# Patient Record
Sex: Female | Born: 1935
Health system: Southern US, Community
[De-identification: ages and names within clinical notes are randomized; demographics above are authoritative.]

## PROBLEM LIST (undated history)

## (undated) DIAGNOSIS — I350 Nonrheumatic aortic (valve) stenosis: Secondary | ICD-10-CM

## (undated) DIAGNOSIS — I471 Supraventricular tachycardia, unspecified: Secondary | ICD-10-CM

## (undated) DIAGNOSIS — I38 Endocarditis, valve unspecified: Secondary | ICD-10-CM

## (undated) DIAGNOSIS — C801 Malignant (primary) neoplasm, unspecified: Secondary | ICD-10-CM

## (undated) DIAGNOSIS — R928 Other abnormal and inconclusive findings on diagnostic imaging of breast: Secondary | ICD-10-CM

## (undated) DIAGNOSIS — R0600 Dyspnea, unspecified: Secondary | ICD-10-CM

## (undated) DIAGNOSIS — F329 Major depressive disorder, single episode, unspecified: Secondary | ICD-10-CM

## (undated) DIAGNOSIS — R053 Chronic cough: Secondary | ICD-10-CM

## (undated) DIAGNOSIS — F419 Anxiety disorder, unspecified: Secondary | ICD-10-CM

## (undated) DIAGNOSIS — K219 Gastro-esophageal reflux disease without esophagitis: Secondary | ICD-10-CM

## (undated) DIAGNOSIS — E785 Hyperlipidemia, unspecified: Secondary | ICD-10-CM

## (undated) DIAGNOSIS — Z860100 Personal history of colon polyps, unspecified: Secondary | ICD-10-CM

## (undated) DIAGNOSIS — R05 Cough: Secondary | ICD-10-CM

## (undated) DIAGNOSIS — R55 Syncope and collapse: Secondary | ICD-10-CM

## (undated) DIAGNOSIS — I5030 Unspecified diastolic (congestive) heart failure: Secondary | ICD-10-CM

## (undated) DIAGNOSIS — I10 Essential (primary) hypertension: Secondary | ICD-10-CM

## (undated) DIAGNOSIS — K259 Gastric ulcer, unspecified as acute or chronic, without hemorrhage or perforation: Secondary | ICD-10-CM

## (undated) DIAGNOSIS — I2510 Atherosclerotic heart disease of native coronary artery without angina pectoris: Secondary | ICD-10-CM

## (undated) DIAGNOSIS — I779 Disorder of arteries and arterioles, unspecified: Secondary | ICD-10-CM

## (undated) DIAGNOSIS — E559 Vitamin D deficiency, unspecified: Secondary | ICD-10-CM

## (undated) DIAGNOSIS — M1990 Unspecified osteoarthritis, unspecified site: Secondary | ICD-10-CM

## (undated) DIAGNOSIS — R062 Wheezing: Secondary | ICD-10-CM

## (undated) DIAGNOSIS — J45909 Unspecified asthma, uncomplicated: Secondary | ICD-10-CM

## (undated) DIAGNOSIS — K5790 Diverticulosis of intestine, part unspecified, without perforation or abscess without bleeding: Secondary | ICD-10-CM

## (undated) DIAGNOSIS — H9190 Unspecified hearing loss, unspecified ear: Secondary | ICD-10-CM

## (undated) DIAGNOSIS — D649 Anemia, unspecified: Secondary | ICD-10-CM

## (undated) DIAGNOSIS — F32A Depression, unspecified: Secondary | ICD-10-CM

## (undated) DIAGNOSIS — C786 Secondary malignant neoplasm of retroperitoneum and peritoneum: Secondary | ICD-10-CM

## (undated) DIAGNOSIS — R945 Abnormal results of liver function studies: Secondary | ICD-10-CM

## (undated) DIAGNOSIS — I351 Nonrheumatic aortic (valve) insufficiency: Secondary | ICD-10-CM

## (undated) DIAGNOSIS — R011 Cardiac murmur, unspecified: Secondary | ICD-10-CM

## (undated) DIAGNOSIS — B029 Zoster without complications: Secondary | ICD-10-CM

## (undated) HISTORY — DX: Disorder of arteries and arterioles, unspecified: I77.9

## (undated) HISTORY — DX: Abnormal results of liver function studies: R94.5

## (undated) HISTORY — PX: APPENDECTOMY: SHX54

## (undated) HISTORY — DX: Cardiac murmur, unspecified: R01.1

## (undated) HISTORY — DX: Unspecified diastolic (congestive) heart failure: I50.30

## (undated) HISTORY — DX: Syncope and collapse: R55

## (undated) HISTORY — DX: Depression, unspecified: F32.A

## (undated) HISTORY — DX: Nonrheumatic aortic (valve) stenosis: I35.0

## (undated) HISTORY — DX: Nonrheumatic aortic (valve) insufficiency: I35.1

## (undated) HISTORY — DX: Essential (primary) hypertension: I10

## (undated) HISTORY — DX: Unspecified osteoarthritis, unspecified site: M19.90

## (undated) HISTORY — DX: Supraventricular tachycardia: I47.1

## (undated) HISTORY — DX: Anxiety disorder, unspecified: F41.9

## (undated) HISTORY — DX: Malignant (primary) neoplasm, unspecified: C80.1

## (undated) HISTORY — DX: Anemia, unspecified: D64.9

## (undated) HISTORY — DX: Gastric ulcer, unspecified as acute or chronic, without hemorrhage or perforation: K25.9

## (undated) HISTORY — PX: CORONARY ANGIOPLASTY: SHX604

## (undated) HISTORY — DX: Personal history of colonic polyps: Z86.010

## (undated) HISTORY — DX: Vitamin D deficiency, unspecified: E55.9

## (undated) HISTORY — DX: Zoster without complications: B02.9

## (undated) HISTORY — DX: Secondary malignant neoplasm of retroperitoneum and peritoneum: C78.6

## (undated) HISTORY — DX: Hyperlipidemia, unspecified: E78.5

## (undated) HISTORY — DX: Major depressive disorder, single episode, unspecified: F32.9

## (undated) HISTORY — DX: Personal history of colon polyps, unspecified: Z86.0100

## (undated) HISTORY — DX: Supraventricular tachycardia, unspecified: I47.10

## (undated) HISTORY — DX: Atherosclerotic heart disease of native coronary artery without angina pectoris: I25.10

## (undated) HISTORY — DX: Gastro-esophageal reflux disease without esophagitis: K21.9

## (undated) HISTORY — DX: Endocarditis, valve unspecified: I38

## (undated) HISTORY — DX: Unspecified asthma, uncomplicated: J45.909

## (undated) HISTORY — DX: Other abnormal and inconclusive findings on diagnostic imaging of breast: R92.8

---
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
## 1985-07-28 HISTORY — PX: CHOLECYSTECTOMY: SHX55

## 1986-07-28 HISTORY — PX: LAPAROSCOPIC BILATERAL SALPINGO OOPHERECTOMY: SHX5890

## 1994-07-28 HISTORY — PX: CORONARY ANGIOPLASTY: SHX604

## 1999-07-29 DIAGNOSIS — C786 Secondary malignant neoplasm of retroperitoneum and peritoneum: Secondary | ICD-10-CM

## 1999-07-29 HISTORY — PX: COLECTOMY: SHX59

## 1999-07-29 HISTORY — DX: Secondary malignant neoplasm of retroperitoneum and peritoneum: C78.6

## 1999-07-29 HISTORY — PX: ABDOMINAL HYSTERECTOMY: SHX81

## 2004-04-27 ENCOUNTER — Ambulatory Visit: Payer: Self-pay | Admitting: Oncology

## 2004-05-28 ENCOUNTER — Ambulatory Visit: Payer: Self-pay | Admitting: Oncology

## 2004-07-01 ENCOUNTER — Emergency Department: Payer: Self-pay | Admitting: Emergency Medicine

## 2004-07-01 ENCOUNTER — Other Ambulatory Visit: Payer: Self-pay

## 2004-08-15 ENCOUNTER — Ambulatory Visit: Payer: Self-pay | Admitting: Oncology

## 2004-08-28 ENCOUNTER — Ambulatory Visit: Payer: Self-pay | Admitting: Oncology

## 2004-10-03 ENCOUNTER — Ambulatory Visit: Payer: Self-pay | Admitting: Oncology

## 2004-10-11 ENCOUNTER — Ambulatory Visit: Payer: Self-pay | Admitting: Oncology

## 2004-10-26 ENCOUNTER — Ambulatory Visit: Payer: Self-pay | Admitting: Oncology

## 2005-02-10 ENCOUNTER — Ambulatory Visit: Payer: Self-pay | Admitting: Oncology

## 2005-02-25 ENCOUNTER — Ambulatory Visit: Payer: Self-pay | Admitting: Oncology

## 2005-05-29 ENCOUNTER — Ambulatory Visit: Payer: Self-pay | Admitting: Oncology

## 2005-06-27 ENCOUNTER — Ambulatory Visit: Payer: Self-pay | Admitting: Oncology

## 2005-07-28 DIAGNOSIS — R928 Other abnormal and inconclusive findings on diagnostic imaging of breast: Secondary | ICD-10-CM

## 2005-07-28 HISTORY — DX: Other abnormal and inconclusive findings on diagnostic imaging of breast: R92.8

## 2005-07-28 HISTORY — PX: CARDIAC CATHETERIZATION: SHX172

## 2005-10-02 ENCOUNTER — Ambulatory Visit: Payer: Self-pay | Admitting: Oncology

## 2005-10-26 ENCOUNTER — Ambulatory Visit: Payer: Self-pay | Admitting: Oncology

## 2005-11-25 ENCOUNTER — Ambulatory Visit: Payer: Self-pay | Admitting: Oncology

## 2005-11-25 ENCOUNTER — Ambulatory Visit: Payer: Self-pay | Admitting: Cardiology

## 2006-02-05 ENCOUNTER — Ambulatory Visit: Payer: Self-pay | Admitting: Oncology

## 2006-02-25 ENCOUNTER — Ambulatory Visit: Payer: Self-pay | Admitting: Oncology

## 2006-05-04 ENCOUNTER — Ambulatory Visit: Payer: Self-pay | Admitting: Surgery

## 2006-05-07 ENCOUNTER — Ambulatory Visit: Payer: Self-pay | Admitting: Oncology

## 2006-05-08 ENCOUNTER — Ambulatory Visit: Payer: Self-pay | Admitting: Surgery

## 2006-05-28 ENCOUNTER — Ambulatory Visit: Payer: Self-pay | Admitting: Oncology

## 2006-07-28 DIAGNOSIS — B029 Zoster without complications: Secondary | ICD-10-CM

## 2006-07-28 HISTORY — DX: Zoster without complications: B02.9

## 2006-09-03 ENCOUNTER — Ambulatory Visit: Payer: Self-pay | Admitting: Internal Medicine

## 2006-09-26 ENCOUNTER — Ambulatory Visit: Payer: Self-pay | Admitting: Internal Medicine

## 2006-10-27 ENCOUNTER — Ambulatory Visit: Payer: Self-pay | Admitting: Internal Medicine

## 2006-11-26 ENCOUNTER — Ambulatory Visit: Payer: Self-pay | Admitting: Internal Medicine

## 2006-12-01 ENCOUNTER — Ambulatory Visit: Payer: Self-pay | Admitting: Oncology

## 2006-12-27 ENCOUNTER — Ambulatory Visit: Payer: Self-pay | Admitting: Oncology

## 2006-12-27 ENCOUNTER — Ambulatory Visit: Payer: Self-pay | Admitting: Internal Medicine

## 2007-03-29 ENCOUNTER — Ambulatory Visit: Payer: Self-pay | Admitting: Internal Medicine

## 2007-03-31 ENCOUNTER — Ambulatory Visit: Payer: Self-pay | Admitting: Internal Medicine

## 2007-04-28 ENCOUNTER — Ambulatory Visit: Payer: Self-pay | Admitting: Internal Medicine

## 2007-05-29 ENCOUNTER — Ambulatory Visit: Payer: Self-pay | Admitting: Internal Medicine

## 2007-06-28 ENCOUNTER — Ambulatory Visit: Payer: Self-pay | Admitting: Internal Medicine

## 2007-06-29 ENCOUNTER — Ambulatory Visit: Payer: Self-pay | Admitting: Internal Medicine

## 2007-07-29 ENCOUNTER — Ambulatory Visit: Payer: Self-pay | Admitting: Internal Medicine

## 2007-09-26 ENCOUNTER — Ambulatory Visit: Payer: Self-pay | Admitting: Internal Medicine

## 2007-09-29 ENCOUNTER — Ambulatory Visit: Payer: Self-pay | Admitting: Internal Medicine

## 2007-10-27 ENCOUNTER — Ambulatory Visit: Payer: Self-pay | Admitting: Internal Medicine

## 2007-11-26 ENCOUNTER — Ambulatory Visit: Payer: Self-pay | Admitting: Internal Medicine

## 2007-12-27 ENCOUNTER — Ambulatory Visit: Payer: Self-pay | Admitting: Internal Medicine

## 2007-12-28 ENCOUNTER — Ambulatory Visit: Payer: Self-pay | Admitting: Internal Medicine

## 2008-01-26 ENCOUNTER — Ambulatory Visit: Payer: Self-pay | Admitting: Internal Medicine

## 2008-05-01 ENCOUNTER — Ambulatory Visit: Payer: Self-pay | Admitting: Internal Medicine

## 2008-05-28 ENCOUNTER — Ambulatory Visit: Payer: Self-pay | Admitting: Internal Medicine

## 2008-06-27 ENCOUNTER — Ambulatory Visit: Payer: Self-pay | Admitting: Internal Medicine

## 2008-07-24 ENCOUNTER — Ambulatory Visit: Payer: Self-pay | Admitting: Internal Medicine

## 2008-07-28 ENCOUNTER — Ambulatory Visit: Payer: Self-pay | Admitting: Internal Medicine

## 2008-07-28 DIAGNOSIS — R7989 Other specified abnormal findings of blood chemistry: Secondary | ICD-10-CM

## 2008-07-28 HISTORY — DX: Other specified abnormal findings of blood chemistry: R79.89

## 2008-12-26 ENCOUNTER — Ambulatory Visit: Payer: Self-pay | Admitting: Internal Medicine

## 2008-12-29 ENCOUNTER — Ambulatory Visit: Payer: Self-pay | Admitting: Internal Medicine

## 2009-01-25 ENCOUNTER — Ambulatory Visit: Payer: Self-pay | Admitting: Internal Medicine

## 2009-06-13 ENCOUNTER — Ambulatory Visit: Payer: Self-pay | Admitting: Internal Medicine

## 2009-07-13 ENCOUNTER — Ambulatory Visit: Payer: Self-pay | Admitting: Unknown Physician Specialty

## 2009-09-26 ENCOUNTER — Ambulatory Visit: Payer: Self-pay | Admitting: Unknown Physician Specialty

## 2009-12-26 ENCOUNTER — Ambulatory Visit: Payer: Self-pay | Admitting: Internal Medicine

## 2009-12-28 ENCOUNTER — Ambulatory Visit: Payer: Self-pay | Admitting: Internal Medicine

## 2010-01-25 ENCOUNTER — Ambulatory Visit: Payer: Self-pay | Admitting: Internal Medicine

## 2010-03-28 ENCOUNTER — Ambulatory Visit: Payer: Self-pay | Admitting: Internal Medicine

## 2010-04-04 ENCOUNTER — Ambulatory Visit: Payer: Self-pay | Admitting: Internal Medicine

## 2010-04-08 LAB — CA 125: CA 125: 37.8 U/mL — ABNORMAL HIGH (ref 0.0–34.0)

## 2010-04-27 ENCOUNTER — Ambulatory Visit: Payer: Self-pay | Admitting: Internal Medicine

## 2010-10-03 ENCOUNTER — Ambulatory Visit: Payer: Self-pay | Admitting: Internal Medicine

## 2010-10-04 LAB — CA 125: CA 125: 35.2 U/mL — ABNORMAL HIGH (ref 0.0–34.0)

## 2010-10-27 ENCOUNTER — Ambulatory Visit: Payer: Self-pay | Admitting: Internal Medicine

## 2011-01-20 ENCOUNTER — Ambulatory Visit: Payer: Self-pay | Admitting: Internal Medicine

## 2011-07-15 ENCOUNTER — Ambulatory Visit: Payer: Self-pay | Admitting: Cardiology

## 2011-08-08 DIAGNOSIS — I2510 Atherosclerotic heart disease of native coronary artery without angina pectoris: Secondary | ICD-10-CM | POA: Diagnosis not present

## 2011-08-08 DIAGNOSIS — E559 Vitamin D deficiency, unspecified: Secondary | ICD-10-CM | POA: Diagnosis not present

## 2011-08-08 DIAGNOSIS — I10 Essential (primary) hypertension: Secondary | ICD-10-CM | POA: Diagnosis not present

## 2011-08-08 DIAGNOSIS — E785 Hyperlipidemia, unspecified: Secondary | ICD-10-CM | POA: Diagnosis not present

## 2011-08-12 ENCOUNTER — Ambulatory Visit: Payer: Self-pay | Admitting: Unknown Physician Specialty

## 2011-08-12 DIAGNOSIS — K5730 Diverticulosis of large intestine without perforation or abscess without bleeding: Secondary | ICD-10-CM | POA: Diagnosis not present

## 2011-08-12 DIAGNOSIS — Z7982 Long term (current) use of aspirin: Secondary | ICD-10-CM | POA: Diagnosis not present

## 2011-08-12 DIAGNOSIS — D649 Anemia, unspecified: Secondary | ICD-10-CM | POA: Diagnosis not present

## 2011-08-12 DIAGNOSIS — Z8711 Personal history of peptic ulcer disease: Secondary | ICD-10-CM | POA: Diagnosis not present

## 2011-08-12 DIAGNOSIS — E559 Vitamin D deficiency, unspecified: Secondary | ICD-10-CM | POA: Diagnosis not present

## 2011-08-12 DIAGNOSIS — Z980 Intestinal bypass and anastomosis status: Secondary | ICD-10-CM | POA: Diagnosis not present

## 2011-08-12 DIAGNOSIS — Z808 Family history of malignant neoplasm of other organs or systems: Secondary | ICD-10-CM | POA: Diagnosis not present

## 2011-08-12 DIAGNOSIS — C786 Secondary malignant neoplasm of retroperitoneum and peritoneum: Secondary | ICD-10-CM | POA: Diagnosis not present

## 2011-08-12 DIAGNOSIS — K633 Ulcer of intestine: Secondary | ICD-10-CM | POA: Diagnosis not present

## 2011-08-12 DIAGNOSIS — Z86010 Personal history of colonic polyps: Secondary | ICD-10-CM | POA: Diagnosis not present

## 2011-08-12 DIAGNOSIS — J45909 Unspecified asthma, uncomplicated: Secondary | ICD-10-CM | POA: Diagnosis not present

## 2011-08-12 DIAGNOSIS — C569 Malignant neoplasm of unspecified ovary: Secondary | ICD-10-CM | POA: Diagnosis not present

## 2011-08-12 DIAGNOSIS — D509 Iron deficiency anemia, unspecified: Secondary | ICD-10-CM | POA: Diagnosis not present

## 2011-08-12 DIAGNOSIS — Z8041 Family history of malignant neoplasm of ovary: Secondary | ICD-10-CM | POA: Diagnosis not present

## 2011-08-12 DIAGNOSIS — I2510 Atherosclerotic heart disease of native coronary artery without angina pectoris: Secondary | ICD-10-CM | POA: Diagnosis not present

## 2011-08-12 DIAGNOSIS — Z79899 Other long term (current) drug therapy: Secondary | ICD-10-CM | POA: Diagnosis not present

## 2011-08-12 DIAGNOSIS — K2980 Duodenitis without bleeding: Secondary | ICD-10-CM | POA: Diagnosis not present

## 2011-08-12 DIAGNOSIS — K648 Other hemorrhoids: Secondary | ICD-10-CM | POA: Diagnosis not present

## 2011-08-12 DIAGNOSIS — I10 Essential (primary) hypertension: Secondary | ICD-10-CM | POA: Diagnosis not present

## 2011-08-12 DIAGNOSIS — K649 Unspecified hemorrhoids: Secondary | ICD-10-CM | POA: Diagnosis not present

## 2011-08-12 DIAGNOSIS — K644 Residual hemorrhoidal skin tags: Secondary | ICD-10-CM | POA: Diagnosis not present

## 2011-08-12 DIAGNOSIS — E785 Hyperlipidemia, unspecified: Secondary | ICD-10-CM | POA: Diagnosis not present

## 2011-08-12 DIAGNOSIS — K449 Diaphragmatic hernia without obstruction or gangrene: Secondary | ICD-10-CM | POA: Diagnosis not present

## 2011-08-12 LAB — HM COLONOSCOPY: HM Colonoscopy: NORMAL

## 2011-08-18 LAB — PATHOLOGY REPORT

## 2011-09-01 DIAGNOSIS — H2510 Age-related nuclear cataract, unspecified eye: Secondary | ICD-10-CM | POA: Diagnosis not present

## 2011-10-02 ENCOUNTER — Ambulatory Visit: Payer: Self-pay | Admitting: Oncology

## 2011-10-02 DIAGNOSIS — I10 Essential (primary) hypertension: Secondary | ICD-10-CM | POA: Diagnosis not present

## 2011-10-02 DIAGNOSIS — Z9221 Personal history of antineoplastic chemotherapy: Secondary | ICD-10-CM | POA: Diagnosis not present

## 2011-10-02 DIAGNOSIS — Z79899 Other long term (current) drug therapy: Secondary | ICD-10-CM | POA: Diagnosis not present

## 2011-10-02 DIAGNOSIS — Z8509 Personal history of malignant neoplasm of other digestive organs: Secondary | ICD-10-CM | POA: Diagnosis not present

## 2011-10-02 DIAGNOSIS — R971 Elevated cancer antigen 125 [CA 125]: Secondary | ICD-10-CM | POA: Diagnosis not present

## 2011-10-02 LAB — CBC CANCER CENTER
Basophil #: 0 x10 3/mm (ref 0.0–0.1)
Basophil %: 0.7 %
Eosinophil #: 0.3 x10 3/mm (ref 0.0–0.7)
Eosinophil %: 6.8 %
HCT: 34.9 % — ABNORMAL LOW (ref 35.0–47.0)
HGB: 11.9 g/dL — ABNORMAL LOW (ref 12.0–16.0)
Lymphocyte #: 1.7 x10 3/mm (ref 1.0–3.6)
Lymphocyte %: 44.1 %
MCH: 31.7 pg (ref 26.0–34.0)
MCHC: 34 g/dL (ref 32.0–36.0)
MCV: 93 fL (ref 80–100)
Monocyte #: 0.2 x10 3/mm (ref 0.0–0.7)
Monocyte %: 6.3 %
Neutrophil #: 1.7 x10 3/mm (ref 1.4–6.5)
Neutrophil %: 42.1 %
Platelet: 176 x10 3/mm (ref 150–440)
RBC: 3.75 10*6/uL — ABNORMAL LOW (ref 3.80–5.20)
RDW: 13.2 % (ref 11.5–14.5)
WBC: 4 x10 3/mm (ref 3.6–11.0)

## 2011-10-02 LAB — COMPREHENSIVE METABOLIC PANEL
Albumin: 4 g/dL (ref 3.4–5.0)
Alkaline Phosphatase: 43 U/L — ABNORMAL LOW (ref 50–136)
Anion Gap: 9 (ref 7–16)
BUN: 14 mg/dL (ref 7–18)
Bilirubin,Total: 0.3 mg/dL (ref 0.2–1.0)
Calcium, Total: 9.5 mg/dL (ref 8.5–10.1)
Chloride: 102 mmol/L (ref 98–107)
Co2: 29 mmol/L (ref 21–32)
Creatinine: 1.01 mg/dL (ref 0.60–1.30)
EGFR (African American): >60
EGFR (Non-African Amer.): 57 — ABNORMAL LOW
Glucose: 94 mg/dL (ref 65–99)
Osmolality: 280 (ref 275–301)
Potassium: 4.4 mmol/L (ref 3.5–5.1)
SGOT(AST): 36 U/L (ref 15–37)
SGPT (ALT): 33 U/L
Sodium: 140 mmol/L (ref 136–145)
Total Protein: 8 g/dL (ref 6.4–8.2)

## 2011-10-03 LAB — CA 125: CA 125: 27.2 U/mL (ref 0.0–34.0)

## 2011-10-20 DIAGNOSIS — Z85828 Personal history of other malignant neoplasm of skin: Secondary | ICD-10-CM | POA: Diagnosis not present

## 2011-10-20 DIAGNOSIS — L821 Other seborrheic keratosis: Secondary | ICD-10-CM | POA: Diagnosis not present

## 2011-10-20 DIAGNOSIS — L820 Inflamed seborrheic keratosis: Secondary | ICD-10-CM | POA: Diagnosis not present

## 2011-10-20 DIAGNOSIS — L570 Actinic keratosis: Secondary | ICD-10-CM | POA: Diagnosis not present

## 2011-10-27 ENCOUNTER — Ambulatory Visit: Payer: Self-pay | Admitting: Oncology

## 2011-10-30 DIAGNOSIS — E782 Mixed hyperlipidemia: Secondary | ICD-10-CM | POA: Diagnosis not present

## 2011-10-30 DIAGNOSIS — I10 Essential (primary) hypertension: Secondary | ICD-10-CM | POA: Diagnosis not present

## 2011-10-30 DIAGNOSIS — I2510 Atherosclerotic heart disease of native coronary artery without angina pectoris: Secondary | ICD-10-CM | POA: Diagnosis not present

## 2011-12-02 DIAGNOSIS — D649 Anemia, unspecified: Secondary | ICD-10-CM | POA: Diagnosis not present

## 2011-12-02 DIAGNOSIS — E559 Vitamin D deficiency, unspecified: Secondary | ICD-10-CM | POA: Diagnosis not present

## 2011-12-02 DIAGNOSIS — I2510 Atherosclerotic heart disease of native coronary artery without angina pectoris: Secondary | ICD-10-CM | POA: Diagnosis not present

## 2011-12-02 DIAGNOSIS — I10 Essential (primary) hypertension: Secondary | ICD-10-CM | POA: Diagnosis not present

## 2011-12-09 DIAGNOSIS — R1013 Epigastric pain: Secondary | ICD-10-CM | POA: Diagnosis not present

## 2011-12-09 DIAGNOSIS — K3189 Other diseases of stomach and duodenum: Secondary | ICD-10-CM | POA: Diagnosis not present

## 2011-12-09 DIAGNOSIS — I2510 Atherosclerotic heart disease of native coronary artery without angina pectoris: Secondary | ICD-10-CM | POA: Diagnosis not present

## 2011-12-09 DIAGNOSIS — I10 Essential (primary) hypertension: Secondary | ICD-10-CM | POA: Diagnosis not present

## 2011-12-09 DIAGNOSIS — E785 Hyperlipidemia, unspecified: Secondary | ICD-10-CM | POA: Diagnosis not present

## 2012-01-21 ENCOUNTER — Ambulatory Visit: Payer: Self-pay | Admitting: Internal Medicine

## 2012-01-21 DIAGNOSIS — Z1231 Encounter for screening mammogram for malignant neoplasm of breast: Secondary | ICD-10-CM | POA: Diagnosis not present

## 2012-02-12 DIAGNOSIS — K3189 Other diseases of stomach and duodenum: Secondary | ICD-10-CM | POA: Diagnosis not present

## 2012-02-12 DIAGNOSIS — I2510 Atherosclerotic heart disease of native coronary artery without angina pectoris: Secondary | ICD-10-CM | POA: Diagnosis not present

## 2012-02-12 DIAGNOSIS — I10 Essential (primary) hypertension: Secondary | ICD-10-CM | POA: Diagnosis not present

## 2012-02-12 DIAGNOSIS — E559 Vitamin D deficiency, unspecified: Secondary | ICD-10-CM | POA: Diagnosis not present

## 2012-02-26 DIAGNOSIS — N390 Urinary tract infection, site not specified: Secondary | ICD-10-CM | POA: Diagnosis not present

## 2012-02-27 DIAGNOSIS — N390 Urinary tract infection, site not specified: Secondary | ICD-10-CM | POA: Diagnosis not present

## 2012-03-10 DIAGNOSIS — J310 Chronic rhinitis: Secondary | ICD-10-CM | POA: Diagnosis not present

## 2012-03-10 DIAGNOSIS — J45909 Unspecified asthma, uncomplicated: Secondary | ICD-10-CM | POA: Diagnosis not present

## 2012-04-13 DIAGNOSIS — Z23 Encounter for immunization: Secondary | ICD-10-CM | POA: Diagnosis not present

## 2012-05-10 DIAGNOSIS — L819 Disorder of pigmentation, unspecified: Secondary | ICD-10-CM | POA: Diagnosis not present

## 2012-05-10 DIAGNOSIS — Z85828 Personal history of other malignant neoplasm of skin: Secondary | ICD-10-CM | POA: Diagnosis not present

## 2012-05-10 DIAGNOSIS — L821 Other seborrheic keratosis: Secondary | ICD-10-CM | POA: Diagnosis not present

## 2012-05-10 DIAGNOSIS — L570 Actinic keratosis: Secondary | ICD-10-CM | POA: Diagnosis not present

## 2012-05-20 DIAGNOSIS — I10 Essential (primary) hypertension: Secondary | ICD-10-CM | POA: Diagnosis not present

## 2012-05-20 DIAGNOSIS — I2510 Atherosclerotic heart disease of native coronary artery without angina pectoris: Secondary | ICD-10-CM | POA: Diagnosis not present

## 2012-07-15 DIAGNOSIS — I10 Essential (primary) hypertension: Secondary | ICD-10-CM | POA: Diagnosis not present

## 2012-07-15 DIAGNOSIS — I2510 Atherosclerotic heart disease of native coronary artery without angina pectoris: Secondary | ICD-10-CM | POA: Diagnosis not present

## 2012-07-15 DIAGNOSIS — E559 Vitamin D deficiency, unspecified: Secondary | ICD-10-CM | POA: Diagnosis not present

## 2012-07-22 DIAGNOSIS — I2510 Atherosclerotic heart disease of native coronary artery without angina pectoris: Secondary | ICD-10-CM | POA: Diagnosis not present

## 2012-07-22 DIAGNOSIS — K3189 Other diseases of stomach and duodenum: Secondary | ICD-10-CM | POA: Diagnosis not present

## 2012-07-22 DIAGNOSIS — R1013 Epigastric pain: Secondary | ICD-10-CM | POA: Diagnosis not present

## 2012-07-22 DIAGNOSIS — I10 Essential (primary) hypertension: Secondary | ICD-10-CM | POA: Diagnosis not present

## 2012-07-22 DIAGNOSIS — E559 Vitamin D deficiency, unspecified: Secondary | ICD-10-CM | POA: Diagnosis not present

## 2012-08-05 DIAGNOSIS — I25810 Atherosclerosis of coronary artery bypass graft(s) without angina pectoris: Secondary | ICD-10-CM | POA: Diagnosis not present

## 2012-09-22 DIAGNOSIS — I2510 Atherosclerotic heart disease of native coronary artery without angina pectoris: Secondary | ICD-10-CM | POA: Diagnosis not present

## 2012-10-07 DIAGNOSIS — H16229 Keratoconjunctivitis sicca, not specified as Sjogren's, unspecified eye: Secondary | ICD-10-CM | POA: Diagnosis not present

## 2012-10-08 DIAGNOSIS — J45909 Unspecified asthma, uncomplicated: Secondary | ICD-10-CM | POA: Diagnosis not present

## 2012-10-08 DIAGNOSIS — J309 Allergic rhinitis, unspecified: Secondary | ICD-10-CM | POA: Diagnosis not present

## 2012-10-28 DIAGNOSIS — I359 Nonrheumatic aortic valve disorder, unspecified: Secondary | ICD-10-CM | POA: Diagnosis not present

## 2012-10-28 DIAGNOSIS — R42 Dizziness and giddiness: Secondary | ICD-10-CM | POA: Diagnosis not present

## 2012-10-28 DIAGNOSIS — I2510 Atherosclerotic heart disease of native coronary artery without angina pectoris: Secondary | ICD-10-CM | POA: Diagnosis not present

## 2013-01-19 DIAGNOSIS — C569 Malignant neoplasm of unspecified ovary: Secondary | ICD-10-CM | POA: Diagnosis not present

## 2013-01-19 DIAGNOSIS — I2510 Atherosclerotic heart disease of native coronary artery without angina pectoris: Secondary | ICD-10-CM | POA: Diagnosis not present

## 2013-01-19 DIAGNOSIS — I10 Essential (primary) hypertension: Secondary | ICD-10-CM | POA: Diagnosis not present

## 2013-01-19 DIAGNOSIS — D649 Anemia, unspecified: Secondary | ICD-10-CM | POA: Diagnosis not present

## 2013-01-26 DIAGNOSIS — K3189 Other diseases of stomach and duodenum: Secondary | ICD-10-CM | POA: Diagnosis not present

## 2013-01-26 DIAGNOSIS — I2510 Atherosclerotic heart disease of native coronary artery without angina pectoris: Secondary | ICD-10-CM | POA: Diagnosis not present

## 2013-01-26 DIAGNOSIS — R1013 Epigastric pain: Secondary | ICD-10-CM | POA: Diagnosis not present

## 2013-01-26 DIAGNOSIS — E559 Vitamin D deficiency, unspecified: Secondary | ICD-10-CM | POA: Diagnosis not present

## 2013-01-26 DIAGNOSIS — I10 Essential (primary) hypertension: Secondary | ICD-10-CM | POA: Diagnosis not present

## 2013-01-31 ENCOUNTER — Encounter: Payer: Self-pay | Admitting: Internal Medicine

## 2013-01-31 DIAGNOSIS — R262 Difficulty in walking, not elsewhere classified: Secondary | ICD-10-CM | POA: Diagnosis not present

## 2013-01-31 DIAGNOSIS — IMO0001 Reserved for inherently not codable concepts without codable children: Secondary | ICD-10-CM | POA: Diagnosis not present

## 2013-01-31 DIAGNOSIS — R279 Unspecified lack of coordination: Secondary | ICD-10-CM | POA: Diagnosis not present

## 2013-02-22 DIAGNOSIS — Z85828 Personal history of other malignant neoplasm of skin: Secondary | ICD-10-CM | POA: Diagnosis not present

## 2013-02-22 DIAGNOSIS — L821 Other seborrheic keratosis: Secondary | ICD-10-CM | POA: Diagnosis not present

## 2013-02-22 DIAGNOSIS — L570 Actinic keratosis: Secondary | ICD-10-CM | POA: Diagnosis not present

## 2013-02-22 DIAGNOSIS — L578 Other skin changes due to chronic exposure to nonionizing radiation: Secondary | ICD-10-CM | POA: Diagnosis not present

## 2013-02-22 DIAGNOSIS — L820 Inflamed seborrheic keratosis: Secondary | ICD-10-CM | POA: Diagnosis not present

## 2013-02-22 DIAGNOSIS — D1800 Hemangioma unspecified site: Secondary | ICD-10-CM | POA: Diagnosis not present

## 2013-02-22 DIAGNOSIS — L819 Disorder of pigmentation, unspecified: Secondary | ICD-10-CM | POA: Diagnosis not present

## 2013-03-10 DIAGNOSIS — J310 Chronic rhinitis: Secondary | ICD-10-CM | POA: Diagnosis not present

## 2013-03-10 DIAGNOSIS — J45909 Unspecified asthma, uncomplicated: Secondary | ICD-10-CM | POA: Diagnosis not present

## 2013-04-08 DIAGNOSIS — Z23 Encounter for immunization: Secondary | ICD-10-CM | POA: Diagnosis not present

## 2013-05-16 DIAGNOSIS — E782 Mixed hyperlipidemia: Secondary | ICD-10-CM | POA: Diagnosis not present

## 2013-05-16 DIAGNOSIS — I10 Essential (primary) hypertension: Secondary | ICD-10-CM | POA: Diagnosis not present

## 2013-05-16 DIAGNOSIS — I2510 Atherosclerotic heart disease of native coronary artery without angina pectoris: Secondary | ICD-10-CM | POA: Diagnosis not present

## 2013-07-29 DIAGNOSIS — E559 Vitamin D deficiency, unspecified: Secondary | ICD-10-CM | POA: Diagnosis not present

## 2013-07-29 DIAGNOSIS — I2510 Atherosclerotic heart disease of native coronary artery without angina pectoris: Secondary | ICD-10-CM | POA: Diagnosis not present

## 2013-07-29 DIAGNOSIS — I10 Essential (primary) hypertension: Secondary | ICD-10-CM | POA: Diagnosis not present

## 2013-08-05 DIAGNOSIS — I2510 Atherosclerotic heart disease of native coronary artery without angina pectoris: Secondary | ICD-10-CM | POA: Diagnosis not present

## 2013-08-05 DIAGNOSIS — E782 Mixed hyperlipidemia: Secondary | ICD-10-CM | POA: Diagnosis not present

## 2013-08-05 DIAGNOSIS — Z0000 Encounter for general adult medical examination without abnormal findings: Secondary | ICD-10-CM | POA: Diagnosis not present

## 2013-08-05 DIAGNOSIS — E559 Vitamin D deficiency, unspecified: Secondary | ICD-10-CM | POA: Diagnosis not present

## 2013-08-05 DIAGNOSIS — Z124 Encounter for screening for malignant neoplasm of cervix: Secondary | ICD-10-CM | POA: Diagnosis not present

## 2013-08-05 DIAGNOSIS — I10 Essential (primary) hypertension: Secondary | ICD-10-CM | POA: Diagnosis not present

## 2013-08-10 ENCOUNTER — Ambulatory Visit: Payer: Self-pay | Admitting: Internal Medicine

## 2013-08-10 DIAGNOSIS — Z1231 Encounter for screening mammogram for malignant neoplasm of breast: Secondary | ICD-10-CM | POA: Diagnosis not present

## 2013-08-10 LAB — HM MAMMOGRAPHY: HM Mammogram: NEGATIVE

## 2013-08-19 DIAGNOSIS — M949 Disorder of cartilage, unspecified: Secondary | ICD-10-CM | POA: Diagnosis not present

## 2013-08-19 DIAGNOSIS — M899 Disorder of bone, unspecified: Secondary | ICD-10-CM | POA: Diagnosis not present

## 2013-08-30 DIAGNOSIS — D1800 Hemangioma unspecified site: Secondary | ICD-10-CM | POA: Diagnosis not present

## 2013-08-30 DIAGNOSIS — Z85828 Personal history of other malignant neoplasm of skin: Secondary | ICD-10-CM | POA: Diagnosis not present

## 2013-08-30 DIAGNOSIS — L819 Disorder of pigmentation, unspecified: Secondary | ICD-10-CM | POA: Diagnosis not present

## 2013-08-30 DIAGNOSIS — L821 Other seborrheic keratosis: Secondary | ICD-10-CM | POA: Diagnosis not present

## 2013-08-30 DIAGNOSIS — L820 Inflamed seborrheic keratosis: Secondary | ICD-10-CM | POA: Diagnosis not present

## 2013-09-05 DIAGNOSIS — I10 Essential (primary) hypertension: Secondary | ICD-10-CM | POA: Diagnosis not present

## 2013-09-05 DIAGNOSIS — I2510 Atherosclerotic heart disease of native coronary artery without angina pectoris: Secondary | ICD-10-CM | POA: Diagnosis not present

## 2013-09-05 DIAGNOSIS — R002 Palpitations: Secondary | ICD-10-CM | POA: Diagnosis not present

## 2013-10-06 DIAGNOSIS — J449 Chronic obstructive pulmonary disease, unspecified: Secondary | ICD-10-CM | POA: Diagnosis not present

## 2013-10-06 DIAGNOSIS — R05 Cough: Secondary | ICD-10-CM | POA: Diagnosis not present

## 2013-10-06 DIAGNOSIS — R059 Cough, unspecified: Secondary | ICD-10-CM | POA: Diagnosis not present

## 2013-10-07 DIAGNOSIS — I2510 Atherosclerotic heart disease of native coronary artery without angina pectoris: Secondary | ICD-10-CM | POA: Diagnosis not present

## 2013-10-07 DIAGNOSIS — I10 Essential (primary) hypertension: Secondary | ICD-10-CM | POA: Diagnosis not present

## 2013-10-07 DIAGNOSIS — R5381 Other malaise: Secondary | ICD-10-CM | POA: Diagnosis not present

## 2013-10-07 DIAGNOSIS — D649 Anemia, unspecified: Secondary | ICD-10-CM | POA: Diagnosis not present

## 2013-10-08 ENCOUNTER — Emergency Department: Payer: Self-pay | Admitting: Emergency Medicine

## 2013-10-08 DIAGNOSIS — F411 Generalized anxiety disorder: Secondary | ICD-10-CM | POA: Diagnosis not present

## 2013-10-08 DIAGNOSIS — E785 Hyperlipidemia, unspecified: Secondary | ICD-10-CM | POA: Diagnosis not present

## 2013-10-08 DIAGNOSIS — Z9089 Acquired absence of other organs: Secondary | ICD-10-CM | POA: Diagnosis not present

## 2013-10-08 DIAGNOSIS — R52 Pain, unspecified: Secondary | ICD-10-CM | POA: Diagnosis not present

## 2013-10-08 DIAGNOSIS — I10 Essential (primary) hypertension: Secondary | ICD-10-CM | POA: Diagnosis not present

## 2013-10-08 DIAGNOSIS — Z90710 Acquired absence of both cervix and uterus: Secondary | ICD-10-CM | POA: Diagnosis not present

## 2013-10-08 DIAGNOSIS — Z79899 Other long term (current) drug therapy: Secondary | ICD-10-CM | POA: Diagnosis not present

## 2013-10-08 DIAGNOSIS — R079 Chest pain, unspecified: Secondary | ICD-10-CM | POA: Diagnosis not present

## 2013-10-08 LAB — BASIC METABOLIC PANEL
Anion Gap: 8 (ref 7–16)
BUN: 15 mg/dL (ref 7–18)
Calcium, Total: 9.2 mg/dL (ref 8.5–10.1)
Chloride: 102 mmol/L (ref 98–107)
Co2: 26 mmol/L (ref 21–32)
Creatinine: 0.72 mg/dL (ref 0.60–1.30)
EGFR (African American): >60
EGFR (Non-African Amer.): >60
Glucose: 101 mg/dL — ABNORMAL HIGH (ref 65–99)
Osmolality: 273 (ref 275–301)
Potassium: 3.9 mmol/L (ref 3.5–5.1)
Sodium: 136 mmol/L (ref 136–145)

## 2013-10-08 LAB — CBC
HCT: 36.5 % (ref 35.0–47.0)
HGB: 12.4 g/dL (ref 12.0–16.0)
MCH: 31.3 pg (ref 26.0–34.0)
MCHC: 33.8 g/dL (ref 32.0–36.0)
MCV: 93 fL (ref 80–100)
Platelet: 154 10*3/uL (ref 150–440)
RBC: 3.95 10*6/uL (ref 3.80–5.20)
RDW: 13.3 % (ref 11.5–14.5)
WBC: 6.2 10*3/uL (ref 3.6–11.0)

## 2013-10-08 LAB — TROPONIN I: Troponin-I: <0.02 ng/mL

## 2013-10-09 LAB — TROPONIN I: Troponin-I: <0.02 ng/mL

## 2013-10-10 DIAGNOSIS — I2510 Atherosclerotic heart disease of native coronary artery without angina pectoris: Secondary | ICD-10-CM | POA: Diagnosis not present

## 2013-10-10 DIAGNOSIS — R0789 Other chest pain: Secondary | ICD-10-CM | POA: Diagnosis not present

## 2013-10-10 DIAGNOSIS — I10 Essential (primary) hypertension: Secondary | ICD-10-CM | POA: Diagnosis not present

## 2013-10-10 DIAGNOSIS — I359 Nonrheumatic aortic valve disorder, unspecified: Secondary | ICD-10-CM | POA: Diagnosis not present

## 2013-10-19 DIAGNOSIS — I359 Nonrheumatic aortic valve disorder, unspecified: Secondary | ICD-10-CM | POA: Diagnosis not present

## 2013-11-03 DIAGNOSIS — R5383 Other fatigue: Secondary | ICD-10-CM | POA: Diagnosis not present

## 2013-11-03 DIAGNOSIS — I10 Essential (primary) hypertension: Secondary | ICD-10-CM | POA: Diagnosis not present

## 2013-11-03 DIAGNOSIS — R5381 Other malaise: Secondary | ICD-10-CM | POA: Diagnosis not present

## 2013-11-03 DIAGNOSIS — I2510 Atherosclerotic heart disease of native coronary artery without angina pectoris: Secondary | ICD-10-CM | POA: Diagnosis not present

## 2013-11-10 DIAGNOSIS — I2510 Atherosclerotic heart disease of native coronary artery without angina pectoris: Secondary | ICD-10-CM | POA: Diagnosis not present

## 2013-11-10 DIAGNOSIS — E559 Vitamin D deficiency, unspecified: Secondary | ICD-10-CM | POA: Diagnosis not present

## 2013-11-10 DIAGNOSIS — I10 Essential (primary) hypertension: Secondary | ICD-10-CM | POA: Diagnosis not present

## 2013-11-10 DIAGNOSIS — D649 Anemia, unspecified: Secondary | ICD-10-CM | POA: Diagnosis not present

## 2013-11-11 ENCOUNTER — Encounter (INDEPENDENT_AMBULATORY_CARE_PROVIDER_SITE_OTHER): Payer: Self-pay

## 2013-11-11 ENCOUNTER — Ambulatory Visit (INDEPENDENT_AMBULATORY_CARE_PROVIDER_SITE_OTHER): Payer: Medicare Other | Admitting: Adult Health

## 2013-11-11 ENCOUNTER — Encounter: Payer: Self-pay | Admitting: Adult Health

## 2013-11-11 VITALS — BP 122/64 | HR 60 | Temp 98.2°F | Resp 14 | Ht 59.5 in | Wt 125.0 lb

## 2013-11-11 DIAGNOSIS — F411 Generalized anxiety disorder: Secondary | ICD-10-CM

## 2013-11-11 DIAGNOSIS — E559 Vitamin D deficiency, unspecified: Secondary | ICD-10-CM | POA: Diagnosis not present

## 2013-11-11 DIAGNOSIS — F419 Anxiety disorder, unspecified: Secondary | ICD-10-CM

## 2013-11-11 MED ORDER — BUSPIRONE HCL 5 MG PO TABS: 5.0000 mg | ORAL_TABLET | Freq: Two times a day (BID) | ORAL | Status: DC

## 2013-11-11 NOTE — Patient Instructions (Signed)
   Thank you for choosing [facility] for your health care needs.  I will request your records from Dr. Caryl Comes and Dr. Josefa Half  Start Buspar 5 mg twice a day. This is for anxiety.   We can increase your Celexa to 40 mg daily to help with depression.

## 2013-11-11 NOTE — Progress Notes (Signed)
Patient ID: Amy Morton, female   DOB: April 09, 1935, 79 y.o.   MRN: [id number]    Subjective:    Patient ID: Amy Morton, female    DOB: 1935-04-07, 79 y.o.   MRN: [id number]  HPI  Pt is a pleasant 79 y/o female who presents to establish care. Previously follow by Dr. Caryl Comes and Dr. Saralyn Pilar. Will request records. She is feeling well overall except for ongoing anxiety. She is on celexa and reports her symptoms have improved although the anxiety persists. She has been prescribed abilify in the past but was not able to tolerate secondary to insomnia. Report having her Medicare wellness exam in January including labs. She reports having a hx of vitamin d deficiency. She has been taking OTC supplements.  [name] reports going to the ED in March for chest pain and elevated blood pressure. She believes it was directly related to taking the abilify. I will request those records.    Past Medical History  Diagnosis Date  . Anxiety   . Arthritis   . Cancer 2001    Peritoneal - Dr. Oliva Bustard  . Depression   . GERD (gastroesophageal reflux disease)   . Hyperlipidemia   . Hypertension   . Ulcer      Past Surgical History  Procedure Laterality Date  . Tonsil removed Bilateral 1950  . Abdominal hysterectomy  1966  . Cholecystectomy  1987  . Angioplasty  1996  . Colon surgery  feb 2001    2nd march 2001  . Laparoscopic bilateral salpingo oopherectomy  1988    Dr. Laverta Baltimore     Family History  Problem Relation Age of Onset  . Hypertension Mother   . Stroke Mother 27    Cerebral Hemorrhage  . Cancer Father     bone cancer  . Diabetes Sister   . Diabetes Brother   . Heart disease Brother   . Cancer Daughter 52    Ovarian cancer     History   Social History  . Marital Status: Widowed    Spouse Name: N/A    Number of Children: 3  . Years of Education: N/A   Occupational History  . [employer]     Retired  . 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 Smoking status: Never Smoker   . Smokeless tobacco: Not on file  . Alcohol Use: No  . Drug Use: No  . Sexual Activity: Not on file   Other Topics Concern  . Not on file   Social History Narrative   Pt lives in [place] by herself. She is widowed and has a daughter [name]), son [name]). She also had a son that was killed in a work related accident [name] - died age).       Caffeine - none   Exercise - walking     Physical exam 08/05/13 Bone density - osteopenia Chest xray 10/06/13 - Echo 10/19/13 ED visit chest pain, elevated b/p 10/08/13  Review of Systems  Constitutional: Negative.   HENT: Negative.   Eyes: Negative.   Respiratory: Negative.   Cardiovascular: Negative.   Gastrointestinal: Negative.   Endocrine: Negative.   Genitourinary: Negative.   Musculoskeletal: Negative.   Skin: Negative.   Allergic/Immunologic: Negative.   Neurological: Negative.   Hematological: Negative.   Psychiatric/Behavioral: Negative for behavioral problems, confusion, sleep disturbance, dysphoric mood, decreased concentration and agitation. The patient is nervous/anxious.  Depression controlled with celexa       Objective:  BP 122/64  Pulse 60  Temp(Src) 98.2 F (36.8 C) (Oral)  Resp 14  Ht 4' 11.5" (1.511 m)  Wt 125 lb (56.7 kg)  BMI 24.83 kg/m2  SpO2 97%   Physical Exam  Constitutional: She is oriented to person, place, and time. No distress.  HENT:  Head: Normocephalic and atraumatic.  Eyes: Conjunctivae and EOM are normal.  Neck: Normal range of motion. Neck supple.  Cardiovascular: Normal rate, regular rhythm, normal heart sounds and intact distal pulses.  Exam reveals no gallop and no friction rub.   No murmur heard. Pulmonary/Chest: Effort normal and breath sounds normal. No respiratory distress. She has no wheezes. She has no rales.  Musculoskeletal: Normal range of motion.  Neurological: She is alert and oriented to person, place, and  time. She has normal reflexes. Coordination normal.  Skin: Skin is warm and dry.  Psychiatric: She has a normal mood and affect. Her behavior is normal. Judgment and thought content normal.       Assessment & Plan:   1. Anxiety Start buspar 5 mg bid. She will return to clinic in 1 month to follow up  2. Vitamin D deficiency Check levels. Continue to follow. - Vit D  25 hydroxy (rtn osteoporosis monitoring)

## 2013-11-11 NOTE — Progress Notes (Signed)
Pre visit review using our clinic review tool, if applicable. No additional management support is needed unless otherwise documented below in the visit note. 

## 2013-11-12 LAB — VITAMIN D 25 HYDROXY (VIT D DEFICIENCY, FRACTURES): Vit D, 25-Hydroxy: 57 ng/mL (ref 30–89)

## 2013-11-13 DIAGNOSIS — F419 Anxiety disorder, unspecified: Secondary | ICD-10-CM | POA: Insufficient documentation

## 2013-11-13 DIAGNOSIS — F32A Depression, unspecified: Secondary | ICD-10-CM | POA: Insufficient documentation

## 2013-11-13 DIAGNOSIS — E559 Vitamin D deficiency, unspecified: Secondary | ICD-10-CM | POA: Insufficient documentation

## 2013-11-14 ENCOUNTER — Encounter: Payer: Self-pay | Admitting: *Deleted

## 2013-11-16 LAB — CBC AND DIFFERENTIAL
Hemoglobin: 12.2 g/dL (ref 12.0–16.0)
Platelets: 175 10*3/uL (ref 150–399)
WBC: 4.5 10^3/mL

## 2013-11-16 LAB — BASIC METABOLIC PANEL
BUN: 13 mg/dL (ref 4–21)
Creatinine: 0.7 mg/dL (ref 0.5–1.1)
Glucose: 93 mg/dL
Potassium: 4.5 mmol/L (ref 3.4–5.3)

## 2013-11-16 LAB — LIPID PANEL
Cholesterol: 158 mg/dL (ref 0–200)
HDL: 50 mg/dL (ref 35–70)
LDL Cholesterol: 57 mg/dL
Triglycerides: 257 mg/dL — AB (ref 40–160)

## 2013-11-16 LAB — TSH: TSH: 1.9 u[IU]/mL (ref 0.41–5.90)

## 2013-11-20 ENCOUNTER — Encounter: Payer: Self-pay | Admitting: Adult Health

## 2013-12-13 ENCOUNTER — Encounter: Payer: Self-pay | Admitting: Adult Health

## 2013-12-13 ENCOUNTER — Ambulatory Visit (INDEPENDENT_AMBULATORY_CARE_PROVIDER_SITE_OTHER): Payer: Medicare Other | Admitting: Adult Health

## 2013-12-13 VITALS — BP 116/64 | HR 59 | Temp 98.1°F | Resp 14 | Wt 124.2 lb

## 2013-12-13 DIAGNOSIS — F411 Generalized anxiety disorder: Secondary | ICD-10-CM

## 2013-12-13 DIAGNOSIS — G479 Sleep disorder, unspecified: Secondary | ICD-10-CM | POA: Diagnosis not present

## 2013-12-13 DIAGNOSIS — F419 Anxiety disorder, unspecified: Secondary | ICD-10-CM

## 2013-12-13 MED ORDER — MIRTAZAPINE 7.5 MG PO TABS: 7.5000 mg | ORAL_TABLET | Freq: Every day | ORAL | Status: DC

## 2013-12-13 NOTE — Patient Instructions (Signed)
  Continue celexa 40 mg daily.  Continue the buspar twice a day.  Start mirtazapine 7.5 mg at bedtime to help you sleep.  Please let me know when you need refills on any of your medications.  You are doing well. Stay busy.

## 2013-12-13 NOTE — Progress Notes (Signed)
Patient ID: Amy Morton, female   DOB: 06-29-1935, 79 y.o.   MRN: [id number]    Subjective:    Patient ID: Amy Morton, female    DOB: [birth year], 79 y.o.   MRN: [id number]  HPI  Pt is a pleasant 79 y/o female who presents for follow up of anxiety. She is on celexa. I had instructed her to increase her celexa to 40 mg daily and she reports her symptoms have improved. She is doing well with the buspar 5 mg bid. Reports that she doesn't sit there and worry all the time. Pt has been working on her garden. Staying active. Having some problems with sleeping. Some nights she falls right to sleep. Other times it takes her a while. Would like something to help with sleep.   Past Medical History  Diagnosis Date  . Anxiety   . Arthritis   . Peritoneal carcinomatosis 2001    Dr. Oliva Bustard & Dr. Claiborne Rigg s/p chemo  . Depression   . GERD (gastroesophageal reflux disease)   . Hyperlipidemia   . Hypertension   . Gastric ulcer   . CAD (coronary artery disease)     s/p angioplasty 1996  . History of colonic polyps     Dr. Vira Agar  . Vitamin D deficiency   . Abnormal mammogram 2007    Recommend close follow up  . Shingles 2008    T-9 distribution  . Elevated LFTs 2010    s/p ultrasound w/ possible fatty liver; GI consult. Resolution 2011  . Aortic stenosis     mild to moderate  . Anemia     Iron, b12, SPEP, UPEP normal, 12/2012  . Valvular heart disease     Mild to Moderate MR, AS    Current Outpatient Prescriptions on File Prior to Visit  Medication Sig Dispense Refill  . acetaminophen (TYLENOL) 650 MG CR tablet Take 650 mg by mouth every 8 (eight) hours as needed for pain.      Marland Kitchen aspirin 81 MG tablet Take 81 mg by mouth daily.      . busPIRone (BUSPAR) 5 MG tablet Take 1 tablet (5 mg total) by mouth 2 (two) times daily.  60 tablet  3  . calcium gluconate 650 MG tablet Take 650 mg by mouth 2 (two) times daily.      . citalopram (CELEXA) 20 MG tablet Take 20 mg by mouth  daily.      . isosorbide dinitrate (ISORDIL) 30 MG tablet Take 30 mg by mouth 2 (two) times daily.      Marland Kitchen lovastatin (MEVACOR) 20 MG tablet Take 20 mg by mouth at bedtime.      . metoprolol (LOPRESSOR) 50 MG tablet Take 50 mg by mouth 2 (two) times daily.      . Multiple Vitamin (MULTIVITAMIN) capsule Take 1 capsule by mouth daily.      Marland Kitchen omega-3 acid ethyl esters (LOVAZA) 1 G capsule Take 2 g by mouth 2 (two) times daily.      . pantoprazole (PROTONIX) 40 MG tablet Take 40 mg by mouth daily.      . potassium chloride (KLOR-CON) 20 MEQ packet Take 20 mEq by mouth once.      Marland Kitchen VITAMIN D, ERGOCALCIFEROL, PO Take 5,000 Units by mouth daily.       No current facility-administered medications on file prior to visit.     Review of Systems  Constitutional: Negative.        Staying active. Working in  her garden  HENT: Negative.   Eyes: Negative.   Respiratory: Negative.   Cardiovascular: Negative.   Gastrointestinal: Negative.   Endocrine: Negative.   Genitourinary: Negative.   Musculoskeletal: Negative.   Skin: Negative.   Allergic/Immunologic: Negative.   Neurological: Negative.   Hematological: Negative.   Psychiatric/Behavioral: Positive for sleep disturbance. Negative for behavioral problems, confusion and agitation. Nervous/anxious: improved with buspar and increasing celexa.   All other systems reviewed and are negative.      Objective:  There were no vitals taken for this visit.   Physical Exam  Constitutional: She is oriented to person, place, and time. No distress.  HENT:  Head: Normocephalic and atraumatic.  Eyes: Conjunctivae and EOM are normal.  Neck: Normal range of motion. Neck supple.  Cardiovascular: Normal rate and regular rhythm.   Pulmonary/Chest: Effort normal. No respiratory distress.  Musculoskeletal: Normal range of motion. She exhibits no edema and no tenderness.  Neurological: She is alert and oriented to person, place, and time. She has normal  reflexes. No cranial nerve deficit. Coordination normal.  Skin: Skin is warm and dry.  Psychiatric: She has a normal mood and affect. Her behavior is normal. Judgment and thought content normal.  Calm demeanor      Assessment & Plan:   1. Anxiety Significantly improved with increasing celexa to 40 mg daily and adding buspar 5 mg bid. Encouraged to stay active. Continue to follow.  2. Sleep disturbance Start mirtazapine 7.5 mg at bedtime. Prescription sent to [facility]. Call with any questions or concerns.

## 2013-12-13 NOTE — Progress Notes (Signed)
Pre visit review using our clinic review tool, if applicable. No additional management support is needed unless otherwise documented below in the visit note. 

## 2014-03-07 ENCOUNTER — Other Ambulatory Visit: Payer: Self-pay | Admitting: Adult Health

## 2014-04-01 ENCOUNTER — Other Ambulatory Visit: Payer: Self-pay | Admitting: Adult Health

## 2014-04-12 ENCOUNTER — Ambulatory Visit (INDEPENDENT_AMBULATORY_CARE_PROVIDER_SITE_OTHER): Payer: Medicare Other | Admitting: Adult Health

## 2014-04-12 ENCOUNTER — Encounter: Payer: Self-pay | Admitting: Adult Health

## 2014-04-12 VITALS — BP 117/71 | HR 64 | Temp 98.4°F | Resp 14 | Wt 131.8 lb

## 2014-04-12 DIAGNOSIS — J069 Acute upper respiratory infection, unspecified: Secondary | ICD-10-CM

## 2014-04-12 DIAGNOSIS — G479 Sleep disorder, unspecified: Secondary | ICD-10-CM

## 2014-04-12 DIAGNOSIS — F411 Generalized anxiety disorder: Secondary | ICD-10-CM

## 2014-04-12 DIAGNOSIS — F419 Anxiety disorder, unspecified: Secondary | ICD-10-CM

## 2014-04-12 MED ORDER — METOPROLOL TARTRATE 50 MG PO TABS: 50.0000 mg | ORAL_TABLET | Freq: Two times a day (BID) | ORAL | Status: DC

## 2014-04-12 MED ORDER — PREDNISONE 10 MG PO TABS: ORAL_TABLET | ORAL | Status: DC

## 2014-04-12 MED ORDER — OMEPRAZOLE 20 MG PO CPDR: 20.0000 mg | DELAYED_RELEASE_CAPSULE | Freq: Two times a day (BID) | ORAL | Status: DC

## 2014-04-12 MED ORDER — MIRTAZAPINE 7.5 MG PO TABS: 7.5000 mg | ORAL_TABLET | Freq: Every day | ORAL | Status: DC

## 2014-04-12 MED ORDER — CITALOPRAM HYDROBROMIDE 20 MG PO TABS: ORAL_TABLET | ORAL | Status: DC

## 2014-04-12 MED ORDER — BUSPIRONE HCL 5 MG PO TABS: 5.0000 mg | ORAL_TABLET | Freq: Two times a day (BID) | ORAL | Status: DC

## 2014-04-12 MED ORDER — AMOXICILLIN-POT CLAVULANATE 875-125 MG PO TABS: 1.0000 | ORAL_TABLET | Freq: Two times a day (BID) | ORAL | Status: DC

## 2014-04-12 MED ORDER — ISOSORBIDE DINITRATE 30 MG PO TABS: 30.0000 mg | ORAL_TABLET | Freq: Two times a day (BID) | ORAL | Status: DC

## 2014-04-12 MED ORDER — LOVASTATIN 20 MG PO TABS: 20.0000 mg | ORAL_TABLET | Freq: Every day | ORAL | Status: DC

## 2014-04-12 MED ORDER — POTASSIUM CHLORIDE 20 MEQ PO PACK: 20.0000 meq | PACK | Freq: Every day | ORAL | Status: DC

## 2014-04-12 NOTE — Progress Notes (Signed)
Pre visit review using our clinic review tool, if applicable. No additional management support is needed unless otherwise documented below in the visit note. 

## 2014-04-12 NOTE — Progress Notes (Signed)
Patient ID: Amy Morton, female   DOB: July 07, 1935, 79 y.o.   MRN: [id number]   Subjective:    Patient ID: Amy Morton, female    DOB: December 27, 1934, 79 y.o.   MRN: [id number]  HPI  Pt is a very pleasant 79 y/o female who presents to clinic for follow up:   1. Anxiety She is doing well with the buspar and celexa. Her dose of celexa was increased temporarily to 40 mg to see if she felt greater improvement. She does not notice much difference so we will go back to 20 mg daily. No side effects reported.  2. Sleep disturbance Overall this is improved. She does have a couple of nights a week when she wakes up and cannot fall back asleep. She reports this happens no more than 3 nights a week. She usually lies in bed a watches TV until she falls back to sleep.  3. She reports having productive cough and this is waking her during the night. Started with sinus drainage and post nasal drip. Does not smoke. No shortness of breath but does feel tightness in her chest with coughing.  Past Medical History  Diagnosis Date  . Anxiety   . Arthritis   . Peritoneal carcinomatosis 2001    Dr. Oliva Bustard & Dr. Claiborne Rigg s/p chemo  . Depression   . GERD (gastroesophageal reflux disease)   . Hyperlipidemia   . Hypertension   . Gastric ulcer   . CAD (coronary artery disease)     s/p angioplasty 1996  . History of colonic polyps     Dr. Vira Agar  . Vitamin D deficiency   . Abnormal mammogram 2007    Recommend close follow up  . Shingles 2008    T-9 distribution  . Elevated LFTs 2010    s/p ultrasound w/ possible fatty liver; GI consult. Resolution 2011  . Aortic stenosis     mild to moderate  . Anemia     Iron, b12, SPEP, UPEP normal, 12/2012  . Valvular heart disease     Mild to Moderate MR, AS    Current Outpatient Prescriptions on File Prior to Visit  Medication Sig Dispense Refill  . acetaminophen (TYLENOL) 650 MG CR tablet Take 650 mg by mouth as needed for pain.       Marland Kitchen aspirin  81 MG tablet Take 81 mg by mouth daily.      . calcium gluconate 650 MG tablet Take 650 mg by mouth 2 (two) times daily.      . Multiple Vitamin (MULTIVITAMIN) capsule Take 1 capsule by mouth daily.      Marland Kitchen omega-3 acid ethyl esters (LOVAZA) 1 G capsule Take 2 g by mouth 2 (two) times daily.      Marland Kitchen VITAMIN D, ERGOCALCIFEROL, PO Take 5,000 Units by mouth daily.       No current facility-administered medications on file prior to visit.     Review of Systems  Constitutional: Negative.   HENT: Positive for congestion and postnasal drip. Negative for rhinorrhea, sinus pressure and sore throat.   Eyes: Negative.   Respiratory: Positive for cough and chest tightness. Negative for shortness of breath and wheezing.   Cardiovascular: Negative.   Gastrointestinal: Negative.   Endocrine: Negative.   Genitourinary: Negative.   Musculoskeletal: Negative.   Skin: Negative.   Allergic/Immunologic: Negative.   Neurological: Negative.   Hematological: Negative.   Psychiatric/Behavioral: Positive for sleep disturbance (stable with medication). Negative for hallucinations, behavioral problems, confusion and  agitation. The patient is nervous/anxious (doing well with medication). The patient is not hyperactive.        Objective:  BP 117/71  Pulse 64  Temp(Src) 98.4 F (36.9 C) (Oral)  Resp 14  Wt 131 lb 12 oz (59.761 kg)  SpO2 95%   Physical Exam  Constitutional: She is oriented to person, place, and time. She appears well-developed and well-nourished. No distress.  HENT:  Head: Normocephalic and atraumatic.  Right Ear: External ear normal.  Left Ear: External ear normal.  Mouth/Throat: No oropharyngeal exudate.  Cardiovascular: Normal rate, regular rhythm and normal heart sounds.  Exam reveals no gallop.   No murmur heard. Pulmonary/Chest: Effort normal. No respiratory distress. She has wheezes. She has no rales.  Rhonchi - improved with coughing. Observed pt with congested cough    Lymphadenopathy:    She has cervical adenopathy.  Neurological: She is alert and oriented to person, place, and time.  Psychiatric: She has a normal mood and affect. Her behavior is normal. Judgment and thought content normal.       Assessment & Plan:   1. Acute upper respiratory infections of unspecified site Start prednisone taper and augmentin bid x 10 days. No distress. She will follow up on Monday with Dr. Derrel Nip to evaluate improvement of lungs  2. Anxiety Doing well with medication - Celexa and Buspar. Refills sent to [facility]. Continue to Follow  3. Sleep disturbance Doing well with medication. Even on the days when she wakes up and cannot go back to sleep, she does not feel stressed or distress about this. Recommend trying warm milk and just relax until she falls back to sleep. Refills on meds sent [facility].

## 2014-04-12 NOTE — Patient Instructions (Addendum)
  Start prednisone taper as follows:  Day #1 - take 6 tablets Day #2 - take 5 tablets Day #3 - take 4 tablets Day #4 - take 3 tablets Day #5 - take 2 tablets Day #6 - take 1 tablet  Start Augmentin 1 tablet twice a day for 10 days.  Prednisone and Augmentin were sent to [facility].  Drink plenty of fluids to maintain hydration. Tylenol for general discomfort or fever.  Return on Monday to see Dr. Derrel Nip for a follow up - upper respiratory infection  Your medications were sent to [facility] for 3 months.

## 2014-04-17 ENCOUNTER — Encounter: Payer: Self-pay | Admitting: Internal Medicine

## 2014-04-17 ENCOUNTER — Ambulatory Visit (INDEPENDENT_AMBULATORY_CARE_PROVIDER_SITE_OTHER): Payer: Medicare Other | Admitting: Internal Medicine

## 2014-04-17 ENCOUNTER — Ambulatory Visit: Payer: Medicare Other | Admitting: Adult Health

## 2014-04-17 VITALS — BP 128/74 | HR 66 | Temp 98.3°F | Resp 16 | Ht 59.5 in | Wt 131.5 lb

## 2014-04-17 DIAGNOSIS — C482 Malignant neoplasm of peritoneum, unspecified: Secondary | ICD-10-CM

## 2014-04-17 DIAGNOSIS — Z859 Personal history of malignant neoplasm, unspecified: Secondary | ICD-10-CM | POA: Insufficient documentation

## 2014-04-17 DIAGNOSIS — I2510 Atherosclerotic heart disease of native coronary artery without angina pectoris: Secondary | ICD-10-CM

## 2014-04-17 DIAGNOSIS — Z23 Encounter for immunization: Secondary | ICD-10-CM

## 2014-04-17 DIAGNOSIS — E785 Hyperlipidemia, unspecified: Secondary | ICD-10-CM | POA: Insufficient documentation

## 2014-04-17 DIAGNOSIS — I350 Nonrheumatic aortic (valve) stenosis: Secondary | ICD-10-CM

## 2014-04-17 DIAGNOSIS — R197 Diarrhea, unspecified: Secondary | ICD-10-CM | POA: Diagnosis not present

## 2014-04-17 DIAGNOSIS — K529 Noninfective gastroenteritis and colitis, unspecified: Secondary | ICD-10-CM | POA: Insufficient documentation

## 2014-04-17 DIAGNOSIS — I359 Nonrheumatic aortic valve disorder, unspecified: Secondary | ICD-10-CM

## 2014-04-17 DIAGNOSIS — J069 Acute upper respiratory infection, unspecified: Secondary | ICD-10-CM

## 2014-04-17 DIAGNOSIS — R5383 Other fatigue: Secondary | ICD-10-CM

## 2014-04-17 DIAGNOSIS — Z9861 Coronary angioplasty status: Secondary | ICD-10-CM

## 2014-04-17 DIAGNOSIS — R5381 Other malaise: Secondary | ICD-10-CM

## 2014-04-17 DIAGNOSIS — Z79899 Other long term (current) drug therapy: Secondary | ICD-10-CM

## 2014-04-17 DIAGNOSIS — E559 Vitamin D deficiency, unspecified: Secondary | ICD-10-CM

## 2014-04-17 MED ORDER — FLORAJEN BIFIDOBLEND PO CAPS: 1.0000 | ORAL_CAPSULE | Freq: Every day | ORAL | Status: DC

## 2014-04-17 MED ORDER — POTASSIUM CHLORIDE CRYS ER 20 MEQ PO TBCR: 20.0000 meq | EXTENDED_RELEASE_TABLET | Freq: Every day | ORAL | Status: DC

## 2014-04-17 NOTE — Progress Notes (Signed)
Patient ID: Amy Morton, female   DOB: Nov 30, 1934, 79 y.o.   MRN: [id number]   Patient Active Problem List   Diagnosis Date Noted  . Acute upper respiratory infections of unspecified site 04/18/2014  . Aortic valve stenosis, acquired 04/18/2014  . CAD S/P percutaneous coronary angioplasty 04/18/2014  . Peritoneal carcinoma 04/17/2014  . Chronic diarrhea 04/17/2014  . Other and unspecified hyperlipidemia 04/17/2014  . CAD in native artery 04/17/2014  . Sleep disturbance 12/13/2013  . Anxiety 11/13/2013  . Vitamin D deficiency 11/13/2013    Subjective:  CC:   Chief Complaint  Patient presents with  . Follow-up    ON URI    HPI:   Amy Morton is a 79 y.o. female who presents for follow up on recent URI treated with augmentin and prednisone on sept 16 by RR.  Patient feels much better,  Her cough has resolved and she is no longer wheezing or having sinus drainage.  She has chronic diarrhea but has not seen any change in stools since antibiotic use.  Follow up on other issues:  Patient has additional history not documented in prior visits, including CAD with prior angioplasty, and remote peritoneal carcinomatosis treated with bowel resection and managed by Dr. Oliva Bustard until of late, as she has been released. She has had chronic diarrhea since her bowel resection.   She has a history of adverse reaction to Abilify which prompted her to change physicians from [facility] Lab and Miquel Dunn to [place] when her reaction went unheeded and unaddressed in follow up (per patient ).  Her symptoms of depression and anxiety are currently well controlled on current regimen.    She has had no recent episodes of chest pain and has not been reassigned to a [facility] health cardiologist   Past Medical History  Diagnosis Date  . Anxiety   . Arthritis   . Peritoneal carcinomatosis 2001    Dr. Oliva Bustard & Dr. Claiborne Rigg s/p chemo  . Depression   . GERD (gastroesophageal reflux disease)    . Hyperlipidemia   . Hypertension   . Gastric ulcer   . CAD (coronary artery disease)     s/p angioplasty 1996  . History of colonic polyps     Dr. Vira Agar  . Vitamin D deficiency   . Abnormal mammogram 2007    Recommend close follow up  . Shingles 2008    T-9 distribution  . Elevated LFTs 2010    s/p ultrasound w/ possible fatty liver; GI consult. Resolution 2011  . Aortic stenosis     mild to moderate  . Anemia     Iron, b12, SPEP, UPEP normal, 12/2012  . Valvular heart disease     Mild to Moderate MR, AS  . Asthma   . aortic stenosis     Past Surgical History  Procedure Laterality Date  . Tonsillectomy  1950  . Cholecystectomy  1987  . Coronary angioplasty  1996  . Colectomy  2001  . Laparoscopic bilateral salpingo oopherectomy  1988    Dr. Laverta Baltimore  . Appendectomy    . Cardiac catheterization  2007    50% mid LAD stenosis, 70% proximal RCA with 100% distal RCA stenosis with collaterals, EF 65%  . Abdominal hysterectomy  2001       The following portions of the patient's history were reviewed and updated as appropriate: Allergies, current medications, and problem list.    Review of Systems:   Patient denies headache, fevers, malaise, unintentional weight  loss, skin rash, eye pain, sinus congestion and sinus pain, sore throat, dysphagia,  hemoptysis , cough, dyspnea, wheezing, chest pain, palpitations, orthopnea, edema, abdominal pain, nausea, melena, diarrhea, constipation, flank pain, dysuria, hematuria, urinary  Frequency, nocturia, numbness, tingling, seizures,  Focal weakness, Loss of consciousness,  Tremor, insomnia, depression, anxiety, and suicidal ideation.     History   Social History  . Marital Status: Widowed    Spouse Name: N/A    Number of Children: 3  . Years of Education: N/A   Occupational History  . [employer]     Retired  . 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 Smoking status: Never Smoker   .  Smokeless tobacco: Not on file  . Alcohol Use: No  . Drug Use: No  . Sexual Activity: Not on file   Other Topics Concern  . Not on file   Social History Narrative   Pt lives in [place] by herself. She is widowed and has a daughter [name]), son [name]). She also had a son that was killed in a work related accident [name] - died age).       Caffeine - none   Exercise - walking    Objective:  Filed Vitals:   04/17/14 1632  BP: 128/74  Pulse: 66  Temp: 98.3 F (36.8 C)  Resp: 16     General appearance: alert, cooperative and appears stated age Ears: normal TM's and external ear canals both ears Throat: lips, mucosa, and tongue normal; teeth and gums normal Neck: no adenopathy, no carotid bruit, supple, symmetrical, trachea midline and thyroid not enlarged, symmetric, no tenderness/mass/nodules Back: symmetric, no curvature. ROM normal. No CVA tenderness. Lungs: clear to auscultation bilaterally Heart: regular rate and rhythm, S1, S2 normal, no murmur, click, rub or gallop Abdomen: soft, non-tender; bowel sounds normal; no masses,  no organomegaly Pulses: 2+ and symmetric Skin: Skin color, texture, turgor normal. No rashes or lesions Lymph nodes: Cervical, supraclavicular, and axillary nodes normal.  Assessment and Plan:  Acute upper respiratory infections of unspecified site Resolving without hypoxia or asthma exacerbation   Chronic diarrhea Iateogeic secondary to colectomy, Checking electrolytes including magnesium and vitamin d. Discussing adding a probiotic  CAD S/P percutaneous coronary angioplasty Patient has known CAD with abnormal myoview resutls in 2012 and no recent interventions.,  She is asymptomatic.  She should be referred to [facility] cardiology for follow up on CAD and aortic stenosis.  Referral made. Labs from [facility] April 2015 entered manually,  She will return for repeat fasting lipids  Other and unspecified hyperlipidemia Goal < 70  Given CAD  history . Hight trigs noted on [facility] labs April ,  Return for repeat  Labs soon.  Currently tolerating statin, fish oil.   Lab Results  Component Value Date   CHOL 158 11/16/2013   HDL 50 11/16/2013   LDLCALC 57 11/16/2013   TRIG 257* 11/16/2013    A total of 40 minutes was spent with patient more than half of which was spent in counseling patient on the above mentioned issues , reviewing and prior records from [place] with patient , and coordination of care.   Updated Medication List Outpatient Encounter Prescriptions as of 04/17/2014  Medication Sig  . acetaminophen (TYLENOL) 650 MG CR tablet Take 650 mg by mouth as needed for pain.   Marland Kitchen amoxicillin-clavulanate (AUGMENTIN) 875-125 MG per tablet Take 1 tablet by mouth 2 (two) times daily.  Marland Kitchen  aspirin 81 MG tablet Take 81 mg by mouth daily.  . busPIRone (BUSPAR) 5 MG tablet Take 1 tablet (5 mg total) by mouth 2 (two) times daily.  . calcium gluconate 650 MG tablet Take 650 mg by mouth 2 (two) times daily.  . citalopram (CELEXA) 20 MG tablet Take 1 tablet daily  . isosorbide dinitrate (ISORDIL) 30 MG tablet Take 1 tablet (30 mg total) by mouth 2 (two) times daily.  Marland Kitchen lovastatin (MEVACOR) 20 MG tablet Take 1 tablet (20 mg total) by mouth at bedtime.  . metoprolol (LOPRESSOR) 50 MG tablet Take 1 tablet (50 mg total) by mouth 2 (two) times daily.  . mirtazapine (REMERON) 7.5 MG tablet Take 1 tablet (7.5 mg total) by mouth at bedtime.  . Multiple Vitamin (MULTIVITAMIN) capsule Take 1 capsule by mouth daily.  Marland Kitchen omega-3 acid ethyl esters (LOVAZA) 1 G capsule Take 2 g by mouth 2 (two) times daily.  Marland Kitchen omeprazole (PRILOSEC) 20 MG capsule Take 1 capsule (20 mg total) by mouth 2 (two) times daily before a meal.  . predniSONE (DELTASONE) 10 MG tablet Take 60 mg (6 tablets) on the first day then decrease by 10 mg (1 tablet) daily until done.  Marland Kitchen VITAMIN D, ERGOCALCIFEROL, PO Take 5,000 Units by mouth daily.  . [DISCONTINUED] potassium chloride  (KLOR-CON) 20 MEQ packet Take 20 mEq by mouth daily.  . potassium chloride SA (K-DUR,KLOR-CON) 20 MEQ tablet Take 1 tablet (20 mEq total) by mouth daily.  . Probiotic Product [facility] BIFIDOBLEND) CAPS Take 1 capsule by mouth daily.     Orders Placed This Encounter  Procedures  . CBC with Differential  . Comprehensive metabolic panel  . Magnesium  . Lipid panel  . TSH  . Vit D  25 hydroxy (rtn osteoporosis monitoring)  . CBC and differential  . Basic metabolic panel  . Lipid panel  . TSH    Return in about 6 months (around 10/16/2014).

## 2014-04-17 NOTE — Patient Instructions (Signed)
I recommend that you take a probiotic ( Align, Floraque or Culturelle) daily while you are on the antibiotic to prevent a serious antibiotic associated diarrhea  Called clostirudium dificile colitis   If it help your chronic diarrhea,  You can stay on it indefinitely  Please return next month for fasting labs  We will set you up with our new nurse practictioner in 6 months

## 2014-04-17 NOTE — Progress Notes (Signed)
Pre-visit discussion using our clinic review tool. No additional management support is needed unless otherwise documented below in the visit note.  

## 2014-04-18 ENCOUNTER — Encounter: Payer: Self-pay | Admitting: Internal Medicine

## 2014-04-18 DIAGNOSIS — D239 Other benign neoplasm of skin, unspecified: Secondary | ICD-10-CM | POA: Diagnosis not present

## 2014-04-18 DIAGNOSIS — Z9861 Coronary angioplasty status: Secondary | ICD-10-CM

## 2014-04-18 DIAGNOSIS — L820 Inflamed seborrheic keratosis: Secondary | ICD-10-CM | POA: Diagnosis not present

## 2014-04-18 DIAGNOSIS — L578 Other skin changes due to chronic exposure to nonionizing radiation: Secondary | ICD-10-CM | POA: Diagnosis not present

## 2014-04-18 DIAGNOSIS — D1800 Hemangioma unspecified site: Secondary | ICD-10-CM | POA: Diagnosis not present

## 2014-04-18 DIAGNOSIS — I350 Nonrheumatic aortic (valve) stenosis: Secondary | ICD-10-CM | POA: Insufficient documentation

## 2014-04-18 DIAGNOSIS — Z85828 Personal history of other malignant neoplasm of skin: Secondary | ICD-10-CM | POA: Diagnosis not present

## 2014-04-18 DIAGNOSIS — L821 Other seborrheic keratosis: Secondary | ICD-10-CM | POA: Diagnosis not present

## 2014-04-18 DIAGNOSIS — I2510 Atherosclerotic heart disease of native coronary artery without angina pectoris: Secondary | ICD-10-CM | POA: Insufficient documentation

## 2014-04-18 DIAGNOSIS — J069 Acute upper respiratory infection, unspecified: Secondary | ICD-10-CM | POA: Insufficient documentation

## 2014-04-18 NOTE — Assessment & Plan Note (Addendum)
Patient has known CAD with abnormal myoview resutls in 2012 and no recent interventions.,  She is asymptomatic.  She should be referred to [facility] cardiology for follow up on CAD and aortic stenosis.  Referral made. Labs from [facility] April 2015 entered manually,  She will return for repeat fasting lipids

## 2014-04-18 NOTE — Assessment & Plan Note (Signed)
Goal < 70  Given CAD history . Hight trigs noted on [place] labs April ,  Return for repeat  Labs soon.  Currently tolerating statin, fish oil.   Lab Results  Component Value Date   CHOL 158 11/16/2013   HDL 50 11/16/2013   LDLCALC 57 11/16/2013   TRIG 257* 11/16/2013

## 2014-04-18 NOTE — Assessment & Plan Note (Addendum)
Checking electrolytes including magnesium and vitamin d. Discussing adding a probiotic

## 2014-04-18 NOTE — Assessment & Plan Note (Signed)
Resolving without hypoxia or asthma exacerbation

## 2014-04-28 ENCOUNTER — Ambulatory Visit (INDEPENDENT_AMBULATORY_CARE_PROVIDER_SITE_OTHER): Payer: Medicare Other | Admitting: Cardiovascular Disease

## 2014-04-28 ENCOUNTER — Encounter: Payer: Self-pay | Admitting: Cardiovascular Disease

## 2014-04-28 VITALS — BP 112/68 | HR 67 | Ht 59.5 in | Wt 132.0 lb

## 2014-04-28 DIAGNOSIS — I2510 Atherosclerotic heart disease of native coronary artery without angina pectoris: Secondary | ICD-10-CM | POA: Diagnosis not present

## 2014-04-28 DIAGNOSIS — I350 Nonrheumatic aortic (valve) stenosis: Secondary | ICD-10-CM

## 2014-04-28 DIAGNOSIS — E785 Hyperlipidemia, unspecified: Secondary | ICD-10-CM | POA: Diagnosis not present

## 2014-04-28 DIAGNOSIS — Z9861 Coronary angioplasty status: Secondary | ICD-10-CM | POA: Diagnosis not present

## 2014-04-28 MED ORDER — CHOLECALCIFEROL 125 MCG (5000 UT) PO CAPS: ORAL_CAPSULE | ORAL | Status: DC

## 2014-04-28 NOTE — Assessment & Plan Note (Signed)
Amy Morton has been stable.  Her last cath from our records was in 2007.  She has moderate CAD in her LAD and an occluded distal RCA. She is active and has not had any episodes of CP .  Continue with her current meds.   I will see her in 6 months with fasting lipids.

## 2014-04-28 NOTE — Progress Notes (Signed)
[name] Date of Birth  03/14/1935       [facility]    [employer] [address], [address], [place] [address], [place]  [address]   [place], [place]  [address] [place]   Fax  [phone]     Fax [phone]  Problem List: 1. CAD - s/p PTCA - 1996  2. Hyperlipidemia 3. Anxiety/depression 4.  History of Present Illness:  Davis is a 79 yo with hx of CAD - s/p PCI in 1996 [name])  She has been seeing Dr. Rosalita Levan.  Wanted to changed cardiologist.  She was recently prescribed Abilify and had a bad reaction. She stated that it made her feel quite bad ( chest burning)  She has stopped the medication and now feels quite a bit better.  She was seen in the ER and had marked HTN.    BP is now better.   She walks daily.   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     Current Outpatient Prescriptions on File Prior to Visit  Medication Sig Dispense Refill  . acetaminophen (TYLENOL) 650 MG CR tablet Take 650 mg by mouth as needed for pain.       Marland Kitchen aspirin 81 MG tablet Take 81 mg by mouth daily.      . busPIRone (BUSPAR) 5 MG tablet Take 1 tablet (5 mg total) by mouth 2 (two) times daily.  180 tablet  3  . calcium gluconate 650 MG tablet Take 650 mg by mouth 2 (two) times daily.      . citalopram (CELEXA) 20 MG tablet Take 1 tablet daily  90 tablet  3  . isosorbide dinitrate (ISORDIL) 30 MG tablet Take 1 tablet (30 mg total) by mouth 2 (two) times daily.  180 tablet  3  . lovastatin (MEVACOR) 20 MG tablet Take 1 tablet (20 mg total) by mouth at bedtime.  90 tablet  3  . metoprolol (LOPRESSOR) 50 MG tablet Take 1 tablet (50 mg total) by mouth 2 (two) times daily.  180 tablet  3  . mirtazapine (REMERON) 7.5 MG tablet Take 1 tablet (7.5 mg total) by mouth at bedtime.  90 tablet  3  . Multiple Vitamin (MULTIVITAMIN) capsule Take 1 capsule by mouth daily.      Marland Kitchen omega-3 acid ethyl esters (LOVAZA) 1 G  capsule Take 2 g by mouth 2 (two) times daily.      Marland Kitchen omeprazole (PRILOSEC) 20 MG capsule Take 1 capsule (20 mg total) by mouth 2 (two) times daily before a meal.  180 capsule  3  . potassium chloride SA (K-DUR,KLOR-CON) 20 MEQ tablet Take 1 tablet (20 mEq total) by mouth daily.  90 tablet  1  . Probiotic Product (FLORAJEN BIFIDOBLEND) CAPS Take 1 capsule by mouth daily.  30 capsule  2  . VITAMIN D, ERGOCALCIFEROL, PO Take 5,000 Units by mouth daily.       No current facility-administered medications on file prior to visit.    Allergies  Allergen Reactions  . Abilify [Aripiprazole] Other (See Comments)    Burning in Chest Elevated b/p Insomnia    Past Medical History  Diagnosis Date  . Anxiety   . Arthritis   . Depression   . GERD (gastroesophageal reflux disease)   . Hyperlipidemia   . Hypertension   . Gastric ulcer   . CAD (coronary artery disease)  s/p angioplasty 1996  . History of colonic polyps     Dr. Vira Agar  . Vitamin D deficiency   . Abnormal mammogram 2007    Recommend close follow up  . Shingles 2008    T-9 distribution  . Elevated LFTs 2010    s/p ultrasound w/ possible fatty liver; GI consult. Resolution 2011  . Aortic stenosis     mild to moderate  . Anemia     Iron, b12, SPEP, UPEP normal, 12/2012  . Valvular heart disease     Mild to Moderate MR, AS  . Asthma   . aortic stenosis   . Peritoneal carcinomatosis 2001    Dr. Oliva Bustard & Dr. Claiborne Rigg s/p chemo    Past Surgical History  Procedure Laterality Date  . Tonsillectomy  1950  . Cholecystectomy  1987  . Colectomy  2001  . Laparoscopic bilateral salpingo oopherectomy  1988    Dr. Laverta Baltimore  . Appendectomy    . Abdominal hysterectomy  2001  . Coronary angioplasty  1996  . Cardiac catheterization  2007    50% mid LAD stenosis, 70% proximal RCA with 100% distal RCA stenosis with collaterals, EF 65%    History  Smoking status  . Never Smoker   Smokeless tobacco  . Not on file     History  Alcohol Use No    Family History  Problem Relation Age of Onset  . Hypertension Mother   . Stroke Mother 2    Cerebral Hemorrhage  . Cancer Father     bone cancer  . Diabetes Sister   . Diabetes Brother   . Heart disease Brother   . Cancer Daughter 43    Ovarian cancer    Reviw of Systems:  Reviewed in the HPI.  All other systems are negative.  Physical Exam: Blood pressure 112/68, pulse 67, height 4' 11.5" (1.511 m), weight 132 lb (59.875 kg). Wt Readings from Last 3 Encounters:  04/28/14 132 lb (59.875 kg)  04/17/14 131 lb 8 oz (59.648 kg)  04/12/14 131 lb 12 oz (59.761 kg)     General: Well developed, well nourished, in no acute distress.  Head: Normocephalic, atraumatic, sclera non-icteric, mucus membranes are moist,   Neck: Supple. Carotids are 2 + without bruits. No JVD .  There is radiation of her systolic murmur into her carotids.  Lungs: Clear   Heart: RR, normal S1S2. Soft systolic murmur  Abdomen: Soft, non-tender, non-distended with normal bowel sounds.  Msk:  Strength and tone are normal   Extremities: No clubbing or cyanosis. No edema.  Distal pedal pulses are 2+ and equal    Neuro: CN II - XII intact.  Alert and oriented X 3.   Psych:  Normal   ECG: 04/28/2049: Normal sinus rhythm at 67 beats a minute. She has no ST or T wave changes.  Assessment / Plan:

## 2014-04-28 NOTE — Patient Instructions (Addendum)
Your physician has recommended you make the following change in your medication:  Change Vitamin D to every other day   Your physician wants you to follow-up in: 6 months with Dr. Acie Fredrickson. You will receive a reminder letter in the mail two months in advance. If you don't receive a letter, please call our office to schedule the follow-up appointment.  Your physician recommends that you return for lab work in:  Lipid, Liver and BMP at your 6 month visit

## 2014-05-10 ENCOUNTER — Other Ambulatory Visit (INDEPENDENT_AMBULATORY_CARE_PROVIDER_SITE_OTHER): Payer: Medicare Other

## 2014-05-10 DIAGNOSIS — Z79899 Other long term (current) drug therapy: Secondary | ICD-10-CM | POA: Diagnosis not present

## 2014-05-10 DIAGNOSIS — K529 Noninfective gastroenteritis and colitis, unspecified: Secondary | ICD-10-CM

## 2014-05-10 DIAGNOSIS — R5381 Other malaise: Secondary | ICD-10-CM

## 2014-05-10 DIAGNOSIS — R5383 Other fatigue: Secondary | ICD-10-CM

## 2014-05-10 DIAGNOSIS — E559 Vitamin D deficiency, unspecified: Secondary | ICD-10-CM

## 2014-05-10 DIAGNOSIS — R7301 Impaired fasting glucose: Secondary | ICD-10-CM

## 2014-05-10 DIAGNOSIS — E785 Hyperlipidemia, unspecified: Secondary | ICD-10-CM | POA: Diagnosis not present

## 2014-05-10 DIAGNOSIS — D649 Anemia, unspecified: Secondary | ICD-10-CM

## 2014-05-10 LAB — COMPREHENSIVE METABOLIC PANEL
ALT: 25 U/L (ref 0–35)
AST: 41 U/L — ABNORMAL HIGH (ref 0–37)
Albumin: 3.3 g/dL — ABNORMAL LOW (ref 3.5–5.2)
Alkaline Phosphatase: 38 U/L — ABNORMAL LOW (ref 39–117)
BUN: 13 mg/dL (ref 6–23)
CO2: 23 mEq/L (ref 19–32)
Calcium: 9.1 mg/dL (ref 8.4–10.5)
Chloride: 105 mEq/L (ref 96–112)
Creatinine, Ser: 0.8 mg/dL (ref 0.4–1.2)
GFR: 70.84 mL/min (ref >60.00)
Glucose, Bld: 110 mg/dL — ABNORMAL HIGH (ref 70–99)
Potassium: 4.2 mEq/L (ref 3.5–5.1)
Sodium: 139 mEq/L (ref 135–145)
Total Bilirubin: 0.7 mg/dL (ref 0.2–1.2)
Total Protein: 7.3 g/dL (ref 6.0–8.3)

## 2014-05-10 LAB — CBC WITH DIFFERENTIAL/PLATELET
Basophils Absolute: 0 10*3/uL (ref 0.0–0.1)
Basophils Relative: 0.8 % (ref 0.0–3.0)
Eosinophils Absolute: 0.4 10*3/uL (ref 0.0–0.7)
Eosinophils Relative: 10 % — ABNORMAL HIGH (ref 0.0–5.0)
HCT: 36.1 % (ref 36.0–46.0)
Hemoglobin: 11.7 g/dL — ABNORMAL LOW (ref 12.0–15.0)
Lymphocytes Relative: 43 % (ref 12.0–46.0)
Lymphs Abs: 1.9 10*3/uL (ref 0.7–4.0)
MCHC: 32.4 g/dL (ref 30.0–36.0)
MCV: 93.4 fl (ref 78.0–100.0)
Monocytes Absolute: 0.4 10*3/uL (ref 0.1–1.0)
Monocytes Relative: 9.3 % (ref 3.0–12.0)
Neutro Abs: 1.6 10*3/uL (ref 1.4–7.7)
Neutrophils Relative %: 36.9 % — ABNORMAL LOW (ref 43.0–77.0)
Platelets: 150 10*3/uL (ref 150.0–400.0)
RBC: 3.86 Mil/uL — ABNORMAL LOW (ref 3.87–5.11)
RDW: 14.4 % (ref 11.5–15.5)
WBC: 4.4 10*3/uL (ref 4.0–10.5)

## 2014-05-10 LAB — VITAMIN D 25 HYDROXY (VIT D DEFICIENCY, FRACTURES): VITD: 62.3 ng/mL (ref 30.00–100.00)

## 2014-05-10 LAB — LIPID PANEL
Cholesterol: 144 mg/dL (ref 0–200)
HDL: 40.2 mg/dL (ref >39.00)
LDL Cholesterol: 88 mg/dL (ref 0–99)
NonHDL: 103.8
Total CHOL/HDL Ratio: 4
Triglycerides: 79 mg/dL (ref 0.0–149.0)
VLDL: 15.8 mg/dL (ref 0.0–40.0)

## 2014-05-10 LAB — TSH: TSH: 1.71 u[IU]/mL (ref 0.35–4.50)

## 2014-05-10 LAB — MAGNESIUM: Magnesium: 1.5 mg/dL (ref 1.5–2.5)

## 2014-05-12 DIAGNOSIS — Z79899 Other long term (current) drug therapy: Secondary | ICD-10-CM | POA: Insufficient documentation

## 2014-05-12 DIAGNOSIS — D649 Anemia, unspecified: Secondary | ICD-10-CM | POA: Insufficient documentation

## 2014-05-12 NOTE — Addendum Note (Signed)
Addended by: Crecencio Mc on: 05/12/2014 08:06 AM   Modules accepted: Orders

## 2014-05-15 ENCOUNTER — Other Ambulatory Visit (INDEPENDENT_AMBULATORY_CARE_PROVIDER_SITE_OTHER): Payer: Medicare Other

## 2014-05-15 DIAGNOSIS — Z79899 Other long term (current) drug therapy: Secondary | ICD-10-CM | POA: Diagnosis not present

## 2014-05-15 DIAGNOSIS — R7301 Impaired fasting glucose: Secondary | ICD-10-CM | POA: Diagnosis not present

## 2014-05-15 DIAGNOSIS — D649 Anemia, unspecified: Secondary | ICD-10-CM

## 2014-05-15 LAB — HEMOGLOBIN A1C: Hgb A1c MFr Bld: 6.2 % (ref 4.6–6.5)

## 2014-05-15 LAB — IBC PANEL
Iron: 83 ug/dL (ref 42–145)
Saturation Ratios: 17.6 % — ABNORMAL LOW (ref 20.0–50.0)
Transferrin: 337.7 mg/dL (ref 212.0–360.0)

## 2014-05-15 LAB — HEPATIC FUNCTION PANEL
ALT: 27 U/L (ref 0–35)
AST: 40 U/L — ABNORMAL HIGH (ref 0–37)
Albumin: 3.3 g/dL — ABNORMAL LOW (ref 3.5–5.2)
Alkaline Phosphatase: 45 U/L (ref 39–117)
Bilirubin, Direct: 0.1 mg/dL (ref 0.0–0.3)
Total Bilirubin: 0.5 mg/dL (ref 0.2–1.2)
Total Protein: 7.3 g/dL (ref 6.0–8.3)

## 2014-05-15 LAB — VITAMIN B12: Vitamin B-12: 288 pg/mL (ref 211–911)

## 2014-05-16 LAB — FOLATE RBC: RBC Folate: 1148 ng/mL (ref >280)

## 2014-05-18 ENCOUNTER — Encounter: Payer: Self-pay | Admitting: *Deleted

## 2014-05-18 MED ORDER — CYANOCOBALAMIN 1000 MCG SL SUBL: 1.0000 | SUBLINGUAL_TABLET | Freq: Every day | SUBLINGUAL | Status: DC

## 2014-05-18 NOTE — Addendum Note (Signed)
Addended by: Crecencio Mc on: 05/18/2014 07:03 AM   Modules accepted: Orders

## 2014-05-24 ENCOUNTER — Other Ambulatory Visit: Payer: Medicare Other

## 2014-05-25 ENCOUNTER — Other Ambulatory Visit (INDEPENDENT_AMBULATORY_CARE_PROVIDER_SITE_OTHER): Payer: Medicare Other

## 2014-05-25 ENCOUNTER — Other Ambulatory Visit: Payer: Self-pay | Admitting: *Deleted

## 2014-05-25 DIAGNOSIS — Z1211 Encounter for screening for malignant neoplasm of colon: Secondary | ICD-10-CM | POA: Diagnosis not present

## 2014-05-25 DIAGNOSIS — R195 Other fecal abnormalities: Secondary | ICD-10-CM

## 2014-05-25 LAB — FECAL OCCULT BLOOD, IMMUNOCHEMICAL: Fecal Occult Bld: POSITIVE — AB

## 2014-07-07 ENCOUNTER — Ambulatory Visit: Payer: Self-pay

## 2014-07-07 ENCOUNTER — Ambulatory Visit (INDEPENDENT_AMBULATORY_CARE_PROVIDER_SITE_OTHER): Payer: Medicare Other | Admitting: Nurse Practitioner

## 2014-07-07 ENCOUNTER — Encounter: Payer: Self-pay | Admitting: Nurse Practitioner

## 2014-07-07 VITALS — BP 142/79 | HR 66 | Temp 98.0°F | Resp 12 | Ht 59.5 in | Wt 134.8 lb

## 2014-07-07 DIAGNOSIS — I2510 Atherosclerotic heart disease of native coronary artery without angina pectoris: Secondary | ICD-10-CM

## 2014-07-07 DIAGNOSIS — M25461 Effusion, right knee: Secondary | ICD-10-CM | POA: Diagnosis not present

## 2014-07-07 DIAGNOSIS — M1711 Unilateral primary osteoarthritis, right knee: Secondary | ICD-10-CM | POA: Diagnosis not present

## 2014-07-07 DIAGNOSIS — M25561 Pain in right knee: Secondary | ICD-10-CM

## 2014-07-07 MED ORDER — FLORAJEN BIFIDOBLEND PO CAPS: 1.0000 | ORAL_CAPSULE | Freq: Every day | ORAL | Status: DC

## 2014-07-07 NOTE — Progress Notes (Signed)
Subjective:    Patient ID: Amy Morton, female    DOB: 09/10/1934, 79 y.o.   MRN: [id number]  HPI  [name] is a 79 yo female with CC of right knee pain denies trauma  1) Right knee pain 3-4 weeks. Denies trauma, but did have effusion of right knee several years past.  Worse getting up and down out of seated position. Careful when lying down  Throbbing sitting , sharp when standing  Best 7/10 Worst 9/10.  Better when gets up in morning.  Tylenol Arthritis- helps  Icy/Hot- not helpful    Review of Systems  Constitutional: Negative for fever, chills, diaphoresis and fatigue.  Musculoskeletal: Positive for arthralgias and gait problem. Negative for myalgias and joint swelling.       Right knee  Skin: Negative for color change, rash and wound.  Neurological: Negative for dizziness, tremors, weakness, numbness and headaches.   Past Medical History  Diagnosis Date  . Anxiety   . Arthritis   . Depression   . GERD (gastroesophageal reflux disease)   . Hyperlipidemia   . Hypertension   . Gastric ulcer   . CAD (coronary artery disease)     s/p angioplasty 1996  . History of colonic polyps     Dr. Vira Agar  . Vitamin D deficiency   . Abnormal mammogram 2007    Recommend close follow up  . Shingles 2008    T-9 distribution  . Elevated LFTs 2010    s/p ultrasound w/ possible fatty liver; GI consult. Resolution 2011  . Aortic stenosis     mild to moderate  . Anemia     Iron, b12, SPEP, UPEP normal, 12/2012  . Valvular heart disease     Mild to Moderate MR, AS  . Asthma   . aortic stenosis   . Peritoneal carcinomatosis 2001    Dr. Oliva Bustard & Dr. Claiborne Rigg s/p chemo    History   Social History  . Marital Status: Widowed    Spouse Name: N/A    Number of Children: 3  . Years of Education: N/A   Occupational History  . [employer]     Retired  . 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 Smoking status: Never Smoker   .  Smokeless tobacco: Not on file  . Alcohol Use: No  . Drug Use: No  . Sexual Activity: Not on file   Other Topics Concern  . Not on file   Social History Narrative   Pt lives in [place] by herself. She is widowed and has a daughter Amy Morton), son Amy Morton). She also had a son that was killed in a work related accident Amy Morton - died age).       Caffeine - none   Exercise - walking    Past Surgical History  Procedure Laterality Date  . Tonsillectomy  1950  . Cholecystectomy  1987  . Colectomy  2001  . Laparoscopic bilateral salpingo oopherectomy  1988    Dr. Laverta Baltimore  . Appendectomy    . Abdominal hysterectomy  2001  . Coronary angioplasty  1996  . Cardiac catheterization  2007    50% mid LAD stenosis, 70% proximal RCA with 100% distal RCA stenosis with collaterals, EF 65%    Family History  Problem Relation Age of Onset  . Hypertension Mother   . Stroke Mother 59    Cerebral Hemorrhage  . Cancer Father  bone cancer  . Diabetes Sister   . Diabetes Brother   . Heart disease Brother   . Cancer Daughter 75    Ovarian cancer    Allergies  Allergen Reactions  . Abilify [Aripiprazole] Other (See Comments)    Burning in Chest Elevated b/p Insomnia    Current Outpatient Prescriptions on File Prior to Visit  Medication Sig Dispense Refill  . acetaminophen (TYLENOL) 650 MG CR tablet Take 650 mg by mouth as needed for pain.     Marland Kitchen aspirin 81 MG tablet Take 81 mg by mouth daily.    . busPIRone (BUSPAR) 5 MG tablet Take 1 tablet (5 mg total) by mouth 2 (two) times daily. 180 tablet 3  . calcium gluconate 650 MG tablet Take 650 mg by mouth 2 (two) times daily.    . Cholecalciferol ([employer] VIT D 5000 HIGH-POTENCY) 5000 UNITS capsule One capsule every other day (Patient taking differently: Take 5,000 Units by mouth every other day. One capsule every other day)    . citalopram (CELEXA) 20 MG tablet Take 1 tablet daily 90 tablet 3  . isosorbide dinitrate (ISORDIL) 30 MG tablet Take 1  tablet (30 mg total) by mouth 2 (two) times daily. 180 tablet 3  . lovastatin (MEVACOR) 20 MG tablet Take 1 tablet (20 mg total) by mouth at bedtime. 90 tablet 3  . metoprolol (LOPRESSOR) 50 MG tablet Take 1 tablet (50 mg total) by mouth 2 (two) times daily. 180 tablet 3  . mirtazapine (REMERON) 7.5 MG tablet Take 1 tablet (7.5 mg total) by mouth at bedtime. 90 tablet 3  . Multiple Vitamin (MULTIVITAMIN) capsule Take 1 capsule by mouth daily.    Marland Kitchen omega-3 acid ethyl esters (LOVAZA) 1 G capsule Take 2 g by mouth 2 (two) times daily.    Marland Kitchen omeprazole (PRILOSEC) 20 MG capsule Take 1 capsule (20 mg total) by mouth 2 (two) times daily before a meal. 180 capsule 3  . potassium chloride SA (K-DUR,KLOR-CON) 20 MEQ tablet Take 1 tablet (20 mEq total) by mouth daily. 90 tablet 1   No current facility-administered medications on file prior to visit.       Objective:   Physical Exam  Constitutional: She is oriented to person, place, and time. She appears well-developed and well-nourished. No distress.  Cardiovascular: Normal rate and regular rhythm.   Pulmonary/Chest: Effort normal and breath sounds normal.  Musculoskeletal: She exhibits tenderness. She exhibits no edema.  Tenderness to palpation of medial knee, right patella looks to be tracking off to lateral side, negative for bruising, effusion, redness, warmth, nor ballottement of knee cap on right.   Neurological: She is alert and oriented to person, place, and time. She displays normal reflexes. No cranial nerve deficit. She exhibits normal muscle tone. Coordination abnormal.  Gait is slow and limping because of right knee pain  Skin: Skin is warm and dry. No rash noted.  Psychiatric: She has a normal mood and affect. Her behavior is normal. Judgment and thought content normal.    BP 142/79 mmHg  Pulse 66  Temp(Src) 98 F (36.7 C)  Resp 12  Ht 4' 11.5" (1.511 m)  Wt 134 lb 12.8 oz (61.145 kg)  BMI 26.78 kg/m2  SpO2 96%         Assessment & Plan:

## 2014-07-07 NOTE — Progress Notes (Signed)
Pre visit review using our clinic review tool, if applicable. No additional management support is needed unless otherwise documented below in the visit note. 

## 2014-07-07 NOTE — Patient Instructions (Signed)

## 2014-07-09 DIAGNOSIS — M25561 Pain in right knee: Secondary | ICD-10-CM | POA: Insufficient documentation

## 2014-07-09 NOTE — Assessment & Plan Note (Signed)
New onset. X-ray order for [facility] 4 view Right knee. Instructed to continue with icing knee, use of Tylenol Arthritis, and FU after x-ray. Will decide plan of action from there.   Gave handout of OA more for warning signs, when to seek care, and home care instructions.

## 2014-07-12 ENCOUNTER — Telehealth: Payer: Self-pay | Admitting: Nurse Practitioner

## 2014-07-12 NOTE — Telephone Encounter (Signed)
Please call Amy Morton with Results of Right Knee X-ray. "No fracture or acute finding, Minor Osteoarthritis, small joint effusion". We can refer to ortho if she would like. Thanks.

## 2014-07-13 NOTE — Telephone Encounter (Signed)
Called and advised patient of results. Will call back if wants ortho referral, will continue home treatment now.

## 2014-07-14 ENCOUNTER — Telehealth: Payer: Self-pay | Admitting: Nurse Practitioner

## 2014-07-14 NOTE — Telephone Encounter (Signed)
The patient's insurance will only cover 1 capsule by mouth 1 time per day. The patient is only taking 1 tablet a day. She is asking for a new prescription to be called into the pharmacy.  omeprazole (PRILOSEC) 20 MG capsule Take 1 capsule (20 mg total) by mouth 2 (two) times daily before a meal

## 2014-07-14 NOTE — Telephone Encounter (Signed)
Please review previous message and advise. Thanks.

## 2014-07-17 ENCOUNTER — Other Ambulatory Visit: Payer: Self-pay | Admitting: Nurse Practitioner

## 2014-07-17 DIAGNOSIS — M25561 Pain in right knee: Secondary | ICD-10-CM

## 2014-07-17 MED ORDER — OMEPRAZOLE 20 MG PO CPDR: 20.0000 mg | DELAYED_RELEASE_CAPSULE | Freq: Every day | ORAL | Status: DC

## 2014-07-17 NOTE — Telephone Encounter (Signed)
Will do a ortho referral. Thanks!

## 2014-07-17 NOTE — Telephone Encounter (Signed)
Spoke to patient to notify her of new Rx. Patient stated that [facility] Hummingbird told her to call if she is still having knee pain. Patient said the pain is still there and it is unbearable. She waned to know if [facility]y Hummingbird did Cortisone shots here or she would like a referral to see ortho. Please advise.

## 2014-07-17 NOTE — Telephone Encounter (Signed)
Patient notified

## 2014-07-17 NOTE — Telephone Encounter (Signed)
I sent in the revised prescription. Thanks!

## 2014-07-26 DIAGNOSIS — M1711 Unilateral primary osteoarthritis, right knee: Secondary | ICD-10-CM | POA: Diagnosis not present

## 2014-07-26 DIAGNOSIS — S83231A Complex tear of medial meniscus, current injury, right knee, initial encounter: Secondary | ICD-10-CM | POA: Insufficient documentation

## 2014-07-26 DIAGNOSIS — M23303 Other meniscus derangements, unspecified medial meniscus, right knee: Secondary | ICD-10-CM | POA: Diagnosis not present

## 2014-09-25 DIAGNOSIS — S83231D Complex tear of medial meniscus, current injury, right knee, subsequent encounter: Secondary | ICD-10-CM | POA: Diagnosis not present

## 2014-09-25 DIAGNOSIS — M1711 Unilateral primary osteoarthritis, right knee: Secondary | ICD-10-CM | POA: Diagnosis not present

## 2014-10-11 ENCOUNTER — Ambulatory Visit (INDEPENDENT_AMBULATORY_CARE_PROVIDER_SITE_OTHER): Payer: Medicare Other | Admitting: Nurse Practitioner

## 2014-10-11 ENCOUNTER — Encounter: Payer: Self-pay | Admitting: Nurse Practitioner

## 2014-10-11 VITALS — BP 138/80 | HR 68 | Temp 98.1°F | Ht 59.5 in | Wt 138.2 lb

## 2014-10-11 DIAGNOSIS — R945 Abnormal results of liver function studies: Secondary | ICD-10-CM

## 2014-10-11 DIAGNOSIS — D649 Anemia, unspecified: Secondary | ICD-10-CM | POA: Diagnosis not present

## 2014-10-11 DIAGNOSIS — M25561 Pain in right knee: Secondary | ICD-10-CM

## 2014-10-11 DIAGNOSIS — R7989 Other specified abnormal findings of blood chemistry: Secondary | ICD-10-CM | POA: Diagnosis not present

## 2014-10-11 DIAGNOSIS — Z130 Encounter for screening for diseases of the blood and blood-forming organs and certain disorders involving the immune mechanism: Secondary | ICD-10-CM | POA: Diagnosis not present

## 2014-10-11 LAB — COMPREHENSIVE METABOLIC PANEL
ALT: 25 U/L (ref 0–35)
AST: 40 U/L — ABNORMAL HIGH (ref 0–37)
Albumin: 4.2 g/dL (ref 3.5–5.2)
Alkaline Phosphatase: 57 U/L (ref 39–117)
BUN: 14 mg/dL (ref 6–23)
CO2: 28 mEq/L (ref 19–32)
Calcium: 9.7 mg/dL (ref 8.4–10.5)
Chloride: 101 mEq/L (ref 96–112)
Creatinine, Ser: 0.71 mg/dL (ref 0.40–1.20)
GFR: 84.73 mL/min (ref >60.00)
Glucose, Bld: 106 mg/dL — ABNORMAL HIGH (ref 70–99)
Potassium: 4.9 mEq/L (ref 3.5–5.1)
Sodium: 136 mEq/L (ref 135–145)
Total Bilirubin: 0.4 mg/dL (ref 0.2–1.2)
Total Protein: 7 g/dL (ref 6.0–8.3)

## 2014-10-11 LAB — CBC WITH DIFFERENTIAL/PLATELET
Basophils Absolute: 0 10*3/uL (ref 0.0–0.1)
Basophils Relative: 0.4 % (ref 0.0–3.0)
Eosinophils Absolute: 0.3 10*3/uL (ref 0.0–0.7)
Eosinophils Relative: 4.4 % (ref 0.0–5.0)
HCT: 34.8 % — ABNORMAL LOW (ref 36.0–46.0)
Hemoglobin: 11.6 g/dL — ABNORMAL LOW (ref 12.0–15.0)
Lymphocytes Relative: 30.3 % (ref 12.0–46.0)
Lymphs Abs: 1.8 10*3/uL (ref 0.7–4.0)
MCHC: 33.3 g/dL (ref 30.0–36.0)
MCV: 88.2 fl (ref 78.0–100.0)
Monocytes Absolute: 0.6 10*3/uL (ref 0.1–1.0)
Monocytes Relative: 9.8 % (ref 3.0–12.0)
Neutro Abs: 3.3 10*3/uL (ref 1.4–7.7)
Neutrophils Relative %: 55.1 % (ref 43.0–77.0)
Platelets: 237 10*3/uL (ref 150.0–400.0)
RBC: 3.94 Mil/uL (ref 3.87–5.11)
RDW: 14.8 % (ref 11.5–15.5)
WBC: 6.1 10*3/uL (ref 4.0–10.5)

## 2014-10-11 LAB — IBC PANEL
Iron: 59 ug/dL (ref 42–145)
Saturation Ratios: 12.3 % — ABNORMAL LOW (ref 20.0–50.0)
Transferrin: 342 mg/dL (ref 212.0–360.0)

## 2014-10-11 MED ORDER — POTASSIUM CHLORIDE CRYS ER 20 MEQ PO TBCR: 20.0000 meq | EXTENDED_RELEASE_TABLET | Freq: Every day | ORAL | Status: DC

## 2014-10-11 MED ORDER — OMEGA-3-ACID ETHYL ESTERS 1 G PO CAPS: 2.0000 g | ORAL_CAPSULE | Freq: Two times a day (BID) | ORAL | Status: DC

## 2014-10-11 MED ORDER — ISOSORBIDE DINITRATE 30 MG PO TABS: 30.0000 mg | ORAL_TABLET | Freq: Every day | ORAL | Status: DC

## 2014-10-11 NOTE — Progress Notes (Signed)
Pre visit review using our clinic review tool, if applicable. No additional management support is needed unless otherwise documented below in the visit note. 

## 2014-10-11 NOTE — Progress Notes (Signed)
   Subjective:    Patient ID: Amy Morton, female    DOB: [birth year], 79 y.o.   MRN: [id number]  HPI  Amy Morton is a 80 yo female here for a 6 month follow up.   1) She was referred to Ortho due to her knee  Dr. Roland Rack- Injections x 1. Helped by time she got home and no complaints today.   2) Did not go to Dr. Durwin Reges- has hemorrhoids and feels the positive fecal occult test was due to this. Will repeat labs (HFP will get done by Dr. Julious Payer office next week).   Review of Systems  Constitutional: Negative for fever, chills, diaphoresis and fatigue.  Respiratory: Negative for chest tightness, shortness of breath and wheezing.   Cardiovascular: Negative for chest pain, palpitations and leg swelling.  Gastrointestinal: Positive for anal bleeding. Negative for nausea, vomiting and diarrhea.       Reports this is from hemorrhoids  Musculoskeletal: Negative for arthralgias.  Skin: Negative for rash.  Neurological: Negative for dizziness, weakness, numbness and headaches.  Psychiatric/Behavioral: The patient is not nervous/anxious.       Objective:   Physical Exam  Constitutional: She is oriented to person, place, and time. She appears well-developed and well-nourished. No distress.  BP 138/80 mmHg  Pulse 68  Temp(Src) 98.1 F (36.7 C) (Oral)  Ht 4' 11.5" (1.511 m)  Wt 138 lb 4 oz (62.71 kg)  BMI 27.47 kg/m2  SpO2 97%   HENT:  Head: Normocephalic and atraumatic.  Right Ear: External ear normal.  Left Ear: External ear normal.  Cardiovascular: Normal rate, regular rhythm, normal heart sounds and intact distal pulses.  Exam reveals no gallop and no friction rub.   No murmur heard. Pulmonary/Chest: Effort normal and breath sounds normal. No respiratory distress. She has no wheezes. She has no rales. She exhibits no tenderness.  Neurological: She is alert and oriented to person, place, and time. No cranial nerve deficit. She exhibits normal muscle tone. Coordination normal.    Skin: Skin is warm and dry. No rash noted. She is not diaphoretic.  Psychiatric: She has a normal mood and affect. Her behavior is normal. Judgment and thought content normal.        Assessment & Plan:

## 2014-10-11 NOTE — Patient Instructions (Signed)
Visit the lab before leaving today.   Follow up in 3 months.

## 2014-10-12 ENCOUNTER — Other Ambulatory Visit: Payer: Self-pay | Admitting: Nurse Practitioner

## 2014-10-12 DIAGNOSIS — R195 Other fecal abnormalities: Secondary | ICD-10-CM

## 2014-10-12 DIAGNOSIS — D649 Anemia, unspecified: Secondary | ICD-10-CM

## 2014-10-15 NOTE — Assessment & Plan Note (Signed)
Improved with injections from Dr. Roland Rack.

## 2014-10-15 NOTE — Assessment & Plan Note (Signed)
Repeat IBC panel, CMET, and CBC w/ diff from 6 months ago seeing Dr. Derrel Nip.

## 2014-10-19 ENCOUNTER — Ambulatory Visit (INDEPENDENT_AMBULATORY_CARE_PROVIDER_SITE_OTHER): Payer: Medicare Other | Admitting: Cardiovascular Disease

## 2014-10-19 ENCOUNTER — Encounter: Payer: Self-pay | Admitting: Cardiovascular Disease

## 2014-10-19 VITALS — BP 100/62 | HR 64 | Ht 59.0 in | Wt 138.0 lb

## 2014-10-19 DIAGNOSIS — Z9861 Coronary angioplasty status: Secondary | ICD-10-CM

## 2014-10-19 DIAGNOSIS — E785 Hyperlipidemia, unspecified: Secondary | ICD-10-CM | POA: Diagnosis not present

## 2014-10-19 DIAGNOSIS — I350 Nonrheumatic aortic (valve) stenosis: Secondary | ICD-10-CM

## 2014-10-19 DIAGNOSIS — I2510 Atherosclerotic heart disease of native coronary artery without angina pectoris: Secondary | ICD-10-CM

## 2014-10-19 NOTE — Progress Notes (Signed)
Cardiology Office Note   Date:  10/19/2014   ID:  Amy Morton, DOB 06/18/1935, MRN [id number]  PCP:  Rubbie Battiest, NP  Cardiologist:   Thayer Headings, MD   Chief Complaint  Patient presents with  . Coronary Artery Disease    6 month f/u c/o edema feet/hands. Meds reveiwed verbally with pt.   1. CAD - s/p PTCA - 1996 2. Hyperlipidemia 3. Anxiety/depression 4.  History of Present Illness:  Amy Morton is a 80 yo with hx of CAD - s/p PCI in 1996 [name])  She has been seeing Dr. Rosalita Levan. Wanted to changed cardiologist.  She was recently prescribed Abilify and had a bad reaction. She stated that it made her feel quite bad ( chest burning) She has stopped the medication and now feels quite a bit better. She was seen in the ER and had marked HTN. BP is now better.   She walks daily.  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 female who presents for CAD and hyperlipidemia   Past Medical History  Diagnosis Date  . Anxiety   . Arthritis   . Depression   . GERD (gastroesophageal reflux disease)   . Hyperlipidemia   . Hypertension   . Gastric ulcer   . CAD (coronary artery disease)     s/p angioplasty 1996  . History of colonic polyps     Dr. Vira Agar  . Vitamin D deficiency   . Abnormal mammogram 2007    Recommend close follow up  . Shingles 2008    T-9 distribution  . Elevated LFTs 2010    s/p ultrasound w/ possible fatty liver; GI consult. Resolution 2011  . Aortic stenosis     mild to moderate  . Anemia     Iron, b12, SPEP, UPEP normal, 12/2012  . Valvular heart disease     Mild to Moderate MR, AS  . Asthma   . aortic stenosis   . Peritoneal carcinomatosis 2001    Dr. Oliva Bustard & Dr. Claiborne Rigg s/p chemo    Past Surgical History  Procedure Laterality Date  . Tonsillectomy  1950  . Cholecystectomy  1987  . Colectomy  2001  . Laparoscopic bilateral salpingo  oopherectomy  1988    Dr. Laverta Baltimore  . Appendectomy    . Abdominal hysterectomy  2001  . Coronary angioplasty  1996  . Cardiac catheterization  2007    50% mid LAD stenosis, 70% proximal RCA with 100% distal RCA stenosis with collaterals, EF 65%     Current Outpatient Prescriptions  Medication Sig Dispense Refill  . acetaminophen (TYLENOL) 650 MG CR tablet Take 650 mg by mouth as needed for pain.     Marland Kitchen albuterol (PROVENTIL HFA;VENTOLIN HFA) 108 (90 BASE) MCG/ACT inhaler Inhale into the lungs as needed for wheezing or shortness of breath.    Marland Kitchen aspirin 81 MG tablet Take 81 mg by mouth daily.    . busPIRone (BUSPAR) 5 MG tablet Take 1 tablet (5 mg total) by mouth 2 (two) times daily. 180 tablet 3  . calcium gluconate 650 MG tablet Take 650 mg by mouth 2 (two) times daily.    . Cholecalciferol ([employer] VIT D 5000 HIGH-POTENCY) 5000 UNITS capsule One capsule every other day (Patient taking differently: Take 5,000 Units by mouth every other day. One capsule every other day)    . citalopram (CELEXA)  20 MG tablet Take 1 tablet daily 90 tablet 3  . Fluticasone-Salmeterol (ADVAIR) 250-50 MCG/DOSE AEPB Inhale 1 puff into the lungs as needed.    . isosorbide dinitrate (ISORDIL) 30 MG tablet Take 1 tablet (30 mg total) by mouth daily. (Patient taking differently: Take 30 mg by mouth 2 (two) times daily. ) 90 tablet 3  . lovastatin (MEVACOR) 20 MG tablet Take 1 tablet (20 mg total) by mouth at bedtime. 90 tablet 3  . metoprolol (LOPRESSOR) 50 MG tablet Take 1 tablet (50 mg total) by mouth 2 (two) times daily. 180 tablet 3  . mirtazapine (REMERON) 7.5 MG tablet Take 1 tablet (7.5 mg total) by mouth at bedtime. 90 tablet 3  . Multiple Vitamin (MULTIVITAMIN) capsule Take 1 capsule by mouth daily.    Marland Kitchen omega-3 acid ethyl esters (LOVAZA) 1 G capsule Take 2 capsules (2 g total) by mouth 2 (two) times daily. 120 capsule 3  . omeprazole (PRILOSEC) 20 MG capsule Take 1 capsule (20 mg total) by mouth daily. 30 capsule 11    . potassium chloride SA (K-DUR,KLOR-CON) 20 MEQ tablet Take 1 tablet (20 mEq total) by mouth daily. 90 tablet 1  . Probiotic Product (FLORAJEN BIFIDOBLEND) CAPS Take 1 capsule by mouth daily. 90 capsule 3  . vitamin B-12 (CYANOCOBALAMIN) 250 MCG tablet Take 250 mcg by mouth daily.     No current facility-administered medications for this visit.    Allergies:   Abilify    Social History:  The patient  reports that she has never smoked. She does not have any smokeless tobacco history on file. She reports that she does not drink alcohol or use illicit drugs.   Family History:  The patient's family history includes Cancer in her father; Cancer (age of onset: 31) in her daughter; Diabetes in her brother and sister; Heart disease in her brother; Hypertension in her mother; Stroke (age of onset: 81) in her mother.    ROS:  Please see the history of present illness.    Review of Systems: Constitutional:  denies fever, chills, diaphoresis, appetite change and fatigue.  HEENT: denies photophobia, eye pain, redness, hearing loss, ear pain, congestion, sore throat, rhinorrhea, sneezing, neck pain, neck stiffness and tinnitus.  Respiratory: denies SOB, DOE, cough, chest tightness, and wheezing.  Cardiovascular: denies chest pain, palpitations and leg swelling.  Gastrointestinal: denies nausea, vomiting, abdominal pain, diarrhea, constipation, blood in stool.  Genitourinary: denies dysuria, urgency, frequency, hematuria, flank pain and difficulty urinating.  Musculoskeletal: denies  myalgias, back pain, joint swelling, arthralgias and gait problem.   Skin: denies pallor, rash and wound.  Neurological: denies dizziness, seizures, syncope, weakness, light-headedness, numbness and headaches.   Hematological: denies adenopathy, easy bruising, personal or family bleeding history.  Psychiatric/ Behavioral: denies suicidal ideation, mood changes, confusion, nervousness, sleep disturbance and agitation.        All other systems are reviewed and negative.    PHYSICAL EXAM: VS:  BP 100/62 mmHg  Pulse 64  Ht 4\' 11"  (1.499 m)  Wt 138 lb (62.596 kg)  BMI 27.86 kg/m2 , BMI Body mass index is 27.86 kg/(m^2). GEN: Well nourished, well developed, in no acute distress HEENT: normal Neck: no JVD, carotid bruits, or masses Cardiac: RRR; no murmurs, rubs, or gallops,no edema  Respiratory:  clear to auscultation bilaterally, normal work of breathing GI: soft, nontender, nondistended, + BS MS: no deformity or atrophy Skin: warm and dry, no rash Neuro:  Strength and sensation are intact Psych: normal  EKG:  EKG is ordered today. The ekg ordered today demonstrates normal sinus rhythm at 65. Poor R wave progression which is likely due to lead placement versus pulmonary disease.   Recent Labs: 05/10/2014: Magnesium 1.5; TSH 1.71 10/11/2014: ALT 25; BUN 14; Creatinine 0.71; Hemoglobin 11.6*; Platelets 237.0; Potassium 4.9; Sodium 136    Lipid Panel    Component Value Date/Time   CHOL 144 05/10/2014 0851   TRIG 79.0 05/10/2014 0851   HDL 40.20 05/10/2014 0851   CHOLHDL 4 05/10/2014 0851   VLDL 15.8 05/10/2014 0851   LDLCALC 88 05/10/2014 0851      Wt Readings from Last 3 Encounters:  10/19/14 138 lb (62.596 kg)  10/11/14 138 lb 4 oz (62.71 kg)  07/07/14 134 lb 12.8 oz (61.145 kg)      Other studies Reviewed: Additional studies/ records that were reviewed today include: . Review of the above records demonstrates:    ASSESSMENT AND PLAN:  1. CAD - s/p PTCA - 1996 -  she's doing very well pitches not having any episodes of chest pain or shortness of breath. Continue same medications.  2. Hyperlipidemia - will check lipids today.  Her other labs were drawn by her medical doctor last week.   3. Anxiety/depression followed by her general medical doctor.   Current medicines are reviewed at length with the patient today.  The patient does not have concerns regarding  medicines.  The following changes have been made:  no change  Labs/ tests ordered today include:  Orders Placed This Encounter  Procedures  . EKG 12-Lead     Disposition:   FU with me in 6 months .    Signed, Nahser, Wonda Cheng, MD  10/19/2014 12:02 PM    [address], [place], [place]  [address] Phone: [phone]; Fax: [phone]

## 2014-10-19 NOTE — Patient Instructions (Signed)
Your physician recommends that you have labs today:  Lipid panel   Your physician wants you to follow-up in: 6 months. You will receive a reminder letter in the mail two months in advance. If you don't receive a letter, please call our office to schedule the follow-up appointment.

## 2014-10-20 LAB — LIPID PANEL
Chol/HDL Ratio: 3.1 ratio units (ref 0.0–4.4)
Cholesterol, Total: 148 mg/dL (ref 100–199)
HDL: 48 mg/dL (ref >39)
LDL Calculated: 61 mg/dL (ref 0–99)
Triglycerides: 193 mg/dL — ABNORMAL HIGH (ref 0–149)
VLDL Cholesterol Cal: 39 mg/dL (ref 5–40)

## 2014-10-24 ENCOUNTER — Ambulatory Visit: Payer: Self-pay | Admitting: Nurse Practitioner

## 2014-10-24 DIAGNOSIS — Z1231 Encounter for screening mammogram for malignant neoplasm of breast: Secondary | ICD-10-CM | POA: Diagnosis not present

## 2014-10-30 ENCOUNTER — Telehealth: Payer: Self-pay

## 2014-10-30 NOTE — Telephone Encounter (Signed)
Pt called and questioned recent Rx ISOSORBIDE Dinitrate 30mg , take 1 daily PO.  Pt said she was accustomed to taking ISOSORBIDE [place] [facility] 30mg  BID PO and was worried this new medication would hurt her.  After speaking with Dr. Derrel Nip, I told the patient it was okay to take and to take as prescribed.  Patient also inquired about referral with Dr. Roselyn Reef and I confirmed with Bonnita Nasuti in was in the process and she would be contacted by their office.  Patient verbalized understanding on all and was encouraged to call back with further questions or concerns.

## 2014-10-30 NOTE — Telephone Encounter (Signed)
Noted  

## 2014-11-17 ENCOUNTER — Emergency Department
Admit: 2014-11-17 | Disposition: A | Discharge status: Home / Self Care | Payer: Self-pay | Admitting: Emergency Medicine

## 2014-11-17 ENCOUNTER — Encounter: Payer: Self-pay | Admitting: Internal Medicine

## 2014-11-17 ENCOUNTER — Ambulatory Visit (INDEPENDENT_AMBULATORY_CARE_PROVIDER_SITE_OTHER): Payer: Medicare Other | Admitting: Internal Medicine

## 2014-11-17 VITALS — BP 126/80 | HR 84 | Temp 97.9°F | Wt 138.0 lb

## 2014-11-17 DIAGNOSIS — I2510 Atherosclerotic heart disease of native coronary artery without angina pectoris: Secondary | ICD-10-CM | POA: Diagnosis not present

## 2014-11-17 DIAGNOSIS — Z9861 Coronary angioplasty status: Secondary | ICD-10-CM | POA: Diagnosis not present

## 2014-11-17 DIAGNOSIS — R1013 Epigastric pain: Secondary | ICD-10-CM

## 2014-11-17 DIAGNOSIS — I10 Essential (primary) hypertension: Secondary | ICD-10-CM | POA: Diagnosis not present

## 2014-11-17 DIAGNOSIS — Z8719 Personal history of other diseases of the digestive system: Secondary | ICD-10-CM

## 2014-11-17 DIAGNOSIS — N390 Urinary tract infection, site not specified: Secondary | ICD-10-CM | POA: Diagnosis not present

## 2014-11-17 DIAGNOSIS — R195 Other fecal abnormalities: Secondary | ICD-10-CM | POA: Diagnosis not present

## 2014-11-17 DIAGNOSIS — Z90710 Acquired absence of both cervix and uterus: Secondary | ICD-10-CM | POA: Diagnosis not present

## 2014-11-17 DIAGNOSIS — R112 Nausea with vomiting, unspecified: Secondary | ICD-10-CM | POA: Diagnosis not present

## 2014-11-17 DIAGNOSIS — K5730 Diverticulosis of large intestine without perforation or abscess without bleeding: Secondary | ICD-10-CM | POA: Diagnosis not present

## 2014-11-17 DIAGNOSIS — Z7982 Long term (current) use of aspirin: Secondary | ICD-10-CM | POA: Diagnosis not present

## 2014-11-17 DIAGNOSIS — K2980 Duodenitis without bleeding: Secondary | ICD-10-CM | POA: Diagnosis not present

## 2014-11-17 DIAGNOSIS — R011 Cardiac murmur, unspecified: Secondary | ICD-10-CM | POA: Diagnosis not present

## 2014-11-17 DIAGNOSIS — Z8711 Personal history of peptic ulcer disease: Secondary | ICD-10-CM | POA: Diagnosis not present

## 2014-11-17 DIAGNOSIS — R109 Unspecified abdominal pain: Secondary | ICD-10-CM | POA: Diagnosis not present

## 2014-11-17 DIAGNOSIS — Z9049 Acquired absence of other specified parts of digestive tract: Secondary | ICD-10-CM | POA: Diagnosis not present

## 2014-11-17 LAB — COMPREHENSIVE METABOLIC PANEL
ALT: 25 U/L (ref 0–35)
AST: 36 U/L (ref 0–37)
Albumin: 4.3 g/dL (ref 3.5–5.2)
Albumin: 3.9 g/dL
Alkaline Phosphatase: 44 U/L (ref 39–117)
Alkaline Phosphatase: 44 U/L
Anion Gap: 11 (ref 7–16)
BUN: 17 mg/dL (ref 6–23)
BUN: 15 mg/dL
Bilirubin,Total: 0.3 mg/dL
CO2: 26 mEq/L (ref 19–32)
Calcium, Total: 9.2 mg/dL
Calcium: 10 mg/dL (ref 8.4–10.5)
Chloride: 101 mEq/L (ref 96–112)
Chloride: 101 mmol/L
Co2: 23 mmol/L
Creatinine, Ser: 0.77 mg/dL (ref 0.40–1.20)
Creatinine: 0.71 mg/dL
EGFR (African American): >60
EGFR (Non-African Amer.): >60
GFR: 77.14 mL/min (ref >60.00)
Glucose, Bld: 111 mg/dL — ABNORMAL HIGH (ref 70–99)
Glucose: 119 mg/dL — ABNORMAL HIGH
Potassium: 4.1 mEq/L (ref 3.5–5.1)
Potassium: 3.5 mmol/L
SGOT(AST): 40 U/L
SGPT (ALT): 27 U/L
Sodium: 135 mEq/L (ref 135–145)
Sodium: 135 mmol/L
Total Bilirubin: 0.4 mg/dL (ref 0.2–1.2)
Total Protein: 7.9 g/dL (ref 6.0–8.3)
Total Protein: 7.7 g/dL

## 2014-11-17 LAB — URINALYSIS, COMPLETE
Bilirubin,UR: NEGATIVE
Glucose,UR: NEGATIVE mg/dL (ref 0–75)
Ketone: NEGATIVE
Nitrite: NEGATIVE
Ph: 5 (ref 4.5–8.0)
Protein: 30
Specific Gravity: 1.014 (ref 1.003–1.030)

## 2014-11-17 LAB — CBC
HCT: 35.4 % — ABNORMAL LOW (ref 36.0–46.0)
Hemoglobin: 11.6 g/dL — ABNORMAL LOW (ref 12.0–15.0)
MCHC: 32.9 g/dL (ref 30.0–36.0)
MCV: 89.3 fl (ref 78.0–100.0)
Platelets: 201 10*3/uL (ref 150.0–400.0)
RBC: 3.96 Mil/uL (ref 3.87–5.11)
RDW: 15.4 % (ref 11.5–15.5)
WBC: 8.1 10*3/uL (ref 4.0–10.5)

## 2014-11-17 LAB — CBC WITH DIFFERENTIAL/PLATELET
Basophil #: 0 10*3/uL (ref 0.0–0.1)
Basophil %: 0.4 %
Eosinophil #: 0.1 10*3/uL (ref 0.0–0.7)
Eosinophil %: 1.7 %
HCT: 35.9 % (ref 35.0–47.0)
HGB: 11.9 g/dL — ABNORMAL LOW (ref 12.0–16.0)
Lymphocyte #: 1.5 10*3/uL (ref 1.0–3.6)
Lymphocyte %: 18.2 %
MCH: 30.1 pg (ref 26.0–34.0)
MCHC: 33.2 g/dL (ref 32.0–36.0)
MCV: 91 fL (ref 80–100)
Monocyte #: 0.5 x10 3/mm (ref 0.2–0.9)
Monocyte %: 5.5 %
Neutrophil #: 6.3 10*3/uL (ref 1.4–6.5)
Neutrophil %: 74.2 %
Platelet: 180 10*3/uL (ref 150–440)
RBC: 3.96 10*6/uL (ref 3.80–5.20)
RDW: 15.4 % — ABNORMAL HIGH (ref 11.5–14.5)
WBC: 8.4 10*3/uL (ref 3.6–11.0)

## 2014-11-17 LAB — LIPASE, BLOOD: Lipase: 25 U/L

## 2014-11-17 LAB — TROPONIN I: Troponin-I: <0.03 ng/mL

## 2014-11-17 LAB — H. PYLORI ANTIBODY, IGG: H Pylori IgG: NEGATIVE

## 2014-11-17 MED ORDER — SUCRALFATE 1 GM/10ML PO SUSP: 1.0000 g | Freq: Three times a day (TID) | ORAL | Status: DC

## 2014-11-17 MED ORDER — OMEPRAZOLE 40 MG PO CPDR: 40.0000 mg | DELAYED_RELEASE_CAPSULE | Freq: Every day | ORAL | Status: DC

## 2014-11-17 NOTE — Patient Instructions (Signed)

## 2014-11-17 NOTE — Progress Notes (Signed)
Pre visit review using our clinic review tool, if applicable. No additional management support is needed unless otherwise documented below in the visit note. 

## 2014-11-17 NOTE — Progress Notes (Signed)
Subjective:    Patient ID: Amy Morton, female    DOB: [birth year], 80 y.o.   MRN: [id number]  HPI  Pt presents to the clinic today with c/o upper abdominal pain, diarrhea, nausea and vomiting. She reports this started yesterday. She has vomited only 2 times. She reports she vomited green liquid, no blood. She has been nauseated all day, unable to eat but has been keeping water down. She reports her belly hurts and she has been having really dark stools but denies seeing bright red blood. She has not tried anything OTC. She takes Prilosec daily. She has been avoiding NSAIDS due to history of stomach ulcers. She only takes a baby ASA daily. She has a remote history of peritoneal cancer, s/p chemo. She has been discharged from her oncologist care. She reports she was referred to GI 09/2014 for blood in her stool at that time but reports an appt has not been made for her yet.  Review of Systems      Past Medical History  Diagnosis Date  . Anxiety   . Arthritis   . Depression   . GERD (gastroesophageal reflux disease)   . Hyperlipidemia   . Hypertension   . Gastric ulcer   . CAD (coronary artery disease)     s/p angioplasty 1996  . History of colonic polyps     Dr. Vira Agar  . Vitamin D deficiency   . Abnormal mammogram 2007    Recommend close follow up  . Shingles 2008    T-9 distribution  . Elevated LFTs 2010    s/p ultrasound w/ possible fatty liver; GI consult. Resolution 2011  . Aortic stenosis     mild to moderate  . Anemia     Iron, b12, SPEP, UPEP normal, 12/2012  . Valvular heart disease     Mild to Moderate MR, AS  . Asthma   . aortic stenosis   . Peritoneal carcinomatosis 2001    Dr. Oliva Bustard & Dr. Claiborne Rigg s/p chemo    Current Outpatient Prescriptions  Medication Sig Dispense Refill  . acetaminophen (TYLENOL) 650 MG CR tablet Take 650 mg by mouth as needed for pain.     Marland Kitchen albuterol (PROVENTIL HFA;VENTOLIN HFA) 108 (90 BASE) MCG/ACT inhaler Inhale into  the lungs as needed for wheezing or shortness of breath.    Marland Kitchen aspirin 81 MG tablet Take 81 mg by mouth daily.    . busPIRone (BUSPAR) 5 MG tablet Take 1 tablet (5 mg total) by mouth 2 (two) times daily. 180 tablet 3  . calcium gluconate 650 MG tablet Take 650 mg by mouth 2 (two) times daily.    . Cholecalciferol ([employer]S VIT D 5000 HIGH-POTENCY) 5000 UNITS capsule One capsule every other day (Patient taking differently: Take 5,000 Units by mouth every other day. One capsule every other day)    . citalopram (CELEXA) 20 MG tablet Take 1 tablet daily 90 tablet 3  . Fluticasone-Salmeterol (ADVAIR) 250-50 MCG/DOSE AEPB Inhale 1 puff into the lungs as needed.    . isosorbide dinitrate (ISORDIL) 30 MG tablet Take 1 tablet (30 mg total) by mouth daily. (Patient taking differently: Take 30 mg by mouth 2 (two) times daily. ) 90 tablet 3  . lovastatin (MEVACOR) 20 MG tablet Take 1 tablet (20 mg total) by mouth at bedtime. 90 tablet 3  . metoprolol (LOPRESSOR) 50 MG tablet Take 1 tablet (50 mg total) by mouth 2 (two) times daily. 180 tablet 3  . mirtazapine (  REMERON) 7.5 MG tablet Take 1 tablet (7.5 mg total) by mouth at bedtime. 90 tablet 3  . Multiple Vitamin (MULTIVITAMIN) capsule Take 1 capsule by mouth daily.    Marland Kitchen omega-3 acid ethyl esters (LOVAZA) 1 G capsule Take 2 capsules (2 g total) by mouth 2 (two) times daily. 120 capsule 3  . omeprazole (PRILOSEC) 20 MG capsule Take 1 capsule (20 mg total) by mouth daily. 30 capsule 11  . potassium chloride SA (K-DUR,KLOR-CON) 20 MEQ tablet Take 1 tablet (20 mEq total) by mouth daily. 90 tablet 1  . Probiotic Product (FLORAJEN BIFIDOBLEND) CAPS Take 1 capsule by mouth daily. 90 capsule 3  . vitamin B-12 (CYANOCOBALAMIN) 250 MCG tablet Take 250 mcg by mouth daily.     No current facility-administered medications for this visit.    Allergies  Allergen Reactions  . Abilify [Aripiprazole] Other (See Comments)    Burning in Chest Elevated b/p Insomnia    Family  History  Problem Relation Age of Onset  . Hypertension Mother   . Stroke Mother 31    Cerebral Hemorrhage  . Cancer Father     bone cancer  . Diabetes Sister   . Diabetes Brother   . Heart disease Brother   . Cancer Daughter 15    Ovarian cancer    History   Social History  . Marital Status: Widowed    Spouse Name: N/A  . Number of Children: 3  . Years of Education: N/A   Occupational History  . [employer]     Retired  . 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 Smoking status: Never Smoker   . Smokeless tobacco: Not on file  . Alcohol Use: No  . Drug Use: No  . Sexual Activity: Not on file   Other Topics Concern  . Not on file   Social History Narrative   Pt lives in [place] by herself. She is widowed and has a daughter [name]), son [name]). She also had a son that was killed in a work related accident [name] - died age).       Caffeine - none   Exercise - walking     Constitutional: Denies fever, malaise, fatigue, headache or abrupt weight changes.  Respiratory: Denies difficulty breathing, shortness of breath, cough or sputum production.   Cardiovascular: Denies chest pain, chest tightness, palpitations or swelling in the hands or feet.  Gastrointestinal: Pt reports abdominal pain, nausea, vomiting and diarrhea. Denies bloating, constipation.     No other specific complaints in a complete review of systems (except as listed in HPI above).  Objective:   Physical Exam   BP 126/80 mmHg  Pulse 84  Temp(Src) 97.9 F (36.6 C) (Oral)  Wt 138 lb (62.596 kg)  SpO2 98% Wt Readings from Last 3 Encounters:  11/17/14 138 lb (62.596 kg)  10/19/14 138 lb (62.596 kg)  10/11/14 138 lb 4 oz (62.71 kg)    General: Appears her stated age, well developed, well nourished in NAD. Skin: Warm, dry and intact. No rashes, lesions or ulcerations noted. Cardiovascular: Normal rate and rhythm. S1,S2 noted.  No murmur, rubs or gallops  noted.  Pulmonary/Chest: Normal effort and positive vesicular breath sounds. No respiratory distress. No wheezes, rales or ronchi noted.  Abdomen: Soft and generally tender. Normal bowel sounds, no bruits noted. No distention or masses noted. Liver, spleen and kidneys non palpable. DRE: External hemorrhoids noted. Stool is heme positive.  BMET    Component Value Date/Time   NA 136 10/11/2014 1324   K 4.9 10/11/2014 1324   CL 101 10/11/2014 1324   CO2 28 10/11/2014 1324   GLUCOSE 106* 10/11/2014 1324   BUN 14 10/11/2014 1324   BUN 13 11/16/2013   CREATININE 0.71 10/11/2014 1324   CREATININE 0.7 11/16/2013   CALCIUM 9.7 10/11/2014 1324    Lipid Panel     Component Value Date/Time   CHOL 148 10/19/2014 1212   CHOL 144 05/10/2014 0851   TRIG 193* 10/19/2014 1212   HDL 48 10/19/2014 1212   HDL 40.20 05/10/2014 0851   CHOLHDL 3.1 10/19/2014 1212   CHOLHDL 4 05/10/2014 0851   VLDL 15.8 05/10/2014 0851   LDLCALC 61 10/19/2014 1212   LDLCALC 88 05/10/2014 0851    CBC    Component Value Date/Time   WBC 6.1 10/11/2014 1324   WBC 4.5 11/16/2013   RBC 3.94 10/11/2014 1324   HGB 11.6* 10/11/2014 1324   HCT 34.8* 10/11/2014 1324   PLT 237.0 10/11/2014 1324   MCV 88.2 10/11/2014 1324   MCHC 33.3 10/11/2014 1324   RDW 14.8 10/11/2014 1324   LYMPHSABS 1.8 10/11/2014 1324   MONOABS 0.6 10/11/2014 1324   EOSABS 0.3 10/11/2014 1324   BASOSABS 0.0 10/11/2014 1324    Hgb A1C Lab Results  Component Value Date   HGBA1C 6.2 05/15/2014        Assessment & Plan:   Abdominal pian, nausea, vomiting, diarrhea, blood in stool:  Will increase her Protonix to 40 mg daily Carafate 1 gm QID Will check CBC, CMET and H Pylori today Referral placed urgently to GI, we scheduled her an appt for Monday  She will go to ER over the weekend if worse

## 2014-11-18 DIAGNOSIS — K5730 Diverticulosis of large intestine without perforation or abscess without bleeding: Secondary | ICD-10-CM | POA: Diagnosis not present

## 2014-11-20 ENCOUNTER — Other Ambulatory Visit (INDEPENDENT_AMBULATORY_CARE_PROVIDER_SITE_OTHER): Payer: Medicare Other

## 2014-11-20 ENCOUNTER — Ambulatory Visit (INDEPENDENT_AMBULATORY_CARE_PROVIDER_SITE_OTHER): Payer: Medicare Other | Admitting: Nurse Practitioner

## 2014-11-20 ENCOUNTER — Encounter: Payer: Self-pay | Admitting: Nurse Practitioner

## 2014-11-20 VITALS — BP 116/60 | HR 68 | Ht 59.5 in | Wt 138.0 lb

## 2014-11-20 DIAGNOSIS — R1013 Epigastric pain: Secondary | ICD-10-CM | POA: Diagnosis not present

## 2014-11-20 DIAGNOSIS — R933 Abnormal findings on diagnostic imaging of other parts of digestive tract: Secondary | ICD-10-CM | POA: Diagnosis not present

## 2014-11-20 DIAGNOSIS — R112 Nausea with vomiting, unspecified: Secondary | ICD-10-CM

## 2014-11-20 LAB — CBC
HCT: 32 % — ABNORMAL LOW (ref 36.0–46.0)
Hemoglobin: 10.8 g/dL — ABNORMAL LOW (ref 12.0–15.0)
MCHC: 33.7 g/dL (ref 30.0–36.0)
MCV: 88.2 fl (ref 78.0–100.0)
Platelets: 213 10*3/uL (ref 150.0–400.0)
RBC: 3.63 Mil/uL — ABNORMAL LOW (ref 3.87–5.11)
RDW: 15.6 % — ABNORMAL HIGH (ref 11.5–15.5)
WBC: 6.3 10*3/uL (ref 4.0–10.5)

## 2014-11-20 NOTE — Progress Notes (Signed)
HPI :   Patient is a 80 year old female referred by PCP. She has  remote history of peritoneal cancer and apparently had 1/3 of her colon removed in 2001. She had a colonoscopy for evaluation of iron deficiency anemia in 2013 by Dr. Vira Agar in [place]. Findings included left sided diverticulosis,  hemorrhoids and a small solitary ulcer at the anastomosis. Upper endoscopy done at the same time. I do not have the actual procedure report but duodenal biopsy showed mild, chronic duodenitis.  Patient had a positive IFOBT late October , a GI consult was recommended but for unclear reasons didn't happen. Patient recently developed acute abdominal pain, nausea, vomiting, and black diarrhea.  She was seen by PCP 11/17/14. Stool heme positive. Carafate was prescribed, her Protonix was doubled. CBC unremarkable as was CMET. Patient referred to [place] but ended up in the emergency department at [facility] 3 days later on 11/20/14.  White count was still normal. Hemoglobin stable at 11.6 . CT scan with contrast revealed edema of the duodenal bulb and second and third portions of the duodenum with slight adjacent soft-tissue stranding. Pancreas appeared normal. Patient prescribed Cipro for presumed infectious process of the small bowel. Her symptoms have significantly improved on antibiotics.   Past Medical History  Diagnosis Date  . Anxiety   . Arthritis   . Depression   . GERD (gastroesophageal reflux disease)   . Hyperlipidemia   . Hypertension   . Gastric ulcer   . CAD (coronary artery disease)     s/p angioplasty 1996  . History of colonic polyps     Dr. Vira Agar  . Vitamin D deficiency   . Abnormal mammogram 2007    Recommend close follow up  . Shingles 2008    T-9 distribution  . Elevated LFTs 2010    s/p ultrasound w/ possible fatty liver; GI consult. Resolution 2011  . Aortic stenosis     mild to moderate  . Anemia     Iron, b12, SPEP, UPEP normal, 12/2012  . Valvular heart disease     Mild to  Moderate MR, AS  . Asthma   . aortic stenosis   . Peritoneal carcinomatosis 2001    Dr. Oliva Bustard & Dr. Claiborne Rigg s/p chemo    Family History  Problem Relation Age of Onset  . Hypertension Mother   . Stroke Mother 40    Cerebral Hemorrhage  . Cancer Father     bone cancer  . Diabetes Sister   . Diabetes Brother   . Heart disease Brother   . Cancer Daughter 56    Ovarian cancer  . Colon cancer Neg Hx   . Colon polyps Neg Hx    History  Substance Use Topics  . Smoking status: Never Smoker   . Smokeless tobacco: Never Used  . Alcohol Use: No   Current Outpatient Prescriptions  Medication Sig Dispense Refill  . acetaminophen (TYLENOL) 650 MG CR tablet Take 650 mg by mouth as needed for pain.     Marland Kitchen albuterol (PROVENTIL HFA;VENTOLIN HFA) 108 (90 BASE) MCG/ACT inhaler Inhale into the lungs as needed for wheezing or shortness of breath.    Marland Kitchen aspirin 81 MG tablet Take 81 mg by mouth daily.    . busPIRone (BUSPAR) 5 MG tablet Take 1 tablet (5 mg total) by mouth 2 (two) times daily. 180 tablet 3  . calcium gluconate 650 MG tablet Take 650 mg by mouth 2 (two) times daily.    . Cholecalciferol ([employer]  VIT D 5000 HIGH-POTENCY) 5000 UNITS capsule One capsule every other day (Patient taking differently: Take 5,000 Units by mouth every other day. One capsule every other day)    . ciprofloxacin (CIPRO) 500 MG tablet Take 500 mg by mouth 2 (two) times daily.   0  . citalopram (CELEXA) 20 MG tablet Take 1 tablet daily 90 tablet 3  . dicyclomine (BENTYL) 20 MG tablet Take 20 mg by mouth 4 (four) times daily.   0  . Fluticasone-Salmeterol (ADVAIR) 250-50 MCG/DOSE AEPB Inhale 1 puff into the lungs as needed.    . isosorbide dinitrate (ISORDIL) 30 MG tablet Take 1 tablet (30 mg total) by mouth daily. (Patient taking differently: Take 30 mg by mouth 2 (two) times daily. ) 90 tablet 3  . lovastatin (MEVACOR) 20 MG tablet Take 1 tablet (20 mg total) by mouth at bedtime. 90 tablet 3  . metoCLOPramide  (REGLAN) 10 MG tablet Take 10 mg by mouth every 6 (six) hours as needed. Pt takes 1/2 tablet  0  . metoprolol (LOPRESSOR) 50 MG tablet Take 1 tablet (50 mg total) by mouth 2 (two) times daily. 180 tablet 3  . mirtazapine (REMERON) 7.5 MG tablet Take 1 tablet (7.5 mg total) by mouth at bedtime. 90 tablet 3  . Multiple Vitamin (MULTIVITAMIN) capsule Take 1 capsule by mouth daily.    Marland Kitchen omega-3 acid ethyl esters (LOVAZA) 1 G capsule Take 2 capsules (2 g total) by mouth 2 (two) times daily. 120 capsule 3  . omeprazole (PRILOSEC) 40 MG capsule Take 1 capsule (40 mg total) by mouth daily. 30 capsule 3  . potassium chloride SA (K-DUR,KLOR-CON) 20 MEQ tablet Take 1 tablet (20 mEq total) by mouth daily. 90 tablet 1  . Probiotic Product (FLORAJEN BIFIDOBLEND) CAPS Take 1 capsule by mouth daily. 90 capsule 3  . sucralfate (CARAFATE) 1 GM/10ML suspension Take 10 mLs (1 g total) by mouth 4 (four) times daily -  with meals and at bedtime. 420 mL 0  . vitamin B-12 (CYANOCOBALAMIN) 250 MCG tablet Take 250 mcg by mouth daily.     No current facility-administered medications for this visit.   Allergies  Allergen Reactions  . Abilify [Aripiprazole] Other (See Comments)    Burning in Chest Elevated b/p Insomnia     Review of Systems: All systems reviewed and negative except where noted in HPI.    Ct Abdomen Pelvis W Contrast  11/18/2014   CLINICAL DATA:  Nausea, vomiting, and diarrhea.  EXAM: CT ABDOMEN AND PELVIS WITH CONTRAST  TECHNIQUE: Multidetector CT imaging of the abdomen and pelvis was performed using the standard protocol following bolus administration of intravenous contrast.  CONTRAST:  100 cc Omnipaque 300  COMPARISON:  CT scan dated 04/12/2010  FINDINGS: There is edema of the duodenal bulb and second and third portions of the duodenum with slight adjacent soft tissue stranding. The adjacent pancreas appears normal. Gallbladder has been removed. Liver is normal. Minimal prominence of the biliary  tree, not felt to be significant in this post cholecystectomy patient.  The spleen and adrenal glands and kidneys are normal. There has been resection of a portion of the ascending colon and of the small bowel. There are numerous diverticula in the sigmoid portion of the colon without diverticulitis.  Uterus and ovaries have been removed. There is a tiny bubble of air in the bladder. Has the patient been catheterized recently? If not, the possibility of cystitis should be considered. No acute osseous abnormality.  No free  air or free fluid.  Rectal prolapse.  IMPRESSION: Findings of duodenitis. Sigmoid diverticulosis. Single bubble of air in the bladder which could to indicate cystitis if the patient has not been recently catheterized. Rectal prolapse.   Electronically Signed   By: Lorriane Shire M.D.   On: 11/18/2014 02:10    Physical Exam: BP 116/60 mmHg  Pulse 68  Ht 4' 11.5" (1.511 m)  Wt 138 lb (62.596 kg)  BMI 27.42 kg/m2 Constitutional: Pleasant,well-developed, white female in no acute distress. HEENT: Normocephalic and atraumatic. Conjunctivae are normal. No scleral icterus. Neck supple.  Cardiovascular: Normal rate, regular rhythm.  Pulmonary/chest: Effort normal and breath sounds normal. No wheezing, rales or rhonchi. Abdominal: Soft, nondistended, nontender. Bowel sounds active throughout. There are no masses palpable. No hepatomegaly. Rectal: light brown, heme negative stool Extremities: no edema Lymphadenopathy: No cervical adenopathy noted. Neurological: Alert and oriented to person place and time. Skin: Skin is warm and dry. No rashes noted. Psychiatric: Normal mood and affect. Behavior is normal.   ASSESSMENT AND PLAN:   52. 80 year old female with ACUTE nausea, vomiting, upper abdominal pain, dark stools and positive hemoccult at PCPs office. CT scan suggests duodenitis with edema of bulb as well as the second and third portions of the duodenum.  Rule out infectious  etiology,? Ischemia.   Patient feeling much better on Cipro.. On my exam stools are light brown, heme-negative.  At this point will   recheck CBC.   Continue PPI. She already avoids NSAIDS with the exception of baby aspirin.   Will call her in a couple of days with CBC results and to get a condition update.   Since nausea has resolved will decrease Reglan to 5 mg twice daily as needed.   Need for upper endoscopy will depend on clinical course.   2. Positive IFOB in October. Patient is s/p remote colon resection for peritoneal cancer.. She had a normal colonoscopy (to anastomosis) in January 2013 in [place]. Will scan report into EPIC.  Cc: Lorane Gell, NP

## 2014-11-20 NOTE — Patient Instructions (Addendum)
Please take Reglan only as needed   Your physician has requested that you go to the basement for the following lab work before leaving today: CBC-We will contact you regarding lab results

## 2014-11-22 DIAGNOSIS — R933 Abnormal findings on diagnostic imaging of other parts of digestive tract: Secondary | ICD-10-CM | POA: Insufficient documentation

## 2014-11-22 DIAGNOSIS — R112 Nausea with vomiting, unspecified: Secondary | ICD-10-CM | POA: Insufficient documentation

## 2014-11-22 DIAGNOSIS — R1013 Epigastric pain: Secondary | ICD-10-CM | POA: Insufficient documentation

## 2014-11-24 NOTE — Progress Notes (Signed)
Agree with initial assessment and plans 

## 2014-12-01 ENCOUNTER — Telehealth: Payer: Self-pay | Admitting: Nurse Practitioner

## 2014-12-01 NOTE — Telephone Encounter (Signed)
Nevin Bloodgood please review labs from 4/25 what am I telling her?

## 2014-12-03 NOTE — Telephone Encounter (Signed)
Her hgb was bounced around a little but not overly concerning. Duodenal process seen on CTscan presumed infectious. If no recurrent pain, black stool or nausea then she doesn't need a follow up appointment. A repeat CBC in a couple of weeks is reasonable to make sure hgb stabilizes. Thanks

## 2014-12-04 NOTE — Telephone Encounter (Signed)
Patient notified She will have CBC with her primary care No further problems and states she is doing well She will call back for any additional questions or concerns.

## 2014-12-05 LAB — POCT URINALYSIS DIPSTICK
Bilirubin, UA: NEGATIVE
Blood, UA: NEGATIVE
Glucose, UA: NEGATIVE
Ketones, UA: NEGATIVE
Leukocytes, UA: NEGATIVE
Nitrite, UA: NEGATIVE
Spec Grav, UA: >=1.03
Urobilinogen, UA: NEGATIVE
pH, UA: 5.5

## 2014-12-05 NOTE — Addendum Note (Signed)
Addended by: Lurlean Nanny on: 12/05/2014 12:07 PM   Modules accepted: Orders

## 2015-01-01 DIAGNOSIS — M1711 Unilateral primary osteoarthritis, right knee: Secondary | ICD-10-CM | POA: Diagnosis not present

## 2015-01-01 DIAGNOSIS — S83231D Complex tear of medial meniscus, current injury, right knee, subsequent encounter: Secondary | ICD-10-CM | POA: Diagnosis not present

## 2015-01-08 DIAGNOSIS — H52223 Regular astigmatism, bilateral: Secondary | ICD-10-CM | POA: Diagnosis not present

## 2015-01-08 DIAGNOSIS — H524 Presbyopia: Secondary | ICD-10-CM | POA: Diagnosis not present

## 2015-01-08 DIAGNOSIS — H35443 Age-related reticular degeneration of retina, bilateral: Secondary | ICD-10-CM | POA: Diagnosis not present

## 2015-01-08 DIAGNOSIS — H2513 Age-related nuclear cataract, bilateral: Secondary | ICD-10-CM | POA: Diagnosis not present

## 2015-01-08 DIAGNOSIS — H5201 Hypermetropia, right eye: Secondary | ICD-10-CM | POA: Diagnosis not present

## 2015-01-10 ENCOUNTER — Ambulatory Visit (INDEPENDENT_AMBULATORY_CARE_PROVIDER_SITE_OTHER): Payer: Medicare Other | Admitting: Nurse Practitioner

## 2015-01-10 VITALS — BP 120/66 | HR 73 | Temp 98.4°F | Resp 16 | Ht 60.0 in | Wt 134.8 lb

## 2015-01-10 DIAGNOSIS — I2510 Atherosclerotic heart disease of native coronary artery without angina pectoris: Secondary | ICD-10-CM | POA: Diagnosis not present

## 2015-01-10 DIAGNOSIS — F419 Anxiety disorder, unspecified: Secondary | ICD-10-CM | POA: Diagnosis not present

## 2015-01-10 DIAGNOSIS — D500 Iron deficiency anemia secondary to blood loss (chronic): Secondary | ICD-10-CM | POA: Diagnosis not present

## 2015-01-10 LAB — CBC WITH DIFFERENTIAL/PLATELET
Basophils Absolute: 0 10*3/uL (ref 0.0–0.1)
Basophils Relative: 0.3 % (ref 0.0–3.0)
Eosinophils Absolute: 0.1 10*3/uL (ref 0.0–0.7)
Eosinophils Relative: 1.2 % (ref 0.0–5.0)
HCT: 37.9 % (ref 36.0–46.0)
Hemoglobin: 12.5 g/dL (ref 12.0–15.0)
Lymphocytes Relative: 30.9 % (ref 12.0–46.0)
Lymphs Abs: 2.8 10*3/uL (ref 0.7–4.0)
MCHC: 32.8 g/dL (ref 30.0–36.0)
MCV: 89.7 fl (ref 78.0–100.0)
Monocytes Absolute: 0.6 10*3/uL (ref 0.1–1.0)
Monocytes Relative: 7.1 % (ref 3.0–12.0)
Neutro Abs: 5.5 10*3/uL (ref 1.4–7.7)
Neutrophils Relative %: 60.5 % (ref 43.0–77.0)
Platelets: 200 10*3/uL (ref 150.0–400.0)
RBC: 4.23 Mil/uL (ref 3.87–5.11)
RDW: 14.4 % (ref 11.5–15.5)
WBC: 9.1 10*3/uL (ref 4.0–10.5)

## 2015-01-10 MED ORDER — MIRTAZAPINE 7.5 MG PO TABS: 7.5000 mg | ORAL_TABLET | Freq: Every day | ORAL | Status: DC

## 2015-01-10 MED ORDER — CITALOPRAM HYDROBROMIDE 20 MG PO TABS: ORAL_TABLET | ORAL | Status: DC

## 2015-01-10 MED ORDER — LOVASTATIN 20 MG PO TABS: 20.0000 mg | ORAL_TABLET | Freq: Every day | ORAL | Status: DC

## 2015-01-10 MED ORDER — ISOSORBIDE DINITRATE 30 MG PO TABS: 30.0000 mg | ORAL_TABLET | Freq: Every day | ORAL | Status: DC

## 2015-01-10 MED ORDER — OMEGA-3-ACID ETHYL ESTERS 1 G PO CAPS: 2.0000 g | ORAL_CAPSULE | Freq: Two times a day (BID) | ORAL | Status: DC

## 2015-01-10 MED ORDER — BUSPIRONE HCL 5 MG PO TABS: 5.0000 mg | ORAL_TABLET | Freq: Two times a day (BID) | ORAL | Status: DC

## 2015-01-10 MED ORDER — METOPROLOL TARTRATE 50 MG PO TABS: 50.0000 mg | ORAL_TABLET | Freq: Two times a day (BID) | ORAL | Status: DC

## 2015-01-10 NOTE — Progress Notes (Signed)
Pre visit review using our clinic review tool, if applicable. No additional management support is needed unless otherwise documented below in the visit note. 

## 2015-01-10 NOTE — Progress Notes (Signed)
   Subjective:    Patient ID: Amy Morton, female    DOB: 1935/07/19, 80 y.o.   MRN: [id number]  HPI  Amy Morton is a 80 yo female with a CC of refills and follow up from GI.   1) Wants 90 day supply on Mirtazipine, Buspirone, Metoprolol, Lovastatin, Citalopram   2) Doing okay after her visit to GI. Pt had abdominal pain, N/V, and blood in stool. Saw Aline Brochure, NP. She is continuing her PPI, no NSAIDs, okay to continue ASA 81 mg, Reglan 5 mg twice daily as needed for nausea, no endoscopy scheduled yet. They will follow up   Needs certain lab work (cbc w. Diff today)  Review of Systems  Constitutional: Negative for fever, chills, diaphoresis and fatigue.  Respiratory: Negative for chest tightness, shortness of breath and wheezing.   Cardiovascular: Negative for chest pain, palpitations and leg swelling.  Gastrointestinal: Negative for nausea, vomiting, diarrhea and blood in stool.  Skin: Negative for rash.  Neurological: Negative for dizziness, weakness, numbness and headaches.  Psychiatric/Behavioral: The patient is not nervous/anxious.       Objective:   Physical Exam  Constitutional: She is oriented to person, place, and time. She appears well-developed and well-nourished. No distress.  BP 120/66 mmHg  Pulse 73  Temp(Src) 98.4 F (36.9 C)  Resp 16  Ht 5' (1.524 m)  Wt 134 lb 12.8 oz (61.145 kg)  BMI 26.33 kg/m2  SpO2 95%   HENT:  Head: Normocephalic and atraumatic.  Right Ear: External ear normal.  Left Ear: External ear normal.  Cardiovascular: Normal rate, regular rhythm and normal heart sounds.   Pulmonary/Chest: Effort normal and breath sounds normal. No respiratory distress. She has no wheezes. She has no rales. She exhibits no tenderness.  Neurological: She is alert and oriented to person, place, and time. No cranial nerve deficit. She exhibits normal muscle tone. Coordination normal.  Skin: Skin is warm and dry. No rash noted. She is not diaphoretic.    Psychiatric: She has a normal mood and affect. Her behavior is normal. Judgment and thought content normal.      Assessment & Plan:

## 2015-01-10 NOTE — Patient Instructions (Addendum)
Please visit the lab before leaving today.   Let us know if your pharmacy has other concerns about your prescriptions.   Follow up in 6 months.

## 2015-01-12 ENCOUNTER — Ambulatory Visit: Payer: Medicare Other | Admitting: Nurse Practitioner

## 2015-01-13 ENCOUNTER — Encounter: Payer: Self-pay | Admitting: Nurse Practitioner

## 2015-01-13 DIAGNOSIS — D500 Iron deficiency anemia secondary to blood loss (chronic): Secondary | ICD-10-CM | POA: Insufficient documentation

## 2015-01-13 NOTE — Assessment & Plan Note (Signed)
Refills of Busprione and citalopram. Pt stable. No further complaints

## 2015-01-13 NOTE — Assessment & Plan Note (Signed)
Will obtain CBC w/ diff today. No complaints of further gastrointestinal issues. She was pleased with her care.

## 2015-01-13 NOTE — Assessment & Plan Note (Signed)
Metoprolol refilled and Lovastatin.  Lab Results  Component Value Date   CHOL 148 10/19/2014   HDL 48 10/19/2014   LDLCALC 61 10/19/2014   TRIG 193* 10/19/2014   CHOLHDL 3.1 10/19/2014   BP Readings from Last 3 Encounters:  01/10/15 120/66  11/20/14 116/60  11/17/14 126/80

## 2015-03-12 ENCOUNTER — Other Ambulatory Visit: Payer: Self-pay | Admitting: Internal Medicine

## 2015-04-24 DIAGNOSIS — L570 Actinic keratosis: Secondary | ICD-10-CM | POA: Diagnosis not present

## 2015-04-24 DIAGNOSIS — Z85828 Personal history of other malignant neoplasm of skin: Secondary | ICD-10-CM | POA: Diagnosis not present

## 2015-04-24 DIAGNOSIS — L820 Inflamed seborrheic keratosis: Secondary | ICD-10-CM | POA: Diagnosis not present

## 2015-04-24 DIAGNOSIS — L814 Other melanin hyperpigmentation: Secondary | ICD-10-CM | POA: Diagnosis not present

## 2015-04-24 DIAGNOSIS — D1800 Hemangioma unspecified site: Secondary | ICD-10-CM | POA: Diagnosis not present

## 2015-04-24 DIAGNOSIS — L821 Other seborrheic keratosis: Secondary | ICD-10-CM | POA: Diagnosis not present

## 2015-04-24 DIAGNOSIS — L578 Other skin changes due to chronic exposure to nonionizing radiation: Secondary | ICD-10-CM | POA: Diagnosis not present

## 2015-04-24 DIAGNOSIS — L300 Nummular dermatitis: Secondary | ICD-10-CM | POA: Diagnosis not present

## 2015-04-24 DIAGNOSIS — Z1283 Encounter for screening for malignant neoplasm of skin: Secondary | ICD-10-CM | POA: Diagnosis not present

## 2015-04-24 DIAGNOSIS — D229 Melanocytic nevi, unspecified: Secondary | ICD-10-CM | POA: Diagnosis not present

## 2015-04-25 ENCOUNTER — Other Ambulatory Visit: Payer: Self-pay

## 2015-04-25 ENCOUNTER — Ambulatory Visit (INDEPENDENT_AMBULATORY_CARE_PROVIDER_SITE_OTHER): Payer: Medicare Other | Admitting: Cardiovascular Disease

## 2015-04-25 ENCOUNTER — Encounter: Payer: Self-pay | Admitting: Cardiovascular Disease

## 2015-04-25 VITALS — BP 108/60 | HR 71 | Ht 59.0 in | Wt 142.5 lb

## 2015-04-25 DIAGNOSIS — Z01812 Encounter for preprocedural laboratory examination: Secondary | ICD-10-CM

## 2015-04-25 DIAGNOSIS — I200 Unstable angina: Secondary | ICD-10-CM | POA: Diagnosis not present

## 2015-04-25 DIAGNOSIS — R0602 Shortness of breath: Secondary | ICD-10-CM

## 2015-04-25 DIAGNOSIS — I2510 Atherosclerotic heart disease of native coronary artery without angina pectoris: Secondary | ICD-10-CM | POA: Diagnosis not present

## 2015-04-25 MED ORDER — NITROGLYCERIN 0.4 MG SL SUBL: 0.4000 mg | SUBLINGUAL_TABLET | SUBLINGUAL | Status: DC | PRN

## 2015-04-25 NOTE — Patient Instructions (Signed)
Medication Instructions:  Your physician recommends that you continue on your current medications as directed. Please refer to the Current Medication list given to you today.   Labwork: Your physician recommends that you have labs today: BMET, CBC, PT/INR   Testing/Procedures: Your physician has requested that you have a cardiac catheterization. Cardiac catheterization is used to diagnose and/or treat various heart conditions. Doctors may recommend this procedure for a number of different reasons. The most common reason is to evaluate chest pain. Chest pain can be a symptom of coronary artery disease (CAD), and cardiac catheterization can show whether plaque is narrowing or blocking your heart's arteries. This procedure is also used to evaluate the valves, as well as measure the blood flow and oxygen levels in different parts of your heart. For further information please visit [email or web address]. Please follow instruction sheet, as given.  [facility] Cardiac Cath Instructions   You are scheduled for a Cardiac Cath on: Friday, September 30, 10:30am  Please arrive at  8:30am on the day of your procedure  Do not eat/drink anything after midnight  Someone will need to drive you home  It is recommended someone be with you for the first 24 hours after your procedure  Wear clothes that are easy to get on/off and wear slip on shoes if possible   Medications bring a current list of all medications with you  __xx_ You may take all of your medications the morning of your procedure with enough water to swallow safely    Day of your procedure: [facility]  Short Stay [place] [phone]  The usual length of stay after your procedure is about 2 to 3 hours.  This can vary.  If you have any questions, please call our office at [phone],.    Follow-Up: Your physician recommends that you schedule a follow-up appointment with Dr. Acie Fredrickson on October 10th  in [place].    Any Other Special Instructions Will Be Listed Below (If Applicable).  Angiogram An angiogram, also called angiography, is a procedure used to look at the blood vessels that carry blood to different parts of your body (arteries). In this procedure, dye is injected through a long, thin tube (catheter) into an artery. X-rays are then taken. The X-rays will show if there is a blockage or problem in a blood vessel.  LET [facility] CARE PROVIDER KNOW ABOUT: 7. Any allergies you have, including allergies to shellfish or contrast dye.  8. All medicines you are taking, including vitamins, herbs, eye drops, creams, and over-the-counter medicines.  9. Previous problems you or members of your family have had with the use of anesthetics.  10. Any blood disorders you have.  11. Previous surgeries you have had. 12. Any previous kidney problems or failure you have had. 13. Medical conditions you have.  14. Possibility of pregnancy, if this applies. RISKS AND COMPLICATIONS Generally, an angiogram is a safe procedure. However, as with any procedure, problems can occur. Possible problems include:  Injury to the blood vessels, including rupture or bleeding.  Infection or bruising at the catheter site.  Allergic reaction to the dye or contrast used.  Kidney damage from the dye or contrast used.  Blood clots that can lead to a stroke or heart attack. BEFORE THE PROCEDURE  Do not eat or drink after midnight on the night before the procedure, or as directed by your health care provider.   Ask your health care provider if you may drink enough water  to take any needed medicines the morning of the procedure.  PROCEDURE  You may be given a medicine to help you relax (sedative) before and during the procedure. This medicine is given through an IV access tube that is inserted into one of your veins.   The area where the catheter will be inserted will be washed and shaved. This is  usually done in the groin but may be done in the fold of your arm (near your elbow) or in the wrist.  A medicine will be given to numb the area where the catheter will be inserted (local anesthetic).  The catheter will be inserted with a guide wire into an artery. The catheter is guided by using a type of X-ray (fluoroscopy) to the blood vessel being examined.   Dye is then injected into the catheter, and X-rays are taken. The dye helps to show where any narrowing or blockages are located.  AFTER THE PROCEDURE   If the procedure is done through the leg, you will be kept in bed lying flat for several hours. You will be instructed to not bend or cross your legs.  The insertion site will be checked frequently.  The pulse in your feet or wrist will be checked frequently.  Additional blood tests, X-rays, and electrocardiography may be done.   You may need to stay in the hospital overnight for observation.  Document Released: 04/23/2005 Document Revised: 07/19/2013 Document Reviewed: 12/15/2012 [facility] Patient Information 2015 [place], [place]. This information is not intended to replace advice given to you by your health care provider. Make sure you discuss any questions you have with your health care provider. Angiogram, Care After Refer to this sheet in the next few weeks. These instructions provide you with information on caring for yourself after your procedure. Your health care provider may also give you more specific instructions. Your treatment has been planned according to current medical practices, but problems sometimes occur. Call your health care provider if you have any problems or questions after your procedure.  WHAT TO EXPECT AFTER THE PROCEDURE After your procedure, it is typical to have the following sensations: 15. Minor discomfort or tenderness and a small bump at the catheter insertion site. The bump should usually decrease in size and tenderness within 1 to 2  weeks. 16. Any bruising will usually fade within 2 to 4 weeks. HOME CARE INSTRUCTIONS   You may need to keep taking blood thinners if they were prescribed for you. Take medicines only as directed by your health care provider.  Do not apply powder or lotion to the site.  Do not take baths, swim, or use a hot tub until your health care provider approves.  You may shower 24 hours after the procedure. Remove the bandage (dressing) and gently wash the site with plain soap and water. Gently pat the site dry.  Inspect the site at least twice daily.  Limit your activity for the first 48 hours. Do not bend, squat, or lift anything over 20 lb (9 kg) or as directed by your health care provider.  Plan to have someone take you home after the procedure. Follow instructions about when you can drive or return to work. SEEK MEDICAL CARE IF:  You get light-headed when standing up.  You have drainage (other than a small amount of blood on the dressing).  You have chills.  You have a fever.  You have redness, warmth, swelling, or pain at the insertion site. SEEK IMMEDIATE MEDICAL CARE IF:  You develop chest pain or shortness of breath, feel faint, or pass out.  You have bleeding, swelling larger than a walnut, or drainage from the catheter insertion site.  You develop pain, discoloration, coldness, or severe bruising in the leg or arm that held the catheter.  You develop bleeding from any other place, such as the bowels. You may see bright red blood in your urine or stools, or your stools may appear black and tarry.  You have heavy bleeding from the site. If this happens, hold pressure on the site. MAKE SURE YOU:  Understand these instructions.  Will watch your condition.  Will get help right away if you are not doing well or get worse. Document Released: 01/30/2005 Document Revised: 11/28/2013 Document Reviewed: 12/06/2012 [facility] Patient Information 2015 [place], [place]. This  information is not intended to replace advice given to you by your health care provider. Make sure you discuss any questions you have with your health care provider.

## 2015-04-25 NOTE — Progress Notes (Signed)
Cardiology Office Note   Date:  04/25/2015   ID:  KELLI ROBECK, DOB Apr 12, 1935, MRN [id number]  PCP:  Rubbie Battiest, NP  Cardiologist:   Thayer Headings, MD   Chief Complaint  Patient presents with  . Follow-up    6 month f/u c/o sob. Meds reviewed verbally with pt.   1. CAD - s/p PTCA - 1996 2. Hyperlipidemia 3. Anxiety/depression 4.  History of Present Illness:  Amy Morton is a 80 yo with hx of CAD - s/p PCI in 1996 [name])  She has been seeing Dr. Rosalita Levan. Wanted to changed cardiologist.  She was recently prescribed Abilify and had a bad reaction. She stated that it made her feel quite bad ( chest burning) She has stopped the medication and now feels quite a bit better. She was seen in the ER and had marked HTN. BP is now better.   She walks daily.  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 28, 2016 Amy Morton Favorite is a 80 y.o. female who presents for CAD and hyperlipidemia. She has been having exertional CP for 3-4 weeks. Tightness, pressure, mid sternal , radiates out across her chest.  No radiation down arm . Associated with increased dyspnea. Does not have any NTG Has been limiting her exertional activites.  Has trouble getting her mail . Feels very similar to her previous episodes of angina prior to PCI.    Past Medical History  Diagnosis Date  . Anxiety   . Arthritis   . Depression   . GERD (gastroesophageal reflux disease)   . Hyperlipidemia   . Hypertension   . Gastric ulcer   . CAD (coronary artery disease)     s/p angioplasty 1996  . History of colonic polyps     Dr. Vira Agar  . Vitamin D deficiency   . Abnormal mammogram 2007    Recommend close follow up  . Shingles 2008    T-9 distribution  . Elevated LFTs 2010    s/p ultrasound w/ possible fatty liver; GI consult. Resolution 2011  . Aortic stenosis     mild to moderate  . Anemia     Iron, b12, SPEP, UPEP normal, 12/2012  . Valvular heart  disease     Mild to Moderate MR, AS  . Asthma   . aortic stenosis   . Peritoneal carcinomatosis 2001    Dr. Oliva Bustard & Dr. Claiborne Rigg s/p chemo    Past Surgical History  Procedure Laterality Date  . Tonsillectomy  1950  . Cholecystectomy  1987  . Colectomy  2001  . Laparoscopic bilateral salpingo oopherectomy  1988    Dr. Laverta Baltimore  . Appendectomy    . Abdominal hysterectomy  2001  . Coronary angioplasty  1996  . Cardiac catheterization  2007    50% mid LAD stenosis, 70% proximal RCA with 100% distal RCA stenosis with collaterals, EF 65%     Current Outpatient Prescriptions  Medication Sig Dispense Refill  . acetaminophen (TYLENOL) 650 MG CR tablet Take 650 mg by mouth as needed for pain.     Marland Kitchen albuterol (PROVENTIL HFA;VENTOLIN HFA) 108 (90 BASE) MCG/ACT inhaler Inhale into the lungs as needed for wheezing or shortness of breath.    Marland Kitchen aspirin 81 MG tablet Take 81 mg by mouth daily.    . busPIRone (BUSPAR) 5 MG tablet Take 1 tablet (5 mg total) by mouth 2 (two) times daily. 180 tablet 3  .  calcium gluconate 650 MG tablet Take 650 mg by mouth 2 (two) times daily.    . Cholecalciferol ([employer]S VIT D 5000 HIGH-POTENCY) 5000 UNITS capsule One capsule every other day (Patient taking differently: Take 5,000 Units by mouth every other day. One capsule every other day)    . citalopram (CELEXA) 20 MG tablet Take 1 tablet daily 90 tablet 3  . Fluticasone-Salmeterol (ADVAIR) 250-50 MCG/DOSE AEPB Inhale 1 puff into the lungs as needed.    . isosorbide dinitrate (ISORDIL) 30 MG tablet Take 1 tablet (30 mg total) by mouth daily. 90 tablet 3  . lovastatin (MEVACOR) 20 MG tablet Take 1 tablet (20 mg total) by mouth at bedtime. 90 tablet 3  . metoCLOPramide (REGLAN) 10 MG tablet Take 10 mg by mouth every 6 (six) hours as needed. Pt takes 1/2 tablet  0  . metoprolol (LOPRESSOR) 50 MG tablet Take 1 tablet (50 mg total) by mouth 2 (two) times daily. 180 tablet 3  . mirtazapine (REMERON) 7.5 MG tablet Take 1  tablet (7.5 mg total) by mouth at bedtime. 90 tablet 3  . Multiple Vitamin (MULTIVITAMIN) capsule Take 1 capsule by mouth daily.    Marland Kitchen omega-3 acid ethyl esters (LOVAZA) 1 G capsule Take 2 capsules (2 g total) by mouth 2 (two) times daily. 360 capsule 3  . omeprazole (PRILOSEC) 40 MG capsule Take 1 capsule (40 mg total) by mouth daily. 30 capsule 3  . potassium chloride SA (K-DUR,KLOR-CON) 20 MEQ tablet Take 1 tablet (20 mEq total) by mouth daily. 90 tablet 1  . Probiotic Product (FLORAJEN BIFIDOBLEND) CAPS Take 1 capsule by mouth daily. 90 capsule 3  . sucralfate (CARAFATE) 1 GM/10ML suspension Take 10 mLs (1 g total) by mouth 4 (four) times daily -  with meals and at bedtime. 420 mL 0  . vitamin B-12 (CYANOCOBALAMIN) 250 MCG tablet Take 250 mcg by mouth daily.     No current facility-administered medications for this visit.    Allergies:   Abilify    Social History:  The patient  reports that she has never smoked. She has never used smokeless tobacco. She reports that she does not drink alcohol or use illicit drugs.   Family History:  The patient's family history includes Cancer in her father; Cancer (age of onset: 57) in her daughter; Diabetes in her brother and sister; Heart disease in her brother; Hypertension in her mother; Stroke (age of onset: 77) in her mother. There is no history of Colon cancer or Colon polyps.    ROS:  Please see the history of present illness.    Review of Systems: Constitutional:  denies fever, chills, diaphoresis, appetite change and fatigue.  HEENT: denies photophobia, eye pain, redness, hearing loss, ear pain, congestion, sore throat, rhinorrhea, sneezing, neck pain, neck stiffness and tinnitus.  Respiratory: denies SOB, DOE, cough, chest tightness, and wheezing.  Cardiovascular: denies chest pain, palpitations and leg swelling.  Gastrointestinal: denies nausea, vomiting, abdominal pain, diarrhea, constipation, blood in stool.  Genitourinary: denies  dysuria, urgency, frequency, hematuria, flank pain and difficulty urinating.  Musculoskeletal: denies  myalgias, back pain, joint swelling, arthralgias and gait problem.   Skin: denies pallor, rash and wound.  Neurological: denies dizziness, seizures, syncope, weakness, light-headedness, numbness and headaches.   Hematological: denies adenopathy, easy bruising, personal or family bleeding history.  Psychiatric/ Behavioral: denies suicidal ideation, mood changes, confusion, nervousness, sleep disturbance and agitation.       All other systems are reviewed and negative.  PHYSICAL EXAM: VS:  BP 108/60 mmHg  Pulse 71  Ht 4\' 11"  (1.499 m)  Wt 64.638 kg (142 lb 8 oz)  BMI 28.77 kg/m2 , BMI Body mass index is 28.77 kg/(m^2). GEN: Well nourished, well developed, in no acute distress HEENT: normal Neck: no JVD, carotid bruits, or masses Cardiac: RRR; no murmurs, rubs, or gallops,no edema  Respiratory:  clear to auscultation bilaterally, normal work of breathing GI: soft, nontender, nondistended, + BS MS: no deformity or atrophy Skin: warm and dry, no rash Neuro:  Strength and sensation are intact Psych: normal   EKG:  EKG is ordered today. The ekg ordered today demonstrates normal sinus rhythm at 65. Poor R wave progression which is likely due to lead placement versus pulmonary disease.   Recent Labs: 05/10/2014: Magnesium 1.5; TSH 1.71 11/17/2014: BUN 15; Creatinine 0.71; Potassium 3.5; SGPT (ALT) 27; Sodium 135 01/10/2015: Hemoglobin 12.5; Platelets 200.0    Lipid Panel    Component Value Date/Time   CHOL 148 10/19/2014 1212   CHOL 144 05/10/2014 0851   TRIG 193* 10/19/2014 1212   HDL 48 10/19/2014 1212   HDL 40.20 05/10/2014 0851   CHOLHDL 3.1 10/19/2014 1212   CHOLHDL 4 05/10/2014 0851   VLDL 15.8 05/10/2014 0851   LDLCALC 61 10/19/2014 1212   LDLCALC 88 05/10/2014 0851      Wt Readings from Last 3 Encounters:  04/25/15 64.638 kg (142 lb 8 oz)  01/10/15 61.145 kg  (134 lb 12.8 oz)  11/20/14 62.596 kg (138 lb)      Other studies Reviewed: Additional studies/ records that were reviewed today include:  Review of the above records demonstrates:    ASSESSMENT AND PLAN:  1. Unstable angina  - s/p PTCA - 1996 -  [place] with symptoms that are consistent with her previous episodes of angina. She now has chest pressure while walking to the mailbox. She has to stop once or twice walking to her mailbox. She is on isosorbide and metoprolol. She does not have regular nitroglycerin.  She she has an occluded distal right coronary artery and also has moderate disease in her LAD and circumflex systems. I do not think that a Myoview study would be of much use. It certainly would be abnormal. Given her symptoms and the fact that her Myoview study will be abnormal, I think our best option is to proceed with cardiac catheterization. We have discussed the risks, benefits, and options concerning cardiac catheterization. She understands and agrees to proceed. We've scheduled at 10:30 on Friday morning with Dr. Irish Lack.  We will get pre-cath labs today . She will follow up with me in 1-2 weeks.   2. Hyperlipidemia - lipds have been well controlled.  Continue current meds.     3. Anxiety/depression followed by her general medical doctor.   Current medicines are reviewed at length with the patient today.  The patient does not have concerns regarding medicines.  The following changes have been made:  no change  Labs/ tests ordered today include:  Orders Placed This Encounter  Procedures  . EKG 12-Lead     Disposition:   FU with me in 1-2 weeks   Signed, Osmel Dykstra, Wonda Cheng, MD  04/25/2015 11:34 AM    [facility] [place], [place], [place]  [address] Phone: [phone]; Fax: [phone]

## 2015-04-26 ENCOUNTER — Telehealth: Payer: Self-pay

## 2015-04-26 NOTE — Telephone Encounter (Signed)
Reviewed cardiac cath instructions w/pt. Pt verbalized understanding and states she has no questions.

## 2015-04-27 ENCOUNTER — Encounter (HOSPITAL_COMMUNITY)
Admission: RE | Disposition: A | Discharge status: Home / Self Care | Payer: Self-pay | Source: Ambulatory Visit | Attending: Cardiovascular Disease

## 2015-04-27 ENCOUNTER — Encounter (HOSPITAL_COMMUNITY): Payer: Self-pay | Admitting: *Deleted

## 2015-04-27 ENCOUNTER — Inpatient Hospital Stay (HOSPITAL_COMMUNITY)
Admission: RE | Admit: 2015-04-27 | Discharge: 2015-04-29 | DRG: 247 | Disposition: A | Discharge status: Home / Self Care | Payer: Medicare Other | Source: Ambulatory Visit | Attending: Cardiovascular Disease | Admitting: Cardiovascular Disease

## 2015-04-27 DIAGNOSIS — Z955 Presence of coronary angioplasty implant and graft: Secondary | ICD-10-CM

## 2015-04-27 DIAGNOSIS — E785 Hyperlipidemia, unspecified: Secondary | ICD-10-CM | POA: Diagnosis present

## 2015-04-27 DIAGNOSIS — I25118 Atherosclerotic heart disease of native coronary artery with other forms of angina pectoris: Secondary | ICD-10-CM

## 2015-04-27 DIAGNOSIS — F329 Major depressive disorder, single episode, unspecified: Secondary | ICD-10-CM | POA: Diagnosis present

## 2015-04-27 DIAGNOSIS — I2510 Atherosclerotic heart disease of native coronary artery without angina pectoris: Secondary | ICD-10-CM

## 2015-04-27 DIAGNOSIS — Z9861 Coronary angioplasty status: Secondary | ICD-10-CM

## 2015-04-27 DIAGNOSIS — Z7982 Long term (current) use of aspirin: Secondary | ICD-10-CM | POA: Diagnosis not present

## 2015-04-27 DIAGNOSIS — Z79899 Other long term (current) drug therapy: Secondary | ICD-10-CM | POA: Diagnosis not present

## 2015-04-27 DIAGNOSIS — I350 Nonrheumatic aortic (valve) stenosis: Secondary | ICD-10-CM | POA: Diagnosis not present

## 2015-04-27 DIAGNOSIS — I25110 Atherosclerotic heart disease of native coronary artery with unstable angina pectoris: Secondary | ICD-10-CM | POA: Diagnosis not present

## 2015-04-27 DIAGNOSIS — K219 Gastro-esophageal reflux disease without esophagitis: Secondary | ICD-10-CM | POA: Diagnosis present

## 2015-04-27 DIAGNOSIS — F419 Anxiety disorder, unspecified: Secondary | ICD-10-CM | POA: Diagnosis present

## 2015-04-27 DIAGNOSIS — I10 Essential (primary) hypertension: Secondary | ICD-10-CM | POA: Diagnosis present

## 2015-04-27 HISTORY — PX: CARDIAC CATHETERIZATION: SHX172

## 2015-04-27 LAB — BASIC METABOLIC PANEL
Anion gap: 7 (ref 5–15)
BUN/Creatinine Ratio: 22 (ref 11–26)
BUN: 15 mg/dL (ref 8–27)
BUN: 12 mg/dL (ref 6–20)
CO2: 21 mmol/L (ref 18–29)
CO2: 25 mmol/L (ref 22–32)
Calcium: 9.5 mg/dL (ref 8.7–10.3)
Calcium: 9 mg/dL (ref 8.9–10.3)
Chloride: 100 mmol/L (ref 97–108)
Chloride: 105 mmol/L (ref 101–111)
Creatinine, Ser: 0.67 mg/dL (ref 0.57–1.00)
Creatinine, Ser: 0.85 mg/dL (ref 0.44–1.00)
GFR calc Af Amer: 97 mL/min/{1.73_m2} (ref >59)
GFR calc Af Amer: >60 mL/min (ref >60)
GFR calc non Af Amer: 84 mL/min/{1.73_m2} (ref >59)
GFR calc non Af Amer: >60 mL/min (ref >60)
Glucose, Bld: 106 mg/dL — ABNORMAL HIGH (ref 65–99)
Glucose: 97 mg/dL (ref 65–99)
Potassium: 4.9 mmol/L (ref 3.5–5.2)
Potassium: 6.9 mmol/L (ref 3.5–5.1)
Sodium: 137 mmol/L (ref 134–144)
Sodium: 137 mmol/L (ref 135–145)

## 2015-04-27 LAB — CBC
HCT: 32.3 % — ABNORMAL LOW (ref 36.0–46.0)
Hematocrit: 32.2 % — ABNORMAL LOW (ref 34.0–46.6)
Hemoglobin: 10.5 g/dL — ABNORMAL LOW (ref 11.1–15.9)
Hemoglobin: 10.3 g/dL — ABNORMAL LOW (ref 12.0–15.0)
MCH: 28.8 pg (ref 26.6–33.0)
MCH: 28.6 pg (ref 26.0–34.0)
MCHC: 32.6 g/dL (ref 31.5–35.7)
MCHC: 31.9 g/dL (ref 30.0–36.0)
MCV: 89 fL (ref 79–97)
MCV: 89.7 fL (ref 78.0–100.0)
Platelets: 178 10*3/uL (ref 150–379)
Platelets: 139 10*3/uL — ABNORMAL LOW (ref 150–400)
RBC: 3.64 x10E6/uL — ABNORMAL LOW (ref 3.77–5.28)
RBC: 3.6 MIL/uL — ABNORMAL LOW (ref 3.87–5.11)
RDW: 15.6 % — ABNORMAL HIGH (ref 12.3–15.4)
RDW: 14.8 % (ref 11.5–15.5)
WBC: 5.8 10*3/uL (ref 3.4–10.8)
WBC: 5.5 10*3/uL (ref 4.0–10.5)

## 2015-04-27 LAB — POCT ACTIVATED CLOTTING TIME
Activated Clotting Time: 239 seconds
Activated Clotting Time: 245 seconds

## 2015-04-27 LAB — GLUCOSE, CAPILLARY: Glucose-Capillary: 115 mg/dL — ABNORMAL HIGH (ref 65–99)

## 2015-04-27 LAB — PLATELET COUNT: Platelets: 157 10*3/uL (ref 150–400)

## 2015-04-27 LAB — MRSA PCR SCREENING: MRSA by PCR: NEGATIVE

## 2015-04-27 LAB — PROTIME-INR
INR: 1.1 (ref 0.8–1.2)
INR: 1.21 (ref 0.00–1.49)
Prothrombin Time: 11.2 s (ref 9.1–12.0)
Prothrombin Time: 15.5 seconds — ABNORMAL HIGH (ref 11.6–15.2)

## 2015-04-27 LAB — POTASSIUM: Potassium: 4.1 mmol/L (ref 3.5–5.1)

## 2015-04-27 SURGERY — LEFT HEART CATH AND CORONARY ANGIOGRAPHY

## 2015-04-27 MED ORDER — SODIUM CHLORIDE 0.9 % WEIGHT BASED INFUSION
3.0000 mL/kg/h | INTRAVENOUS | Status: DC
Start: 2015-04-28 — End: 2015-04-27
  Administered 2015-04-27: 3 mL/kg/h via INTRAVENOUS

## 2015-04-27 MED ORDER — ONDANSETRON HCL 4 MG/2ML IJ SOLN
4.0000 mg | Freq: Four times a day (QID) | INTRAMUSCULAR | Status: DC | PRN
  Administered 2015-04-27: 4 mg via INTRAVENOUS
  Filled 2015-04-27: qty 2

## 2015-04-27 MED ORDER — SODIUM CHLORIDE 0.9 % IJ SOLN
3.0000 mL | Freq: Two times a day (BID) | INTRAMUSCULAR | Status: DC
  Administered 2015-04-28 – 2015-04-29 (×3): 3 mL via INTRAVENOUS

## 2015-04-27 MED ORDER — MIRTAZAPINE 7.5 MG PO TABS
7.5000 mg | ORAL_TABLET | Freq: Every day | ORAL | Status: DC
  Administered 2015-04-27 – 2015-04-28 (×2): 7.5 mg via ORAL
  Filled 2015-04-27 (×3): qty 1

## 2015-04-27 MED ORDER — CLOPIDOGREL BISULFATE 300 MG PO TABS
ORAL_TABLET | ORAL | Status: AC
  Filled 2015-04-27: qty 1

## 2015-04-27 MED ORDER — ISOSORBIDE MONONITRATE ER 30 MG PO TB24
30.0000 mg | ORAL_TABLET | Freq: Every day | ORAL | Status: DC
  Administered 2015-04-28 – 2015-04-29 (×2): 30 mg via ORAL
  Filled 2015-04-27 (×2): qty 1

## 2015-04-27 MED ORDER — SODIUM CHLORIDE 0.9 % IJ SOLN: 3.0000 mL | Freq: Two times a day (BID) | INTRAMUSCULAR | Status: DC

## 2015-04-27 MED ORDER — TIROFIBAN (AGGRASTAT) BOLUS VIA INFUSION
INTRAVENOUS | Status: DC | PRN
  Administered 2015-04-27: 1587.5 ug via INTRAVENOUS

## 2015-04-27 MED ORDER — ADENOSINE 12 MG/4ML IV SOLN
12.0000 mL | Freq: Once | INTRAVENOUS | Status: DC
  Filled 2015-04-27: qty 12

## 2015-04-27 MED ORDER — VITAMIN B-12 250 MCG PO TABS
250.0000 ug | ORAL_TABLET | Freq: Every day | ORAL | Status: DC
  Administered 2015-04-28: 250 ug via ORAL
  Filled 2015-04-27 (×2): qty 1

## 2015-04-27 MED ORDER — HEPARIN (PORCINE) IN NACL 2-0.9 UNIT/ML-% IJ SOLN
INTRAMUSCULAR | Status: AC
  Filled 2015-04-27: qty 1000

## 2015-04-27 MED ORDER — LIDOCAINE HCL (PF) 1 % IJ SOLN
INTRAMUSCULAR | Status: DC | PRN
  Administered 2015-04-27: 2 mL via SUBCUTANEOUS

## 2015-04-27 MED ORDER — HEPARIN SODIUM (PORCINE) 1000 UNIT/ML IJ SOLN
INTRAMUSCULAR | Status: AC
  Filled 2015-04-27: qty 1

## 2015-04-27 MED ORDER — VERAPAMIL HCL 2.5 MG/ML IV SOLN
INTRAVENOUS | Status: AC
  Filled 2015-04-27: qty 2

## 2015-04-27 MED ORDER — IOHEXOL 350 MG/ML SOLN
INTRAVENOUS | Status: DC | PRN
  Administered 2015-04-27: 160 mL via INTRACARDIAC

## 2015-04-27 MED ORDER — SUCRALFATE 1 GM/10ML PO SUSP
1.0000 g | Freq: Three times a day (TID) | ORAL | Status: DC
  Administered 2015-04-28 – 2015-04-29 (×2): 1 g via ORAL
  Filled 2015-04-27 (×8): qty 10

## 2015-04-27 MED ORDER — CLOPIDOGREL BISULFATE 300 MG PO TABS
ORAL_TABLET | ORAL | Status: DC | PRN
  Administered 2015-04-27: 600 mg via ORAL

## 2015-04-27 MED ORDER — CLOPIDOGREL BISULFATE 75 MG PO TABS
75.0000 mg | ORAL_TABLET | Freq: Every day | ORAL | Status: DC
  Administered 2015-04-28 – 2015-04-29 (×2): 75 mg via ORAL
  Filled 2015-04-27 (×2): qty 1

## 2015-04-27 MED ORDER — MOMETASONE FURO-FORMOTEROL FUM 100-5 MCG/ACT IN AERO
2.0000 | INHALATION_SPRAY | Freq: Two times a day (BID) | RESPIRATORY_TRACT | Status: DC
  Administered 2015-04-28 – 2015-04-29 (×3): 2 via RESPIRATORY_TRACT
  Filled 2015-04-27: qty 8.8

## 2015-04-27 MED ORDER — PRAVASTATIN SODIUM 20 MG PO TABS
20.0000 mg | ORAL_TABLET | Freq: Every day | ORAL | Status: DC
  Administered 2015-04-27: 20 mg via ORAL
  Filled 2015-04-27 (×2): qty 1

## 2015-04-27 MED ORDER — SODIUM CHLORIDE 0.9 % IV SOLN: 250.0000 mL | INTRAVENOUS | Status: DC | PRN

## 2015-04-27 MED ORDER — RISAQUAD PO CAPS
1.0000 | ORAL_CAPSULE | Freq: Every day | ORAL | Status: DC
  Administered 2015-04-28 – 2015-04-29 (×2): 1 via ORAL
  Filled 2015-04-27 (×2): qty 1

## 2015-04-27 MED ORDER — ASPIRIN 81 MG PO CHEW
CHEWABLE_TABLET | ORAL | Status: AC
  Filled 2015-04-27: qty 1

## 2015-04-27 MED ORDER — HEPARIN SODIUM (PORCINE) 1000 UNIT/ML IJ SOLN
INTRAMUSCULAR | Status: DC | PRN
  Administered 2015-04-27: 2000 [IU] via INTRAVENOUS
  Administered 2015-04-27: 3500 [IU] via INTRAVENOUS
  Administered 2015-04-27: 5000 [IU] via INTRAVENOUS
  Administered 2015-04-27: 4000 [IU] via INTRAVENOUS

## 2015-04-27 MED ORDER — FLORAJEN BIFIDOBLEND PO CAPS: 1.0000 | ORAL_CAPSULE | Freq: Every day | ORAL | Status: DC

## 2015-04-27 MED ORDER — SODIUM CHLORIDE 0.9 % IJ SOLN: 3.0000 mL | INTRAMUSCULAR | Status: DC | PRN

## 2015-04-27 MED ORDER — ASPIRIN 81 MG PO CHEW
81.0000 mg | CHEWABLE_TABLET | ORAL | Status: AC
  Administered 2015-04-27: 81 mg via ORAL

## 2015-04-27 MED ORDER — FENTANYL CITRATE (PF) 100 MCG/2ML IJ SOLN
INTRAMUSCULAR | Status: DC | PRN
  Administered 2015-04-27 (×2): 25 ug via INTRAVENOUS

## 2015-04-27 MED ORDER — OMEGA-3-ACID ETHYL ESTERS 1 G PO CAPS
2.0000 g | ORAL_CAPSULE | Freq: Two times a day (BID) | ORAL | Status: DC
Start: 2015-04-27 — End: 2015-04-29
  Administered 2015-04-27 – 2015-04-29 (×4): 2 g via ORAL
  Filled 2015-04-27 (×4): qty 2

## 2015-04-27 MED ORDER — BOOST / RESOURCE BREEZE PO LIQD
1.0000 | Freq: Three times a day (TID) | ORAL | Status: DC
  Administered 2015-04-27 – 2015-04-29 (×3): 1 via ORAL

## 2015-04-27 MED ORDER — POTASSIUM CHLORIDE CRYS ER 20 MEQ PO TBCR
20.0000 meq | EXTENDED_RELEASE_TABLET | Freq: Every day | ORAL | Status: DC
  Administered 2015-04-28 – 2015-04-29 (×2): 20 meq via ORAL
  Filled 2015-04-27 (×2): qty 1

## 2015-04-27 MED ORDER — LIDOCAINE HCL (PF) 1 % IJ SOLN
INTRAMUSCULAR | Status: AC
  Filled 2015-04-27: qty 30

## 2015-04-27 MED ORDER — MIDAZOLAM HCL 2 MG/2ML IJ SOLN
INTRAMUSCULAR | Status: AC
  Filled 2015-04-27: qty 4

## 2015-04-27 MED ORDER — HYDRALAZINE HCL 20 MG/ML IJ SOLN
INTRAMUSCULAR | Status: AC
  Filled 2015-04-27: qty 1

## 2015-04-27 MED ORDER — ALPRAZOLAM 0.5 MG PO TABS
0.5000 mg | ORAL_TABLET | Freq: Two times a day (BID) | ORAL | Status: DC | PRN
  Administered 2015-04-28: 0.5 mg via ORAL
  Filled 2015-04-27 (×2): qty 1

## 2015-04-27 MED ORDER — SODIUM CHLORIDE 0.9 % IV SOLN
250.0000 mL | INTRAVENOUS | Status: DC | PRN
  Administered 2015-04-28: 250 mL via INTRAVENOUS

## 2015-04-27 MED ORDER — MORPHINE SULFATE (PF) 2 MG/ML IV SOLN
1.0000 mg | INTRAVENOUS | Status: DC | PRN
  Administered 2015-04-27: 1 mg via INTRAVENOUS
  Administered 2015-04-27: 2 mg via INTRAVENOUS
  Filled 2015-04-27 (×2): qty 1

## 2015-04-27 MED ORDER — BUSPIRONE HCL 5 MG PO TABS
5.0000 mg | ORAL_TABLET | Freq: Two times a day (BID) | ORAL | Status: DC
  Administered 2015-04-27 – 2015-04-29 (×4): 5 mg via ORAL
  Filled 2015-04-27 (×6): qty 1

## 2015-04-27 MED ORDER — SODIUM CHLORIDE 0.9 % WEIGHT BASED INFUSION: 1.0000 mL/kg/h | INTRAVENOUS | Status: AC

## 2015-04-27 MED ORDER — HYDRALAZINE HCL 20 MG/ML IJ SOLN
10.0000 mg | Freq: Four times a day (QID) | INTRAMUSCULAR | Status: DC | PRN
  Administered 2015-04-27: 10 mg via INTRAVENOUS

## 2015-04-27 MED ORDER — CITALOPRAM HYDROBROMIDE 20 MG PO TABS
20.0000 mg | ORAL_TABLET | Freq: Every day | ORAL | Status: DC
  Administered 2015-04-27 – 2015-04-29 (×3): 20 mg via ORAL
  Filled 2015-04-27 (×3): qty 1

## 2015-04-27 MED ORDER — TIROFIBAN HCL IV 12.5 MG/250 ML
INTRAVENOUS | Status: AC
  Filled 2015-04-27: qty 250

## 2015-04-27 MED ORDER — NITROGLYCERIN 0.4 MG SL SUBL: 0.4000 mg | SUBLINGUAL_TABLET | SUBLINGUAL | Status: DC | PRN

## 2015-04-27 MED ORDER — MIDAZOLAM HCL 2 MG/2ML IJ SOLN
INTRAMUSCULAR | Status: DC | PRN
  Administered 2015-04-27 (×2): 1 mg via INTRAVENOUS

## 2015-04-27 MED ORDER — ASPIRIN EC 81 MG PO TBEC
81.0000 mg | DELAYED_RELEASE_TABLET | Freq: Every day | ORAL | Status: DC
  Administered 2015-04-28 – 2015-04-29 (×2): 81 mg via ORAL
  Filled 2015-04-27 (×2): qty 1

## 2015-04-27 MED ORDER — ALBUTEROL SULFATE (2.5 MG/3ML) 0.083% IN NEBU: 2.5000 mg | INHALATION_SOLUTION | RESPIRATORY_TRACT | Status: DC | PRN

## 2015-04-27 MED ORDER — FENTANYL CITRATE (PF) 100 MCG/2ML IJ SOLN
INTRAMUSCULAR | Status: AC
  Filled 2015-04-27: qty 4

## 2015-04-27 MED ORDER — ADENOSINE (DIAGNOSTIC) 140MCG/KG/MIN
INTRAVENOUS | Status: DC | PRN
  Administered 2015-04-27: 140 ug/kg/min via INTRAVENOUS

## 2015-04-27 MED ORDER — SODIUM CHLORIDE 0.9 % WEIGHT BASED INFUSION: 1.0000 mL/kg/h | INTRAVENOUS | Status: DC

## 2015-04-27 MED ORDER — ACETAMINOPHEN 325 MG PO TABS
650.0000 mg | ORAL_TABLET | ORAL | Status: DC | PRN
  Administered 2015-04-27: 650 mg via ORAL

## 2015-04-27 MED ORDER — TIROFIBAN HCL IV 12.5 MG/250 ML
INTRAVENOUS | Status: DC | PRN
  Administered 2015-04-27: 0.075 ug/kg/min via INTRAVENOUS

## 2015-04-27 MED ORDER — METOPROLOL TARTRATE 50 MG PO TABS
50.0000 mg | ORAL_TABLET | Freq: Two times a day (BID) | ORAL | Status: DC
  Administered 2015-04-27 – 2015-04-29 (×4): 50 mg via ORAL
  Filled 2015-04-27 (×4): qty 1

## 2015-04-27 MED ORDER — ASPIRIN 81 MG PO CHEW: 81.0000 mg | CHEWABLE_TABLET | Freq: Every day | ORAL | Status: DC

## 2015-04-27 SURGICAL SUPPLY — 26 items
BALLN MINITREK RX 1.5X15 (BALLOONS) ×3
BALLN ~~LOC~~ EUPHORA RX 3.25X8 (BALLOONS) ×3
BALLOON MINITREK RX 1.5X15 (BALLOONS) ×1 IMPLANT
BALLOON ~~LOC~~ EUPHORA RX 3.25X8 (BALLOONS) ×1 IMPLANT
CATH INFINITI 5 FR JL3.5 (CATHETERS) ×3 IMPLANT
CATH INFINITI 5FR ANG PIGTAIL (CATHETERS) ×3 IMPLANT
CATH INFINITI JR4 5F (CATHETERS) ×3 IMPLANT
CATH LAUNCHER 5F EBU3.0 (CATHETERS) ×1 IMPLANT
CATHETER LAUNCHER 5F EBU3.0 (CATHETERS) ×3
DEVICE RAD COMP TR BAND LRG (VASCULAR PRODUCTS) ×3 IMPLANT
GLIDESHEATH SLEND SS 6F .021 (SHEATH) ×3 IMPLANT
GUIDEWIRE PRESSURE COMET II (WIRE) ×3 IMPLANT
KIT ENCORE 26 ADVANTAGE (KITS) ×3 IMPLANT
KIT ESSENTIALS PG (KITS) ×3 IMPLANT
KIT HEART LEFT (KITS) ×3 IMPLANT
PACK CARDIAC CATHETERIZATION (CUSTOM PROCEDURE TRAY) ×3 IMPLANT
STENT SYNERGY DES 2.50X8 (Permanent Stent) ×3 IMPLANT
STENT SYNERGY DES 3X12 (Permanent Stent) ×3 IMPLANT
SYR MEDRAD MARK V 150ML (SYRINGE) ×3 IMPLANT
TRANSDUCER W/STOPCOCK (MISCELLANEOUS) ×3 IMPLANT
TUBING CIL FLEX 10 FLL-RA (TUBING) ×3 IMPLANT
VALVE GUARDIAN II ~~LOC~~ HEMO (MISCELLANEOUS) ×3 IMPLANT
WIRE ASAHI PROWATER 180CM (WIRE) ×3 IMPLANT
WIRE HI TORQ BMW 190CM (WIRE) ×6 IMPLANT
WIRE HI TORQ VERSACORE-J 145CM (WIRE) ×3 IMPLANT
WIRE SAFE-T 1.5MM-J .035X260CM (WIRE) ×3 IMPLANT

## 2015-04-27 NOTE — Interval H&P Note (Signed)
Cath Lab Visit (complete for each Cath Lab visit)  Clinical Evaluation Leading to the Procedure:   ACS: No.  Non-ACS:    Anginal Classification: CCS III  Anti-ischemic medical therapy: Maximal Therapy (2 or more classes of medications)  Non-Invasive Test Results: No non-invasive testing performed  Prior CABG: No previous CABG      History and Physical Interval Note:  04/27/2015 10:30 AM  Amy Morton  has presented today for surgery, with the diagnosis of cad / unstable angina  The various methods of treatment have been discussed with the patient and family. After consideration of risks, benefits and other options for treatment, the patient has consented to  Procedure(s): Left Heart Cath and Coronary Angiography (N/A) as a surgical intervention .  The patient's history has been reviewed, patient examined, no change in status, stable for surgery.  I have reviewed the patient's chart and labs.  Questions were answered to the patient's satisfaction.     Jeremie Giangrande S.

## 2015-04-27 NOTE — Progress Notes (Signed)
Called to check right radial cath site , with TR band in place. Hematoma proximal to band aprox 1 inch by 4 inches. Ecchymosis present mid forearm. Manual blood pressure cuff placed proximal to tr band and inflated for 10 minutes, removing at 18:56, whle maintaining spo2 pleth waveform measured on right thumb. Ace wrap bandage applied proximal to tr band on right forearm.  Site rechecked at 19:30 , hematoma softer, ace bandage loosened.

## 2015-04-27 NOTE — Progress Notes (Signed)
At 1935 patient feeling anxious, light-headed, and SOB. BP 145/62, O2 97% (started 2L [place] for comfort), CBG 115, HR  84 NSR. Lungs clear. Patient says she feels similar to when she has anxiety attacks. Cardiology paged, Xanax ordered prn. At [place] patient vomited 527ml of bile. Zofran given. At 2030 patient feels better and denies anxiety, SOB, and light-headedness. Will continue to monitor. Xanax not given.

## 2015-04-27 NOTE — Progress Notes (Signed)
Patient complaining of 10/10 headache. Unchanged after acetaminophen and morphine PRN. BP also elevated. [place], PA made aware, orders for hydralazine PRN. Will continue to monitor.

## 2015-04-27 NOTE — Progress Notes (Signed)
After attempting to remove TR band per protocol, patient started to actively rebleed from cath site. Pressure applied to site and cath lab notified. Hematoma noted from earlier appearing more swollen, and pain at site per patient. Cath lab RN's at bedside. TR band reapplied with 4 ml's of air. Ace bandage also applied. Dr. Irish Lack aware. Will keep ace bandage and TR band on throughout the night and continue to monitor.  Dennison Mascot

## 2015-04-27 NOTE — H&P (View-Only) (Signed)
Cardiology Office Note   Date:  04/25/2015   ID:  Amy Morton, DOB 26-Aug-1934, MRN [id number]  PCP:  Rubbie Battiest, NP  Cardiologist:   Thayer Headings, MD   Chief Complaint  Patient presents with  . Follow-up    6 month f/u c/o sob. Meds reviewed verbally with pt.   1. CAD - s/p PTCA - 1996 2. Hyperlipidemia 3. Anxiety/depression 4.  History of Present Illness:  Amy Morton is a 80 yo with hx of CAD - s/p PCI in 1996 Amy Morton)  She has been seeing Dr. Rosalita Levan. Wanted to changed cardiologist.  She was recently prescribed Abilify and had a bad reaction. She stated that it made her feel quite bad ( chest burning) She has stopped the medication and now feels quite a bit better. She was seen in the ER and had marked HTN. BP is now better.   She walks daily.  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 28, 2016 Amy Morton is a 80 y.o. female who presents for CAD and hyperlipidemia. She has been having exertional CP for 3-4 weeks. Tightness, pressure, mid sternal , radiates out across her chest.  No radiation down arm . Associated with increased dyspnea. Does not have any NTG Has been limiting her exertional activites.  Has trouble getting her mail . Feels very similar to her previous episodes of angina prior to PCI.    Past Medical History  Diagnosis Date  . Anxiety   . Arthritis   . Depression   . GERD (gastroesophageal reflux disease)   . Hyperlipidemia   . Hypertension   . Gastric ulcer   . CAD (coronary artery disease)     s/p angioplasty 1996  . History of colonic polyps     Dr. Vira Agar  . Vitamin D deficiency   . Abnormal mammogram 2007    Recommend close follow up  . Shingles 2008    T-9 distribution  . Elevated LFTs 2010    s/p ultrasound w/ possible fatty liver; GI consult. Resolution 2011  . Aortic stenosis     mild to moderate  . Anemia     Iron, b12, SPEP, UPEP normal, 12/2012  . Valvular heart  disease     Mild to Moderate MR, AS  . Asthma   . aortic stenosis   . Peritoneal carcinomatosis 2001    Dr. Oliva Bustard & Dr. Claiborne Rigg s/p chemo    Past Surgical History  Procedure Laterality Date  . Tonsillectomy  1950  . Cholecystectomy  1987  . Colectomy  2001  . Laparoscopic bilateral salpingo oopherectomy  1988    Dr. Laverta Baltimore  . Appendectomy    . Abdominal hysterectomy  2001  . Coronary angioplasty  1996  . Cardiac catheterization  2007    50% mid LAD stenosis, 70% proximal RCA with 100% distal RCA stenosis with collaterals, EF 65%     Current Outpatient Prescriptions  Medication Sig Dispense Refill  . acetaminophen (TYLENOL) 650 MG CR tablet Take 650 mg by mouth as needed for pain.     Marland Kitchen albuterol (PROVENTIL HFA;VENTOLIN HFA) 108 (90 BASE) MCG/ACT inhaler Inhale into the lungs as needed for wheezing or shortness of breath.    Marland Kitchen aspirin 81 MG tablet Take 81 mg by mouth daily.    . busPIRone (BUSPAR) 5 MG tablet Take 1 tablet (5 mg total) by mouth 2 (two) times daily. 180 tablet 3  .  calcium gluconate 650 MG tablet Take 650 mg by mouth 2 (two) times daily.    . Cholecalciferol (CVS VIT D 5000 HIGH-POTENCY) 5000 UNITS capsule One capsule every other day (Patient taking differently: Take 5,000 Units by mouth every other day. One capsule every other day)    . citalopram (CELEXA) 20 MG tablet Take 1 tablet daily 90 tablet 3  . Fluticasone-Salmeterol (ADVAIR) 250-50 MCG/DOSE AEPB Inhale 1 puff into the lungs as needed.    . isosorbide dinitrate (ISORDIL) 30 MG tablet Take 1 tablet (30 mg total) by mouth daily. 90 tablet 3  . lovastatin (MEVACOR) 20 MG tablet Take 1 tablet (20 mg total) by mouth at bedtime. 90 tablet 3  . metoCLOPramide (REGLAN) 10 MG tablet Take 10 mg by mouth every 6 (six) hours as needed. Pt takes 1/2 tablet  0  . metoprolol (LOPRESSOR) 50 MG tablet Take 1 tablet (50 mg total) by mouth 2 (two) times daily. 180 tablet 3  . mirtazapine (REMERON) 7.5 MG tablet Take 1  tablet (7.5 mg total) by mouth at bedtime. 90 tablet 3  . Multiple Vitamin (MULTIVITAMIN) capsule Take 1 capsule by mouth daily.    Marland Kitchen omega-3 acid ethyl esters (LOVAZA) 1 G capsule Take 2 capsules (2 g total) by mouth 2 (two) times daily. 360 capsule 3  . omeprazole (PRILOSEC) 40 MG capsule Take 1 capsule (40 mg total) by mouth daily. 30 capsule 3  . potassium chloride SA (K-DUR,KLOR-CON) 20 MEQ tablet Take 1 tablet (20 mEq total) by mouth daily. 90 tablet 1  . Probiotic Product (FLORAJEN BIFIDOBLEND) CAPS Take 1 capsule by mouth daily. 90 capsule 3  . sucralfate (CARAFATE) 1 GM/10ML suspension Take 10 mLs (1 g total) by mouth 4 (four) times daily -  with meals and at bedtime. 420 mL 0  . vitamin B-12 (CYANOCOBALAMIN) 250 MCG tablet Take 250 mcg by mouth daily.     No current facility-administered medications for this visit.    Allergies:   Abilify    Social History:  The patient  reports that she has never smoked. She has never used smokeless tobacco. She reports that she does not drink alcohol or use illicit drugs.   Family History:  The patient's family history includes Cancer in her father; Cancer (age of onset: 63) in her daughter; Diabetes in her brother and sister; Heart disease in her brother; Hypertension in her mother; Stroke (age of onset: 59) in her mother. There is no history of Colon cancer or Colon polyps.    ROS:  Please see the history of present illness.    Review of Systems: Constitutional:  denies fever, chills, diaphoresis, appetite change and fatigue.  HEENT: denies photophobia, eye pain, redness, hearing loss, ear pain, congestion, sore throat, rhinorrhea, sneezing, neck pain, neck stiffness and tinnitus.  Respiratory: denies SOB, DOE, cough, chest tightness, and wheezing.  Cardiovascular: denies chest pain, palpitations and leg swelling.  Gastrointestinal: denies nausea, vomiting, abdominal pain, diarrhea, constipation, blood in stool.  Genitourinary: denies  dysuria, urgency, frequency, hematuria, flank pain and difficulty urinating.  Musculoskeletal: denies  myalgias, back pain, joint swelling, arthralgias and gait problem.   Skin: denies pallor, rash and wound.  Neurological: denies dizziness, seizures, syncope, weakness, light-headedness, numbness and headaches.   Hematological: denies adenopathy, easy bruising, personal or family bleeding history.  Psychiatric/ Behavioral: denies suicidal ideation, mood changes, confusion, nervousness, sleep disturbance and agitation.       All other systems are reviewed and negative.  PHYSICAL EXAM: VS:  BP 108/60 mmHg  Pulse 71  Ht 4\' 11"  (1.499 m)  Wt 64.638 kg (142 lb 8 oz)  BMI 28.77 kg/m2 , BMI Body mass index is 28.77 kg/(m^2). GEN: Well nourished, well developed, in no acute distress HEENT: normal Neck: no JVD, carotid bruits, or masses Cardiac: RRR; no murmurs, rubs, or gallops,no edema  Respiratory:  clear to auscultation bilaterally, normal work of breathing GI: soft, nontender, nondistended, + BS MS: no deformity or atrophy Skin: warm and dry, no rash Neuro:  Strength and sensation are intact Psych: normal   EKG:  EKG is ordered today. The ekg ordered today demonstrates normal sinus rhythm at 65. Poor R wave progression which is likely due to lead placement versus pulmonary disease.   Recent Labs: 05/10/2014: Magnesium 1.5; TSH 1.71 11/17/2014: BUN 15; Creatinine 0.71; Potassium 3.5; SGPT (ALT) 27; Sodium 135 01/10/2015: Hemoglobin 12.5; Platelets 200.0    Lipid Panel    Component Value Date/Time   CHOL 148 10/19/2014 1212   CHOL 144 05/10/2014 0851   TRIG 193* 10/19/2014 1212   HDL 48 10/19/2014 1212   HDL 40.20 05/10/2014 0851   CHOLHDL 3.1 10/19/2014 1212   CHOLHDL 4 05/10/2014 0851   VLDL 15.8 05/10/2014 0851   LDLCALC 61 10/19/2014 1212   LDLCALC 88 05/10/2014 0851      Wt Readings from Last 3 Encounters:  04/25/15 64.638 kg (142 lb 8 oz)  01/10/15 61.145 kg  (134 lb 12.8 oz)  11/20/14 62.596 kg (138 lb)      Other studies Reviewed: Additional studies/ records that were reviewed today include:  Review of the above records demonstrates:    ASSESSMENT AND PLAN:  1. Unstable angina  - s/p PTCA - 1996 -  [place] symptoms that are consistent with her previous episodes of angina. She now has chest pressure while walking to the mailbox. She has to stop once or twice walking to her mailbox. She is on isosorbide and metoprolol. She does not have regular nitroglycerin.  She she has an occluded distal right coronary artery and also has moderate disease in her LAD and circumflex systems. I do not think that a Myoview study would be of much use. It certainly would be abnormal. Given her symptoms and the fact that her Myoview study will be abnormal, I think our best option is to proceed with cardiac catheterization. We have discussed the risks, benefits, and options concerning cardiac catheterization. She understands and agrees to proceed. We've scheduled at 10:30 on Friday morning with Dr. Irish Lack.  We will get pre-cath labs today . She will follow up with me in 1-2 weeks.   2. Hyperlipidemia - lipds have been well controlled.  Continue current meds.     3. Anxiety/depression followed by her general medical doctor.   Current medicines are reviewed at length with the patient today.  The patient does not have concerns regarding medicines.  The following changes have been made:  no change  Labs/ tests ordered today include:  Orders Placed This Encounter  Procedures  . EKG 12-Lead     Disposition:   FU with me in 1-2 weeks   Signed, Shereese Bonnie, Wonda Cheng, MD  04/25/2015 11:34 AM    [facility] [place], [place], [place]  [address] Phone: [phone]; Fax: [phone]

## 2015-04-27 NOTE — Progress Notes (Signed)
Growth noted proximal to TR band. Pressure applied x 10 min. Resolved for now. Will observe.

## 2015-04-28 DIAGNOSIS — I350 Nonrheumatic aortic (valve) stenosis: Secondary | ICD-10-CM

## 2015-04-28 DIAGNOSIS — I2510 Atherosclerotic heart disease of native coronary artery without angina pectoris: Secondary | ICD-10-CM

## 2015-04-28 DIAGNOSIS — E785 Hyperlipidemia, unspecified: Secondary | ICD-10-CM

## 2015-04-28 LAB — CBC
HCT: 29.9 % — ABNORMAL LOW (ref 36.0–46.0)
Hemoglobin: 9.8 g/dL — ABNORMAL LOW (ref 12.0–15.0)
MCH: 29.2 pg (ref 26.0–34.0)
MCHC: 32.8 g/dL (ref 30.0–36.0)
MCV: 89 fL (ref 78.0–100.0)
Platelets: 142 10*3/uL — ABNORMAL LOW (ref 150–400)
RBC: 3.36 MIL/uL — ABNORMAL LOW (ref 3.87–5.11)
RDW: 15.1 % (ref 11.5–15.5)
WBC: 7.3 10*3/uL (ref 4.0–10.5)

## 2015-04-28 LAB — BASIC METABOLIC PANEL
Anion gap: 9 (ref 5–15)
BUN: 13 mg/dL (ref 6–20)
CO2: 25 mmol/L (ref 22–32)
Calcium: 8.5 mg/dL — ABNORMAL LOW (ref 8.9–10.3)
Chloride: 101 mmol/L (ref 101–111)
Creatinine, Ser: 0.82 mg/dL (ref 0.44–1.00)
GFR calc Af Amer: >60 mL/min (ref >60)
GFR calc non Af Amer: >60 mL/min (ref >60)
Glucose, Bld: 111 mg/dL — ABNORMAL HIGH (ref 65–99)
Potassium: 3.6 mmol/L (ref 3.5–5.1)
Sodium: 135 mmol/L (ref 135–145)

## 2015-04-28 MED ORDER — ATORVASTATIN CALCIUM 80 MG PO TABS
80.0000 mg | ORAL_TABLET | Freq: Every day | ORAL | Status: DC
  Administered 2015-04-28: 80 mg via ORAL
  Filled 2015-04-28: qty 1

## 2015-04-28 NOTE — Progress Notes (Signed)
SUBJECTIVE:  No complaints  OBJECTIVE:   Vitals:   Filed Vitals:   04/28/15 0900 04/28/15 1000 04/28/15 1100 04/28/15 1106  BP: 146/44 139/47 147/53 147/53  Pulse: 73 80 84 70  Temp:    97.6 F (36.4 C)  TempSrc:    Oral  Resp: 13 12 15 14   Height:      Weight:      SpO2: 96% 95% 95% 97%   I&O's:   Intake/Output Summary (Last 24 hours) at 04/28/15 1230 Last data filed at 04/28/15 1100  Gross per 24 hour  Intake 835.32 ml  Output   2000 ml  Net -1164.68 ml   TELEMETRY: Reviewed telemetry pt in NSR:     PHYSICAL EXAM General: Well developed, well nourished, in no acute distress Head: Eyes PERRLA, No xanthomas.   Normal cephalic and atramatic  Lungs:   Clear bilaterally to auscultation and percussion. Heart:   HRRR S1 S2 Pulses are 2+ & equal. 2/6 SM at RUSB to LLSB Abdomen: Bowel sounds are positive, abdomen soft and non-tender without masses  Extremities:   No clubbing, cyanosis or edema.  DP +1 Neuro: Alert and oriented X 3. Psych:  Good affect, responds appropriately   LABS: Basic Metabolic Panel:  Recent Labs  04/27/15 0845 04/27/15 0949 04/28/15 0308  NA 137  --  135  K 6.9* 4.1 3.6  CL 105  --  101  CO2 25  --  25  GLUCOSE 106*  --  111*  BUN 12  --  13  CREATININE 0.85  --  0.82  CALCIUM 9.0  --  8.5*   Liver Function Tests: No results for input(s): AST, ALT, ALKPHOS, BILITOT, PROT, ALBUMIN in the last 72 hours. No results for input(s): LIPASE, AMYLASE in the last 72 hours. CBC:  Recent Labs  04/27/15 0845 04/27/15 1822 04/28/15 0308  WBC 5.5  --  7.3  HGB 10.3*  --  9.8*  HCT 32.3*  --  29.9*  MCV 89.7  --  89.0  PLT 139* 157 142*   Cardiac Enzymes: No results for input(s): CKTOTAL, CKMB, CKMBINDEX, TROPONINI in the last 72 hours. BNP: Invalid input(s): POCBNP D-Dimer: No results for input(s): DDIMER in the last 72 hours. Hemoglobin A1C: No results for input(s): HGBA1C in the last 72 hours. Fasting Lipid Panel: No results for  input(s): CHOL, HDL, LDLCALC, TRIG, CHOLHDL, LDLDIRECT in the last 72 hours. Thyroid Function Tests: No results for input(s): TSH, T4TOTAL, T3FREE, THYROIDAB in the last 72 hours.  Invalid input(s): FREET3 Anemia Panel: No results for input(s): VITAMINB12, FOLATE, FERRITIN, TIBC, IRON, RETICCTPCT in the last 72 hours. Coag Panel:   Lab Results  Component Value Date   INR 1.21 04/27/2015   INR 1.1 04/25/2015    RADIOLOGY: No results found.    ASSESSMENT/PLAN: 1.  ASCAD - Dist RCA lesion, 100% stenosed. Known from 2012 catheterization. Short occlusion but heavily calcified. There is a stump if intervention was to be attempted. Focal Mid LAD-2 lesion, 70% stenosed just after a diagonal at a tortuos segment. The lesion was not previously treated. Mid Cx lesion, 70% stenosed with 0% residual stenosis post intervention with overlapping DES.Complex multivessel disease. Moderate to severe disease in the mid circumflex that was significant by FFR. This was successfully treated with overlapping drug-eluting stents. Continue ASA/Plavix/long acting nitrates/BB We'll see how she does with one-vessel treated. If she continues to have angina, could bring her back for LAD intervention. This would be complicated by  the tortuosity in that LAD segment. 2.  Dyslipidemia - Was on Mevacor as outpt - change to Lipitor 80mg  daily. Will need FLP and ALT in 6 weeks 3.  Anxiety/depression - per PCP 4.  Mild to moderate AS by echo 2014 - prominent heart monitor on exam.  Will repeat echo today.   Patient stable for transfer to tele bed.  Will ambulate in hall.  If no further angina then d/c home in am.   Sueanne Margarita, MD  04/28/2015  12:30 PM

## 2015-04-28 NOTE — Progress Notes (Signed)
CARDIAC REHAB PHASE I   Patient on bedrest related to TR band still on. Possible D/C tomorrow per MD notes. Discharge education complete and handouts given including importance of compliance with antiplatelet therapy. Teachback noted. Patient expresses interest in attending phase 2 cardiac rehab at [facility] Order placed. Referral letter will be sent. Daughter at bedside. Stent cards given.  Santina Evans, BSN 04/28/2015 3:33 PM

## 2015-04-29 DIAGNOSIS — Z9861 Coronary angioplasty status: Secondary | ICD-10-CM

## 2015-04-29 MED ORDER — ISOSORBIDE MONONITRATE ER 30 MG PO TB24: 30.0000 mg | ORAL_TABLET | Freq: Every day | ORAL | Status: DC

## 2015-04-29 MED ORDER — CLOPIDOGREL BISULFATE 75 MG PO TABS: 75.0000 mg | ORAL_TABLET | Freq: Every day | ORAL | Status: DC

## 2015-04-29 MED ORDER — ATORVASTATIN CALCIUM 80 MG PO TABS: 80.0000 mg | ORAL_TABLET | Freq: Every day | ORAL | Status: DC

## 2015-04-29 NOTE — Progress Notes (Signed)
Discharged to home with family office visits in place teaching done  

## 2015-04-29 NOTE — Progress Notes (Signed)
SUBJECTIVE:  No complaints today, feeling well.  OBJECTIVE:   Vitals:   Filed Vitals:   04/28/15 1700 04/28/15 1950 04/29/15 0526 04/29/15 0913  BP: 133/60 129/58 124/58   Pulse: 70 72 65   Temp: 98.4 F (36.9 C) 98 F (36.7 C) 98 F (36.7 C)   TempSrc: Oral Oral Oral   Resp: 16 18 18    Height:      Weight:      SpO2: 95% 95% 96% 97%   I&O's:    Intake/Output Summary (Last 24 hours) at 04/29/15 0920 Last data filed at 04/28/15 2101  Gross per 24 hour  Intake    183 ml  Output    500 ml  Net   -317 ml   TELEMETRY: Reviewed telemetry pt in NSR:     PHYSICAL EXAM General: Well developed, well nourished, in no acute distress Head: Eyes PERRLA, No xanthomas.   Normal cephalic and atramatic  Lungs:   Clear bilaterally to auscultation and percussion. Heart:   HRRR S1 S2 Pulses are 2+ & equal. 2/6 SM at RUSB to LLSB Abdomen: Bowel sounds are positive, abdomen soft and non-tender without masses  Extremities:   No clubbing, cyanosis or edema.  DP +1 Neuro: Alert and oriented X 3. Psych:  Good affect, responds appropriately   LABS: Basic Metabolic Panel:  Recent Labs  04/27/15 0845 04/27/15 0949 04/28/15 0308  NA 137  --  135  K 6.9* 4.1 3.6  CL 105  --  101  CO2 25  --  25  GLUCOSE 106*  --  111*  BUN 12  --  13  CREATININE 0.85  --  0.82  CALCIUM 9.0  --  8.5*   Liver Function Tests: No results for input(s): AST, ALT, ALKPHOS, BILITOT, PROT, ALBUMIN in the last 72 hours. No results for input(s): LIPASE, AMYLASE in the last 72 hours. CBC:  Recent Labs  04/27/15 0845 04/27/15 1822 04/28/15 0308  WBC 5.5  --  7.3  HGB 10.3*  --  9.8*  HCT 32.3*  --  29.9*  MCV 89.7  --  89.0  PLT 139* 157 142*   Cardiac Enzymes: No results for input(s): CKTOTAL, CKMB, CKMBINDEX, TROPONINI in the last 72 hours. BNP: Invalid input(s): POCBNP D-Dimer: No results for input(s): DDIMER in the last 72 hours. Hemoglobin A1C: No results for input(s): HGBA1C in the  last 72 hours. Fasting Lipid Panel: No results for input(s): CHOL, HDL, LDLCALC, TRIG, CHOLHDL, LDLDIRECT in the last 72 hours. Thyroid Function Tests: No results for input(s): TSH, T4TOTAL, T3FREE, THYROIDAB in the last 72 hours.  Invalid input(s): FREET3 Anemia Panel: No results for input(s): VITAMINB12, FOLATE, FERRITIN, TIBC, IRON, RETICCTPCT in the last 72 hours. Coag Panel:   Lab Results  Component Value Date   INR 1.21 04/27/2015   INR 1.1 04/25/2015    RADIOLOGY: No results found.    ASSESSMENT/PLAN: 1.  ASCAD - Dist RCA lesion, 100% stenosed. Known from 2012 catheterization. Short occlusion but heavily calcified. There is a stump if intervention was to be attempted. Focal Mid LAD-2 lesion, 70% stenosed just after a diagonal at a tortuos segment. The lesion was not previously treated. Mid Cx lesion, 70% stenosed with 0% residual stenosis post intervention with overlapping DES.Complex multivessel disease. Moderate to severe disease in the mid circumflex that was significant by FFR. This was successfully treated with overlapping drug-eluting stents. Continue ASA/Plavix/long acting nitrates/BB We'll see how she does with one-vessel treated. If she  continues to have angina, could bring her back for LAD intervention. This would be complicated by the tortuosity in that LAD segment. 2.  Dyslipidemia - Was on Mevacor as outpt - change to Lipitor 80mg  daily. Nasirah Sachs need FLP and ALT in 6 weeks 3.  Anxiety/depression - per PCP 4.  Mild to moderate AS by echo 2014 - prominent heart monitor on exam.    Patient feeling well and stable for discharge.  No further changes needed.  Discussed the importance of DAPT.  plan for discharge on plavix.    Tymier Lindholm Meredith Leeds, MD  04/29/2015  9:20 AM

## 2015-04-29 NOTE — Progress Notes (Signed)
Utilization Review Completed.Amy Morton T10/08/2014  

## 2015-04-29 NOTE — Discharge Summary (Signed)
Physician Discharge Summary   Cardiologist:  Nahser  Patient ID: KAO BERKHEIMER MRN: [id number] DOB/AGE: 78-Jan-1936 78 y.o.  Admit date: 04/27/2015 Discharge date: 04/29/2015  Admission Diagnoses: Chest pain  Discharge Diagnoses:  Active Problems:   Dyslipidemia, goal LDL below 70   Aortic valve stenosis, acquired   CAD S/P percutaneous coronary angioplasty   CAD (coronary artery disease)   chest pain  Discharged Condition: stable  Hospital Course:   Patient is a 80 year old female with a history of coronary artery disease, aortic stenosis, essential hypertension and hyperlipidemia.  She's been having exertional chest pain for 3-4 weeks. She described as tightness pressure midsternal and radiating across her chest. There was no radiation down her arm. It was associated with increased dyspnea. She did not have any nitroglycerin. She been limiting her exertional activities because of this and has had trouble getting her mail. She feels very similar to previous episodes of angina prior to PCI.  She was scheduled for left heart catheterization which revealed Dist RCA lesion, 100% stenosed. Known from 2012 catheterization. Short occlusion but heavily calcified. There is a stump if intervention was to be attempted. Focal Mid LAD-2 lesion, 70% stenosed just after a diagonal at a tortuos segment. The lesion was not previously treated. Mid Cx lesion, 70% stenosed with 0% residual stenosis post intervention with overlapping DES. Continue ASA/Plavix/long acting nitrates/BB.   We'll see how she does with one-vessel treated. If she continues to have angina, could bring her back for LAD intervention. This would be complicated by the tortuosity in that LAD segment.  Lovastatin was changed to Lipitor 80 mg.  The patient was seen by Dr. Curt Bears who felt she was stable for DC home.  Needs lipid panel and LFTs in 6 weeks   Consults: cardiac rehabilitation  Significant Diagnostic Studies:  Left  heart catheterization Conclusion     Dist RCA lesion, 100% stenosed. Known from 2012 catheterization. Short occlusion but heavily calcified. There is a stump if intervention was to be attempted.  Focal Mid LAD-2 lesion, 70% stenosed just after a diagonal at a tortuos segment. The lesion was not previously treated.  Mid Cx lesion, 70% stenosed. There is a 0% residual stenosis post intervention with overlapping DES , Synergy 3.0 x 12 and Synergy 2.5 x 8, all postdilated with a 3.25 balloon. The lesion was not previously treated.  Complex multivessel disease. Moderate to severe disease in the mid circumflex that was significant by FFR. This was successfully treated with overlapping drug-eluting stents. Due to its tortuosity, it was difficult to deliver a long stents.  We'll see how she does with one-vessel treated. If she continues to have angina, could bring her back for LAD intervention. This would be complicated by the tortuosity in that LAD segment.     Diagnostic Diagram           Post-Intervention Diagram          Treatments: see above  Discharge Exam: Blood pressure 124/58, pulse 65, temperature 98 F (36.7 C), temperature source Oral, resp. rate 18, height 4\' 11"  (1.499 m), weight 142 lb 13.7 oz (64.8 kg), SpO2 97 %.   Disposition: Final discharge disposition not confirmed      Discharge Instructions    Amb Referral to Cardiac Rehabilitation    Complete by:  As directed   Congestive Heart Failure: If diagnosis is Heart Failure, patient MUST meet each of the CMS criteria: 1. Left Ventricular Ejection Fraction </= 35% 2. NYHA class II-IV symptoms  despite being on optimal heart failure therapy for at least 6 weeks. 3. Stable = have not had a recent (<6 weeks) or planned (<6 months) major cardiovascular hospitalization or procedure  Program Details: - Physician supervised classes - 1-3 classes per week over a 12-18 week period, generally for a total of 36  sessions  Physician Certification: I certify that the above Cardiac Rehabilitation treatment is medically necessary and is medically approved by me for treatment of this patient. The patient is willing and cooperative, able to ambulate and medically stable to participate in exercise rehabilitation. The participant's progress and Individualized Treatment Plan Mavrik Bynum be reviewed by the Medical Director, Cardiac Rehab staff and as indicated by the Referring/Ordering Physician.  Diagnosis:  PCI     Diet - low sodium heart healthy    Complete by:  As directed      Discharge instructions    Complete by:  As directed   No lifting with right arm for 3 days     Increase activity slowly    Complete by:  As directed             Medication List    STOP taking these medications        lovastatin 20 MG tablet  Commonly known as:  MEVACOR      TAKE these medications        acetaminophen 650 MG CR tablet  Commonly known as:  TYLENOL  Take 650 mg by mouth as needed for pain.     albuterol 108 (90 BASE) MCG/ACT inhaler  Commonly known as:  PROVENTIL HFA;VENTOLIN HFA  Inhale 2 puffs into the lungs every 4 (four) hours as needed for wheezing or shortness of breath.     aspirin 81 MG tablet  Take 81 mg by mouth daily.     atorvastatin 80 MG tablet  Commonly known as:  LIPITOR  Take 1 tablet (80 mg total) by mouth daily at 6 PM.     busPIRone 5 MG tablet  Commonly known as:  BUSPAR  Take 1 tablet (5 mg total) by mouth 2 (two) times daily.     calcium gluconate 650 MG tablet  Take 650 mg by mouth 2 (two) times daily.     Cholecalciferol 5000 UNITS capsule  Commonly known as:  [employer] VIT D 5000 HIGH-POTENCY  One capsule every other day     citalopram 20 MG tablet  Commonly known as:  CELEXA  Take 1 tablet daily     clopidogrel 75 MG tablet  Commonly known as:  PLAVIX  Take 1 tablet (75 mg total) by mouth daily with breakfast.     FLORAJEN BIFIDOBLEND Caps  Take 1 capsule by mouth  daily.     Fluticasone-Salmeterol 250-50 MCG/DOSE Aepb  Commonly known as:  ADVAIR  Inhale 1 puff into the lungs as needed (for shortness of breath).     isosorbide dinitrate 30 MG tablet  Commonly known as:  ISORDIL  Take 1 tablet (30 mg total) by mouth daily.     metoprolol 50 MG tablet  Commonly known as:  LOPRESSOR  Take 1 tablet (50 mg total) by mouth 2 (two) times daily.     mirtazapine 7.5 MG tablet  Commonly known as:  REMERON  Take 1 tablet (7.5 mg total) by mouth at bedtime.     multivitamin capsule  Take 1 capsule by mouth daily.     nitroGLYCERIN 0.4 MG SL tablet  Commonly known as:  NITROSTAT  Place 1 tablet (0.4 mg total) under the tongue every 5 (five) minutes as needed for chest pain.     omega-3 acid ethyl esters 1 G capsule  Commonly known as:  LOVAZA  Take 2 capsules (2 g total) by mouth 2 (two) times daily.     omeprazole 40 MG capsule  Commonly known as:  PRILOSEC  Take 1 capsule (40 mg total) by mouth daily.     potassium chloride SA 20 MEQ tablet  Commonly known as:  K-DUR,KLOR-CON  Take 1 tablet (20 mEq total) by mouth daily.     sucralfate 1 GM/10ML suspension  Commonly known as:  CARAFATE  Take 10 mLs (1 g total) by mouth 4 (four) times daily -  with meals and at bedtime.     vitamin B-12 250 MCG tablet  Commonly known as:  CYANOCOBALAMIN  Take 250 mcg by mouth daily.       Follow-up Information    Follow up with Nahser, Wonda Cheng, MD.   Specialty:  Cardiology   Why:  The office Galile[facility] Piggee call you with your follow-up appointment date and time.   Contact information:   [address] [place] [address] [phone]      Greater than 30 minutes was spent completing the patient's discharge.    SignedMarcello Moores 04/29/2015, 10:14 AM  I have seen and examined this patient with Tarri Fuller.  Agree with above, note added to reflect my findings.  On exam, regular rhythm, no wheezing.  Had MI with stenting to Cx lesion  as above.  No further chest pain.  Discharge on Asprin and Plavix with follow up in cardiology clinic to be arranged.    Markie Frith M. Calisa Luckenbaugh MD 04/29/2015 11:18 AM

## 2015-04-30 MED FILL — Heparin Sodium (Porcine) 2 Unit/ML in Sodium Chloride 0.9%: INTRAMUSCULAR | Qty: 1000 | Status: AC

## 2015-05-07 ENCOUNTER — Ambulatory Visit (INDEPENDENT_AMBULATORY_CARE_PROVIDER_SITE_OTHER): Payer: Medicare Other | Admitting: Cardiovascular Disease

## 2015-05-07 ENCOUNTER — Encounter: Payer: Self-pay | Admitting: Cardiovascular Disease

## 2015-05-07 VITALS — BP 122/62 | HR 65 | Ht 59.0 in | Wt 137.5 lb

## 2015-05-07 DIAGNOSIS — I2510 Atherosclerotic heart disease of native coronary artery without angina pectoris: Secondary | ICD-10-CM

## 2015-05-07 DIAGNOSIS — I200 Unstable angina: Secondary | ICD-10-CM | POA: Diagnosis not present

## 2015-05-07 DIAGNOSIS — Z9861 Coronary angioplasty status: Secondary | ICD-10-CM

## 2015-05-07 DIAGNOSIS — Z23 Encounter for immunization: Secondary | ICD-10-CM | POA: Diagnosis not present

## 2015-05-07 NOTE — Patient Instructions (Addendum)
Medication Instructions:  Your physician recommends that you continue on your current medications as directed. Please refer to the Current Medication list given to you today.   Labwork: Lipid, liver, bmet in 3 months  Testing/Procedures: none  Follow-Up: Your physician wants you to follow-up in: six months with Dr. Vilinda Boehringer will receive a reminder letter in the mail two months in advance. If you don't receive a letter, please call our office to schedule the follow-up appointment.   Any Other Special Instructions Will Be Listed Below (If Applicable).

## 2015-05-07 NOTE — Progress Notes (Signed)
Cardiology Office Note   Date:  05/07/2015   ID:  Amy Morton, Amy Morton, Amy Morton, MRN [id number]  PCP:  Rubbie Battiest, NP  Cardiologist:   Thayer Headings, MD   Chief Complaint  Patient presents with  . other    3 month follow up. Meds reviewed by the patient verbally. "doing well." Pt. c/o bruising with soreness and pain on her right arm; s/p cardiac cath/ stent placement.     1. CAD - s/p PTCA - 1996. S/p PCI of her LCX Sept. 2016 2. Hyperlipidemia 3. Anxiety/depression 4.  History of Present Illness:  Amy Morton is a 80 yo with hx of CAD - s/p PCI in 1996 [name])  She has been seeing Dr. Rosalita Levan. Wanted to changed cardiologist.  She was recently prescribed Abilify and had a bad reaction. She stated that it made her feel quite bad ( chest burning) She has stopped the medication and now feels quite a bit better. She was seen in the ER and had marked HTN. BP is now better.   She walks daily.  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 28, 2016 Amy Morton is a 80 y.o. female who presents for CAD and hyperlipidemia. She has been having exertional CP for 3-4 weeks. Tightness, pressure, mid sternal , radiates out across her chest.  No radiation down arm . Associated with increased dyspnea. Does not have any NTG Has been limiting her exertional activites.  Has trouble getting her mail . Feels very similar to her previous episodes of angina prior to PCI.   Oct. 10, 2016:  Amy Morton is seen back today for her coronary artery disease. I saw her on September 28 with symptoms of unstable angina. She had a cardiac catheterization by Dr. Irish Lack and was found to have chronic total occlusion of her right coronary artery. The mid LAD had a 70% stenosis. The left circumflex artery had a tight stenosis and had stents placed. The mid LAD stenosis is difficult to approach because of a very sharp bend. She is basically pain free. Has had  some sharp pains that woke her from sleep , took a SL NTG and the pain resolved.  Getting regular exercise - walking regualry    Past Medical History  Diagnosis Date  . Anxiety   . Arthritis   . Depression   . GERD (gastroesophageal reflux disease)   . Hyperlipidemia   . Hypertension   . Gastric ulcer   . CAD (coronary artery disease)     s/p angioplasty 1996  . History of colonic polyps     Dr. Vira Agar  . Vitamin D deficiency   . Abnormal mammogram 2007    Recommend close follow up  . Shingles 2008    T-9 distribution  . Elevated LFTs 2010    s/p ultrasound w/ possible fatty liver; GI consult. Resolution 2011  . Aortic stenosis     mild to moderate  . Anemia     Iron, b12, SPEP, UPEP normal, 12/2012  . Valvular heart disease     Mild to Moderate MR, AS  . Asthma   . aortic stenosis   . Peritoneal carcinomatosis [facility]) 2001    Dr. Oliva Bustard & Dr. [facility] Rigg s/p chemo    Past Surgical History  Procedure Laterality Date  . Tonsillectomy  1950  . Cholecystectomy  1987  . Colectomy  2001  . Laparoscopic bilateral salpingo oopherectomy  1988  Dr. Laverta Baltimore  . Appendectomy    . Abdominal hysterectomy  2001  . Cardiac catheterization  2007    50% mid LAD stenosis, 70% proximal RCA with 100% distal RCA stenosis with collaterals, EF 65%  . Cardiac catheterization N/A 04/27/2015    Procedure: Left Heart Cath and Coronary Angiography;  Surgeon: Jettie Booze, MD;  Location: [place] [facility];  Service: Cardiovascular;  Laterality: N/A;  . Cardiac catheterization N/A 04/27/2015    Procedure: Coronary Stent Intervention;  Surgeon: Jettie Booze, MD;  Location: [place] [facility];  Service: Cardiovascular;  Laterality: N/A;  . Coronary angioplasty  1996     Current Outpatient Prescriptions  Medication Sig Dispense Refill  . acetaminophen (TYLENOL) 650 MG CR tablet Take 650 mg by mouth as needed for pain.     Marland Kitchen albuterol (PROVENTIL HFA;VENTOLIN HFA) 108 (90 BASE)  MCG/ACT inhaler Inhale 2 puffs into the lungs every 4 (four) hours as needed for wheezing or shortness of breath.     Marland Kitchen aspirin 81 MG tablet Take 81 mg by mouth daily.    Marland Kitchen atorvastatin (LIPITOR) 80 MG tablet Take 1 tablet (80 mg total) by mouth daily at 6 PM. 30 tablet 11  . busPIRone (BUSPAR) 5 MG tablet Take 1 tablet (5 mg total) by mouth 2 (two) times daily. 180 tablet 3  . calcium gluconate 650 MG tablet Take 650 mg by mouth 2 (two) times daily.    . Cholecalciferol ([employer]S VIT D 5000 HIGH-POTENCY) 5000 UNITS capsule One capsule every other day (Patient taking differently: Take 5,000 Units by mouth every other day. One capsule every other day)    . citalopram (CELEXA) 20 MG tablet Take 1 tablet daily (Patient taking differently: Take 20 mg by mouth daily. ) 90 tablet 3  . clopidogrel (PLAVIX) 75 MG tablet Take 1 tablet (75 mg total) by mouth daily with breakfast. 30 tablet 11  . Fluticasone-Salmeterol (ADVAIR) 250-50 MCG/DOSE AEPB Inhale 1 puff into the lungs as needed (for shortness of breath).     . isosorbide mononitrate (IMDUR) 30 MG 24 hr tablet Take 1 tablet (30 mg total) by mouth daily. 30 tablet 6  . metoprolol (LOPRESSOR) 50 MG tablet Take 1 tablet (50 mg total) by mouth 2 (two) times daily. 180 tablet 3  . mirtazapine (REMERON) 7.5 MG tablet Take 1 tablet (7.5 mg total) by mouth at bedtime. 90 tablet 3  . Multiple Vitamin (MULTIVITAMIN) capsule Take 1 capsule by mouth daily.    . nitroGLYCERIN (NITROSTAT) 0.4 MG SL tablet Place 1 tablet (0.4 mg total) under the tongue every 5 (five) minutes as needed for chest pain. 25 tablet 3  . omega-3 acid ethyl esters (LOVAZA) 1 G capsule Take 2 capsules (2 g total) by mouth 2 (two) times daily. 360 capsule 3  . omeprazole (PRILOSEC) 40 MG capsule Take 1 capsule (40 mg total) by mouth daily. 30 capsule 3  . potassium chloride SA (K-DUR,KLOR-CON) 20 MEQ tablet Take 1 tablet (20 mEq total) by mouth daily. 90 tablet 1  . Probiotic Product (FLORAJEN  BIFIDOBLEND) CAPS Take 1 capsule by mouth daily. 90 capsule 3  . sucralfate (CARAFATE) 1 GM/10ML suspension Take 10 mLs (1 g total) by mouth 4 (four) times daily -  with meals and at bedtime. 420 mL 0  . vitamin B-12 (CYANOCOBALAMIN) 250 MCG tablet Take 250 mcg by mouth daily.     No current facility-administered medications for this visit.    Allergies:  Abilify    Social History:  The patient  reports that she has never smoked. She has never used smokeless tobacco. She reports that she does not drink alcohol or use illicit drugs.   Family History:  The patient's family history includes Cancer in her father; Cancer (age of onset: 64) in her daughter; Diabetes in her brother and sister; Heart disease in her brother; Hypertension in her mother; Stroke (age of onset: 54) in her mother. There is no history of Colon cancer or Colon polyps.    ROS:  Please see the history of present illness.    Review of Systems: Constitutional:  denies fever, chills, diaphoresis, appetite change and fatigue.  HEENT: denies photophobia, eye pain, redness, hearing loss, ear pain, congestion, sore throat, rhinorrhea, sneezing, neck pain, neck stiffness and tinnitus.  Respiratory: denies SOB, DOE, cough, chest tightness, and wheezing.  Cardiovascular: denies chest pain, palpitations and leg swelling.  Gastrointestinal: denies nausea, vomiting, abdominal pain, diarrhea, constipation, blood in stool.  Genitourinary: denies dysuria, urgency, frequency, hematuria, flank pain and difficulty urinating.  Musculoskeletal: denies  myalgias, back pain, joint swelling, arthralgias and gait problem.   Skin: denies pallor, rash and wound.  Neurological: denies dizziness, seizures, syncope, weakness, light-headedness, numbness and headaches.   Hematological: denies adenopathy, easy bruising, personal or family bleeding history.  Psychiatric/ Behavioral: denies suicidal ideation, mood changes, confusion, nervousness, sleep  disturbance and agitation.       All other systems are reviewed and negative.    PHYSICAL EXAM: VS:  BP 122/62 mmHg  Pulse 65  Ht 4\' 11"  (1.499 m)  Wt 62.37 kg (137 lb 8 oz)  BMI 27.76 kg/m2 , BMI Body mass index is 27.76 kg/(m^2). GEN: Well nourished, well developed, in no acute distress HEENT: normal Neck: no JVD, carotid bruits, or masses Cardiac: RRR; 1-2 / 6 systolic murmur, rubs, or gallops,no edema  Respiratory:  clear to auscultation bilaterally, normal work of breathing GI: soft, nontender, nondistended, + BS MS:  Large bruise on right forearm Skin: warm and dry, no rash Neuro:  Strength and sensation are intact Psych: normal   EKG:  EKG is ordered today. The ekg ordered today demonstrates normal sinus rhythm at 61. She has no ST or T wave changes. The EKG is normal.   Recent Labs: 05/10/2014: Magnesium 1.5; TSH 1.71 11/17/2014: SGPT (ALT) 27 04/28/2015: BUN 13; Creatinine, Ser 0.82; Hemoglobin 9.8*; Platelets 142*; Potassium 3.6; Sodium 135    Lipid Panel    Component Value Date/Time   CHOL 148 10/19/2014 1212   CHOL 144 05/10/2014 0851   TRIG 193* 10/19/2014 1212   HDL 48 10/19/2014 1212   HDL 40.20 05/10/2014 0851   CHOLHDL 3.1 10/19/2014 1212   CHOLHDL 4 05/10/2014 0851   VLDL 15.8 05/10/2014 0851   LDLCALC 61 10/19/2014 1212   LDLCALC 88 05/10/2014 0851      Wt Readings from Last 3 Encounters:  05/07/15 62.37 kg (137 lb 8 oz)  04/27/15 64.8 kg (142 lb 13.7 oz)  04/25/15 64.638 kg (142 lb 8 oz)      Other studies Reviewed: Additional studies/ records that were reviewed today include:  Review of the above records demonstrates:    ASSESSMENT AND PLAN:  1. Unstable angina  - s/p PTCA - 1996 - unit is doing much better. She status post PCI of her left circumflex artery.  Dist RCA lesion, 100% stenosed. Known from 2012 catheterization. Short occlusion but heavily calcified. There is a stump if intervention was  to be attempted.  Focal Mid  LAD-2 lesion, 70% stenosed just after a diagonal at a tortuos segment. The lesion was not previously treated.  Mid Cx lesion, 70% stenosed. There is a 0% residual stenosis post intervention with overlapping DES , Synergy 3.0 x 12 and Synergy 2.5 x 8, all postdilated with a 3.25 balloon. The lesion was not previously treated.  Continue current medications. If she develops any chest pain then we can consider PCI of the mid LAD. She will send her same medications.  2. Hyperlipidemia -we have stopped her lovastatin. We have started her on atorvastatin. We'll check fasting labs in 3 months.    3. Anxiety/depression followed by her general medical doctor.   Current medicines are reviewed at length with the patient today.  The patient does not have concerns regarding medicines.  The following changes have been made:  no change  Labs/ tests ordered today include:  Orders Placed This Encounter  Procedures  . EKG 12-Lead     Disposition:   FU with me in 6 months     Signed, Jakelyn Squyres, Wonda Cheng, MD  05/07/2015 9:46 AM    [facility] [place], [place], [place]  [address] Phone: [phone]; Fax: [phone]

## 2015-06-25 ENCOUNTER — Encounter: Payer: Self-pay | Admitting: *Deleted

## 2015-06-25 ENCOUNTER — Encounter: Payer: Medicare Other | Attending: Cardiovascular Disease | Admitting: *Deleted

## 2015-06-25 DIAGNOSIS — Z955 Presence of coronary angioplasty implant and graft: Secondary | ICD-10-CM

## 2015-06-25 NOTE — Progress Notes (Signed)
Incomplete Session Note  Patient Details  Name: Amy Morton MRN: [id number] Date of Birth: 08-29-1934 Referring Provider:  Thayer Headings, MD  Amy Morton did not complete her rehab session.  Amy Morton signed her consent for Cardiac Rehab. I asked her in the private conference room if she wanted to pick a goal and she said she only attended the orientation appt since her doctor wanted her there. Amy Morton walked into the Cardiac REhab gym where there were 10 people exercising and said "I don't like this it all. I don't like to be around a lot of people and I won't attend." I gave her a calendar with our phone number and said if she changes her mind she can call us. Amy Morton said she exercises with her equipment at home and doesn't like crows that is why she has equipment at home. 6 minute walk was NOT DONE. No charge for this imcomplete session. Will notify her MD.

## 2015-06-27 DIAGNOSIS — M1711 Unilateral primary osteoarthritis, right knee: Secondary | ICD-10-CM | POA: Diagnosis not present

## 2015-06-27 DIAGNOSIS — S83231D Complex tear of medial meniscus, current injury, right knee, subsequent encounter: Secondary | ICD-10-CM | POA: Diagnosis not present

## 2015-07-02 ENCOUNTER — Other Ambulatory Visit: Payer: Self-pay

## 2015-07-02 NOTE — Telephone Encounter (Signed)
Mirtazapine last refilled 01/10/15 for #90 with 3 refills. Ok to refill?

## 2015-07-03 ENCOUNTER — Other Ambulatory Visit: Payer: Self-pay | Admitting: Internal Medicine

## 2015-07-03 MED ORDER — MIRTAZAPINE 7.5 MG PO TABS: 7.5000 mg | ORAL_TABLET | Freq: Every day | ORAL | Status: DC

## 2015-07-26 ENCOUNTER — Other Ambulatory Visit: Payer: Self-pay | Admitting: Cardiovascular Disease

## 2015-07-30 ENCOUNTER — Other Ambulatory Visit: Payer: Self-pay | Admitting: Nurse Practitioner

## 2015-08-18 ENCOUNTER — Other Ambulatory Visit: Payer: Self-pay | Admitting: Cardiovascular Disease

## 2015-08-20 ENCOUNTER — Ambulatory Visit: Payer: Medicare Other | Admitting: Nurse Practitioner

## 2015-08-23 ENCOUNTER — Encounter: Payer: Self-pay | Admitting: Nurse Practitioner

## 2015-08-23 ENCOUNTER — Ambulatory Visit (INDEPENDENT_AMBULATORY_CARE_PROVIDER_SITE_OTHER): Payer: Medicare Other | Admitting: Nurse Practitioner

## 2015-08-23 VITALS — BP 124/62 | HR 64 | Temp 98.1°F | Resp 14 | Ht 59.0 in | Wt 141.8 lb

## 2015-08-23 DIAGNOSIS — I2583 Coronary atherosclerosis due to lipid rich plaque: Secondary | ICD-10-CM

## 2015-08-23 DIAGNOSIS — F419 Anxiety disorder, unspecified: Secondary | ICD-10-CM

## 2015-08-23 DIAGNOSIS — G479 Sleep disorder, unspecified: Secondary | ICD-10-CM

## 2015-08-23 DIAGNOSIS — R609 Edema, unspecified: Secondary | ICD-10-CM

## 2015-08-23 DIAGNOSIS — I2510 Atherosclerotic heart disease of native coronary artery without angina pectoris: Secondary | ICD-10-CM

## 2015-08-23 DIAGNOSIS — D500 Iron deficiency anemia secondary to blood loss (chronic): Secondary | ICD-10-CM

## 2015-08-23 LAB — COMPREHENSIVE METABOLIC PANEL
ALT: 33 U/L (ref 0–35)
AST: 41 U/L — ABNORMAL HIGH (ref 0–37)
Albumin: 4.2 g/dL (ref 3.5–5.2)
Alkaline Phosphatase: 47 U/L (ref 39–117)
BUN: 8 mg/dL (ref 6–23)
CO2: 27 mEq/L (ref 19–32)
Calcium: 9.4 mg/dL (ref 8.4–10.5)
Chloride: 89 mEq/L — ABNORMAL LOW (ref 96–112)
Creatinine, Ser: 0.69 mg/dL (ref 0.40–1.20)
GFR: 87.38 mL/min (ref >60.00)
Glucose, Bld: 109 mg/dL — ABNORMAL HIGH (ref 70–99)
Potassium: 5 mEq/L (ref 3.5–5.1)
Sodium: 122 mEq/L — ABNORMAL LOW (ref 135–145)
Total Bilirubin: 0.6 mg/dL (ref 0.2–1.2)
Total Protein: 7 g/dL (ref 6.0–8.3)

## 2015-08-23 LAB — CBC WITH DIFFERENTIAL/PLATELET
Basophils Absolute: 0 10*3/uL (ref 0.0–0.1)
Basophils Relative: 0.4 % (ref 0.0–3.0)
Eosinophils Absolute: 0.3 10*3/uL (ref 0.0–0.7)
Eosinophils Relative: 4.4 % (ref 0.0–5.0)
HCT: 31.6 % — ABNORMAL LOW (ref 36.0–46.0)
Hemoglobin: 10.3 g/dL — ABNORMAL LOW (ref 12.0–15.0)
Lymphocytes Relative: 29.9 % (ref 12.0–46.0)
Lymphs Abs: 1.8 10*3/uL (ref 0.7–4.0)
MCHC: 32.7 g/dL (ref 30.0–36.0)
MCV: 84.2 fl (ref 78.0–100.0)
Monocytes Absolute: 0.5 10*3/uL (ref 0.1–1.0)
Monocytes Relative: 9 % (ref 3.0–12.0)
Neutro Abs: 3.4 10*3/uL (ref 1.4–7.7)
Neutrophils Relative %: 56.3 % (ref 43.0–77.0)
Platelets: 207 10*3/uL (ref 150.0–400.0)
RBC: 3.75 Mil/uL — ABNORMAL LOW (ref 3.87–5.11)
RDW: 16.4 % — ABNORMAL HIGH (ref 11.5–15.5)
WBC: 6.1 10*3/uL (ref 4.0–10.5)

## 2015-08-23 LAB — BRAIN NATRIURETIC PEPTIDE: Pro B Natriuretic peptide (BNP): 164 pg/mL — ABNORMAL HIGH (ref 0.0–100.0)

## 2015-08-23 NOTE — Progress Notes (Signed)
Patient ID: Amy Morton, female    DOB: 06/10/1935  Age: 81 y.o. MRN: [id number]  CC: Follow-up   HPI Amy Morton presents for follow up of anxiety, peripheral edema, sleep disturbance, CAD, and anemia.   1) Didn't go to cardiac rehab because "crowded"  [place]- walks every day for 30-45 minutes   Cardio glide, bicycle, treadmill- 35-45 minutes denies chest pain, SOB    Does this every day, she reports  2) No changes to anxiety   3) Feet and hands swelling  Pitting edema 1+  81 mg ASA + Plavix + Lovaza- denies signs of bleeding spontaneously   4)  Sleep doing "okay". No further concerns   History Amy Morton has a past medical history of Anxiety; Arthritis; Depression; GERD (gastroesophageal reflux disease); Hyperlipidemia; Hypertension; Gastric ulcer; CAD (coronary artery disease); History of colonic polyps; Vitamin D deficiency; Abnormal mammogram (2007); Shingles (2008); Elevated LFTs (2010); Aortic stenosis; Anemia; Valvular heart disease; Asthma; aortic stenosis; and Peritoneal carcinomatosis ([place]) (2001).   She has past surgical history that includes Tonsillectomy (1950); Cholecystectomy (1987); Colectomy (2001); Laparoscopic bilateral salpingo oophorectomy (1988); Appendectomy; Abdominal hysterectomy (2001); Cardiac catheterization (2007); Cardiac catheterization (N/A, 04/27/2015); Cardiac catheterization (N/A, 04/27/2015); and Coronary angioplasty (1996).   Her family history includes Cancer in her father; Cancer (age of onset: 19) in her daughter; Diabetes in her brother and sister; Heart disease in her brother; Hypertension in her mother; Stroke (age of onset: 91) in her mother. There is no history of Colon cancer or Colon polyps.She reports that she has never smoked. She has never used smokeless tobacco. She reports that she does not drink alcohol or use illicit drugs.  Outpatient Prescriptions Prior to Visit  Medication Sig Dispense Refill  . acetaminophen  (TYLENOL) 650 MG CR tablet Take 650 mg by mouth as needed for pain.     Marland Kitchen albuterol (PROVENTIL HFA;VENTOLIN HFA) 108 (90 BASE) MCG/ACT inhaler Inhale 2 puffs into the lungs every 4 (four) hours as needed for wheezing or shortness of breath.     Marland Kitchen aspirin 81 MG tablet Take 81 mg by mouth daily.    Marland Kitchen atorvastatin (LIPITOR) 80 MG tablet Take 1 tablet (80 mg total) by mouth daily at 6 PM. 30 tablet 11  . busPIRone (BUSPAR) 5 MG tablet Take 1 tablet (5 mg total) by mouth 2 (two) times daily. 180 tablet 3  . calcium gluconate 650 MG tablet Take 650 mg by mouth 2 (two) times daily.    . Cholecalciferol (CVS VIT D 5000 HIGH-POTENCY) 5000 UNITS capsule One capsule every other day (Patient taking differently: Take 5,000 Units by mouth every other day. One capsule every other day)    . citalopram (CELEXA) 20 MG tablet Take 1 tablet daily (Patient taking differently: Take 20 mg by mouth daily. ) 90 tablet 3  . clopidogrel (PLAVIX) 75 MG tablet Take 1 tablet (75 mg total) by mouth daily with breakfast. 30 tablet 11  . Fluticasone-Salmeterol (ADVAIR) 250-50 MCG/DOSE AEPB Inhale 1 puff into the lungs as needed (for shortness of breath).     . isosorbide mononitrate (IMDUR) 30 MG 24 hr tablet Take 1 tablet (30 mg total) by mouth daily. 30 tablet 6  . metoprolol (LOPRESSOR) 50 MG tablet Take 1 tablet (50 mg total) by mouth 2 (two) times daily. 180 tablet 3  . mirtazapine (REMERON) 7.5 MG tablet Take 1 tablet (7.5 mg total) by mouth at bedtime. 90 tablet 3  . Multiple Vitamin (MULTIVITAMIN) capsule Take  1 capsule by mouth daily.    . nitroGLYCERIN (NITROSTAT) 0.4 MG SL tablet PLACE 1 TABLET UNDER THE TONGUE EVERY 5 MINUTES AS NEEDED FOR CHEST PAIN 25 tablet 1  . omega-3 acid ethyl esters (LOVAZA) 1 G capsule Take 2 capsules (2 g total) by mouth 2 (two) times daily. 360 capsule 3  . omeprazole (PRILOSEC) 40 MG capsule Take 1 capsule (40 mg total) by mouth daily. 30 capsule 3  . potassium chloride SA (K-DUR,KLOR-CON)  20 MEQ tablet TAKE 1 BY MOUTH DAILY --Amy Morton-- 90 tablet 0  . Probiotic Product (FLORAJEN BIFIDOBLEND) CAPS Take 1 capsule by mouth daily. 90 capsule 3  . vitamin B-12 (CYANOCOBALAMIN) 250 MCG tablet Take 250 mcg by mouth daily.    Marland Kitchen omeprazole (PRILOSEC) 20 MG capsule TAKE 1 CAPSULE BY MOUTH EVERY DAY (Patient not taking: Reported on 08/23/2015) 90 capsule 0  . sucralfate (CARAFATE) 1 GM/10ML suspension Take 10 mLs (1 g total) by mouth 4 (four) times daily -  with meals and at bedtime. (Patient not taking: Reported on 08/23/2015) 420 mL 0   No facility-administered medications prior to visit.    ROS Review of Systems  Constitutional: Negative for fever, chills, diaphoresis and fatigue.  Respiratory: Negative for cough, chest tightness, shortness of breath and wheezing.   Cardiovascular: Positive for leg swelling. Negative for chest pain and palpitations.  Gastrointestinal: Negative for nausea, vomiting, abdominal pain and diarrhea.  Musculoskeletal: Negative for back pain and neck pain.  Skin: Negative for rash.  Neurological: Negative for dizziness and headaches.  Psychiatric/Behavioral: Negative for sleep disturbance. The patient is not nervous/anxious.     Objective:  BP 124/62 mmHg  Pulse 64  Temp(Src) 98.1 F (36.7 C) (Oral)  Resp 14  Ht 4' 11"  (1.499 m)  Wt 141 lb 12.8 oz (64.32 kg)  BMI 28.62 kg/m2  SpO2 96%  Physical Exam  Constitutional: She is oriented to person, place, and time. She appears well-developed and well-nourished. No distress.  HENT:  Head: Normocephalic and atraumatic.  Right Ear: External ear normal.  Left Ear: External ear normal.  Cardiovascular: Normal rate and regular rhythm.  Exam reveals no gallop and no friction rub.   Murmur heard. No bruits of carotid arteries bilaterally  Pulmonary/Chest: Effort normal and breath sounds normal. No respiratory distress. She has no wheezes. She has no rales. She exhibits no tenderness.  Neurological: She  is alert and oriented to person, place, and time. No cranial nerve deficit. She exhibits normal muscle tone. Coordination normal.  Skin: Skin is warm and dry. No rash noted. She is not diaphoretic.  Psychiatric: She has a normal mood and affect. Her behavior is normal. Judgment and thought content normal.   Assessment & Plan:   Lyriq was seen today for follow-up.  Diagnoses and all orders for this visit:  Anemia due to chronic blood loss -     B Nat Peptide -     Comp Met (CMET) -     CBC w/Diff  Coronary artery disease due to lipid rich plaque -     B Nat Peptide -     Comp Met (CMET) -     CBC w/Diff  Peripheral edema -     B Nat Peptide  Anxiety  Sleep disturbance  I have discontinued Ms. Forni's sucralfate. I am also having her maintain her aspirin, calcium gluconate, multivitamin, acetaminophen, Cholecalciferol, vitamin B-12, FLORAJEN BIFIDOBLEND, Fluticasone-Salmeterol, albuterol, omeprazole, busPIRone, metoprolol, citalopram, omega-3 acid ethyl esters, clopidogrel, atorvastatin, isosorbide mononitrate,  mirtazapine, potassium chloride SA, and nitroGLYCERIN.  Meds ordered this encounter  Medications  . DISCONTD: omeprazole (PRILOSEC) 40 MG capsule    Sig: Take 40 mg by mouth daily.     Follow-up: Return in about 3 months (around 11/21/2015) for Follow up .

## 2015-08-23 NOTE — Patient Instructions (Addendum)
Wear you compression hose!   Take your Omeprazole at least 2 hours separated from other medications. It can make your other medications not work as well (like Plavix).   Please visit the lab before leaving today!

## 2015-08-27 DIAGNOSIS — R609 Edema, unspecified: Secondary | ICD-10-CM | POA: Insufficient documentation

## 2015-08-27 NOTE — Assessment & Plan Note (Signed)
Worsening slightly per pt. Pitting 1+ lower extremities. Will check BNP, CBC W/ diff and CMET. Pt reports she has compression hose that she doesn't wear at home. Advised her to wear these. Denies orthopnea. Pt able to exercise without SOB or DOE. Will follow

## 2015-08-27 NOTE — Assessment & Plan Note (Addendum)
Checking labs today. Stable except for minor edema of LE bilaterally. Advised her to wear compression hose and watch for weight gain of 5+ pounds in 1 day, SOB, DOE ect...  Pt was taking omeprazole with other medications in the morning. I advised her to take 2 hours away from other medications because it decreases their efficacy. Pt verbalized understanding

## 2015-08-27 NOTE — Assessment & Plan Note (Signed)
Stable. Reports "doing okay". Will follow

## 2015-08-27 NOTE — Assessment & Plan Note (Signed)
Checking a CBC w. Diff today to see trend

## 2015-08-27 NOTE — Assessment & Plan Note (Signed)
Stable. No further concerns. On celexa

## 2015-09-10 ENCOUNTER — Ambulatory Visit (INDEPENDENT_AMBULATORY_CARE_PROVIDER_SITE_OTHER): Payer: Medicare Other | Admitting: Family Medicine

## 2015-09-10 ENCOUNTER — Encounter: Payer: Self-pay | Admitting: Family Medicine

## 2015-09-10 VITALS — BP 114/66 | HR 74 | Temp 98.7°F | Ht 59.0 in | Wt 144.6 lb

## 2015-09-10 DIAGNOSIS — J209 Acute bronchitis, unspecified: Secondary | ICD-10-CM

## 2015-09-10 DIAGNOSIS — I2510 Atherosclerotic heart disease of native coronary artery without angina pectoris: Secondary | ICD-10-CM | POA: Diagnosis not present

## 2015-09-10 MED ORDER — DOXYCYCLINE HYCLATE 100 MG PO TABS
100.0000 mg | ORAL_TABLET | Freq: Two times a day (BID) | ORAL | Status: DC
Start: 2015-09-10 — End: 2015-11-12

## 2015-09-10 NOTE — Progress Notes (Signed)
Patient ID: NORVA PARGA, female   DOB: 1935/06/20, 81 y.o.   MRN: [id number]  [name], MD Phone: [phone]  Amy Morton is a 81 y.o. female who presents today for same-day visit.  Patient notes onset of cough and chest congestion on Friday. Cough productive of brown mucus. Minimal wheezes. Notes some postnasal drip with this. No shortness of breath or chest pain. No fevers or chills. No history of asthma or COPD. She notes some mild muscular soreness in her upper stomach with coughing. She's been taking Coricidin but his help some with her cough. Robitussin helps as well. Has been using albuterol inhaler 3-4 times a day.  PMH: nonsmoker.   ROS the history of present illness  Objective  Physical Exam Filed Vitals:   09/10/15 1456  BP: 114/66  Pulse: 74  Temp: 98.7 F (37.1 C)    BP Readings from Last 3 Encounters:  09/10/15 114/66  08/23/15 124/62  05/07/15 122/62   Wt Readings from Last 3 Encounters:  09/10/15 144 lb 9.6 oz (65.59 kg)  08/23/15 141 lb 12.8 oz (64.32 kg)  05/07/15 137 lb 8 oz (62.37 kg)    Physical Exam  Constitutional: She is well-developed, well-nourished, and in no distress.  HENT:  Head: Normocephalic and atraumatic.  Right Ear: External ear normal.  Left Ear: External ear normal.  Mouth/Throat: Oropharynx is clear and moist. No oropharyngeal exudate.  Eyes: Conjunctivae are normal. Pupils are equal, round, and reactive to light.  Neck: Neck supple.  Cardiovascular: Normal rate and regular rhythm.  Exam reveals no gallop and no friction rub.   Murmur (2/6 systolic ejection murmur) heard. Pulmonary/Chest: Effort normal and breath sounds normal. No respiratory distress. She has no wheezes. She has no rales.  Abdominal: Soft. Bowel sounds are normal. She exhibits no distension. There is no tenderness. There is no rebound and no guarding.  Lymphadenopathy:    She has no cervical adenopathy.  Neurological: She is alert. Gait  normal.  Skin: Skin is warm and dry. She is not diaphoretic.     Assessment/Plan: Please see individual problem list.  Acute bronchitis Symptoms most consistent with acute bronchitis. We will treat with doxycycline. Discussed prednisone, though given her recent stent placement late last year will avoid this medication at this time. Continue albuterol. Discussed supportive care. Given return precautions.    Meds ordered this encounter  Medications  . doxycycline (VIBRA-TABS) 100 MG tablet    Sig: Take 1 tablet (100 mg total) by mouth 2 (two) times daily.    Dispense:  14 tablet    Refill:  0     [name]

## 2015-09-10 NOTE — Patient Instructions (Signed)
Nice to meet you. Next You likely a bronchitis. We will treat this with doxycycline. You need to take a probiotic while on this. If you develop chest pain, shortness of breath, cough productive of blood, fevers, or any new or changing symptoms please seek medical attention.

## 2015-09-10 NOTE — Progress Notes (Signed)
Pre visit review using our clinic review tool, if applicable. No additional management support is needed unless otherwise documented below in the visit note. 

## 2015-09-10 NOTE — Assessment & Plan Note (Addendum)
Symptoms most consistent with acute bronchitis. We will treat with doxycycline. Discussed prednisone, though given her recent stent placement late last year will avoid this medication at this time. Continue albuterol. Discussed supportive care. Given return precautions.

## 2015-09-19 ENCOUNTER — Emergency Department: Payer: Medicare Other

## 2015-09-19 ENCOUNTER — Emergency Department
Admission: EM | Admit: 2015-09-19 | Discharge: 2015-09-20 | Disposition: A | Discharge status: Home / Self Care | Payer: Medicare Other | Attending: Emergency Medicine | Admitting: Emergency Medicine

## 2015-09-19 ENCOUNTER — Encounter: Payer: Self-pay | Admitting: *Deleted

## 2015-09-19 DIAGNOSIS — S0003XA Contusion of scalp, initial encounter: Secondary | ICD-10-CM | POA: Diagnosis not present

## 2015-09-19 DIAGNOSIS — R42 Dizziness and giddiness: Secondary | ICD-10-CM

## 2015-09-19 DIAGNOSIS — S060X0A Concussion without loss of consciousness, initial encounter: Secondary | ICD-10-CM | POA: Diagnosis not present

## 2015-09-19 DIAGNOSIS — Z7982 Long term (current) use of aspirin: Secondary | ICD-10-CM | POA: Diagnosis not present

## 2015-09-19 DIAGNOSIS — Z79899 Other long term (current) drug therapy: Secondary | ICD-10-CM | POA: Diagnosis not present

## 2015-09-19 DIAGNOSIS — Y998 Other external cause status: Secondary | ICD-10-CM | POA: Insufficient documentation

## 2015-09-19 DIAGNOSIS — I10 Essential (primary) hypertension: Secondary | ICD-10-CM | POA: Diagnosis not present

## 2015-09-19 DIAGNOSIS — W08XXXA Fall from other furniture, initial encounter: Secondary | ICD-10-CM | POA: Diagnosis not present

## 2015-09-19 DIAGNOSIS — Y9389 Activity, other specified: Secondary | ICD-10-CM | POA: Diagnosis not present

## 2015-09-19 DIAGNOSIS — S0990XA Unspecified injury of head, initial encounter: Secondary | ICD-10-CM

## 2015-09-19 DIAGNOSIS — W19XXXA Unspecified fall, initial encounter: Secondary | ICD-10-CM

## 2015-09-19 DIAGNOSIS — Z792 Long term (current) use of antibiotics: Secondary | ICD-10-CM | POA: Diagnosis not present

## 2015-09-19 DIAGNOSIS — Z7902 Long term (current) use of antithrombotics/antiplatelets: Secondary | ICD-10-CM | POA: Insufficient documentation

## 2015-09-19 DIAGNOSIS — Y9289 Other specified places as the place of occurrence of the external cause: Secondary | ICD-10-CM | POA: Insufficient documentation

## 2015-09-19 DIAGNOSIS — S199XXA Unspecified injury of neck, initial encounter: Secondary | ICD-10-CM | POA: Diagnosis not present

## 2015-09-19 NOTE — ED Notes (Signed)
Pt fell off stool trying to get a bowl, hematoma noted to back of head.

## 2015-09-20 DIAGNOSIS — S060X0A Concussion without loss of consciousness, initial encounter: Secondary | ICD-10-CM | POA: Diagnosis not present

## 2015-09-20 LAB — CBC WITH DIFFERENTIAL/PLATELET
Basophils Absolute: 0.1 10*3/uL (ref 0–0.1)
Basophils Relative: 1 %
Eosinophils Absolute: 0.2 10*3/uL (ref 0–0.7)
Eosinophils Relative: 2 %
HCT: 31.9 % — ABNORMAL LOW (ref 35.0–47.0)
Hemoglobin: 10.5 g/dL — ABNORMAL LOW (ref 12.0–16.0)
Lymphocytes Relative: 14 %
Lymphs Abs: 1.7 10*3/uL (ref 1.0–3.6)
MCH: 27.6 pg (ref 26.0–34.0)
MCHC: 33 g/dL (ref 32.0–36.0)
MCV: 83.5 fL (ref 80.0–100.0)
Monocytes Absolute: 0.7 10*3/uL (ref 0.2–0.9)
Monocytes Relative: 6 %
Neutro Abs: 9.1 10*3/uL — ABNORMAL HIGH (ref 1.4–6.5)
Neutrophils Relative %: 77 %
Platelets: 237 10*3/uL (ref 150–440)
RBC: 3.82 MIL/uL (ref 3.80–5.20)
RDW: 17.6 % — ABNORMAL HIGH (ref 11.5–14.5)
WBC: 11.7 10*3/uL — ABNORMAL HIGH (ref 3.6–11.0)

## 2015-09-20 LAB — BASIC METABOLIC PANEL
Anion gap: 10 (ref 5–15)
BUN: 13 mg/dL (ref 6–20)
CO2: 23 mmol/L (ref 22–32)
Calcium: 9.3 mg/dL (ref 8.9–10.3)
Chloride: 97 mmol/L — ABNORMAL LOW (ref 101–111)
Creatinine, Ser: 0.73 mg/dL (ref 0.44–1.00)
GFR calc Af Amer: >60 mL/min (ref >60)
GFR calc non Af Amer: >60 mL/min (ref >60)
Glucose, Bld: 122 mg/dL — ABNORMAL HIGH (ref 65–99)
Potassium: 4.6 mmol/L (ref 3.5–5.1)
Sodium: 130 mmol/L — ABNORMAL LOW (ref 135–145)

## 2015-09-20 MED ORDER — SODIUM CHLORIDE 0.9 % IV BOLUS (SEPSIS)
1000.0000 mL | Freq: Once | INTRAVENOUS | Status: AC
  Administered 2015-09-20: 1000 mL via INTRAVENOUS

## 2015-09-20 MED ORDER — MECLIZINE HCL 25 MG PO TABS: 25.0000 mg | ORAL_TABLET | Freq: Once | ORAL | Status: DC

## 2015-09-20 MED ORDER — MECLIZINE HCL 25 MG PO TABS
25.0000 mg | ORAL_TABLET | Freq: Once | ORAL | Status: AC
  Administered 2015-09-20: 25 mg via ORAL

## 2015-09-20 MED ORDER — ONDANSETRON 4 MG PO TBDP: 4.0000 mg | ORAL_TABLET | Freq: Three times a day (TID) | ORAL | Status: DC | PRN

## 2015-09-20 MED ORDER — MECLIZINE HCL 25 MG PO TABS
ORAL_TABLET | ORAL | Status: AC
  Administered 2015-09-20: 25 mg via ORAL
  Filled 2015-09-20: qty 1

## 2015-09-20 MED ORDER — MECLIZINE HCL 25 MG PO TABS
25.0000 mg | ORAL_TABLET | Freq: Three times a day (TID) | ORAL | Status: DC | PRN
Start: 2015-09-20 — End: 2015-11-12

## 2015-09-20 NOTE — ED Provider Notes (Signed)
[facility] Emergency Department Provider Note  ____________________________________________  Time seen: Approximately 12:02 AM  I have reviewed the triage vital signs and the nursing notes.   HISTORY  Chief Complaint Head Injury and Fall    HPI Amy Morton is a 81 y.o. female who presents to the ED from home with a chief complaint of fall with head injury. Patient reports she was on a low-lying stool trying to get a bowl when she lost her balance and fell backwards, striking the back of her head. Denies LOC. Denies associated symptoms of neck pain or vision changes. Does not complain of headache. Since the fall, she reports feeling dizzy (head spinning) when changing positions from sitting to standing and vice versa. Denies recent fever, chills, chest pain, shortness of breath, abdominal pain, nausea, vomiting, dysuria, diarrhea. States 2 weeks ago she had cold type symptoms and has recovered completely. Denies recent travel. Keeping her head still makes her dizziness better; moving makes her dizziness worse.   Past Medical History  Diagnosis Date  . Anxiety   . Arthritis   . Depression   . GERD (gastroesophageal reflux disease)   . Hyperlipidemia   . Hypertension   . Gastric ulcer   . CAD (coronary artery disease)     s/p angioplasty 1996  . History of colonic polyps     Dr. Vira Agar  . Vitamin D deficiency   . Abnormal mammogram 2007    Recommend close follow up  . Shingles 2008    T-9 distribution  . Elevated LFTs 2010    s/p ultrasound w/ possible fatty liver; GI consult. Resolution 2011  . Aortic stenosis     mild to moderate  . Anemia     Iron, b12, SPEP, UPEP normal, 12/2012  . Valvular heart disease     Mild to Moderate MR, AS  . Asthma   . aortic stenosis   . Peritoneal carcinomatosis [facility]) 2001    Dr. Oliva Bustard & Dr. Claiborne Rigg s/p chemo    Patient Active Problem List   Diagnosis Date Noted  . Acute bronchitis 09/10/2015  .  Peripheral edema 08/27/2015  . CAD (coronary artery disease) 04/27/2015  . Unstable angina ([place]) 04/25/2015  . Anemia due to chronic blood loss 01/13/2015  . Abnormal findings on radiological examination of gastrointestinal tract 11/22/2014  . Nausea with vomiting 11/22/2014  . Abdominal pain, epigastric 11/22/2014  . Right knee pain 07/09/2014  . Long-term use of high-risk medication 05/12/2014  . Anemia 05/12/2014  . Acute upper respiratory infections of unspecified site 04/18/2014  . Aortic valve stenosis, acquired 04/18/2014  . CAD S/P percutaneous coronary angioplasty 04/18/2014  . Peritoneal carcinoma ([place]) 04/17/2014  . Chronic diarrhea 04/17/2014  . Dyslipidemia, goal LDL below 70 04/17/2014  . CAD in native artery 04/17/2014  . Sleep disturbance 12/13/2013  . Anxiety 11/13/2013  . Vitamin D deficiency 11/13/2013    Past Surgical History  Procedure Laterality Date  . Tonsillectomy  1950  . Cholecystectomy  1987  . Colectomy  2001  . Laparoscopic bilateral salpingo oopherectomy  1988    Dr. Laverta [place]  . Appendectomy    . Abdominal hysterectomy  2001  . Cardiac catheterization  2007    50% mid LAD stenosis, 70% proximal RCA with 100% distal RCA stenosis with collaterals, EF 65%  . Cardiac catheterization N/A 04/27/2015    Procedure: Left Heart Cath and Coronary Angiography;  Surgeon: Jettie Booze, MD;  Location: [place] [facility];  Service: Cardiovascular;  Laterality: N/A;  . Cardiac catheterization N/A 04/27/2015    Procedure: Coronary Stent Intervention;  Surgeon: Jettie Booze, MD;  Location: [place] [facility];  Service: Cardiovascular;  Laterality: N/A;  . Coronary angioplasty  1996    Current Outpatient Rx  Name  Route  Sig  Dispense  Refill  . acetaminophen (TYLENOL) 650 MG CR tablet   Oral   Take 650 mg by mouth as needed for pain.          Marland Kitchen albuterol (PROVENTIL HFA;VENTOLIN HFA) 108 (90 BASE) MCG/ACT inhaler   Inhalation   Inhale 2 puffs into  the lungs every 4 (four) hours as needed for wheezing or shortness of breath.          Marland Kitchen aspirin 81 MG tablet   Oral   Take 81 mg by mouth daily.         Marland Kitchen atorvastatin (LIPITOR) 80 MG tablet   Oral   Take 1 tablet (80 mg total) by mouth daily at 6 PM.   30 tablet   11   . busPIRone (BUSPAR) 5 MG tablet   Oral   Take 1 tablet (5 mg total) by mouth 2 (two) times daily.   180 tablet   3   . calcium gluconate 650 MG tablet   Oral   Take 650 mg by mouth 2 (two) times daily.         . Cholecalciferol ([employer] VIT D 5000 HIGH-POTENCY) 5000 UNITS capsule      One capsule every other day Patient taking differently: Take 5,000 Units by mouth every other day. One capsule every other day         . citalopram (CELEXA) 20 MG tablet      Take 1 tablet daily Patient taking differently: Take 20 mg by mouth daily.    90 tablet   3   . clopidogrel (PLAVIX) 75 MG tablet   Oral   Take 1 tablet (75 mg total) by mouth daily with breakfast.   30 tablet   11   . doxycycline (VIBRA-TABS) 100 MG tablet   Oral   Take 1 tablet (100 mg total) by mouth 2 (two) times daily.   14 tablet   0   . Fluticasone-Salmeterol (ADVAIR) 250-50 MCG/DOSE AEPB   Inhalation   Inhale 1 puff into the lungs as needed (for shortness of breath).          . isosorbide mononitrate (IMDUR) 30 MG 24 hr tablet   Oral   Take 1 tablet (30 mg total) by mouth daily.   30 tablet   6   . metoprolol (LOPRESSOR) 50 MG tablet   Oral   Take 1 tablet (50 mg total) by mouth 2 (two) times daily.   180 tablet   3   . mirtazapine (REMERON) 7.5 MG tablet   Oral   Take 1 tablet (7.5 mg total) by mouth at bedtime.   90 tablet   3   . Multiple Vitamin (MULTIVITAMIN) capsule   Oral   Take 1 capsule by mouth daily.         . nitroGLYCERIN (NITROSTAT) 0.4 MG SL tablet      PLACE 1 TABLET UNDER THE TONGUE EVERY 5 MINUTES AS NEEDED FOR CHEST PAIN   25 tablet   1     **Patient requests 90 days supply**   .  omega-3 acid ethyl esters (LOVAZA) 1 G capsule   Oral   Take 2  capsules (2 g total) by mouth 2 (two) times daily.   360 capsule   3   . omeprazole (PRILOSEC) 40 MG capsule   Oral   Take 1 capsule (40 mg total) by mouth daily.   30 capsule   3   . potassium chloride SA (K-DUR,KLOR-CON) 20 MEQ tablet      TAKE 1 BY MOUTH DAILY --DOSS,CARRIE--   90 tablet   0   . Probiotic Product (FLORAJEN BIFIDOBLEND) CAPS   Oral   Take 1 capsule by mouth daily.   90 capsule   3   . vitamin B-12 (CYANOCOBALAMIN) 250 MCG tablet   Oral   Take 250 mcg by mouth daily.           Allergies Abilify  Family History  Problem Relation Age of Onset  . Hypertension Mother   . Stroke Mother 51    Cerebral Hemorrhage  . Cancer Father     bone cancer  . Diabetes Sister   . Diabetes Brother   . Heart disease Brother   . Cancer Daughter 32    Ovarian cancer  . Colon cancer Neg Hx   . Colon polyps Neg Hx     Social History Social History  Substance Use Topics  . Smoking status: Never Smoker   . Smokeless tobacco: Never Used  . Alcohol Use: No    Review of Systems Constitutional: No fever/chills. Eyes: No visual changes. ENT: No sore throat. Cardiovascular: Denies chest pain. Respiratory: Denies shortness of breath. Gastrointestinal: No abdominal pain.  No nausea, no vomiting.  No diarrhea.  No constipation. Genitourinary: Negative for dysuria. Musculoskeletal: Negative for back pain. Skin: Negative for rash. Neurological: Positive for dizziness. Negative for headaches, focal weakness or numbness.  10-point ROS otherwise negative.  ____________________________________________   PHYSICAL EXAM:  VITAL SIGNS: ED Triage Vitals  Enc Vitals Group     BP 09/19/15 2040 150/63 mmHg     Pulse Rate 09/19/15 2040 76     Resp 09/19/15 2252 17     Temp 09/19/15 2040 98.6 F (37 C)     Temp Source 09/19/15 2040 Oral     SpO2 09/19/15 2040 99 %     Weight 09/19/15 2040 135 lb  (61.236 kg)     Height 09/19/15 2040 4\' 11"  (1.499 m)     Head Cir --      Peak Flow --      Pain Score 09/19/15 2045 8     Pain Loc --      Pain Edu? --      Excl. in [place]? --     Constitutional: Alert and oriented. Well appearing and in no acute distress. Eyes: Conjunctivae are normal. PERRL. EOMI. Head: Small hematoma to posterior head. Nose: No congestion/rhinnorhea. Mouth/Throat: Mucous membranes are moist.  Oropharynx non-erythematous. Neck: No stridor.  No cervical spine tenderness to palpation.  No step-offs or deformities.  No carotid bruits. Cardiovascular: Normal rate, regular rhythm. II/VI SEM.  Good peripheral circulation. Respiratory: Normal respiratory effort.  No retractions. Lungs CTAB. Gastrointestinal: Soft and nontender. No distention. No abdominal bruits. No CVA tenderness. Musculoskeletal: No spinal tenderness to palpation. No lower extremity tenderness nor edema.  No joint effusions. Neurologic:  Normal speech and language. No gross focal neurologic deficits are appreciated. No gait instability. Skin:  Skin is warm, dry and intact. No rash noted. Psychiatric: Mood and affect are normal. Speech and behavior are normal.  ____________________________________________   LABS (all labs ordered are  listed, but only abnormal results are displayed)  Labs Reviewed  CBC WITH DIFFERENTIAL/PLATELET - Abnormal; Notable for the following:    WBC 11.7 (*)    Hemoglobin 10.5 (*)    HCT 31.9 (*)    RDW 17.6 (*)    Neutro Abs 9.1 (*)    All other components within normal limits  BASIC METABOLIC PANEL - Abnormal; Notable for the following:    Sodium 130 (*)    Chloride 97 (*)    Glucose, Bld 122 (*)    All other components within normal limits   ____________________________________________  EKG  ED ECG REPORT I, Kesleigh Morson J, the attending physician, personally viewed and interpreted this ECG.   Date: 09/20/2015  EKG Time: 0038  Rate: 72  Rhythm: normal EKG,  normal sinus rhythm  Axis: Normal  Intervals:none  ST&T Change: Nonspecific  ____________________________________________  RADIOLOGY  CT head and cervical spine without contrast interpreted per Dr. Tery Sanfilippo: 1. No acute intracranial abnormality. 2. Atrophy with chronic small vessel white matter ischemic disease. 3. Degenerative changes in the cervical spine without fracture. ____________________________________________   PROCEDURES  Procedure(s) performed: None  Critical Care performed: No  ____________________________________________   INITIAL IMPRESSION / ASSESSMENT AND PLAN / ED COURSE  Pertinent labs & imaging results that were available during my care of the patient were reviewed by me and considered in my medical decision making (see chart for details).  81 year old female who presents s/p mechanical fall with posterior scalp hematoma. No LOC initially; now with dizziness on movement. Will obtain screening lab work, EKG, infuse fluids and administer meclizine.  ----------------------------------------- 2:15 AM on 09/20/2015 -----------------------------------------  Patient improved. Updated patient and daughter of negative imaging studies for acute traumatic injury and mild hyponatremia noted on lab work. Strict return precautions given. Patient and daughter verbalize understanding and agree with plan of care. ____________________________________________   FINAL CLINICAL IMPRESSION(S) / ED DIAGNOSES  Final diagnoses:  Fall, initial encounter  Head injury, initial encounter  Scalp contusion, initial encounter  Dizziness  Concussion, without loss of consciousness, initial encounter      Paulette Blanch, MD 09/20/15 [phone]

## 2015-09-20 NOTE — ED Notes (Signed)
Pt reports feeling dizzy when changing positions from sitting to standing and from standing to sitting. Pt reports symptoms began after falling yesterday . Pt denies other complaints at this time.

## 2015-09-20 NOTE — ED Notes (Signed)

## 2015-09-20 NOTE — Discharge Instructions (Signed)
1. You may take medicines as needed for dizziness and nausea (Meclizine/Zofran #20). 2. Return to the ER for worsening symptoms, persistent vomiting, difficulty breathing or other concerns.  Head Injury, Adult You have received a head injury. It does not appear serious at this time. Headaches and vomiting are common following head injury. It should be easy to awaken from sleeping. Sometimes it is necessary for you to stay in the emergency department for a while for observation. Sometimes admission to the hospital may be needed. After injuries such as yours, most problems occur within the first 24 hours, but side effects may occur up to 7-10 days after the injury. It is important for you to carefully monitor your condition and contact your health care provider or seek immediate medical care if there is a change in your condition. WHAT ARE THE TYPES OF HEAD INJURIES? Head injuries can be as minor as a bump. Some head injuries can be more severe. More severe head injuries include:  A jarring injury to the brain (concussion).  A bruise of the brain (contusion). This mean there is bleeding in the brain that can cause swelling.  A cracked skull (skull fracture).  Bleeding in the brain that collects, clots, and forms a bump (hematoma). WHAT CAUSES A HEAD INJURY? A serious head injury is most likely to happen to someone who is in a car wreck and is not wearing a seat belt. Other causes of major head injuries include bicycle or motorcycle accidents, sports injuries, and falls. HOW ARE HEAD INJURIES DIAGNOSED? A complete history of the event leading to the injury and your current symptoms will be helpful in diagnosing head injuries. Many times, pictures of the brain, such as CT or MRI are needed to see the extent of the injury. Often, an overnight hospital stay is necessary for observation.  WHEN SHOULD I SEEK IMMEDIATE MEDICAL CARE?  You should get help right away if:  You have confusion or  drowsiness.  You feel sick to your stomach (nauseous) or have continued, forceful vomiting.  You have dizziness or unsteadiness that is getting worse.  You have severe, continued headaches not relieved by medicine. Only take over-the-counter or prescription medicines for pain, fever, or discomfort as directed by your health care provider.  You do not have normal function of the arms or legs or are unable to walk.  You notice changes in the black spots in the center of the colored part of your eye (pupil).  You have a clear or bloody fluid coming from your nose or ears.  You have a loss of vision. During the next 24 hours after the injury, you must stay with someone who can watch you for the warning signs. This person should contact local emergency services (911 in the [place] if you have seizures, you become unconscious, or you are unable to wake up. HOW CAN I PREVENT A HEAD INJURY IN THE FUTURE? The most important factor for preventing major head injuries is avoiding motor vehicle accidents. To minimize the potential for damage to your head, it is crucial to wear seat belts while riding in motor vehicles. Wearing helmets while bike riding and playing collision sports (like football) is also helpful. Also, avoiding dangerous activities around the house will further help reduce your risk of head injury.  WHEN CAN I RETURN TO NORMAL ACTIVITIES AND ATHLETICS? You should be reevaluated by your health care provider before returning to these activities. If you have any of the following symptoms, you should  not return to activities or contact sports until 1 week after the symptoms have stopped:  Persistent headache.  Dizziness or vertigo.  Poor attention and concentration.  Confusion.  Memory problems.  Nausea or vomiting.  Fatigue or tire easily.  Irritability.  Intolerant of bright lights or loud noises.  Anxiety or depression.  Disturbed sleep. MAKE SURE YOU:   Understand these  instructions.  Will watch your condition.  Will get help right away if you are not doing well or get worse.   This information is not intended to replace advice given to you by your health care provider. Make sure you discuss any questions you have with your health care provider.   Document Released: 07/14/2005 Document Revised: 08/04/2014 Document Reviewed: 03/21/2013 Elsevier Interactive Patient Education 2016 [place], Adult A concussion, or closed-head injury, is a brain injury caused by a direct blow to the head or by a quick and sudden movement (jolt) of the head or neck. Concussions are usually not life-threatening. Even so, the effects of a concussion can be serious. If you have had a concussion before, you are more likely to experience concussion-like symptoms after a direct blow to the head.  CAUSES  Direct blow to the head, such as from running into another player during a soccer game, being hit in a fight, or hitting your head on a hard surface.  A jolt of the head or neck that causes the brain to move back and forth inside the skull, such as in a car crash. SIGNS AND SYMPTOMS The signs of a concussion can be hard to notice. Early on, they may be missed by you, family members, and health care providers. You may look fine but act or feel differently. Symptoms are usually temporary, but they may last for days, weeks, or even longer. Some symptoms may appear right away while others may not show up for hours or days. Every head injury is different. Symptoms include:  Mild to moderate headaches that will not go away.  A feeling of pressure inside your head.  Having more trouble than usual:  Learning or remembering things you have heard.  Answering questions.  Paying attention or concentrating.  Organizing daily tasks.  Making decisions and solving problems.  Slowness in thinking, acting or reacting, speaking, or reading.  Getting lost or being easily  confused.  Feeling tired all the time or lacking energy (fatigued).  Feeling drowsy.  Sleep disturbances.  Sleeping more than usual.  Sleeping less than usual.  Trouble falling asleep.  Trouble sleeping (insomnia).  Loss of balance or feeling lightheaded or dizzy.  Nausea or vomiting.  Numbness or tingling.  Increased sensitivity to:  Sounds.  Lights.  Distractions.  Vision problems or eyes that tire easily.  Diminished sense of taste or smell.  Ringing in the ears.  Mood changes such as feeling sad or anxious.  Becoming easily irritated or angry for little or no reason.  Lack of motivation.  Seeing or hearing things other people do not see or hear (hallucinations). DIAGNOSIS Your health care provider can usually diagnose a concussion based on a description of your injury and symptoms. He or she will ask whether you passed out (lost consciousness) and whether you are having trouble remembering events that happened right before and during your injury. Your evaluation might include:  A brain scan to look for signs of injury to the brain. Even if the test shows no injury, you may still have a concussion.  Blood tests to be sure other problems are not present. TREATMENT  Concussions are usually treated in an emergency department, in urgent care, or at a clinic. You may need to stay in the hospital overnight for further treatment.  Tell your health care provider if you are taking any medicines, including prescription medicines, over-the-counter medicines, and natural remedies. Some medicines, such as blood thinners (anticoagulants) and aspirin, may increase the chance of complications. Also tell your health care provider whether you have had alcohol or are taking illegal drugs. This information may affect treatment.  Your health care provider will send you home with important instructions to follow.  How fast you will recover from a concussion depends on many  factors. These factors include how severe your concussion is, what part of your brain was injured, your age, and how healthy you were before the concussion.  Most people with mild injuries recover fully. Recovery can take time. In general, recovery is slower in older persons. Also, persons who have had a concussion in the past or have other medical problems may find that it takes longer to recover from their current injury. HOME CARE INSTRUCTIONS General Instructions  Carefully follow the directions your health care provider gave you.  Only take over-the-counter or prescription medicines for pain, discomfort, or fever as directed by your health care provider.  Take only those medicines that your health care provider has approved.  Do not drink alcohol until your health care provider says you are well enough to do so. Alcohol and certain other drugs may slow your recovery and can put you at risk of further injury.  If it is harder than usual to remember things, write them down.  If you are easily distracted, try to do one thing at a time. For example, do not try to watch TV while fixing dinner.  Talk with family members or close friends when making important decisions.  Keep all follow-up appointments. Repeated evaluation of your symptoms is recommended for your recovery.  Watch your symptoms and tell others to do the same. Complications sometimes occur after a concussion. Older adults with a brain injury may have a higher risk of serious complications, such as a blood clot on the brain.  Tell your teachers, school nurse, school counselor, coach, athletic trainer, or work Freight forwarder about your injury, symptoms, and restrictions. Tell them about what you can or cannot do. They should watch for:  Increased problems with attention or concentration.  Increased difficulty remembering or learning new information.  Increased time needed to complete tasks or assignments.  Increased irritability  or decreased ability to cope with stress.  Increased symptoms.  Rest. Rest helps the brain to heal. Make sure you:  Get plenty of sleep at night. Avoid staying up late at night.  Keep the same bedtime hours on weekends and weekdays.  Rest during the day. Take daytime naps or rest breaks when you feel tired.  Limit activities that require a lot of thought or concentration. These include:  Doing homework or job-related work.  Watching TV.  Working on the computer.  Avoid any situation where there is potential for another head injury (football, hockey, soccer, basketball, martial arts, downhill snow sports and horseback riding). Your condition will get worse every time you experience a concussion. You should avoid these activities until you are evaluated by the appropriate follow-up health care providers. Returning To Your Regular Activities You will need to return to your normal activities slowly, not all at once. You  must give your body and brain enough time for recovery.  Do not return to sports or other athletic activities until your health care provider tells you it is safe to do so.  Ask your health care provider when you can drive, ride a bicycle, or operate heavy machinery. Your ability to react may be slower after a brain injury. Never do these activities if you are dizzy.  Ask your health care provider about when you can return to work or school. Preventing Another Concussion It is very important to avoid another brain injury, especially before you have recovered. In rare cases, another injury can lead to permanent brain damage, brain swelling, or death. The risk of this is greatest during the first 7-10 days after a head injury. Avoid injuries by:  Wearing a seat belt when riding in a car.  Drinking alcohol only in moderation.  Wearing a helmet when biking, skiing, skateboarding, skating, or doing similar activities.  Avoiding activities that could lead to a second  concussion, such as contact or recreational sports, until your health care provider says it is okay.  Taking safety measures in your home.  Remove clutter and tripping hazards from floors and stairways.  Use grab bars in bathrooms and handrails by stairs.  Place non-slip mats on floors and in bathtubs.  Improve lighting in dim areas. SEEK MEDICAL CARE IF:  You have increased problems paying attention or concentrating.  You have increased difficulty remembering or learning new information.  You need more time to complete tasks or assignments than before.  You have increased irritability or decreased ability to cope with stress.  You have more symptoms than before. Seek medical care if you have any of the following symptoms for more than 2 weeks after your injury:  Lasting (chronic) headaches.  Dizziness or balance problems.  Nausea.  Vision problems.  Increased sensitivity to noise or light.  Depression or mood swings.  Anxiety or irritability.  Memory problems.  Difficulty concentrating or paying attention.  Sleep problems.  Feeling tired all the time. SEEK IMMEDIATE MEDICAL CARE IF:  You have severe or worsening headaches. These may be a sign of a blood clot in the brain.  You have weakness (even if only in one hand, leg, or part of the face).  You have numbness.  You have decreased coordination.  You vomit repeatedly.  You have increased sleepiness.  One pupil is larger than the other.  You have convulsions.  You have slurred speech.  You have increased confusion. This may be a sign of a blood clot in the brain.  You have increased restlessness, agitation, or irritability.  You are unable to recognize people or places.  You have neck pain.  It is difficult to wake you up.  You have unusual behavior changes.  You lose consciousness. MAKE SURE YOU:  Understand these instructions.  Will watch your condition.  Will get help right away  if you are not doing well or get worse.   This information is not intended to replace advice given to you by your health care provider. Make sure you discuss any questions you have with your health care provider.   Document Released: 10/04/2003 Document Revised: 08/04/2014 Document Reviewed: 02/03/2013 Elsevier Interactive Patient Education 2016 [employer]  Dizziness Dizziness is a common problem. It is a feeling of unsteadiness or light-headedness. You may feel like you are about to faint. Dizziness can lead to injury if you stumble or fall. Anyone can become dizzy, but  dizziness is more common in older adults. This condition can be caused by a number of things, including medicines, dehydration, or illness. HOME CARE INSTRUCTIONS Taking these steps may help with your condition: Eating and Drinking  Drink enough fluid to keep your urine clear or pale yellow. This helps to keep you from becoming dehydrated. Try to drink more clear fluids, such as water.  Do not drink alcohol.  Limit your caffeine intake if directed by your health care provider.  Limit your salt intake if directed by your health care provider. Activity  Avoid making quick movements.  Rise slowly from chairs and steady yourself until you feel okay.  In the morning, first sit up on the side of the bed. When you feel okay, stand slowly while you hold onto something until you know that your balance is fine.  Move your legs often if you need to stand in one place for a long time. Tighten and relax your muscles in your legs while you are standing.  Do not drive or operate heavy machinery if you feel dizzy.  Avoid bending down if you feel dizzy. Place items in your home so that they are easy for you to reach without leaning over. Lifestyle  Do not use any tobacco products, including cigarettes, chewing tobacco, or electronic cigarettes. If you need help quitting, ask your health care provider.  Try to reduce your  stress level, such as with yoga or meditation. Talk with your health care provider if you need help. General Instructions  Watch your dizziness for any changes.  Take medicines only as directed by your health care provider. Talk with your health care provider if you think that your dizziness is caused by a medicine that you are taking.  Tell a friend or a family member that you are feeling dizzy. If he or she notices any changes in your behavior, have this person call your health care provider.  Keep all follow-up visits as directed by your health care provider. This is important. SEEK MEDICAL CARE IF:  Your dizziness does not go away.  Your dizziness or light-headedness gets worse.  You feel nauseous.  You have reduced hearing.  You have new symptoms.  You are unsteady on your feet or you feel like the room is spinning. SEEK IMMEDIATE MEDICAL CARE IF:  You vomit or have diarrhea and are unable to eat or drink anything.  You have problems talking, walking, swallowing, or using your arms, hands, or legs.  You feel generally weak.  You are not thinking clearly or you have trouble forming sentences. It may take a friend or family member to notice this.  You have chest pain, abdominal pain, shortness of breath, or sweating.  Your vision changes.  You notice any bleeding.  You have a headache.  You have neck pain or a stiff neck.  You have a fever.   This information is not intended to replace advice given to you by your health care provider. Make sure you discuss any questions you have with your health care provider.   Document Released: 01/07/2001 Document Revised: 11/28/2014 Document Reviewed: 07/10/2014 Elsevier Interactive Patient Education 2016 [employer]  Facial or Scalp Contusion A facial or scalp contusion is a deep bruise on the face or head. Injuries to the face and head generally cause a lot of swelling, especially around the eyes. Contusions are the  result of an injury that caused bleeding under the skin. The contusion may turn blue, purple, or yellow.  Minor injuries will give you a painless contusion, but more severe contusions may stay painful and swollen for a few weeks.  CAUSES  A facial or scalp contusion is caused by a blunt injury or trauma to the face or head area.  SIGNS AND SYMPTOMS   Swelling of the injured area.   Discoloration of the injured area.   Tenderness, soreness, or pain in the injured area.  DIAGNOSIS  The diagnosis can be made by taking a medical history and doing a physical exam. An X-ray exam, CT scan, or MRI may be needed to determine if there are any associated injuries, such as broken bones (fractures). TREATMENT  Often, the best treatment for a facial or scalp contusion is applying cold compresses to the injured area. Over-the-counter medicines may also be recommended for pain control.  HOME CARE INSTRUCTIONS   Only take over-the-counter or prescription medicines as directed by your health care provider.   Apply ice to the injured area.   Put ice in a plastic bag.   Place a towel between your skin and the bag.   Leave the ice on for 20 minutes, 2-3 times a day.  SEEK MEDICAL CARE IF:  You have bite problems.   You have pain with chewing.   You are concerned about facial defects. SEEK IMMEDIATE MEDICAL CARE IF:  You have severe pain or a headache that is not relieved by medicine.   You have unusual sleepiness, confusion, or personality changes.   You throw up (vomit).   You have a persistent nosebleed.   You have double vision or blurred vision.   You have fluid drainage from your nose or ear.   You have difficulty walking or using your arms or legs.  MAKE SURE YOU:   Understand these instructions.  Will watch your condition.  Will get help right away if you are not doing well or get worse.   This information is not intended to replace advice given to you by your  health care provider. Make sure you discuss any questions you have with your health care provider.   Document Released: 08/21/2004 Document Revised: 08/04/2014 Document Reviewed: 02/24/2013 [employer] Interactive Patient Education [employer].

## 2015-09-24 ENCOUNTER — Ambulatory Visit (INDEPENDENT_AMBULATORY_CARE_PROVIDER_SITE_OTHER): Payer: Medicare Other | Admitting: Nurse Practitioner

## 2015-09-24 ENCOUNTER — Encounter: Payer: Self-pay | Admitting: Nurse Practitioner

## 2015-09-24 VITALS — BP 108/64 | HR 70 | Temp 98.3°F | Resp 14 | Ht 59.0 in | Wt 140.0 lb

## 2015-09-24 DIAGNOSIS — I2510 Atherosclerotic heart disease of native coronary artery without angina pectoris: Secondary | ICD-10-CM | POA: Diagnosis not present

## 2015-09-24 DIAGNOSIS — R42 Dizziness and giddiness: Secondary | ICD-10-CM | POA: Diagnosis not present

## 2015-09-24 DIAGNOSIS — E871 Hypo-osmolality and hyponatremia: Secondary | ICD-10-CM

## 2015-09-24 LAB — CBC WITH DIFFERENTIAL/PLATELET
Basophils Absolute: 0 10*3/uL (ref 0.0–0.1)
Basophils Relative: 0.5 % (ref 0.0–3.0)
Eosinophils Absolute: 0.3 10*3/uL (ref 0.0–0.7)
Eosinophils Relative: 4.8 % (ref 0.0–5.0)
HCT: 30.8 % — ABNORMAL LOW (ref 36.0–46.0)
Hemoglobin: 10.1 g/dL — ABNORMAL LOW (ref 12.0–15.0)
Lymphocytes Relative: 26.8 % (ref 12.0–46.0)
Lymphs Abs: 1.9 10*3/uL (ref 0.7–4.0)
MCHC: 32.9 g/dL (ref 30.0–36.0)
MCV: 83.1 fl (ref 78.0–100.0)
Monocytes Absolute: 0.5 10*3/uL (ref 0.1–1.0)
Monocytes Relative: 7.7 % (ref 3.0–12.0)
Neutro Abs: 4.2 10*3/uL (ref 1.4–7.7)
Neutrophils Relative %: 60.2 % (ref 43.0–77.0)
Platelets: 205 10*3/uL (ref 150.0–400.0)
RBC: 3.7 Mil/uL — ABNORMAL LOW (ref 3.87–5.11)
RDW: 18 % — ABNORMAL HIGH (ref 11.5–15.5)
WBC: 6.9 10*3/uL (ref 4.0–10.5)

## 2015-09-24 LAB — COMPREHENSIVE METABOLIC PANEL
ALT: 24 U/L (ref 0–35)
AST: 34 U/L (ref 0–37)
Albumin: 4 g/dL (ref 3.5–5.2)
Alkaline Phosphatase: 47 U/L (ref 39–117)
BUN: 9 mg/dL (ref 6–23)
CO2: 25 mEq/L (ref 19–32)
Calcium: 9.1 mg/dL (ref 8.4–10.5)
Chloride: 98 mEq/L (ref 96–112)
Creatinine, Ser: 0.64 mg/dL (ref 0.40–1.20)
GFR: 95.28 mL/min (ref >60.00)
Glucose, Bld: 98 mg/dL (ref 70–99)
Potassium: 5.2 mEq/L — ABNORMAL HIGH (ref 3.5–5.1)
Sodium: 130 mEq/L — ABNORMAL LOW (ref 135–145)
Total Bilirubin: 0.7 mg/dL (ref 0.2–1.2)
Total Protein: 7.1 g/dL (ref 6.0–8.3)

## 2015-09-24 MED ORDER — POTASSIUM CHLORIDE CRYS ER 20 MEQ PO TBCR: EXTENDED_RELEASE_TABLET | ORAL | Status: DC

## 2015-09-24 NOTE — Progress Notes (Signed)
Patient ID: Amy Morton, female    DOB: [birth year]  Age: 81 y.o. MRN: [id number]  CC: Hospitalization Follow-up and Dizziness   HPI Amy Morton presents for ED follow up for fall.   1) Pt was seen in the ED on 2.23.17 at [facility]  CC Head injury and fall  Pt reports losing balance from a stool, denied LOC, but hit back of head.   EKG- no acute findings  CT head and cervical spine: no acute concerns, atrophy w/ chronic small vessel white matter ischemic disease; degenerative changes in the cervical spine w/ out fracture.   Labs- Low sodium/cl, HCT, Hgb   Increased neutrophils, WBC, Glucose  Today:  Still having dizzy spell she reports intermittently when looking up, resolves after a few seconds- she reports she has just stopped reaching above her head  Took one meclizine on Thursday- made her feel "crazy"  Denies needing Nausea medicine  HA's every few days, takes tylenol and it helps  Denies trouble reading or watching TV.   108/64 retake BP   History Amy Morton has a past medical history of Anxiety; Arthritis; Depression; GERD (gastroesophageal reflux disease); Hyperlipidemia; Hypertension; Gastric ulcer; CAD (coronary artery disease); History of colonic polyps; Vitamin D deficiency; Abnormal mammogram (2007); Shingles (2008); Elevated LFTs (2010); Aortic stenosis; Anemia; Valvular heart disease; Asthma; aortic stenosis; and Peritoneal carcinomatosis ([place]) (2001).   She has past surgical history that includes Tonsillectomy (1950); Cholecystectomy (1987); Colectomy (2001); Laparoscopic bilateral salpingo oophorectomy (1988); Appendectomy; Abdominal hysterectomy (2001); Cardiac catheterization (2007); Cardiac catheterization (N/A, 04/27/2015); Cardiac catheterization (N/A, 04/27/2015); and Coronary angioplasty (1996).   Her family history includes Cancer in her father; Cancer (age of onset: 65) in her daughter; Diabetes in her brother and sister; Heart disease in her brother;  Hypertension in her mother; Stroke (age of onset: 23) in her mother. There is no history of Colon cancer or Colon polyps.She reports that she has never smoked. She has never used smokeless tobacco. She reports that she does not drink alcohol or use illicit drugs.  Outpatient Prescriptions Prior to Visit  Medication Sig Dispense Refill  . acetaminophen (TYLENOL) 650 MG CR tablet Take 650 mg by mouth as needed for pain.     Amy Kitchen albuterol (PROVENTIL HFA;VENTOLIN HFA) 108 (90 BASE) MCG/ACT inhaler Inhale 2 puffs into the lungs every 4 (four) hours as needed for wheezing or shortness of breath.     Amy Kitchen aspirin 81 MG tablet Take 81 mg by mouth daily.    Amy Kitchen atorvastatin (LIPITOR) 80 MG tablet Take 1 tablet (80 mg total) by mouth daily at 6 PM. 30 tablet 11  . busPIRone (BUSPAR) 5 MG tablet Take 1 tablet (5 mg total) by mouth 2 (two) times daily. 180 tablet 3  . calcium gluconate 650 MG tablet Take 650 mg by mouth 2 (two) times daily.    . Cholecalciferol ([employer] VIT D 5000 HIGH-POTENCY) 5000 UNITS capsule One capsule every other day (Patient taking differently: Take 5,000 Units by mouth every other day. One capsule every other day)    . citalopram (CELEXA) 20 MG tablet Take 1 tablet daily (Patient taking differently: Take 20 mg by mouth daily. ) 90 tablet 3  . clopidogrel (PLAVIX) 75 MG tablet Take 1 tablet (75 mg total) by mouth daily with breakfast. 30 tablet 11  . doxycycline (VIBRA-TABS) 100 MG tablet Take 1 tablet (100 mg total) by mouth 2 (two) times daily. 14 tablet 0  . Fluticasone-Salmeterol (ADVAIR) 250-50 MCG/DOSE AEPB Inhale 1 puff  into the lungs as needed (for shortness of breath).     . isosorbide mononitrate (IMDUR) 30 MG 24 hr tablet Take 1 tablet (30 mg total) by mouth daily. 30 tablet 6  . meclizine (ANTIVERT) 25 MG tablet Take 1 tablet (25 mg total) by mouth 3 (three) times daily as needed for dizziness or nausea. 20 tablet 0  . metoprolol (LOPRESSOR) 50 MG tablet Take 1 tablet (50 mg total) by  mouth 2 (two) times daily. 180 tablet 3  . mirtazapine (REMERON) 7.5 MG tablet Take 1 tablet (7.5 mg total) by mouth at bedtime. 90 tablet 3  . Multiple Vitamin (MULTIVITAMIN) capsule Take 1 capsule by mouth daily.    . nitroGLYCERIN (NITROSTAT) 0.4 MG SL tablet PLACE 1 TABLET UNDER THE TONGUE EVERY 5 MINUTES AS NEEDED FOR CHEST PAIN 25 tablet 1  . omega-3 acid ethyl esters (LOVAZA) 1 G capsule Take 2 capsules (2 g total) by mouth 2 (two) times daily. 360 capsule 3  . omeprazole (PRILOSEC) 40 MG capsule Take 1 capsule (40 mg total) by mouth daily. 30 capsule 3  . ondansetron (ZOFRAN ODT) 4 MG disintegrating tablet Take 1 tablet (4 mg total) by mouth every 8 (eight) hours as needed for nausea or vomiting. 20 tablet 0  . Probiotic Product (FLORAJEN BIFIDOBLEND) CAPS Take 1 capsule by mouth daily. 90 capsule 3  . vitamin B-12 (CYANOCOBALAMIN) 250 MCG tablet Take 250 mcg by mouth daily.    . potassium chloride SA (K-DUR,KLOR-CON) 20 MEQ tablet TAKE 1 BY MOUTH DAILY --Levell Morton-- 90 tablet 0   No facility-administered medications prior to visit.    ROS Review of Systems  Constitutional: Negative for fever, chills, diaphoresis and fatigue.  Respiratory: Negative for chest tightness, shortness of breath and wheezing.   Cardiovascular: Negative for chest pain, palpitations and leg swelling.  Gastrointestinal: Negative for nausea, vomiting and diarrhea.  Skin: Negative for rash.  Neurological: Positive for dizziness and headaches. Negative for weakness and numbness.  Psychiatric/Behavioral: The patient is not nervous/anxious.     Objective:  BP 108/64 mmHg  Pulse 70  Temp(Src) 98.3 F (36.8 C) (Oral)  Resp 14  Ht 4\' 11"  (1.499 m)  Wt 140 lb (63.504 kg)  BMI 28.26 kg/m2  SpO2 96%  Physical Exam  Constitutional: She is oriented to person, place, and time. She appears well-developed and well-nourished. No distress.  HENT:  Head: Normocephalic and atraumatic.  Right Ear: External ear  normal.  Left Ear: External ear normal.  Eyes: EOM are normal. Pupils are equal, round, and reactive to light. Right eye exhibits no discharge. Left eye exhibits no discharge. No scleral icterus.  Cardiovascular: Normal rate, regular rhythm and normal heart sounds.  Exam reveals no gallop and no friction rub.   No murmur heard. Pulmonary/Chest: Effort normal and breath sounds normal. No respiratory distress. She has no wheezes. She has no rales. She exhibits no tenderness.  Neurological: She is alert and oriented to person, place, and time. No cranial nerve deficit. She exhibits normal muscle tone. Coordination normal.  Skin: Skin is warm and dry. No rash noted. She is not diaphoretic.  Psychiatric: She has a normal mood and affect. Her behavior is normal. Judgment and thought content normal.    Assessment & Plan:   Amy Morton was seen today for hospitalization follow-up and dizziness.  Diagnoses and all orders for this visit:  Hyponatremia -     CBC with Differential/Platelet -     Comprehensive metabolic panel  Dizziness -  CBC with Differential/Platelet -     Comprehensive metabolic panel  Other orders -     potassium chloride SA (K-DUR,KLOR-CON) 20 MEQ tablet; TAKE 1 BY MOUTH DAILY   I have changed Amy Morton's potassium chloride SA. I am also having her maintain her aspirin, calcium gluconate, multivitamin, acetaminophen, Cholecalciferol, vitamin B-12, FLORAJEN BIFIDOBLEND, Fluticasone-Salmeterol, albuterol, omeprazole, busPIRone, metoprolol, citalopram, omega-3 acid ethyl esters, clopidogrel, atorvastatin, isosorbide mononitrate, mirtazapine, nitroGLYCERIN, doxycycline, meclizine, and ondansetron.  Meds ordered this encounter  Medications  . potassium chloride SA (K-DUR,KLOR-CON) 20 MEQ tablet    Sig: TAKE 1 BY MOUTH DAILY    Dispense:  90 tablet    Refill:  0    Order Specific Question:  Supervising Provider    Answer:  Crecencio Mc [[id number]]     Follow-up: Return  if symptoms worsen or fail to improve.

## 2015-09-24 NOTE — Patient Instructions (Signed)
Please visit the lab before leaving today.  

## 2015-09-25 ENCOUNTER — Other Ambulatory Visit: Payer: Self-pay | Admitting: Cardiovascular Disease

## 2015-09-30 DIAGNOSIS — R42 Dizziness and giddiness: Secondary | ICD-10-CM | POA: Insufficient documentation

## 2015-09-30 DIAGNOSIS — E871 Hypo-osmolality and hyponatremia: Secondary | ICD-10-CM | POA: Insufficient documentation

## 2015-09-30 NOTE — Assessment & Plan Note (Signed)
Established problem Repeating a C met today

## 2015-09-30 NOTE — Assessment & Plan Note (Signed)
Improving since ED follow-up Patient reports that she doesn't look up or return of her head she does not have this issue She is declining physical therapy  She will continue to watch closely and return to care if worsening or becomes more frequent

## 2015-10-01 ENCOUNTER — Other Ambulatory Visit: Payer: Self-pay | Admitting: Nurse Practitioner

## 2015-10-01 DIAGNOSIS — E871 Hypo-osmolality and hyponatremia: Secondary | ICD-10-CM

## 2015-10-05 ENCOUNTER — Other Ambulatory Visit (INDEPENDENT_AMBULATORY_CARE_PROVIDER_SITE_OTHER): Payer: Medicare Other

## 2015-10-05 DIAGNOSIS — E871 Hypo-osmolality and hyponatremia: Secondary | ICD-10-CM | POA: Diagnosis not present

## 2015-10-05 LAB — BASIC METABOLIC PANEL
BUN: 10 mg/dL (ref 6–23)
CO2: 25 mEq/L (ref 19–32)
Calcium: 9.4 mg/dL (ref 8.4–10.5)
Chloride: 97 mEq/L (ref 96–112)
Creatinine, Ser: 0.65 mg/dL (ref 0.40–1.20)
GFR: 93.58 mL/min (ref >60.00)
Glucose, Bld: 99 mg/dL (ref 70–99)
Potassium: 4.6 mEq/L (ref 3.5–5.1)
Sodium: 132 mEq/L — ABNORMAL LOW (ref 135–145)

## 2015-10-17 ENCOUNTER — Other Ambulatory Visit: Payer: Self-pay | Admitting: Cardiology

## 2015-10-26 ENCOUNTER — Other Ambulatory Visit: Payer: Self-pay | Admitting: Nurse Practitioner

## 2015-10-26 NOTE — Telephone Encounter (Signed)
Last refilled by another provider. Please advise?

## 2015-10-30 DIAGNOSIS — Z85828 Personal history of other malignant neoplasm of skin: Secondary | ICD-10-CM | POA: Diagnosis not present

## 2015-10-30 DIAGNOSIS — D1800 Hemangioma unspecified site: Secondary | ICD-10-CM | POA: Diagnosis not present

## 2015-10-30 DIAGNOSIS — L814 Other melanin hyperpigmentation: Secondary | ICD-10-CM | POA: Diagnosis not present

## 2015-10-30 DIAGNOSIS — L820 Inflamed seborrheic keratosis: Secondary | ICD-10-CM | POA: Diagnosis not present

## 2015-10-30 DIAGNOSIS — D229 Melanocytic nevi, unspecified: Secondary | ICD-10-CM | POA: Diagnosis not present

## 2015-10-30 DIAGNOSIS — D692 Other nonthrombocytopenic purpura: Secondary | ICD-10-CM | POA: Diagnosis not present

## 2015-10-30 DIAGNOSIS — L300 Nummular dermatitis: Secondary | ICD-10-CM | POA: Diagnosis not present

## 2015-10-30 DIAGNOSIS — L578 Other skin changes due to chronic exposure to nonionizing radiation: Secondary | ICD-10-CM | POA: Diagnosis not present

## 2015-10-30 DIAGNOSIS — L821 Other seborrheic keratosis: Secondary | ICD-10-CM | POA: Diagnosis not present

## 2015-10-30 DIAGNOSIS — L570 Actinic keratosis: Secondary | ICD-10-CM | POA: Diagnosis not present

## 2015-10-30 DIAGNOSIS — Z1283 Encounter for screening for malignant neoplasm of skin: Secondary | ICD-10-CM | POA: Diagnosis not present

## 2015-11-08 ENCOUNTER — Other Ambulatory Visit: Payer: Self-pay

## 2015-11-08 MED ORDER — NITROGLYCERIN 0.4 MG SL SUBL: SUBLINGUAL_TABLET | SUBLINGUAL | Status: DC

## 2015-11-08 NOTE — Telephone Encounter (Signed)
Refill sent for NTG  

## 2015-11-12 ENCOUNTER — Ambulatory Visit (INDEPENDENT_AMBULATORY_CARE_PROVIDER_SITE_OTHER): Payer: Medicare Other | Admitting: Cardiovascular Disease

## 2015-11-12 ENCOUNTER — Encounter: Payer: Self-pay | Admitting: Cardiovascular Disease

## 2015-11-12 VITALS — BP 110/74 | HR 78 | Ht 59.0 in | Wt 136.2 lb

## 2015-11-12 DIAGNOSIS — I2510 Atherosclerotic heart disease of native coronary artery without angina pectoris: Secondary | ICD-10-CM

## 2015-11-12 DIAGNOSIS — Z9861 Coronary angioplasty status: Secondary | ICD-10-CM

## 2015-11-12 DIAGNOSIS — I200 Unstable angina: Secondary | ICD-10-CM | POA: Diagnosis not present

## 2015-11-12 MED ORDER — PANTOPRAZOLE SODIUM 40 MG PO TBEC: 40.0000 mg | DELAYED_RELEASE_TABLET | Freq: Every day | ORAL | Status: DC

## 2015-11-12 NOTE — Progress Notes (Signed)
Cardiology Office Note   Date:  11/12/2015   ID:  Amy Morton, DOB 1934/11/14, MRN [id number]  PCP:  Rubbie Battiest, NP  Cardiologist:   Thayer Headings, MD   No chief complaint on file.  1. CAD - s/p PTCA - 1996. S/p PCI of her LCX Sept. 2016 2. Hyperlipidemia 3. Anxiety/depression 4.  History of Present Illness:  Amy Morton is a 81 yo with hx of CAD - s/p PCI in 1996 [name])  She has been seeing Dr. Rosalita Levan. Wanted to changed cardiologist.  She was recently prescribed Abilify and had a bad reaction. She stated that it made her feel quite bad ( chest burning) She has stopped the medication and now feels quite a bit better. She was seen in the ER and had marked HTN. BP is now better.   She walks daily.  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 28, 2016 Amy Morton is a 81 y.o. female who presents for CAD and hyperlipidemia. She has been having exertional CP for 3-4 weeks. Tightness, pressure, mid sternal , radiates out across her chest.  No radiation down arm . Associated with increased dyspnea. Does not have any NTG Has been limiting her exertional activites.  Has trouble getting her mail . Feels very similar to her previous episodes of angina prior to PCI.   Oct. 10, 2016:  Amy Morton is seen back today for her coronary artery disease. I saw her on September 28 with symptoms of unstable angina. She had a cardiac catheterization by Dr. Irish Lack and was found to have chronic total occlusion of her right coronary artery. The mid LAD had a 70% stenosis. The left circumflex artery had a tight stenosis and had stents placed. The mid LAD stenosis is difficult to approach because of a very sharp bend. She is basically pain free. Has had some sharp pains that woke her from sleep , took a SL NTG and the pain resolved.  Getting regular exercise - walking regualry   November 12, 2015:  She fell in February and had a consussion.    Still having problems with that.  Vertigo issues.  Also has orthostatic hypotension   Doing well from a cardiac standpoint .     Past Medical History  Diagnosis Date  . Anxiety   . Arthritis   . Depression   . GERD (gastroesophageal reflux disease)   . Hyperlipidemia   . Hypertension   . Gastric ulcer   . CAD (coronary artery disease)     s/p angioplasty 1996  . History of colonic polyps     Dr. Vira Agar  . Vitamin D deficiency   . Abnormal mammogram 2007    Recommend close follow up  . Shingles 2008    T-9 distribution  . Elevated LFTs 2010    s/p ultrasound w/ possible fatty liver; GI consult. Resolution 2011  . Aortic stenosis     mild to moderate  . Anemia     Iron, b12, SPEP, UPEP normal, 12/2012  . Valvular heart disease     Mild to Moderate MR, AS  . Asthma   . aortic stenosis   . Peritoneal carcinomatosis [facility]) 2001    Dr. Oliva Bustard & Dr. Claiborne Rigg s/p chemo    Past Surgical History  Procedure Laterality Date  . Tonsillectomy  1950  . Cholecystectomy  1987  . Colectomy  2001  . Laparoscopic bilateral salpingo oopherectomy  1988    Dr. Laverta Baltimore  . Appendectomy    . Abdominal hysterectomy  2001  . Cardiac catheterization  2007    50% mid LAD stenosis, 70% proximal RCA with 100% distal RCA stenosis with collaterals, EF 65%  . Cardiac catheterization N/A 04/27/2015    Procedure: Left Heart Cath and Coronary Angiography;  Surgeon: Jettie Booze, MD;  Location: [place] [facility];  Service: Cardiovascular;  Laterality: N/A;  . Cardiac catheterization N/A 04/27/2015    Procedure: Coronary Stent Intervention;  Surgeon: Jettie Booze, MD;  Location: [place] [facility];  Service: Cardiovascular;  Laterality: N/A;  . Coronary angioplasty  1996     Current Outpatient Prescriptions  Medication Sig Dispense Refill  . acetaminophen (TYLENOL) 650 MG CR tablet Take 650 mg by mouth as needed for pain.     Marland Kitchen albuterol (PROVENTIL HFA;VENTOLIN HFA) 108 (90 BASE)  MCG/ACT inhaler Inhale 2 puffs into the lungs every 4 (four) hours as needed for wheezing or shortness of breath.     Marland Kitchen aspirin 81 MG tablet Take 81 mg by mouth daily.    Marland Kitchen atorvastatin (LIPITOR) 80 MG tablet Take 1 tablet (80 mg total) by mouth daily at 6 PM. 30 tablet 11  . busPIRone (BUSPAR) 5 MG tablet Take 1 tablet (5 mg total) by mouth 2 (two) times daily. 180 tablet 3  . calcium gluconate 650 MG tablet Take 650 mg by mouth 2 (two) times daily.    . Cholecalciferol (CVS VIT D 5000 HIGH-POTENCY) 5000 UNITS capsule One capsule every other day (Patient taking differently: Take 5,000 Units by mouth every other day. One capsule every other day)    . citalopram (CELEXA) 20 MG tablet Take 1 tablet daily (Patient taking differently: Take 20 mg by mouth daily. ) 90 tablet 3  . clopidogrel (PLAVIX) 75 MG tablet Take 1 tablet (75 mg total) by mouth daily with breakfast. 30 tablet 11  . Fluticasone-Salmeterol (ADVAIR) 250-50 MCG/DOSE AEPB Inhale 1 puff into the lungs as needed (for shortness of breath).     . isosorbide mononitrate (IMDUR) 30 MG 24 hr tablet TAKE 1 TABLET(30 MG) BY MOUTH DAILY 30 tablet 3  . metoprolol (LOPRESSOR) 50 MG tablet Take 1 tablet (50 mg total) by mouth 2 (two) times daily. 180 tablet 3  . mirtazapine (REMERON) 7.5 MG tablet Take 1 tablet (7.5 mg total) by mouth at bedtime. 90 tablet 3  . Multiple Vitamin (MULTIVITAMIN) capsule Take 1 capsule by mouth daily.    . nitroGLYCERIN (NITROSTAT) 0.4 MG SL tablet PLACE 1 TABLET UNDER THE TONGUE EVERY 5 MINUTES AS NEEDED FOR CHEST PAIN 25 tablet 1  . nitroGLYCERIN (NITROSTAT) 0.4 MG SL tablet PLACE 1 TABLET UNDER THE TONGUE EVERY 5 MINUTES AS NEEDED FOR CHEST PAIN 25 tablet 1  . omega-3 acid ethyl esters (LOVAZA) 1 G capsule Take 2 capsules (2 g total) by mouth 2 (two) times daily. 360 capsule 3  . omeprazole (PRILOSEC) 40 MG capsule Take 1 capsule (40 mg total) by mouth daily. 30 capsule 3  . ondansetron (ZOFRAN ODT) 4 MG disintegrating  tablet Take 1 tablet (4 mg total) by mouth every 8 (eight) hours as needed for nausea or vomiting. 20 tablet 0  . potassium chloride SA (K-DUR,KLOR-CON) 20 MEQ tablet TAKE 1 BY MOUTH DAILY 90 tablet 0  . Probiotic Product (FLORAJEN BIFIDOBLEND) CAPS Take 1 capsule by mouth daily. 90 capsule 3  . vitamin B-12 (CYANOCOBALAMIN) 250 MCG tablet Take 250 mcg  by mouth daily.     No current facility-administered medications for this visit.    Allergies:   Abilify    Social History:  The patient  reports that she has never smoked. She has never used smokeless tobacco. She reports that she does not drink alcohol or use illicit drugs.   Family History:  The patient's family history includes Cancer in her father; Cancer (age of onset: 49) in her daughter; Diabetes in her brother and sister; Heart disease in her brother; Hypertension in her mother; Stroke (age of onset: 70) in her mother. There is no history of Colon cancer or Colon polyps.    ROS:  Please see the history of present illness.    Review of Systems: Constitutional:  denies fever, chills, diaphoresis, appetite change and fatigue.  HEENT: denies photophobia, eye pain, redness, hearing loss, ear pain, congestion, sore throat, rhinorrhea, sneezing, neck pain, neck stiffness and tinnitus.  Respiratory: denies SOB, DOE, cough, chest tightness, and wheezing.  Cardiovascular: denies chest pain, palpitations and leg swelling.  Gastrointestinal: denies nausea, vomiting, abdominal pain, diarrhea, constipation, blood in stool.  Genitourinary: denies dysuria, urgency, frequency, hematuria, flank pain and difficulty urinating.  Musculoskeletal: denies  myalgias, back pain, joint swelling, arthralgias and gait problem.   Skin: denies pallor, rash and wound.  Neurological: denies dizziness, seizures, syncope, weakness, light-headedness, numbness and headaches.   Hematological: denies adenopathy, easy bruising, personal or family bleeding history.    Psychiatric/ Behavioral: denies suicidal ideation, mood changes, confusion, nervousness, sleep disturbance and agitation.       All other systems are reviewed and negative.    PHYSICAL EXAM: VS:  BP 110/74 mmHg  Pulse 78  Ht 4\' 11"  (1.499 m)  Wt 136 lb 3.2 oz (61.78 kg)  BMI 27.49 kg/m2 , BMI Body mass index is 27.49 kg/(m^2). GEN: Well nourished, well developed, in no acute distress HEENT: normal Neck: no JVD, carotid bruits, or masses Cardiac: RRR; 1-2 / 6 systolic murmur, rubs, or gallops,no edema  Respiratory:  clear to auscultation bilaterally, normal work of breathing GI: soft, nontender, nondistended, + BS MS:  Large bruise on right forearm Skin: warm and dry, no rash Neuro:  Strength and sensation are intact Psych: normal   EKG:  EKG is ordered today. The ekg ordered today demonstrates normal sinus rhythm at 61. She has no ST or T wave changes. The EKG is normal.   Recent Labs: 08/23/2015: Pro B Natriuretic peptide (BNP) 164.0* 09/24/2015: ALT 24; Hemoglobin 10.1*; Platelets 205.0 10/05/2015: BUN 10; Creatinine, Ser 0.65; Potassium 4.6; Sodium 132*    Lipid Panel    Component Value Date/Time   CHOL 148 10/19/2014 1212   CHOL 144 05/10/2014 0851   TRIG 193* 10/19/2014 1212   HDL 48 10/19/2014 1212   HDL 40.20 05/10/2014 0851   CHOLHDL 3.1 10/19/2014 1212   CHOLHDL 4 05/10/2014 0851   VLDL 15.8 05/10/2014 0851   LDLCALC 61 10/19/2014 1212   LDLCALC 88 05/10/2014 0851      Wt Readings from Last 3 Encounters:  11/12/15 136 lb 3.2 oz (61.78 kg)  09/24/15 140 lb (63.504 kg)  09/19/15 135 lb (61.236 kg)      Other studies Reviewed: Additional studies/ records that were reviewed today include:  Review of the above records demonstrates:    ASSESSMENT AND PLAN:  1. Unstable angina  - s/p PTCA - 1996 - unit is doing much better. She status post PCI of her left circumflex artery.  Dist RCA lesion,  100% stenosed. Known from 2012 catheterization. Short  occlusion but heavily calcified. There is a stump if intervention was to be attempted.  Focal Mid LAD-2 lesion, 70% stenosed just after a diagonal at a tortuos segment. The lesion was not previously treated.  Mid Cx lesion, 70% stenosed. There is a 0% residual stenosis post intervention with overlapping DES , Synergy 3.0 x 12 and Synergy 2.5 x 8, all postdilated with a 3.25 balloon. The lesion was not previously treated.  She is on Omeprazole and plavix. Will DC the omeprazol and start Protonix 40 mg a day for the next 6 months. I anticipate having her restart the omeprazole after she has completed her plavix therapy   2. Hyperlipidemia -we have stopped her lovastatin. We have started her on atorvastatin. We'll check fasting labs in 6 months.    3. Anxiety/depression followed by her general medical doctor.   Current medicines are reviewed at length with the patient today.  The patient does not have concerns regarding medicines.  The following changes have been made:  no change  Labs/ tests ordered today include:  Orders Placed This Encounter  Procedures  . EKG 12-Lead     Disposition:   FU with me in 6 months     Signed, Ina Poupard, Wonda Cheng, MD  11/12/2015 2:24 PM    [address], [place], [place]  [address] Phone: [phone]; Fax: [phone]

## 2015-11-12 NOTE — Patient Instructions (Signed)
Medication Instructions:  STOP Kdur (potassium supplement) STOP Prilosec (Omeprazole) - it interferes with Plavix START Protonix 40 mg once daily   Labwork: Your physician recommends that you return for lab work in: 3 weeks for basic metabolic panel   Your physician recommends that you return for lab work in: 6 months on the day of or a few days before your office visit with Dr. Acie Fredrickson.  You will need to FAST for this appointment - nothing to eat or drink after midnight the night before except water.   Testing/Procedures: None Ordered   Follow-Up: Your physician wants you to follow-up in: 6 months with Dr. Acie Fredrickson.  You will receive a reminder letter in the mail two months in advance. If you don't receive a letter, please call our office to schedule the follow-up appointment.   If you need a refill on your cardiac medications before your next appointment, please call your pharmacy.   Thank you for choosing [facility]! Christen Bame, RN [phone]

## 2015-11-21 ENCOUNTER — Encounter: Payer: Self-pay | Admitting: Nurse Practitioner

## 2015-11-21 ENCOUNTER — Ambulatory Visit (INDEPENDENT_AMBULATORY_CARE_PROVIDER_SITE_OTHER): Payer: Medicare Other | Admitting: Nurse Practitioner

## 2015-11-21 VITALS — BP 134/90 | HR 67 | Temp 97.6°F | Ht 59.0 in | Wt 137.4 lb

## 2015-11-21 DIAGNOSIS — R42 Dizziness and giddiness: Secondary | ICD-10-CM

## 2015-11-21 DIAGNOSIS — E871 Hypo-osmolality and hyponatremia: Secondary | ICD-10-CM | POA: Diagnosis not present

## 2015-11-21 DIAGNOSIS — R062 Wheezing: Secondary | ICD-10-CM

## 2015-11-21 DIAGNOSIS — J449 Chronic obstructive pulmonary disease, unspecified: Secondary | ICD-10-CM | POA: Insufficient documentation

## 2015-11-21 DIAGNOSIS — I200 Unstable angina: Secondary | ICD-10-CM | POA: Diagnosis not present

## 2015-11-21 MED ORDER — FLUTICASONE-SALMETEROL 250-50 MCG/DOSE IN AEPB: 1.0000 | INHALATION_SPRAY | RESPIRATORY_TRACT | Status: DC | PRN

## 2015-11-21 NOTE — Patient Instructions (Signed)
Try a half of a benadryl if dizzy or try 1 at night to see if this helps with the room spinning when changing positions.   See you in 6 months!

## 2015-11-21 NOTE — Assessment & Plan Note (Signed)
Wheezing today- low on Advair, using albuterol prn

## 2015-11-21 NOTE — Assessment & Plan Note (Signed)
Checking her BMET at [facility] on next monday

## 2015-11-21 NOTE — Progress Notes (Signed)
Patient ID: Amy Morton, female    DOB: 07/20/1935  Age: 81 y.o. MRN: [id number]  CC: Follow-up   HPI Amy Morton presents for follow up dizziness.   1) Last visit on 09/24/15 was for dizziness and hyponatremia. She wanted to wait and watch. She did have a fall at home, and was doing well at the time of the last visit.   She was seen by Dr. Acie Fredrickson and was found to be okay by a cardiac standpoint and her vertigo/orthostatic hypotension still occuring.   Improving- "not as bad", but happens with lying down or sitting up from lying position and reports room spinning for a few minutes. It resolves and she moves on. She is moving from her current home to a smaller town home soon and is looking forward to this as well as less to keep up with.   History Gwenn has a past medical history of Anxiety; Arthritis; Depression; GERD (gastroesophageal reflux disease); Hyperlipidemia; Hypertension; Gastric ulcer; CAD (coronary artery disease); History of colonic polyps; Vitamin D deficiency; Abnormal mammogram (2007); Shingles (2008); Elevated LFTs (2010); Aortic stenosis; Anemia; Valvular heart disease; Asthma; aortic stenosis; and Peritoneal carcinomatosis ([place]) (2001).   She has past surgical history that includes Tonsillectomy (1950); Cholecystectomy (1987); Colectomy (2001); Laparoscopic bilateral salpingo oophorectomy (1988); Appendectomy; Abdominal hysterectomy (2001); Cardiac catheterization (2007); Cardiac catheterization (N/A, 04/27/2015); Cardiac catheterization (N/A, 04/27/2015); and Coronary angioplasty (1996).   Her family history includes Cancer in her father; Cancer (age of onset: 31) in her daughter; Diabetes in her brother and sister; Heart disease in her brother; Hypertension in her mother; Stroke (age of onset: 37) in her mother. There is no history of Colon cancer or Colon polyps.She reports that she has never smoked. She has never used smokeless tobacco. She reports that she does  not drink alcohol or use illicit drugs.  Outpatient Prescriptions Prior to Visit  Medication Sig Dispense Refill  . acetaminophen (TYLENOL) 650 MG CR tablet Take 650 mg by mouth as needed for pain.     Marland Kitchen albuterol (PROVENTIL HFA;VENTOLIN HFA) 108 (90 BASE) MCG/ACT inhaler Inhale 2 puffs into the lungs every 4 (four) hours as needed for wheezing or shortness of breath.     Marland Kitchen aspirin 81 MG tablet Take 81 mg by mouth daily.    Marland Kitchen atorvastatin (LIPITOR) 80 MG tablet Take 1 tablet (80 mg total) by mouth daily at 6 PM. 30 tablet 11  . busPIRone (BUSPAR) 5 MG tablet Take 1 tablet (5 mg total) by mouth 2 (two) times daily. 180 tablet 3  . calcium gluconate 650 MG tablet Take 650 mg by mouth 2 (two) times daily.    . Cholecalciferol ([employer]S VIT D 5000 HIGH-POTENCY) 5000 UNITS capsule One capsule every other day (Patient taking differently: Take 5,000 Units by mouth every other day. One capsule every other day)    . citalopram (CELEXA) 20 MG tablet Take 1 tablet daily (Patient taking differently: Take 20 mg by mouth daily. ) 90 tablet 3  . clopidogrel (PLAVIX) 75 MG tablet Take 1 tablet (75 mg total) by mouth daily with breakfast. 30 tablet 11  . isosorbide mononitrate (IMDUR) 30 MG 24 hr tablet TAKE 1 TABLET(30 MG) BY MOUTH DAILY 30 tablet 3  . metoprolol (LOPRESSOR) 50 MG tablet Take 1 tablet (50 mg total) by mouth 2 (two) times daily. 180 tablet 3  . mirtazapine (REMERON) 7.5 MG tablet Take 1 tablet (7.5 mg total) by mouth at bedtime. 90 tablet 3  .  Multiple Vitamin (MULTIVITAMIN) capsule Take 1 capsule by mouth daily.    . nitroGLYCERIN (NITROSTAT) 0.4 MG SL tablet PLACE 1 TABLET UNDER THE TONGUE EVERY 5 MINUTES AS NEEDED FOR CHEST PAIN 25 tablet 1  . omega-3 acid ethyl esters (LOVAZA) 1 G capsule Take 2 capsules (2 g total) by mouth 2 (two) times daily. 360 capsule 3  . ondansetron (ZOFRAN ODT) 4 MG disintegrating tablet Take 1 tablet (4 mg total) by mouth every 8 (eight) hours as needed for nausea or  vomiting. 20 tablet 0  . pantoprazole (PROTONIX) 40 MG tablet Take 1 tablet (40 mg total) by mouth daily. 30 tablet 6  . Probiotic Product (FLORAJEN BIFIDOBLEND) CAPS Take 1 capsule by mouth daily. 90 capsule 3  . vitamin B-12 (CYANOCOBALAMIN) 250 MCG tablet Take 250 mcg by mouth daily.    . Fluticasone-Salmeterol (ADVAIR) 250-50 MCG/DOSE AEPB Inhale 1 puff into the lungs as needed (for shortness of breath).     . nitroGLYCERIN (NITROSTAT) 0.4 MG SL tablet PLACE 1 TABLET UNDER THE TONGUE EVERY 5 MINUTES AS NEEDED FOR CHEST PAIN 25 tablet 1   No facility-administered medications prior to visit.    ROS Review of Systems  Constitutional: Negative for fever, chills, diaphoresis and fatigue.  Respiratory: Negative for chest tightness, shortness of breath and wheezing.   Cardiovascular: Negative for chest pain, palpitations and leg swelling.  Gastrointestinal: Negative for nausea, vomiting and diarrhea.  Skin: Negative for rash.  Neurological: Positive for dizziness. Negative for headaches.    Objective:  BP 134/90 mmHg  Pulse 67  Temp(Src) 97.6 F (36.4 C) (Oral)  Ht 4\' 11"  (1.499 m)  Wt 137 lb 6.4 oz (62.324 kg)  BMI 27.74 kg/m2  SpO2 96%  Physical Exam  Constitutional: She is oriented to person, place, and time. She appears well-developed and well-nourished. No distress.  HENT:  Head: Normocephalic and atraumatic.  Right Ear: External ear normal.  Left Ear: External ear normal.  Eyes: EOM are normal. Pupils are equal, round, and reactive to light. Right eye exhibits no discharge. Left eye exhibits no discharge. No scleral icterus.  Cardiovascular: Normal rate and regular rhythm.   Pulmonary/Chest: Effort normal. No respiratory distress. She has wheezes. She has no rales. She exhibits no tenderness.  Neurological: She is alert and oriented to person, place, and time.  Skin: Skin is warm and dry. No rash noted. She is not diaphoretic.  Psychiatric: She has a normal mood and  affect. Her behavior is normal. Judgment and thought content normal.   Assessment & Plan:   There are no diagnoses linked to this encounter. I am having Ms. Deerman maintain her aspirin, calcium gluconate, multivitamin, acetaminophen, Cholecalciferol, vitamin B-12, FLORAJEN BIFIDOBLEND, albuterol, busPIRone, metoprolol, citalopram, omega-3 acid ethyl esters, clopidogrel, atorvastatin, mirtazapine, nitroGLYCERIN, ondansetron, isosorbide mononitrate, pantoprazole, and Fluticasone-Salmeterol.  Meds ordered this encounter  Medications  . Fluticasone-Salmeterol (ADVAIR) 250-50 MCG/DOSE AEPB    Sig: Inhale 1 puff into the lungs as needed (for shortness of breath).    Dispense:  60 each    Refill:  0     Follow-up: Return in about 6 months (around 05/22/2016) for Follow up .

## 2015-11-21 NOTE — Assessment & Plan Note (Signed)
Dizziness- improving somewhat. Pt still declines PT due to having done a round in the past without relief. Benadryl 1/2 tablet for dizziness/allergies. FU prn worsening/failure to improve.

## 2015-12-03 ENCOUNTER — Other Ambulatory Visit (INDEPENDENT_AMBULATORY_CARE_PROVIDER_SITE_OTHER): Payer: Medicare Other | Admitting: *Deleted

## 2015-12-03 DIAGNOSIS — I2510 Atherosclerotic heart disease of native coronary artery without angina pectoris: Secondary | ICD-10-CM

## 2015-12-03 LAB — BASIC METABOLIC PANEL
BUN: 9 mg/dL (ref 7–25)
CO2: 26 mmol/L (ref 20–31)
Calcium: 9.8 mg/dL (ref 8.6–10.4)
Chloride: 101 mmol/L (ref 98–110)
Creat: 0.63 mg/dL (ref 0.60–0.93)
Glucose, Bld: 107 mg/dL — ABNORMAL HIGH (ref 65–99)
Potassium: 5 mmol/L (ref 3.5–5.3)
Sodium: 136 mmol/L (ref 135–146)

## 2015-12-03 NOTE — Addendum Note (Signed)
Addended by: Eulis Foster on: 12/03/2015 10:58 AM   Modules accepted: Orders

## 2015-12-11 ENCOUNTER — Other Ambulatory Visit: Payer: Self-pay

## 2015-12-11 MED ORDER — CITALOPRAM HYDROBROMIDE 20 MG PO TABS: ORAL_TABLET | ORAL | Status: DC

## 2015-12-11 MED ORDER — MIRTAZAPINE 7.5 MG PO TABS: 7.5000 mg | ORAL_TABLET | Freq: Every day | ORAL | Status: DC

## 2015-12-11 MED ORDER — OMEGA-3-ACID ETHYL ESTERS 1 G PO CAPS: 2.0000 g | ORAL_CAPSULE | Freq: Two times a day (BID) | ORAL | Status: DC

## 2015-12-11 NOTE — Addendum Note (Signed)
Addended by: Milta Deiters on: 12/11/2015 01:50 PM   Modules accepted: Orders

## 2015-12-17 DIAGNOSIS — M1711 Unilateral primary osteoarthritis, right knee: Secondary | ICD-10-CM | POA: Diagnosis not present

## 2015-12-17 DIAGNOSIS — S83231D Complex tear of medial meniscus, current injury, right knee, subsequent encounter: Secondary | ICD-10-CM | POA: Diagnosis not present

## 2015-12-26 ENCOUNTER — Other Ambulatory Visit: Payer: Self-pay | Admitting: Cardiovascular Disease

## 2015-12-26 ENCOUNTER — Other Ambulatory Visit: Payer: Self-pay | Admitting: *Deleted

## 2015-12-26 MED ORDER — NITROGLYCERIN 0.4 MG SL SUBL: SUBLINGUAL_TABLET | SUBLINGUAL | Status: DC

## 2016-02-05 ENCOUNTER — Other Ambulatory Visit: Payer: Self-pay | Admitting: *Deleted

## 2016-02-05 MED ORDER — ISOSORBIDE MONONITRATE ER 30 MG PO TB24: ORAL_TABLET | ORAL | Status: DC

## 2016-03-04 ENCOUNTER — Ambulatory Visit (INDEPENDENT_AMBULATORY_CARE_PROVIDER_SITE_OTHER): Payer: Medicare Other

## 2016-03-04 VITALS — BP 136/70 | HR 64 | Temp 97.4°F | Resp 14 | Ht 59.0 in | Wt 134.8 lb

## 2016-03-04 DIAGNOSIS — E2839 Other primary ovarian failure: Secondary | ICD-10-CM

## 2016-03-04 DIAGNOSIS — Z0000 Encounter for general adult medical examination without abnormal findings: Secondary | ICD-10-CM

## 2016-03-04 DIAGNOSIS — Z23 Encounter for immunization: Secondary | ICD-10-CM

## 2016-03-04 NOTE — Progress Notes (Signed)
Care was provided under my supervision. I agree with the management as indicated in the note.  Yulonda Wheeling DO  

## 2016-03-04 NOTE — Progress Notes (Signed)
Subjective:   Amy Morton is a 81 y.o. female who presents for an Initial Medicare Annual Wellness Visit.  Review of Systems    No ROS.  Medicare Wellness Visit.  Cardiac Risk Factors include: advanced age (>59men, >5 women);hypertension     Objective:    Today's Vitals   03/04/16 1326  BP: 136/70  Pulse: 64  Resp: 14  Temp: 97.4 F (36.3 C)  TempSrc: Oral  SpO2: 96%  Weight: 134 lb 12.8 oz (61.1 kg)  Height: 4\' 11"  (1.499 m)   Body mass index is 27.23 kg/m.   Current Medications (verified) Outpatient Encounter Prescriptions as of 03/04/2016  Medication Sig  . acetaminophen (TYLENOL) 650 MG CR tablet Take 650 mg by mouth as needed for pain.   Marland Kitchen albuterol (PROVENTIL HFA;VENTOLIN HFA) 108 (90 BASE) MCG/ACT inhaler Inhale 2 puffs into the lungs every 4 (four) hours as needed for wheezing or shortness of breath.   Marland Kitchen aspirin 81 MG tablet Take 81 mg by mouth daily.  Marland Kitchen atorvastatin (LIPITOR) 80 MG tablet Take 1 tablet (80 mg total) by mouth daily at 6 PM.  . busPIRone (BUSPAR) 5 MG tablet Take 1 tablet (5 mg total) by mouth 2 (two) times daily.  . calcium gluconate 650 MG tablet Take 650 mg by mouth 2 (two) times daily.  . Cholecalciferol (CVS VIT D 5000 HIGH-POTENCY) 5000 UNITS capsule One capsule every other day (Patient taking differently: Take 5,000 Units by mouth every other day. One capsule every other day)  . citalopram (CELEXA) 20 MG tablet Take 1 tablet daily  . clopidogrel (PLAVIX) 75 MG tablet Take 1 tablet (75 mg total) by mouth daily with breakfast.  . Fluticasone-Salmeterol (ADVAIR) 250-50 MCG/DOSE AEPB Inhale 1 puff into the lungs as needed (for shortness of breath).  . isosorbide mononitrate (IMDUR) 30 MG 24 hr tablet TAKE 1 TABLET(30 MG) BY MOUTH DAILY  . metoprolol (LOPRESSOR) 50 MG tablet Take 1 tablet (50 mg total) by mouth 2 (two) times daily.  . mirtazapine (REMERON) 7.5 MG tablet Take 1 tablet (7.5 mg total) by mouth at bedtime.  . Multiple Vitamin  (MULTIVITAMIN) capsule Take 1 capsule by mouth daily.  . nitroGLYCERIN (NITROSTAT) 0.4 MG SL tablet PLACE 1 TABLET UNDER THE TONGUE EVERY 5 MINUTES AS NEEDED FOR CHEST PAIN, 3 DOSES MAX  . omega-3 acid ethyl esters (LOVAZA) 1 g capsule Take 2 capsules (2 g total) by mouth 2 (two) times daily.  . pantoprazole (PROTONIX) 40 MG tablet Take 1 tablet (40 mg total) by mouth daily.  . Probiotic Product [facility] BIFIDOBLEND) CAPS Take 1 capsule by mouth daily.  . vitamin B-12 (CYANOCOBALAMIN) 250 MCG tablet Take 250 mcg by mouth daily.  . [DISCONTINUED] ondansetron (ZOFRAN ODT) 4 MG disintegrating tablet Take 1 tablet (4 mg total) by mouth every 8 (eight) hours as needed for nausea or vomiting.   No facility-administered encounter medications on file as of 03/04/2016.     Allergies (verified) Abilify [aripiprazole]   History: Past Medical History:  Diagnosis Date  . Abnormal mammogram 2007   Recommend close follow up  . Anemia    Iron, b12, SPEP, UPEP normal, 12/2012  . Anxiety   . Aortic stenosis    mild to moderate  . aortic stenosis   . Arthritis   . Asthma   . CAD (coronary artery disease)    s/p angioplasty 1996  . Depression   . Elevated LFTs 2010   s/p ultrasound w/ possible fatty liver;  GI consult. Resolution 2011  . Gastric ulcer   . GERD (gastroesophageal reflux disease)   . History of colonic polyps    Dr. Vira Agar  . Hyperlipidemia   . Hypertension   . Peritoneal carcinomatosis [facility]) 2001   Dr. Oliva Bustard & Dr. Claiborne Rigg s/p chemo  . Shingles 2008   T-9 distribution  . Valvular heart disease    Mild to Moderate MR, AS  . Vitamin D deficiency    Past Surgical History:  Procedure Laterality Date  . ABDOMINAL HYSTERECTOMY  2001  . APPENDECTOMY    . CARDIAC CATHETERIZATION  2007   50% mid LAD stenosis, 70% proximal RCA with 100% distal RCA stenosis with collaterals, EF 65%  . CARDIAC CATHETERIZATION N/A 04/27/2015   Procedure: Left Heart Cath and Coronary  Angiography;  Surgeon: Jettie Booze, MD;  Location: [place] [facility];  Service: Cardiovascular;  Laterality: N/A;  . CARDIAC CATHETERIZATION N/A 04/27/2015   Procedure: Coronary Stent Intervention;  Surgeon: Jettie Booze, MD;  Location: [place] [facility];  Service: Cardiovascular;  Laterality: N/A;  . CHOLECYSTECTOMY  1987  . COLECTOMY  2001  . CORONARY ANGIOPLASTY  1996  . LAPAROSCOPIC BILATERAL SALPINGO OOPHERECTOMY  1988   Dr. Laverta Baltimore  . TONSILLECTOMY  1950   Family History  Problem Relation Age of Onset  . Hypertension Mother   . Stroke Mother 9    Cerebral Hemorrhage  . Cancer Father     bone cancer  . Diabetes Sister   . Diabetes Brother   . Heart disease Brother   . Cancer Daughter 22    Ovarian cancer  . Colon cancer Neg Hx   . Colon polyps Neg Hx    Social History   Occupational History  . [employer]     Retired  . 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 Smoking status: Never Smoker  . Smokeless tobacco: Never Used  . Alcohol use No  . Drug use: No  . Sexual activity: No    Tobacco Counseling Counseling given: Not Answered   Activities of Daily Living In your present state of health, do you have any difficulty performing the following activities: 03/04/2016 04/27/2015  Hearing? N -  Vision? N -  Difficulty concentrating or making decisions? Y -  Walking or climbing stairs? Y -  Dressing or bathing? N -  Doing errands, shopping? N N  Preparing Food and eating ? N -  Using the Toilet? N -  In the past six months, have you accidently leaked urine? N -  Do you have problems with loss of bowel control? N -  Managing your Medications? N -  Managing your Finances? N -  Housekeeping or managing your Housekeeping? N -  Some recent data might be hidden    Immunizations and Health Maintenance Immunization History  Administered Date(s) Administered  . DTaP 06/23/2011  . Influenza,inj,Quad PF,36+ Mos  04/17/2014, 05/07/2015  . Pneumococcal Conjugate-13 03/04/2016  . Pneumococcal Polysaccharide-23 11/20/2005, 11/21/2010  . Zoster 01/21/2011   Health Maintenance Due  Topic Date Due  . TETANUS/TDAP  04/16/1956  . DEXA SCAN  04/16/2002  . INFLUENZA VACCINE  02/26/2016    Patient Care Team: [name], MD as PCP - General (Family Medicine) Sherryl Barters, NP as Nurse Practitioner (Nurse Practitioner)  Indicate any recent Medical Services you may have received from other than [facility] providers in the past year (date may  be approximate).     Assessment:   This is a routine wellness examination for [place].  The goal of the wellness visit is to assist the patient how to close the gaps in care and create a preventative care plan for the patient.   Taking calcium VIT D as appropriate/Osteoporosis risk reviewed.  DEXA SCAN ordered and scheduled.  Medications reviewed; taking without issues or barriers.  Safety issues reviewed; smoke and carbon monoxide detectors in the home. No firearms in the home. Wears seatbelts when driving or riding with others. No violence in the home.  No identified risk were noted; The patient was oriented x 3; appropriate in dress and manner and no objective failures at ADL's or IADL's.   Body mass index; discussed the importance of a healthy diet, water intake and exercise. Educational material provided.  Prevnar 13 vaccine administered, R deltoid.  Tolerated well.  Malignant neoplasm-stable and followed by Dr. Ellis Parents  Patient Concerns: Bilateral hand stiffness; increasing stiffness/weakness over the last 2 months.  No pain.  Encouraged hand exercises.  Deferred to PCP for follow up.  Appointment scheduled.    Hearing/Vision screen Hearing Screening Comments: Passed the whisper test Vision Screening Comments: [facility] Wears glasses Annual visits Last OV 04/2015  Dietary issues and exercise activities  discussed: Current Exercise Habits: Home exercise routine, Type of exercise: walking, Time (Minutes): 45, Frequency (Times/Week): 3, Weekly Exercise (Minutes/Week): 135, Intensity: Mild  Goals    . Increase water intake          STAY HYDRATED AND DRINK PLENTY OF FLUIDS/WATER.      Depression Screen PHQ 2/9 Scores 03/04/2016  PHQ - 2 Score 0    Fall Risk Fall Risk  03/04/2016  Falls in the past year? Yes  Number falls in past yr: 1  Injury with Fall? Yes  Follow up Falls prevention discussed    Cognitive Function: MMSE - Mini Mental State Exam 03/04/2016  Orientation to time 5  Orientation to Place 5  Registration 3  Attention/ Calculation 5  Recall 2  Language- name 2 objects 2  Language- repeat 1  Language- follow 3 step command 3  Language- read & follow direction 1  Write a sentence 1  Copy design 1  Total score 29    Screening Tests Health Maintenance  Topic Date Due  . TETANUS/TDAP  04/16/1956  . DEXA SCAN  04/16/2002  . INFLUENZA VACCINE  02/26/2016  . ZOSTAVAX  Completed  . PNA vac Low Risk Adult  Completed      Plan:   End of life planning; Advance aging; Advanced directives discussed. Copy of current HCPOA/Living Will requested.   During the course of the visit, Sol[place] was educated and counseled about the following appropriate screening and preventive services:   Vaccines to include Pneumoccal, Influenza, Hepatitis B, Td, Zostavax, HCV  Electrocardiogram  Cardiovascular disease screening  Colorectal cancer screening  Bone density screening  Diabetes screening  Glaucoma screening  Mammography/PAP  Nutrition counseling  Smoking cessation counseling  Patient Instructions (the written plan) were given to the patient.    Varney Biles, LPN   [id number]

## 2016-03-04 NOTE — Patient Instructions (Addendum)
[name] , Thank you for taking time to come for your Medicare Wellness Visit. I appreciate your ongoing commitment to your health goals. Please review the following plan we discussed and let me know if I can assist you in the future.    FOLLOW UP WITH DR. SONNENBERG AS NEEDED.  FOLLOW UP WITH INSURANCE REGARDING TDAP VACCINE.  DEXA SCAN (Bone Density) AS DIRECTED.  These are the goals we discussed: Goals    . Increase water intake          STAY HYDRATED AND DRINK PLENTY OF FLUIDS/WATER.       This is a list of the screening recommended for you and due dates:  Health Maintenance  Topic Date Due  . Tetanus Vaccine  04/16/1956  . DEXA scan (bone density measurement)  04/16/2002  . Flu Shot  02/26/2016  . Shingles Vaccine  Completed  . Pneumonia vaccines  Completed   Bone Densitometry Bone densitometry is an imaging test that uses a special X-ray to measure the amount of calcium and other minerals in your bones (bone density). This test is also known as a bone mineral density test or dual-energy X-ray absorptiometry (DXA). The test can measure bone density at your hip and your spine. It is similar to having a regular X-ray. You may have this test to:  Diagnose a condition that causes weak or thin bones (osteoporosis).  Predict your risk of a broken bone (fracture).  Determine how well osteoporosis treatment is working. LET [facility] CARE PROVIDER KNOW ABOUT:  Any allergies you have.  All medicines you are taking, including vitamins, herbs, eye drops, creams, and over-the-counter medicines.  Previous problems you or members of your family have had with the use of anesthetics.  Any blood disorders you have.  Previous surgeries you have had.  Medical conditions you have.  Possibility of pregnancy.  Any other medical test you had within the previous 14 days that used contrast material. RISKS AND COMPLICATIONS Generally, this is a safe procedure. However, problems  can occur and may include the following:  This test exposes you to a very small amount of radiation.  The risks of radiation exposure may be greater to unborn children. BEFORE THE PROCEDURE  Do not take any calcium supplements for 24 hours before having the test. You can otherwise eat and drink what you usually do.  Take off all metal jewelry, eyeglasses, dental appliances, and any other metal objects. PROCEDURE  You may lie on an exam table. There will be an X-ray generator below you and an imaging device above you.  Other devices, such as boxes or braces, may be used to position your body properly for the scan.  You will need to lie still while the machine slowly scans your body.  The images will show up on a computer monitor. AFTER THE PROCEDURE You may need more testing at a later time.   This information is not intended to replace advice given to you by your health care provider. Make sure you discuss any questions you have with your health care provider.   Document Released: 08/05/2004 Document Revised: 08/04/2014 Document Reviewed: 12/22/2013 Elsevier Interactive Patient Education 2016 [place] in the Home  Falls can cause injuries. They can happen to people of all ages. There are many things you can do to make your home safe and to help prevent falls.  WHAT CAN I DO ON THE OUTSIDE OF MY HOME?  Regularly fix the  edges of walkways and driveways and fix any cracks.  Remove anything that might make you trip as you walk through a door, such as a raised step or threshold.  Trim any bushes or trees on the path to your home.  Use bright outdoor lighting.  Clear any walking paths of anything that might make someone trip, such as rocks or tools.  Regularly check to see if handrails are loose or broken. Make sure that both sides of any steps have handrails.  Any raised decks and porches should have guardrails on the edges.  Have any leaves, snow, or  ice cleared regularly.  Use sand or salt on walking paths during winter.  Clean up any spills in your garage right away. This includes oil or grease spills. WHAT CAN I DO IN THE BATHROOM?   Use night lights.  Install grab bars by the toilet and in the tub and shower. Do not use towel bars as grab bars.  Use non-skid mats or decals in the tub or shower.  If you need to sit down in the shower, use a plastic, non-slip stool.  Keep the floor dry. Clean up any water that spills on the floor as soon as it happens.  Remove soap buildup in the tub or shower regularly.  Attach bath mats securely with double-sided non-slip rug tape.  Do not have throw rugs and other things on the floor that can make you trip. WHAT CAN I DO IN THE BEDROOM?  Use night lights.  Make sure that you have a light by your bed that is easy to reach.  Do not use any sheets or blankets that are too big for your bed. They should not hang down onto the floor.  Have a firm chair that has side arms. You can use this for support while you get dressed.  Do not have throw rugs and other things on the floor that can make you trip. WHAT CAN I DO IN THE KITCHEN?  Clean up any spills right away.  Avoid walking on wet floors.  Keep items that you use a lot in easy-to-reach places.  If you need to reach something above you, use a strong step stool that has a grab bar.  Keep electrical cords out of the way.  Do not use floor polish or wax that makes floors slippery. If you must use wax, use non-skid floor wax.  Do not have throw rugs and other things on the floor that can make you trip. WHAT CAN I DO WITH MY STAIRS?  Do not leave any items on the stairs.  Make sure that there are handrails on both sides of the stairs and use them. Fix handrails that are broken or loose. Make sure that handrails are as long as the stairways.  Check any carpeting to make sure that it is firmly attached to the stairs. Fix any carpet  that is loose or worn.  Avoid having throw rugs at the top or bottom of the stairs. If you do have throw rugs, attach them to the floor with carpet tape.  Make sure that you have a light switch at the top of the stairs and the bottom of the stairs. If you do not have them, ask someone to add them for you. WHAT ELSE CAN I DO TO HELP PREVENT FALLS?  Wear shoes that:  Do not have high heels.  Have rubber bottoms.  Are comfortable and fit you well.  Are closed at the toe.  Do not wear sandals.  If you use a stepladder:  Make sure that it is fully opened. Do not climb a closed stepladder.  Make sure that both sides of the stepladder are locked into place.  Ask someone to hold it for you, if possible.  Clearly mark and make sure that you can see:  Any grab bars or handrails.  First and last steps.  Where the edge of each step is.  Use tools that help you move around (mobility aids) if they are needed. These include:  Canes.  Walkers.  Scooters.  Crutches.  Turn on the lights when you go into a dark area. Replace any light bulbs as soon as they burn out.  Set up your furniture so you have a clear path. Avoid moving your furniture around.  If any of your floors are uneven, fix them.  If there are any pets around you, be aware of where they are.  Review your medicines with your doctor. Some medicines can make you feel dizzy. This can increase your chance of falling. Ask your doctor what other things that you can do to help prevent falls.   This information is not intended to replace advice given to you by your health care provider. Make sure you discuss any questions you have with your health care provider.   Document Released: 05/10/2009 Document Revised: 11/28/2014 Document Reviewed: 08/18/2014 Elsevier Interactive Patient Education [employer].

## 2016-03-27 ENCOUNTER — Ambulatory Visit
Admission: RE | Admit: 2016-03-27 | Discharge: 2016-03-27 | Disposition: A | Discharge status: Home / Self Care | Payer: Medicare Other | Source: Ambulatory Visit | Attending: Internal Medicine | Admitting: Internal Medicine

## 2016-03-27 DIAGNOSIS — M85852 Other specified disorders of bone density and structure, left thigh: Secondary | ICD-10-CM | POA: Diagnosis not present

## 2016-03-27 DIAGNOSIS — E2839 Other primary ovarian failure: Secondary | ICD-10-CM | POA: Insufficient documentation

## 2016-03-27 DIAGNOSIS — M8588 Other specified disorders of bone density and structure, other site: Secondary | ICD-10-CM | POA: Diagnosis not present

## 2016-03-27 DIAGNOSIS — M85862 Other specified disorders of bone density and structure, left lower leg: Secondary | ICD-10-CM | POA: Diagnosis not present

## 2016-04-02 ENCOUNTER — Telehealth: Payer: Self-pay | Admitting: *Deleted

## 2016-04-02 ENCOUNTER — Other Ambulatory Visit: Payer: Self-pay

## 2016-04-02 NOTE — Telephone Encounter (Signed)
Patient was informed of results.  Patient understood and no questions, comments, or concerns at this time.  

## 2016-04-02 NOTE — Telephone Encounter (Signed)
Patient has requested her bone density results  Pt contact [phone]

## 2016-04-14 ENCOUNTER — Other Ambulatory Visit: Payer: Self-pay | Admitting: *Deleted

## 2016-04-14 MED ORDER — ATORVASTATIN CALCIUM 80 MG PO TABS: 80.0000 mg | ORAL_TABLET | Freq: Every day | ORAL | 1 refills | Status: DC

## 2016-04-14 MED ORDER — CLOPIDOGREL BISULFATE 75 MG PO TABS: 75.0000 mg | ORAL_TABLET | Freq: Every day | ORAL | 1 refills | Status: DC

## 2016-04-16 ENCOUNTER — Encounter: Payer: Self-pay | Admitting: Cardiovascular Disease

## 2016-04-16 ENCOUNTER — Other Ambulatory Visit: Payer: Self-pay | Admitting: Cardiovascular Disease

## 2016-04-28 ENCOUNTER — Other Ambulatory Visit: Payer: Medicare Other | Admitting: *Deleted

## 2016-04-28 DIAGNOSIS — I2510 Atherosclerotic heart disease of native coronary artery without angina pectoris: Secondary | ICD-10-CM | POA: Diagnosis not present

## 2016-04-28 DIAGNOSIS — E785 Hyperlipidemia, unspecified: Secondary | ICD-10-CM

## 2016-04-28 LAB — HEPATIC FUNCTION PANEL
ALT: 25 U/L (ref 6–29)
AST: 36 U/L — ABNORMAL HIGH (ref 10–35)
Albumin: 4.2 g/dL (ref 3.6–5.1)
Alkaline Phosphatase: 45 U/L (ref 33–130)
Bilirubin, Direct: 0.1 mg/dL (ref <=0.2)
Indirect Bilirubin: 0.5 mg/dL (ref 0.2–1.2)
Total Bilirubin: 0.6 mg/dL (ref 0.2–1.2)
Total Protein: 6.7 g/dL (ref 6.1–8.1)

## 2016-04-28 LAB — LIPID PANEL
Cholesterol: 88 mg/dL — ABNORMAL LOW (ref 125–200)
HDL: 42 mg/dL — ABNORMAL LOW (ref >=46)
LDL Cholesterol: 16 mg/dL (ref <130)
Total CHOL/HDL Ratio: 2.1 Ratio (ref <=5.0)
Triglycerides: 151 mg/dL — ABNORMAL HIGH (ref <150)
VLDL: 30 mg/dL (ref <30)

## 2016-04-28 LAB — BASIC METABOLIC PANEL
BUN: 8 mg/dL (ref 7–25)
CO2: 26 mmol/L (ref 20–31)
Calcium: 9.3 mg/dL (ref 8.6–10.4)
Chloride: 99 mmol/L (ref 98–110)
Creat: 0.73 mg/dL (ref 0.60–0.93)
Glucose, Bld: 115 mg/dL — ABNORMAL HIGH (ref 65–99)
Potassium: 4.3 mmol/L (ref 3.5–5.3)
Sodium: 134 mmol/L — ABNORMAL LOW (ref 135–146)

## 2016-04-28 NOTE — Addendum Note (Signed)
Addended by: Eulis Foster on: 04/28/2016 10:03 AM   Modules accepted: Orders

## 2016-05-01 ENCOUNTER — Encounter (INDEPENDENT_AMBULATORY_CARE_PROVIDER_SITE_OTHER): Payer: Self-pay

## 2016-05-01 ENCOUNTER — Ambulatory Visit (INDEPENDENT_AMBULATORY_CARE_PROVIDER_SITE_OTHER): Payer: Medicare Other | Admitting: Cardiovascular Disease

## 2016-05-01 ENCOUNTER — Other Ambulatory Visit: Payer: Self-pay | Admitting: Cardiovascular Disease

## 2016-05-01 ENCOUNTER — Encounter: Payer: Self-pay | Admitting: Cardiovascular Disease

## 2016-05-01 VITALS — BP 130/74 | HR 64 | Ht 59.0 in | Wt 135.0 lb

## 2016-05-01 DIAGNOSIS — E785 Hyperlipidemia, unspecified: Secondary | ICD-10-CM

## 2016-05-01 DIAGNOSIS — I2510 Atherosclerotic heart disease of native coronary artery without angina pectoris: Secondary | ICD-10-CM

## 2016-05-01 DIAGNOSIS — I200 Unstable angina: Secondary | ICD-10-CM

## 2016-05-01 MED ORDER — METOPROLOL TARTRATE 50 MG PO TABS: 50.0000 mg | ORAL_TABLET | Freq: Two times a day (BID) | ORAL | 3 refills | Status: DC

## 2016-05-01 NOTE — Progress Notes (Signed)
Cardiology Office Note   Date:  05/01/2016   ID:  Amy Morton, DOB 03-Dec-1934, MRN [id number]  PCP:  [name], MD  Cardiologist:   Mertie Moores, MD   Chief Complaint  Patient presents with  . Coronary Artery Disease   1. CAD - s/p PTCA - 1996. S/p PCI of her LCX Sept. 2016 2. Hyperlipidemia 3. Anxiety/depression 4.  History of Present Illness:  Amy Morton is a 81 yo with hx of CAD - s/p PCI in 1996 Amy Morton)  She has been seeing Dr. Rosalita Levan. Wanted to changed cardiologist.  She was recently prescribed Abilify and had a bad reaction. She stated that it made her feel quite bad ( chest burning) She has stopped the medication and now feels quite a bit better. She was seen in the ER and had marked HTN. BP is now better.   She walks daily.  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 28, 2016 Amy Morton is a 81 y.o. female who presents for CAD and hyperlipidemia. She has been having exertional CP for 3-4 weeks. Tightness, pressure, mid sternal , radiates out across her chest.  No radiation down arm . Associated with increased dyspnea. Does not have any NTG Has been limiting her exertional activites.  Has trouble getting her mail . Feels very similar to her previous episodes of angina prior to PCI.   Oct. 10, 2016:  Adi is seen back today for her coronary artery disease. I saw her on April 24, 2016  with symptoms of unstable angina. She had a cardiac catheterization by Dr. Irish Lack and was found to have chronic total occlusion of her right coronary artery. The mid LAD had a 70% stenosis. The left circumflex artery had a tight stenosis and had stents placed. The mid LAD stenosis is difficult to approach because of a very sharp bend. She is basically pain free. Has had some sharp pains that woke her from sleep , took a SL NTG and the pain resolved.  Getting regular exercise - walking regualry   November 12, 2015:  She fell in February and had a consussion.   Still having problems with that.  Vertigo issues.  Also has orthostatic hypotension   Doing well from a cardiac standpoint .   Oct. 5, 2017:  No CP or dyspnea C/o some swelling in legs. ( Appears to be very minimal swelling by exam )    Past Medical History:  Diagnosis Date  . Abnormal mammogram 2007   Recommend close follow up  . Anemia    Iron, b12, SPEP, UPEP normal, 12/2012  . Anxiety   . Aortic stenosis    mild to moderate  . aortic stenosis   . Arthritis   . Asthma   . CAD (coronary artery disease)    s/p angioplasty 1996  . Depression   . Elevated LFTs 2010   s/p ultrasound w/ possible fatty liver; GI consult. Resolution 2011  . Gastric ulcer   . GERD (gastroesophageal reflux disease)   . History of colonic polyps    Dr. Vira Agar  . Hyperlipidemia   . Hypertension   . Peritoneal carcinomatosis [facility]) 2001   Dr. Oliva Bustard & Dr. Claiborne Rigg s/p chemo  . Shingles 2008   T-9 distribution  . Valvular heart disease    Mild to Moderate MR, AS  . Vitamin D deficiency     Past Surgical History:  Procedure Laterality Date  .  ABDOMINAL HYSTERECTOMY  2001  . APPENDECTOMY    . CARDIAC CATHETERIZATION  2007   50% mid LAD stenosis, 70% proximal RCA with 100% distal RCA stenosis with collaterals, EF 65%  . CARDIAC CATHETERIZATION N/A 04/27/2015   Procedure: Left Heart Cath and Coronary Angiography;  Surgeon: Jettie Booze, MD;  Location: [place] [facility];  Service: Cardiovascular;  Laterality: N/A;  . CARDIAC CATHETERIZATION N/A 04/27/2015   Procedure: Coronary Stent Intervention;  Surgeon: Jettie Booze, MD;  Location: [place] [facility];  Service: Cardiovascular;  Laterality: N/A;  . CHOLECYSTECTOMY  1987  . COLECTOMY  2001  . CORONARY ANGIOPLASTY  1996  . LAPAROSCOPIC BILATERAL SALPINGO OOPHERECTOMY  1988   Dr. Laverta Baltimore  . TONSILLECTOMY  1950     Current Outpatient Prescriptions  Medication Sig Dispense  Refill  . acetaminophen (TYLENOL) 650 MG CR tablet Take 650 mg by mouth as needed for pain.     Marland Kitchen albuterol (PROVENTIL HFA;VENTOLIN HFA) 108 (90 BASE) MCG/ACT inhaler Inhale 2 puffs into the lungs every 4 (four) hours as needed for wheezing or shortness of breath.     Marland Kitchen aspirin 81 MG tablet Take 81 mg by mouth daily.    Marland Kitchen atorvastatin (LIPITOR) 80 MG tablet Take 1 tablet (80 mg total) by mouth daily at 6 PM. 90 tablet 1  . BOOSTRIX 5-2.5-18.5 injection Inject as directed once.  0  . busPIRone (BUSPAR) 5 MG tablet Take 1 tablet (5 mg total) by mouth 2 (two) times daily. (Patient taking differently: Take 5 mg by mouth daily. ) 180 tablet 3  . calcium gluconate 650 MG tablet Take 650 mg by mouth 2 (two) times daily.    . Cholecalciferol ([employer] VIT D 5000 HIGH-POTENCY) 5000 UNITS capsule One capsule every other day (Patient taking differently: Take 5,000 Units by mouth every other day. One capsule every other day)    . citalopram (CELEXA) 20 MG tablet Take 1 tablet daily 90 tablet 3  . clopidogrel (PLAVIX) 75 MG tablet Take 1 tablet (75 mg total) by mouth daily with breakfast. 90 tablet 1  . Fluticasone-Salmeterol (ADVAIR) 250-50 MCG/DOSE AEPB Inhale 1 puff into the lungs as needed (for shortness of breath). 60 each 0  . isosorbide mononitrate (IMDUR) 30 MG 24 hr tablet TAKE 1 TABLET(30 MG) BY MOUTH DAILY 30 tablet 3  . metoprolol (LOPRESSOR) 50 MG tablet Take 1 tablet (50 mg total) by mouth 2 (two) times daily. 180 tablet 3  . mirtazapine (REMERON) 7.5 MG tablet Take 1 tablet (7.5 mg total) by mouth at bedtime. 90 tablet 3  . Multiple Vitamin (MULTIVITAMIN) capsule Take 1 capsule by mouth daily.    . nitroGLYCERIN (NITROSTAT) 0.4 MG SL tablet PLACE 1 TABLET UNDER THE TONGUE EVERY 5 MINUTES AS NEEDED FOR CHEST PAIN, 3 DOSES MAX 75 tablet 1  . omega-3 acid ethyl esters (LOVAZA) 1 g capsule Take 2 capsules (2 g total) by mouth 2 (two) times daily. 360 capsule 3  . pantoprazole (PROTONIX) 40 MG tablet Take  1 tablet (40 mg total) by mouth daily. 30 tablet 6  . Probiotic Product (FLORAJEN BIFIDOBLEND) CAPS Take 1 capsule by mouth daily. 90 capsule 3  . vitamin B-12 (CYANOCOBALAMIN) 250 MCG tablet Take 250 mcg by mouth daily.     No current facility-administered medications for this visit.     Allergies:   Abilify [aripiprazole]    Social History:  The patient  reports that she has never smoked. She has never used  smokeless tobacco. She reports that she does not drink alcohol or use drugs.   Family History:  The patient's family history includes Cancer in her father; Cancer (age of onset: 21) in her daughter; Diabetes in her brother and sister; Heart disease in her brother; Hypertension in her mother; Stroke (age of onset: 11) in her mother.    ROS:  Please see the history of present illness.    Review of Systems: Constitutional:  denies fever, chills, diaphoresis, appetite change and fatigue.  HEENT: denies photophobia, eye pain, redness, hearing loss, ear pain, congestion, sore throat, rhinorrhea, sneezing, neck pain, neck stiffness and tinnitus.  Respiratory: denies SOB, DOE, cough, chest tightness, and wheezing.  Cardiovascular: denies chest pain, palpitations and leg swelling.  Gastrointestinal: denies nausea, vomiting, abdominal pain, diarrhea, constipation, blood in stool.  Genitourinary: denies dysuria, urgency, frequency, hematuria, flank pain and difficulty urinating.  Musculoskeletal: denies  myalgias, back pain, joint swelling, arthralgias and gait problem.   Skin: denies pallor, rash and wound.  Neurological: denies dizziness, seizures, syncope, weakness, light-headedness, numbness and headaches.   Hematological: denies adenopathy, easy bruising, personal or family bleeding history.  Psychiatric/ Behavioral: denies suicidal ideation, mood changes, confusion, nervousness, sleep disturbance and agitation.       All other systems are reviewed and negative.    PHYSICAL  EXAM: VS:  BP 130/74 (BP Location: Left Arm, Patient Position: Sitting, Cuff Size: Normal)   Pulse 64   Ht 4\' 11"  (1.499 m)   Wt 135 lb (61.2 kg)   BMI 27.27 kg/m  , BMI Body mass index is 27.27 kg/m. GEN: Well nourished, well developed, in no acute distress  HEENT: normal  Neck: no JVD, carotid bruits, or masses Cardiac: RRR; 1-2 / 6 systolic murmur, rubs, or gallops, no significant  edema  Respiratory:  clear to auscultation bilaterally, normal work of breathing GI: soft, nontender, nondistended, + BS MS:  Large bruise on right forearm Skin: warm and dry, no rash Neuro:  Strength and sensation are intact Psych: normal   EKG:  EKG is ordered today. The ekg ordered today demonstrates normal sinus rhythm at 61. She has no ST or T wave changes. The EKG is normal.   Recent Labs: 08/23/2015: Pro B Natriuretic peptide (BNP) 164.0 09/24/2015: Hemoglobin 10.1; Platelets 205.0 04/28/2016: ALT 25; BUN 8; Creat 0.73; Potassium 4.3; Sodium 134    Lipid Panel    Component Value Date/Time   CHOL 88 (L) 04/28/2016 1003   CHOL 148 10/19/2014 1212   TRIG 151 (H) 04/28/2016 1003   HDL 42 (L) 04/28/2016 1003   HDL 48 10/19/2014 1212   CHOLHDL 2.1 04/28/2016 1003   VLDL 30 04/28/2016 1003   LDLCALC 16 04/28/2016 1003   LDLCALC 61 10/19/2014 1212      Wt Readings from Last 3 Encounters:  05/01/16 135 lb (61.2 kg)  03/04/16 134 lb 12.8 oz (61.1 kg)  11/21/15 137 lb 6.4 oz (62.3 kg)      Other studies Reviewed: Additional studies/ records that were reviewed today include:  Review of the above records demonstrates:    ASSESSMENT AND PLAN:  1. Unstable angina  - s/p PTCA - 1996 - unit is doing much better. She status post PCI of her left circumflex artery 04/26/16.   Marland Kitchen  Dist RCA lesion, 100% stenosed. Known from 2012 catheterization. Short occlusion but heavily calcified. There is a stump if intervention was to be attempted.  Focal Mid LAD-2 lesion, 70% stenosed just after a  diagonal  at a tortuos segment. The lesion was not previously treated.  Mid Cx lesion, 70% stenosed. There is a 0% residual stenosis post intervention with overlapping DES , Synergy 3.0 x 12 and Synergy 2.5 x 8, all postdilated with a 3.25 balloon. The lesion was not previously treated.  She is on Omeprazole and plavix.  I would prefer that she continue her plavix - although it is not required.   2. Hyperlipidemia -  We'll check fasting labs in 6 months.   3. Anxiety/depression followed by her general medical doctor.   Current medicines are reviewed at length with the patient today.  The patient does not have concerns regarding medicines.  The following changes have been made:  no change  Labs/ tests ordered today include:  Orders Placed This Encounter  Procedures  . EKG 12-Lead     Disposition:   FU with me in 6 months     Signed, Mertie Moores, MD  05/01/2016 2:00 PM    [address], [place], [place]  [address] Phone: [phone]; Fax: [phone]

## 2016-05-01 NOTE — Patient Instructions (Signed)

## 2016-05-02 ENCOUNTER — Other Ambulatory Visit: Payer: Self-pay | Admitting: Cardiovascular Disease

## 2016-05-02 DIAGNOSIS — Z23 Encounter for immunization: Secondary | ICD-10-CM | POA: Diagnosis not present

## 2016-05-02 NOTE — Telephone Encounter (Signed)
nitroGLYCERIN (NITROSTAT) 0.4 MG SL tablet  Medication  Date: 05/02/2016 Department: [address] Office Ordering/Authorizing: Thayer Headings, MD  Order Providers   Prescribing Provider Encounter Provider  Thayer Headings, MD Thayer Headings, MD  Medication Detail    Disp Refills Start End   nitroGLYCERIN (NITROSTAT) 0.4 MG SL tablet 75 tablet 3 05/02/2016    Sig: PLACE 1 TABLET UNDER THE TONGUE EVERY 5 MINUTES AS NEEDED FOR CHEST PAIN, 3 DOSES MAX   E-Prescribing Status: Receipt confirmed by pharmacy (05/02/2016 1:05 PM EDT)   [facility] [id number] - [place], [place]

## 2016-05-20 DIAGNOSIS — D229 Melanocytic nevi, unspecified: Secondary | ICD-10-CM | POA: Diagnosis not present

## 2016-05-20 DIAGNOSIS — Z85828 Personal history of other malignant neoplasm of skin: Secondary | ICD-10-CM | POA: Diagnosis not present

## 2016-05-20 DIAGNOSIS — L570 Actinic keratosis: Secondary | ICD-10-CM | POA: Diagnosis not present

## 2016-05-20 DIAGNOSIS — L820 Inflamed seborrheic keratosis: Secondary | ICD-10-CM | POA: Diagnosis not present

## 2016-05-20 DIAGNOSIS — L821 Other seborrheic keratosis: Secondary | ICD-10-CM | POA: Diagnosis not present

## 2016-05-20 DIAGNOSIS — L578 Other skin changes due to chronic exposure to nonionizing radiation: Secondary | ICD-10-CM | POA: Diagnosis not present

## 2016-05-22 ENCOUNTER — Encounter: Payer: Self-pay | Admitting: Family Medicine

## 2016-05-22 ENCOUNTER — Ambulatory Visit (INDEPENDENT_AMBULATORY_CARE_PROVIDER_SITE_OTHER): Payer: Medicare Other | Admitting: Family Medicine

## 2016-05-22 ENCOUNTER — Ambulatory Visit: Payer: Medicare Other | Admitting: Nurse Practitioner

## 2016-05-22 ENCOUNTER — Encounter (INDEPENDENT_AMBULATORY_CARE_PROVIDER_SITE_OTHER): Payer: Self-pay

## 2016-05-22 DIAGNOSIS — F419 Anxiety disorder, unspecified: Secondary | ICD-10-CM

## 2016-05-22 DIAGNOSIS — I200 Unstable angina: Secondary | ICD-10-CM

## 2016-05-22 DIAGNOSIS — M1990 Unspecified osteoarthritis, unspecified site: Secondary | ICD-10-CM

## 2016-05-22 NOTE — Assessment & Plan Note (Addendum)
Chronic issue. Patient with arthritis in her bilateral hands. Given distribution in PIP and DIP joints I suspect osteoarthritis. She's not taking any medications for this now. I advised on Tylenol 1000 mg twice daily for the next week to see if this will be beneficial. I advised against anti-inflammatory use given her cardiac history. She will monitor on this regimen.

## 2016-05-22 NOTE — Progress Notes (Signed)
Pre visit review using our clinic review tool, if applicable. No additional management support is needed unless otherwise documented below in the visit note. 

## 2016-05-22 NOTE — Assessment & Plan Note (Signed)
Worsened recently. Depression as well. Currently on Celexa and BuSpar. Suspect her symptoms are reactionary to her daughter having cancer. Discussed this issue with the patient at length. I offered referral to a counselor though she declined at this time. She'll continue to monitor. If worsen she'll let us know.

## 2016-05-22 NOTE — Patient Instructions (Signed)
Nice to meet you. You have arthritis in her hands. We will try Tylenol 1000 mg twice daily to help with her discomfort. If this does not work please let us know. Please continue your anxiety medications and monitor symptoms. If she decides she wants to talk to a counselor please let us know.

## 2016-05-22 NOTE — Progress Notes (Signed)
  [name], MD Phone: [phone]  Amy Morton is a 81 y.o. female who presents today for follow-up.  Anxiety: Patient notes this is worse recently. She found out her daughter has metastatic terminal cancer a couple of months ago. It's been very hard to deal with this. She notes she does feel some depressed. No SI. She is currently on Celexa and BuSpar for baseline anxiety. She's been able to talk to several friends and she has a friend who is a former Merchandiser, retail. They have been helpful. Also trying to support her daughter and son-in-law.  Arthritis: Patient notes she has swelling and discomfort in her bilateral PIP and DIP and MCP joints. This has been going on for a long time. Notes sometimes her joints burn in her hands. Notes they're stiff. Feels as though she occasionally can't close her fist due to the stiffness. She's not taking any meds consistently for this. Takes Tylenol occasionally.  PMH: nonsmoker.   ROS see history of present illness  Objective  Physical Exam Vitals:   05/22/16 1331  BP: 104/60  Pulse: 68  Temp: 98.5 F (36.9 C)    BP Readings from Last 3 Encounters:  05/22/16 104/60  05/01/16 130/74  03/04/16 136/70   Wt Readings from Last 3 Encounters:  05/22/16 137 lb 3.2 oz (62.2 kg)  05/01/16 135 lb (61.2 kg)  03/04/16 134 lb 12.8 oz (61.1 kg)    Physical Exam  Constitutional: No distress.  HENT:  Head: Normocephalic and atraumatic.  Cardiovascular: Normal rate, regular rhythm and normal heart sounds.   Pulmonary/Chest: Effort normal and breath sounds normal.  Musculoskeletal:  Bilateral hands with swelling of the PIP and DIP joints with mild tenderness, no warmth or erythema to the joints, full range of motion in bilateral fingers and hands, 5 out of 5 strength in grip, sensation to light touch intact upper extremities  Skin: Skin is warm and dry. She is not diaphoretic.  Psychiatric:  Mood depressed and anxious, affect flat      Assessment/Plan: Please see individual problem list.  Anxiety Worsened recently. Depression as well. Currently on Celexa and BuSpar. Suspect her symptoms are reactionary to her daughter having cancer. Discussed this issue with the patient at length. I offered referral to a counselor though she declined at this time. She'll continue to monitor. If worsen she'll let us know.  Arthritis Chronic issue. Patient with arthritis in her bilateral hands. Given distribution in PIP and DIP joints I suspect osteoarthritis. She's not taking any medications for this now. I advised on Tylenol 1000 mg twice daily for the next week to see if this will be beneficial. I advised against anti-inflammatory use given her cardiac history. She will monitor on this regimen.   [name], MD [place]

## 2016-05-23 ENCOUNTER — Telehealth: Payer: Self-pay | Admitting: Family Medicine

## 2016-05-23 NOTE — Telephone Encounter (Signed)
I called pt and left vm to schedule AWV. Thank you! °

## 2016-05-31 ENCOUNTER — Other Ambulatory Visit: Payer: Self-pay | Admitting: Cardiovascular Disease

## 2016-06-03 ENCOUNTER — Other Ambulatory Visit: Payer: Self-pay | Admitting: Cardiovascular Disease

## 2016-06-27 DIAGNOSIS — S83231D Complex tear of medial meniscus, current injury, right knee, subsequent encounter: Secondary | ICD-10-CM | POA: Diagnosis not present

## 2016-06-27 DIAGNOSIS — M1711 Unilateral primary osteoarthritis, right knee: Secondary | ICD-10-CM | POA: Diagnosis not present

## 2016-07-01 ENCOUNTER — Other Ambulatory Visit: Payer: Self-pay | Admitting: Nurse Practitioner

## 2016-07-01 ENCOUNTER — Other Ambulatory Visit: Payer: Self-pay | Admitting: Internal Medicine

## 2016-07-02 NOTE — Telephone Encounter (Signed)
Please advise on refill. Patient has appointment on 07/22/16

## 2016-07-02 NOTE — Telephone Encounter (Signed)
Please advise on refills. Patient has appointment 07/22/16

## 2016-07-02 NOTE — Telephone Encounter (Signed)
Patient of DrSonnenburg

## 2016-07-02 NOTE — Telephone Encounter (Signed)
Can you see why she is on potassium and advair? Does she have asthma or COPD?

## 2016-07-02 NOTE — Telephone Encounter (Signed)
Sent to pharmacy 

## 2016-07-03 NOTE — Telephone Encounter (Signed)
Spoke with patient and she has not been taking the potassium. She does the advair once a day for wheezing.

## 2016-07-03 NOTE — Telephone Encounter (Signed)
Refill of Advair sent to pharmacy.

## 2016-07-22 ENCOUNTER — Ambulatory Visit (INDEPENDENT_AMBULATORY_CARE_PROVIDER_SITE_OTHER): Payer: Medicare Other | Admitting: Family Medicine

## 2016-07-22 ENCOUNTER — Encounter: Payer: Self-pay | Admitting: Family Medicine

## 2016-07-22 VITALS — BP 130/74 | HR 69 | Temp 98.3°F | Wt 137.8 lb

## 2016-07-22 DIAGNOSIS — I200 Unstable angina: Secondary | ICD-10-CM | POA: Diagnosis not present

## 2016-07-22 DIAGNOSIS — F419 Anxiety disorder, unspecified: Secondary | ICD-10-CM

## 2016-07-22 DIAGNOSIS — E785 Hyperlipidemia, unspecified: Secondary | ICD-10-CM | POA: Diagnosis not present

## 2016-07-22 DIAGNOSIS — I25119 Atherosclerotic heart disease of native coronary artery with unspecified angina pectoris: Secondary | ICD-10-CM | POA: Diagnosis not present

## 2016-07-22 MED ORDER — BUSPIRONE HCL 5 MG PO TABS: 7.5000 mg | ORAL_TABLET | Freq: Two times a day (BID) | ORAL | 3 refills | Status: DC

## 2016-07-22 NOTE — Assessment & Plan Note (Signed)
Patient notes rare episodes of exertional chest discomfort. EKG done today reassuring. Has not occurred in the last 3 weeks. Her description is concerning for cardiac cause. We will get her set up for follow-up with her cardiologist to ensure that no further workup needs to be done. She is given return precautions.

## 2016-07-22 NOTE — Progress Notes (Signed)
Pre visit review using our clinic review tool, if applicable. No additional management support is needed unless otherwise documented below in the visit note. 

## 2016-07-22 NOTE — Assessment & Plan Note (Signed)
Continues to have issues with this. Depression as well. Discussed increasing her BuSpar to see if this would be of benefit. We will continue Celexa. BuSpar will now be 7.5 mg twice daily. Advised to half her pills if she can. If she's unable to half her pills to get the 7.5 mg she will contact us and we'll send a new prescription in. She'll continue to monitor.

## 2016-07-22 NOTE — Progress Notes (Signed)
  [name], MD Phone: [phone]  Amy Morton is a 81 y.o. female who presents today for follow-up.  Anxiety/depression: Patient notes having issues dealing with her daughter's terminal metastatic cancer. Her daughter is getting treatment at [facility] and is not doing well. She is currently on BuSpar and Celexa. She notes feeling depressed and anxious. No SI. Is able to talk to a friend that was a former hospice's.  Hyperlipidemia: Taking Lipitor. She gets rare chest pains. No claudication, right upper quadrant pain, or myalgias.  Chest pain: Rarely gets this. Typically occurs with exertion. Occurs centrally. Last occurred 3-4 weeks ago. Takes a nitroglycerin and this relieves it. Has had pain radiating down her left arm previously. Does get short of breath and sweaty with it at times. Has had stents placed in the past.  PMH: nonsmoker.   ROS see history of present illness  Objective  Physical Exam Vitals:   07/22/16 0846  BP: 130/74  Pulse: 69  Temp: 98.3 F (36.8 C)    BP Readings from Last 3 Encounters:  07/22/16 130/74  05/22/16 104/60  05/01/16 130/74   Wt Readings from Last 3 Encounters:  07/22/16 137 lb 12.8 oz (62.5 kg)  05/22/16 137 lb 3.2 oz (62.2 kg)  05/01/16 135 lb (61.2 kg)    Physical Exam  Constitutional: No distress.  HENT:  Mouth/Throat: Oropharynx is clear and moist. No oropharyngeal exudate.  Cardiovascular: Normal rate, regular rhythm and normal heart sounds.   Pulmonary/Chest: Effort normal and breath sounds normal.  Musculoskeletal: She exhibits no edema.  Neurological: She is alert. Gait normal.  Skin: Skin is warm and dry. She is not diaphoretic.  Psychiatric:  Mood depressed, affect flat   EKG: Normal sinus rhythm, rate 63, left axis, no ST or T-wave changes, similar to prior EKG.  Assessment/Plan: Please see individual problem list.  Anxiety Continues to have issues with this. Depression as well. Discussed increasing her  BuSpar to see if this would be of benefit. We will continue Celexa. BuSpar will now be 7.5 mg twice daily. Advised to half her pills if she can. If she's unable to half her pills to get the 7.5 mg she will contact us and we'll send a new prescription in. She'll continue to monitor.  Dyslipidemia, goal LDL below 70 Tolerating Lipitor. Continue this.  CAD (coronary artery disease) Patient notes rare episodes of exertional chest discomfort. EKG done today reassuring. Has not occurred in the last 3 weeks. Her description is concerning for cardiac cause. We will get her set up for follow-up with her cardiologist to ensure that no further workup needs to be done. She is given return precautions.   Orders Placed This Encounter  Procedures  . Ambulatory referral to Cardiology    Referral Priority:   Routine    Referral Type:   Consultation    Referral Reason:   Specialty Services Required    Requested Specialty:   Cardiology    Number of Visits Requested:   1  . EKG 12-Lead    Meds ordered this encounter  Medications  . busPIRone (BUSPAR) 5 MG tablet    Sig: Take 1.5 tablets (7.5 mg total) by mouth 2 (two) times daily.    Dispense:  270 tablet    Refill:  [place], MD [place]

## 2016-07-22 NOTE — Assessment & Plan Note (Signed)
Tolerating Lipitor. Continue this.

## 2016-07-22 NOTE — Patient Instructions (Signed)
Nice to see you. We will increase your BuSpar to 7.5 mg twice daily. You will take 1.5 tablets twice daily. If you develop persistent chest pain, shortness of breath, sweatiness, or any new or change in symptoms please seek medical attention immediately.

## 2016-07-29 DIAGNOSIS — H2513 Age-related nuclear cataract, bilateral: Secondary | ICD-10-CM | POA: Diagnosis not present

## 2016-08-01 ENCOUNTER — Other Ambulatory Visit: Payer: Self-pay | Admitting: Cardiovascular Disease

## 2016-08-04 NOTE — Telephone Encounter (Signed)
Medication Detail    Disp Refills Start End   nitroGLYCERIN (NITROSTAT) 0.4 MG SL tablet 75 tablet 3 05/02/2016    Sig: PLACE 1 TABLET UNDER THE TONGUE EVERY 5 MINUTES AS NEEDED FOR CHEST PAIN, 3 DOSES MAX   E-Prescribing Status: Receipt confirmed by pharmacy (05/02/2016 1:05 PM EDT)   [facility] [id number] - [place], [place]

## 2016-08-05 ENCOUNTER — Ambulatory Visit (INDEPENDENT_AMBULATORY_CARE_PROVIDER_SITE_OTHER): Payer: Medicare Other | Admitting: Cardiovascular Disease

## 2016-08-05 ENCOUNTER — Encounter: Payer: Self-pay | Admitting: Cardiovascular Disease

## 2016-08-05 ENCOUNTER — Encounter (INDEPENDENT_AMBULATORY_CARE_PROVIDER_SITE_OTHER): Payer: Self-pay

## 2016-08-05 VITALS — BP 138/78 | HR 70 | Ht 59.0 in | Wt 139.0 lb

## 2016-08-05 DIAGNOSIS — I2583 Coronary atherosclerosis due to lipid rich plaque: Secondary | ICD-10-CM

## 2016-08-05 DIAGNOSIS — I2510 Atherosclerotic heart disease of native coronary artery without angina pectoris: Secondary | ICD-10-CM

## 2016-08-05 NOTE — Patient Instructions (Signed)
Medication Instructions:  Your physician recommends that you continue on your current medications as directed. Please refer to the Current Medication list given to you today.   Labwork: None Ordered   Testing/Procedures: None Ordered   Follow-Up: Your physician wants you to follow-up in: 6 months with Dr. Nahser.  You will receive a reminder letter in the mail two months in advance. If you don't receive a letter, please call our office to schedule the follow-up appointment.   If you need a refill on your cardiac medications before your next appointment, please call your pharmacy.   Thank you for choosing [facility]! Alaycia Eardley, RN [phone]    

## 2016-08-05 NOTE — Progress Notes (Signed)
Cardiology Office Note   Date:  08/05/2016   ID:  Amy Morton, DOB 18-Jun-1935, MRN [id number]  PCP:  [name], MD  Cardiologist:   Mertie Moores, MD   Chief Complaint  Patient presents with  . Coronary Artery Disease   1. CAD - s/p PTCA - 1996. S/p PCI of her LCX Sept. 2016 2. Hyperlipidemia 3. Anxiety/depression 4.  History of Present Illness:  Amy Morton is a 82 yo with hx of CAD - s/p PCI in 1996 Amy Morton)  She has been seeing Dr. Rosalita Levan. Wanted to changed cardiologist.  She was recently prescribed Abilify and had a bad reaction. She stated that it made her feel quite bad ( chest burning) She has stopped the medication and now feels quite a bit better. She was seen in the ER and had marked HTN. BP is now better.   She walks daily.  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 28, 2016 Amy Morton is a 82 y.o. female who presents for CAD and hyperlipidemia. She has been having exertional CP for 3-4 weeks. Tightness, pressure, mid sternal , radiates out across her chest.  No radiation down arm . Associated with increased dyspnea. Does not have any NTG Has been limiting her exertional activites.  Has trouble getting her mail . Feels very similar to her previous episodes of angina prior to PCI.   Oct. 10, 2016:  Amy Morton is seen back today for her coronary artery disease. I saw her on April 24, 2016  with symptoms of unstable angina. She had a cardiac catheterization by Dr. Irish Lack and was found to have chronic total occlusion of her right coronary artery. The mid LAD had a 70% stenosis. The left circumflex artery had a tight stenosis and had stents placed. The mid LAD stenosis is difficult to approach because of a very sharp bend. She is basically pain free. Has had some sharp pains that woke her from sleep , took a SL NTG and the pain resolved.  Getting regular exercise - walking regualry   November 12, 2015:  She  fell in February and had a consussion.   Still having problems with that.  Vertigo issues.  Also has orthostatic hypotension   Doing well from a cardiac standpoint .   Oct. 5, 2017:  No CP or dyspnea C/o some swelling in legs. ( Appears to be very minimal swelling by exam )   Jan. 9, 2018  Has been having some stress - 53 yo daughter is dying of metastatic  cancer   Has been throwing away her NTG each month  Showed an episode of chest pain when she was under a great deal of stress with her daughter. She's not had any episodes of chest pain with exertion. The episode of chest pain did not feel anything like her previous angina.  Past Medical History:  Diagnosis Date  . Abnormal mammogram 2007   Recommend close follow up  . Anemia    Iron, b12, SPEP, UPEP normal, 12/2012  . Anxiety   . Aortic stenosis    mild to moderate  . aortic stenosis   . Arthritis   . Asthma   . CAD (coronary artery disease)    s/p angioplasty 1996  . Depression   . Elevated LFTs 2010   s/p ultrasound w/ possible fatty liver; GI consult. Resolution 2011  . Gastric ulcer   . GERD (gastroesophageal reflux disease)   .  History of colonic polyps    Dr. Vira Morton  . Hyperlipidemia   . Hypertension   . Peritoneal carcinomatosis [facility]) 2001   Dr. Oliva Bustard & Dr. Claiborne Rigg s/p chemo  . Shingles 2008   T-9 distribution  . Valvular heart disease    Mild to Moderate MR, AS  . Vitamin D deficiency     Past Surgical History:  Procedure Laterality Date  . ABDOMINAL HYSTERECTOMY  2001  . APPENDECTOMY    . CARDIAC CATHETERIZATION  2007   50% mid LAD stenosis, 70% proximal RCA with 100% distal RCA stenosis with collaterals, EF 65%  . CARDIAC CATHETERIZATION N/A 04/27/2015   Procedure: Left Heart Cath and Coronary Angiography;  Surgeon: Jettie Booze, MD;  Location: [facility];  Service: Cardiovascular;  Laterality: N/A;  . CARDIAC CATHETERIZATION N/A 04/27/2015   Procedure: Coronary Stent  Intervention;  Surgeon: Jettie Booze, MD;  Location: [facility];  Service: Cardiovascular;  Laterality: N/A;  . CHOLECYSTECTOMY  1987  . COLECTOMY  2001  . CORONARY ANGIOPLASTY  1996  . LAPAROSCOPIC BILATERAL SALPINGO OOPHERECTOMY  1988   Dr. Laverta Baltimore  . TONSILLECTOMY  1950     Current Outpatient Prescriptions  Medication Sig Dispense Refill  . acetaminophen (TYLENOL) 650 MG CR tablet Take 1,000 mg by mouth 2 (two) times daily.     Marland Kitchen ADVAIR DISKUS 250-50 MCG/DOSE AEPB INHALE 1 PUFF INTO THE LUNGS AS NEEDED FOR SHORTNESS OF BREATH 1 each 1  . albuterol (PROVENTIL HFA;VENTOLIN HFA) 108 (90 BASE) MCG/ACT inhaler Inhale 2 puffs into the lungs every 4 (four) hours as needed for wheezing or shortness of breath.     Marland Kitchen aspirin 81 MG tablet Take 81 mg by mouth daily.    Marland Kitchen atorvastatin (LIPITOR) 80 MG tablet Take 1 tablet (80 mg total) by mouth daily at 6 PM. 90 tablet 1  . busPIRone (BUSPAR) 5 MG tablet Take 1.5 tablets (7.5 mg total) by mouth 2 (two) times daily. 270 tablet 3  . calcium gluconate 650 MG tablet Take 650 mg by mouth 2 (two) times daily.    . Cholecalciferol ([employer] VIT D 5000 HIGH-POTENCY) 5000 UNITS capsule One capsule every other day (Patient taking differently: Take 5,000 Units by mouth every other day. One capsule every other day)    . citalopram (CELEXA) 20 MG tablet Take 1 tablet daily 90 tablet 3  . clopidogrel (PLAVIX) 75 MG tablet Take 1 tablet (75 mg total) by mouth daily with breakfast. 90 tablet 1  . isosorbide mononitrate (IMDUR) 30 MG 24 hr tablet TAKE 1 TABLET(30 MG) BY MOUTH DAILY 30 tablet 11  . metoprolol (LOPRESSOR) 50 MG tablet TAKE 1 TABLET BY MOUTH TWICE DAILY 180 tablet 3  . mirtazapine (REMERON) 7.5 MG tablet Take 1 tablet (7.5 mg total) by mouth at bedtime. 90 tablet 3  . Multiple Vitamin (MULTIVITAMIN) capsule Take 1 capsule by mouth daily.    . nitroGLYCERIN (NITROSTAT) 0.4 MG SL tablet PLACE 1 TABLET UNDER THE TONGUE EVERY 5 MINUTES AS NEEDED FOR  CHEST PAIN, 3 DOSES MAX 75 tablet 3  . omega-3 acid ethyl esters (LOVAZA) 1 g capsule TAKE 2 CAPSULES BY MOUTH TWICE DAILY 360 capsule 3  . pantoprazole (PROTONIX) 40 MG tablet TAKE 1 TABLET(40 MG) BY MOUTH DAILY 30 tablet 11  . Probiotic Product (FLORAJEN BIFIDOBLEND) CAPS Take 1 capsule by mouth daily. 90 capsule 3  . vitamin B-12 (CYANOCOBALAMIN) 250 MCG tablet Take 250 mcg by mouth daily.    Marland Kitchen  [place] 5-2.5-18.5 injection Inject as directed once.  0   No current facility-administered medications for this visit.     Allergies:   Abilify [aripiprazole]    Social History:  The patient  reports that she has never smoked. She has never used smokeless tobacco. She reports that she does not drink alcohol or use drugs.   Family History:  The patient's family history includes Cancer in her father; Cancer (age of onset: 44) in her daughter; Diabetes in her brother and sister; Heart disease in her brother; Hypertension in her mother; Stroke (age of onset: 19) in her mother.    ROS:  Please see the history of present illness.    Review of Systems: Constitutional:  denies fever, chills, diaphoresis, appetite change and fatigue.  HEENT: denies photophobia, eye pain, redness, hearing loss, ear pain, congestion, sore throat, rhinorrhea, sneezing, neck pain, neck stiffness and tinnitus.  Respiratory: denies SOB, DOE, cough, chest tightness, and wheezing.  Cardiovascular: denies chest pain, palpitations and leg swelling.  Gastrointestinal: denies nausea, vomiting, abdominal pain, diarrhea, constipation, blood in stool.  Genitourinary: denies dysuria, urgency, frequency, hematuria, flank pain and difficulty urinating.  Musculoskeletal: denies  myalgias, back pain, joint swelling, arthralgias and gait problem.   Skin: denies pallor, rash and wound.  Neurological: denies dizziness, seizures, syncope, weakness, light-headedness, numbness and headaches.   Hematological: denies adenopathy, easy bruising,  personal or family bleeding history.  Psychiatric/ Behavioral: denies suicidal ideation, mood changes, confusion, nervousness, sleep disturbance and agitation.       All other systems are reviewed and negative.    PHYSICAL EXAM: VS:  BP 138/78 (BP Location: Right Arm, Patient Position: Sitting, Cuff Size: Normal)   Pulse 70   Ht 4\' 11"  (1.499 m)   Wt 139 lb (63 kg)   BMI 28.07 kg/m  , BMI Body mass index is 28.07 kg/m. GEN: Well nourished, well developed, in no acute distress  HEENT: normal  Neck: no JVD, carotid bruits, or masses Cardiac: RRR; 1-2 / 6 systolic murmur, rubs, or gallops, no significant  edema  Respiratory:  clear to auscultation bilaterally, normal work of breathing GI: soft, nontender, nondistended, + BS MS:  Large bruise on right forearm Skin: warm and dry, no rash Neuro:  Strength and sensation are intact Psych: normal   EKG:  EKG is ordered today. The ekg ordered today demonstrates normal sinus rhythm at 61. She has no ST or T wave changes. The EKG is normal.   Recent Labs: 08/23/2015: Pro B Natriuretic peptide (BNP) 164.0 09/24/2015: Hemoglobin 10.1; Platelets 205.0 04/28/2016: ALT 25; BUN 8; Creat 0.73; Potassium 4.3; Sodium 134    Lipid Panel    Component Value Date/Time   CHOL 88 (L) 04/28/2016 1003   CHOL 148 10/19/2014 1212   TRIG 151 (H) 04/28/2016 1003   HDL 42 (L) 04/28/2016 1003   HDL 48 10/19/2014 1212   CHOLHDL 2.1 04/28/2016 1003   VLDL 30 04/28/2016 1003   LDLCALC 16 04/28/2016 1003   LDLCALC 61 10/19/2014 1212      Wt Readings from Last 3 Encounters:  08/05/16 139 lb (63 kg)  07/22/16 137 lb 12.8 oz (62.5 kg)  05/22/16 137 lb 3.2 oz (62.2 kg)      Other studies Reviewed: Additional studies/ records that were reviewed today include:  Review of the above records demonstrates:   ASSESSMENT AND PLAN:  1. Unstable angina  - s/p PTCA - 1996 - unit is doing much better. She status post PCI of  her left circumflex artery  04/26/16.   Marland Kitchen  Dist RCA lesion, 100% stenosed. Known from 2012 catheterization. Short occlusion but heavily calcified. There is a stump if intervention was to be attempted.  Focal Mid LAD-2 lesion, 70% stenosed just after a diagonal at a tortuos segment. The lesion was not previously treated.  Mid Cx lesion, 70% stenosed. There is a 0% residual stenosis post intervention with overlapping DES , Synergy 3.0 x 12 and Synergy 2.5 x 8, all postdilated with a 3.25 balloon. The lesion was not previously treated.  2. Hyperlipidemia -  We'll check fasting labs in 6 months.   3. Anxiety/depression followed by her general medical doctor.  Current medicines are reviewed at length with the patient today.  The patient does not have concerns regarding medicines.  The following changes have been made:  no change  Labs/ tests ordered today include:  Orders Placed This Encounter  Procedures  . EKG 12-Lead     Disposition:   FU with me in 6 months     Signed, Mertie Moores, MD  08/05/2016 4:43 PM    [facility] [place], [place], [place]  [address] Phone: [phone]; Fax: [phone]

## 2016-09-10 DIAGNOSIS — H2513 Age-related nuclear cataract, bilateral: Secondary | ICD-10-CM | POA: Diagnosis not present

## 2016-09-16 ENCOUNTER — Encounter: Payer: Self-pay | Admitting: *Deleted

## 2016-09-25 ENCOUNTER — Encounter: Payer: Self-pay | Admitting: *Deleted

## 2016-09-25 ENCOUNTER — Ambulatory Visit: Payer: Medicare Other | Admitting: Anesthesiology

## 2016-09-25 ENCOUNTER — Ambulatory Visit
Admission: RE | Admit: 2016-09-25 | Discharge: 2016-09-25 | Disposition: A | Discharge status: Home / Self Care | Payer: Medicare Other | Source: Ambulatory Visit | Attending: Ophthalmology | Admitting: Ophthalmology

## 2016-09-25 ENCOUNTER — Encounter
Admission: RE | Disposition: A | Discharge status: Home / Self Care | Payer: Self-pay | Source: Ambulatory Visit | Attending: Ophthalmology

## 2016-09-25 DIAGNOSIS — I10 Essential (primary) hypertension: Secondary | ICD-10-CM | POA: Insufficient documentation

## 2016-09-25 DIAGNOSIS — I252 Old myocardial infarction: Secondary | ICD-10-CM | POA: Diagnosis not present

## 2016-09-25 DIAGNOSIS — M1990 Unspecified osteoarthritis, unspecified site: Secondary | ICD-10-CM | POA: Diagnosis not present

## 2016-09-25 DIAGNOSIS — H2513 Age-related nuclear cataract, bilateral: Secondary | ICD-10-CM | POA: Diagnosis not present

## 2016-09-25 DIAGNOSIS — J45909 Unspecified asthma, uncomplicated: Secondary | ICD-10-CM | POA: Insufficient documentation

## 2016-09-25 DIAGNOSIS — K219 Gastro-esophageal reflux disease without esophagitis: Secondary | ICD-10-CM | POA: Insufficient documentation

## 2016-09-25 DIAGNOSIS — Z955 Presence of coronary angioplasty implant and graft: Secondary | ICD-10-CM | POA: Diagnosis not present

## 2016-09-25 DIAGNOSIS — F329 Major depressive disorder, single episode, unspecified: Secondary | ICD-10-CM | POA: Insufficient documentation

## 2016-09-25 DIAGNOSIS — F419 Anxiety disorder, unspecified: Secondary | ICD-10-CM | POA: Insufficient documentation

## 2016-09-25 DIAGNOSIS — H2512 Age-related nuclear cataract, left eye: Secondary | ICD-10-CM | POA: Diagnosis not present

## 2016-09-25 DIAGNOSIS — I2510 Atherosclerotic heart disease of native coronary artery without angina pectoris: Secondary | ICD-10-CM | POA: Diagnosis not present

## 2016-09-25 HISTORY — PX: CATARACT EXTRACTION W/PHACO: SHX586

## 2016-09-25 HISTORY — DX: Dyspnea, unspecified: R06.00

## 2016-09-25 HISTORY — DX: Diverticulosis of intestine, part unspecified, without perforation or abscess without bleeding: K57.90

## 2016-09-25 HISTORY — DX: Chronic cough: R05.3

## 2016-09-25 HISTORY — DX: Unspecified hearing loss, unspecified ear: H91.90

## 2016-09-25 HISTORY — DX: Wheezing: R06.2

## 2016-09-25 HISTORY — DX: Cough: R05

## 2016-09-25 SURGERY — PHACOEMULSIFICATION, CATARACT, WITH IOL INSERTION
Anesthesia: Monitor Anesthesia Care | Site: Eye | Laterality: Left | Wound class: Clean

## 2016-09-25 MED ORDER — CARBACHOL 0.01 % IO SOLN
INTRAOCULAR | Status: DC | PRN
  Administered 2016-09-25: 0.5 mL via INTRAOCULAR

## 2016-09-25 MED ORDER — SODIUM HYALURONATE 23 MG/ML IO SOLN
INTRAOCULAR | Status: AC
  Filled 2016-09-25: qty 0.6

## 2016-09-25 MED ORDER — SODIUM CHLORIDE 0.9 % IV SOLN
INTRAVENOUS | Status: DC
  Administered 2016-09-25 (×2): via INTRAVENOUS

## 2016-09-25 MED ORDER — ARMC OPHTHALMIC DILATING DROPS
1.0000 "application " | OPHTHALMIC | Status: AC
  Administered 2016-09-25 (×2): 1 via OPHTHALMIC

## 2016-09-25 MED ORDER — EPINEPHRINE PF 1 MG/ML IJ SOLN
INTRAOCULAR | Status: DC | PRN
  Administered 2016-09-25: 10:00:00 via OPHTHALMIC

## 2016-09-25 MED ORDER — ARMC OPHTHALMIC DILATING DROPS
OPHTHALMIC | Status: AC
  Filled 2016-09-25: qty 0.4

## 2016-09-25 MED ORDER — EPINEPHRINE PF 1 MG/ML IJ SOLN
INTRAMUSCULAR | Status: AC
  Filled 2016-09-25: qty 1

## 2016-09-25 MED ORDER — MOXIFLOXACIN HCL 0.5 % OP SOLN: 1.0000 [drp] | OPHTHALMIC | Status: DC | PRN

## 2016-09-25 MED ORDER — ALFENTANIL 500 MCG/ML IJ INJ
INJECTION | INTRAMUSCULAR | Status: DC | PRN
Start: 2016-09-25 — End: 2016-09-25
  Administered 2016-09-25: 250 ug via INTRAVENOUS

## 2016-09-25 MED ORDER — BSS IO SOLN
INTRAOCULAR | Status: DC | PRN
  Administered 2016-09-25: 4 mL via OPHTHALMIC

## 2016-09-25 MED ORDER — POVIDONE-IODINE 5 % OP SOLN
OPHTHALMIC | Status: AC
  Filled 2016-09-25: qty 30

## 2016-09-25 MED ORDER — MOXIFLOXACIN HCL 0.5 % OP SOLN
OPHTHALMIC | Status: AC
  Filled 2016-09-25: qty 3

## 2016-09-25 MED ORDER — MOXIFLOXACIN HCL 0.5 % OP SOLN
OPHTHALMIC | Status: DC | PRN
  Administered 2016-09-25: 0.2 mL via OPHTHALMIC

## 2016-09-25 MED ORDER — MIDAZOLAM HCL 2 MG/2ML IJ SOLN
INTRAMUSCULAR | Status: AC
  Filled 2016-09-25: qty 2

## 2016-09-25 MED ORDER — POVIDONE-IODINE 5 % OP SOLN
OPHTHALMIC | Status: DC | PRN
  Administered 2016-09-25: 1 via OPHTHALMIC

## 2016-09-25 MED ORDER — SODIUM HYALURONATE 23 MG/ML IO SOLN
INTRAOCULAR | Status: DC | PRN
  Administered 2016-09-25: 0.6 mL via INTRAOCULAR

## 2016-09-25 MED ORDER — MIDAZOLAM HCL 2 MG/2ML IJ SOLN
INTRAMUSCULAR | Status: DC | PRN
  Administered 2016-09-25 (×2): 0.5 mg via INTRAVENOUS

## 2016-09-25 MED ORDER — SODIUM HYALURONATE 10 MG/ML IO SOLN
INTRAOCULAR | Status: DC | PRN
  Administered 2016-09-25: 0.85 mL via INTRAOCULAR

## 2016-09-25 MED ORDER — SODIUM HYALURONATE 10 MG/ML IO SOLN
INTRAOCULAR | Status: AC
  Filled 2016-09-25: qty 0.85

## 2016-09-25 SURGICAL SUPPLY — 20 items
CANNULA ANT/CHMB 27GA (MISCELLANEOUS) ×6 IMPLANT
CUP MEDICINE 2OZ PLAST GRAD ST (MISCELLANEOUS) ×3 IMPLANT
DISSECTOR HYDRO NUCLEUS 50X22 (MISCELLANEOUS) ×3 IMPLANT
GLOVE BIO SURGEON STRL SZ8 (GLOVE) ×3 IMPLANT
GLOVE BIOGEL M 6.5 STRL (GLOVE) ×3 IMPLANT
GLOVE SURG LX 7.5 STRW (GLOVE) ×2
GLOVE SURG LX STRL 7.5 STRW (GLOVE) ×1 IMPLANT
GOWN STRL REUS W/ TWL LRG LVL3 (GOWN DISPOSABLE) ×2 IMPLANT
GOWN STRL REUS W/TWL LRG LVL3 (GOWN DISPOSABLE) ×4
LENS IOL TECNIS ITEC 19.0 (Intraocular Lens) ×3 IMPLANT
PACK CATARACT (MISCELLANEOUS) ×3 IMPLANT
PACK CATARACT KING (MISCELLANEOUS) ×3 IMPLANT
PACK EYE AFTER SURG (MISCELLANEOUS) ×3 IMPLANT
SOL BSS BAG (MISCELLANEOUS) ×3
SOLUTION BSS BAG (MISCELLANEOUS) ×1 IMPLANT
SYR 3ML LL SCALE MARK (SYRINGE) ×6 IMPLANT
SYR 5ML LL (SYRINGE) ×3 IMPLANT
SYR TB 1ML 27GX1/2 LL (SYRINGE) ×3 IMPLANT
WATER STERILE IRR 250ML POUR (IV SOLUTION) ×3 IMPLANT
WIPE NON LINTING 3.25X3.25 (MISCELLANEOUS) ×3 IMPLANT

## 2016-09-25 NOTE — H&P (Signed)
The History and Physical notes are on paper, have been signed, and are to be scanned.   I have examined the patient and there are no changes to the H&P.   Amy Morton 09/25/2016 10:02 AM

## 2016-09-25 NOTE — Op Note (Signed)
OPERATIVE NOTE  Amy Morton [id number] 09/25/2016   PREOPERATIVE DIAGNOSIS:  Nuclear sclerotic cataract left eye.  H25.12   POSTOPERATIVE DIAGNOSIS:    Nuclear sclerotic cataract left eye.     PROCEDURE:  Phacoemusification with posterior chamber intraocular lens placement of the left eye   LENS:   Implant Name Type Inv. Item Serial No. Manufacturer Lot No. LRB No. Used  LENS IOL DIOP 19.0 - [id number] Intraocular Lens LENS IOL DIOP 19.0 [id number]   Left 1       PCB00 +19.0   ULTRASOUND TIME: 0 minutes 46.7 seconds.  CDE 3.80   SURGEON:  Benay Pillow, MD, MPH   ANESTHESIA:  Topical with tetracaine drops augmented with 1% preservative-free intracameral lidocaine.  ESTIMATED BLOOD LOSS: <1 mL   COMPLICATIONS:  None.   DESCRIPTION OF PROCEDURE:  The patient was identified in the holding room and transported to the operating room and placed in the supine position under the operating microscope.  The left eye was identified as the operative eye and it was prepped and draped in the usual sterile ophthalmic fashion.   A 1.0 millimeter clear-corneal paracentesis was made at the 5:00 position. 0.5 ml of preservative-free 1% lidocaine with epinephrine was injected into the anterior chamber.  The anterior chamber was filled with Healon 5 viscoelastic.  A 2.4 millimeter keratome was used to make a near-clear corneal incision at the 2:00 position.  A curvilinear capsulorrhexis was made with a cystotome and capsulorrhexis forceps.  Balanced salt solution was used to hydrodissect and hydrodelineate the nucleus.   Phacoemulsification was then used in stop and chop fashion to remove the lens nucleus and epinucleus.  The remaining cortex was then removed using the irrigation and aspiration handpiece. Healon was then placed into the capsular bag to distend it for lens placement.  A lens was then injected into the capsular bag.  The remaining viscoelastic was aspirated.   Wounds were  hydrated with balanced salt solution.  The anterior chamber was inflated to a physiologic pressure with balanced salt solution.  Intracameral vigamox 0.1 mL undiltued was injected into the eye and a drop placed onto the ocular surface.  No wound leaks were noted.  The patient was taken to the recovery room in stable condition without complications of anesthesia or surgery  Benay Pillow 09/25/2016, 10:38 AM

## 2016-09-25 NOTE — Discharge Instructions (Signed)
Eye Surgery Discharge Instructions  Expect mild scratchy sensation or mild soreness. DO NOT RUB YOUR EYE!  The day of surgery:  Minimal physical activity, but bed rest is not required  No reading, computer work, or close hand work  No bending, lifting, or straining.  May watch TV  For 24 hours:  No driving, legal decisions, or alcoholic beverages  Safety precautions  Eat anything you prefer: It is better to start with liquids, then soup then solid foods.  _____ Eye patch should be worn until postoperative exam tomorrow.  ____ Solar shield eyeglasses should be worn for comfort in the sunlight/patch while sleeping  Resume all regular medications including aspirin or Coumadin if these were discontinued prior to surgery. You may shower, bathe, shave, or wash your hair. Tylenol may be taken for mild discomfort.  Call your doctor if you experience significant pain, nausea, or vomiting, fever > 101 or other signs of infection. [phone] or [phone] Specific instructions:  Follow-up Information    Benay Pillow, MD Follow up.   Specialty:  Ophthalmology Why:  March 2 at 10:15am Contact information: [address] [place] [address] [phone]

## 2016-09-25 NOTE — Anesthesia Postprocedure Evaluation (Signed)
Anesthesia Post Note  Patient: Amy Morton  Procedure(s) Performed: Procedure(s) (LRB): CATARACT EXTRACTION PHACO AND INTRAOCULAR LENS PLACEMENT (IOC) (Left)  Patient location during evaluation: PACU Anesthesia Type: MAC Level of consciousness: awake and alert Pain management: pain level controlled Vital Signs Assessment: post-procedure vital signs reviewed and stable Respiratory status: spontaneous breathing, nonlabored ventilation, respiratory function stable and patient connected to nasal cannula oxygen Cardiovascular status: stable and blood pressure returned to baseline Anesthetic complications: no     Last Vitals:  Vitals:   09/25/16 1044 09/25/16 1100  BP: 131/61 (!) 142/58  Pulse: (!) 58   Resp: 16   Temp: 36.9 C     Last Pain:  Vitals:   09/25/16 1041  TempSrc: Temporal                 Precious Haws Myiah Petkus

## 2016-09-25 NOTE — Transfer of Care (Signed)
Immediate Anesthesia Transfer of Care Note  Patient: Amy Morton  Procedure(s) Performed: Procedure(s) with comments: CATARACT EXTRACTION PHACO AND INTRAOCULAR LENS PLACEMENT (IOC) (Left) - [place] 46.7 AP% 8.1 CDE 3.80 Fluid pack lot # [id number]  Patient Location: PACU and Short Stay  Anesthesia Type:MAC  Level of Consciousness: awake, oriented and patient cooperative  Airway & Oxygen Therapy: Patient Spontanous Breathing  Post-op Assessment: Report given to RN and Post -op Vital signs reviewed and stable  Post vital signs: Reviewed and stable  Last Vitals:  Vitals:   09/16/16 1331 09/25/16 0827  BP: 121/65 124/64  Pulse: 65 64  Resp:  18  Temp:  36.7 C    Last Pain:  Vitals:   09/25/16 0827  TempSrc: Oral         Complications: No apparent anesthesia complications

## 2016-09-25 NOTE — Anesthesia Post-op Follow-up Note (Cosign Needed)
Anesthesia QCDR form completed.        

## 2016-09-25 NOTE — Anesthesia Preprocedure Evaluation (Signed)
Anesthesia Evaluation  Patient identified by MRN, date of birth, ID band Patient awake    Reviewed: Allergy & Precautions, H&P , NPO status , Patient's Chart, lab work & pertinent test results  Airway Mallampati: III  TM Distance: <3 FB Neck ROM: limited    Dental  (+) Poor Dentition, Missing, Upper Dentures, Lower Dentures   Pulmonary shortness of breath and with exertion, asthma ,    Pulmonary exam normal breath sounds clear to auscultation       Cardiovascular Exercise Tolerance: Good hypertension, (-) angina+ CAD and + Cardiac Stents  (-) Past MI Normal cardiovascular exam+ Valvular Problems/Murmurs AS  Rhythm:regular Rate:Normal     Neuro/Psych PSYCHIATRIC DISORDERS Anxiety Depression negative neurological ROS     GI/Hepatic Neg liver ROS, PUD, GERD  Controlled,  Endo/Other  negative endocrine ROS  Renal/GU      Musculoskeletal  (+) Arthritis ,   Abdominal   Peds  Hematology negative hematology ROS (+)   Anesthesia Other Findings Past Medical History: 2007: Abnormal mammogram     Comment: Recommend close follow up No date: Anemia     Comment: Iron, b12, SPEP, UPEP normal, 12/2012 No date: Anxiety No date: Aortic stenosis     Comment: mild to moderate No date: aortic stenosis No date: Arthritis No date: Asthma No date: CAD (coronary artery disease)     Comment: s/p angioplasty 1996 No date: Chronic cough No date: Depression No date: Diverticulosis No date: Dyspnea 2010: Elevated LFTs     Comment: s/p ultrasound w/ possible fatty liver; GI               consult. Resolution 2011 No date: Gastric ulcer No date: GERD (gastroesophageal reflux disease) No date: History of colonic polyps     Comment: Dr. Elliott No date: HOH (hard of hearing) No date: Hyperlipidemia No date: Hypertension 2001: Peritoneal carcinomatosis (HCC)     Comment: Dr. Choksi & Dr. Bridget Miller s/p chemo 2008: Shingles  Comment: T-9 distribution No date: Valvular heart disease     Comment: Mild to Moderate MR, AS No date: Vitamin D deficiency No date: Wheezing  Past Surgical History: 2001: ABDOMINAL HYSTERECTOMY No date: APPENDECTOMY 2007: CARDIAC CATHETERIZATION     Comment: 50% mid LAD stenosis, 70% proximal RCA with               100% distal RCA stenosis with collaterals, EF               65% 04/27/2015: CARDIAC CATHETERIZATION N/A     Comment: Procedure: Left Heart Cath and Coronary               Angiography;  Surgeon: Jayadeep S Varanasi, MD;              Location: [facility];  Service:               Cardiovascular;  Laterality: N/A; 04/27/2015: CARDIAC CATHETERIZATION N/A     Comment: Procedure: Coronary Stent Intervention;                Surgeon: Jayadeep S Varanasi, MD;  Location: [facility]              INVASIVE CV LAB;  Service: Cardiovascular;                Laterality: N/A; 1987: CHOLECYSTECTOMY 2001: COLECTOMY 1996: CORONARY ANGIOPLASTY No date: CORONARY ANGIOPLASTY 1988: LAPAROSCOPIC BILATERAL SALPINGO OOPHERECTOMY     Comment: Dr. Long 1950: TONSILLECTOMY  BMI      Body Mass Index:  28.07 kg/m      Reproductive/Obstetrics negative OB ROS                             Anesthesia Physical Anesthesia Plan  ASA: III  Anesthesia Plan: MAC   Post-op Pain Management:    Induction:   Airway Management Planned:   Additional Equipment:   Intra-op Plan:   Post-operative Plan:   Informed Consent: I have reviewed the patients History and Physical, chart, labs and discussed the procedure including the risks, benefits and alternatives for the proposed anesthesia with the patient or authorized representative who has indicated his/her understanding and acceptance.     Plan Discussed with: Anesthesiologist, CRNA and Surgeon  Anesthesia Plan Comments:         Anesthesia Quick Evaluation

## 2016-10-07 ENCOUNTER — Other Ambulatory Visit: Payer: Self-pay | Admitting: Cardiovascular Disease

## 2016-10-20 ENCOUNTER — Encounter: Payer: Self-pay | Admitting: Family Medicine

## 2016-10-20 ENCOUNTER — Ambulatory Visit (INDEPENDENT_AMBULATORY_CARE_PROVIDER_SITE_OTHER): Payer: Medicare Other | Admitting: Family Medicine

## 2016-10-20 VITALS — BP 126/80 | HR 63 | Temp 98.3°F | Wt 143.0 lb

## 2016-10-20 DIAGNOSIS — R42 Dizziness and giddiness: Secondary | ICD-10-CM

## 2016-10-20 DIAGNOSIS — E785 Hyperlipidemia, unspecified: Secondary | ICD-10-CM | POA: Diagnosis not present

## 2016-10-20 DIAGNOSIS — I2510 Atherosclerotic heart disease of native coronary artery without angina pectoris: Secondary | ICD-10-CM | POA: Diagnosis not present

## 2016-10-20 DIAGNOSIS — I2583 Coronary atherosclerosis due to lipid rich plaque: Secondary | ICD-10-CM

## 2016-10-20 DIAGNOSIS — I951 Orthostatic hypotension: Secondary | ICD-10-CM | POA: Insufficient documentation

## 2016-10-20 DIAGNOSIS — D649 Anemia, unspecified: Secondary | ICD-10-CM

## 2016-10-20 DIAGNOSIS — C482 Malignant neoplasm of peritoneum, unspecified: Secondary | ICD-10-CM

## 2016-10-20 LAB — CBC
HCT: 35.5 % — ABNORMAL LOW (ref 36.0–46.0)
Hemoglobin: 12 g/dL (ref 12.0–15.0)
MCHC: 33.8 g/dL (ref 30.0–36.0)
MCV: 92.5 fl (ref 78.0–100.0)
Platelets: 186 10*3/uL (ref 150.0–400.0)
RBC: 3.83 Mil/uL — ABNORMAL LOW (ref 3.87–5.11)
RDW: 12.8 % (ref 11.5–15.5)
WBC: 7.1 10*3/uL (ref 4.0–10.5)

## 2016-10-20 LAB — COMPREHENSIVE METABOLIC PANEL
ALT: 33 U/L (ref 0–35)
AST: 35 U/L (ref 0–37)
Albumin: 4.4 g/dL (ref 3.5–5.2)
Alkaline Phosphatase: 42 U/L (ref 39–117)
BUN: 19 mg/dL (ref 6–23)
CO2: 26 mEq/L (ref 19–32)
Calcium: 9.4 mg/dL (ref 8.4–10.5)
Chloride: 103 mEq/L (ref 96–112)
Creatinine, Ser: 0.71 mg/dL (ref 0.40–1.20)
GFR: 84.29 mL/min (ref >60.00)
Glucose, Bld: 117 mg/dL — ABNORMAL HIGH (ref 70–99)
Potassium: 4.6 mEq/L (ref 3.5–5.1)
Sodium: 135 mEq/L (ref 135–145)
Total Bilirubin: 0.5 mg/dL (ref 0.2–1.2)
Total Protein: 7.3 g/dL (ref 6.0–8.3)

## 2016-10-20 NOTE — Assessment & Plan Note (Signed)
She'll continue Lipitor.

## 2016-10-20 NOTE — Assessment & Plan Note (Signed)
Asymptomatic. She'll continue her current medications. Discussed appropriate use of nitroglycerin.

## 2016-10-20 NOTE — Progress Notes (Signed)
  Amy Rumps, MD Phone: [phone]  Amy Morton is a 82 y.o. female who presents today for f/u.  Hyperlipidemia: She notes no chest pain or shortness of breath. No claudication. She's taking Lipitor.  CAD: Has stents in place. No chest pain. No shortness of breath. She takes metoprolol, Plavix, and aspirin. Followed by cardiology. She reports no use of nitroglycerin since her last visit. She saw cardiology in follow-up.  Anemia: Anemia had been stable on last check about a year ago. She does not take an iron supplement. She notes no blood in her stool. She does get a little lightheaded if she stands up too quickly though this is rare.  She has a history of peritoneal carcinoma. Underwent chemotherapy and removal of one third of her colon. This was in 2001. She has not had any issues since then. No abdominal pain.  PMH: nonsmoker.   ROS see history of present illness  Objective  Physical Exam Vitals:   10/20/16 1059  BP: 126/80  Pulse: 63  Temp: 98.3 F (36.8 C)   Laying blood pressure 150/80 pulse 63 Sitting blood pressure 130/70 pulse 65 Standing blood pressure 148/68 pulse 67  BP Readings from Last 3 Encounters:  10/20/16 126/80  09/25/16 (!) 142/58  08/05/16 138/78   Wt Readings from Last 3 Encounters:  10/20/16 143 lb (64.9 kg)  09/16/16 139 lb (63 kg)  08/05/16 139 lb (63 kg)    Physical Exam  Constitutional: No distress.  Cardiovascular: Normal rate, regular rhythm and normal heart sounds.   Pulmonary/Chest: Effort normal and breath sounds normal.  Abdominal: Soft. Bowel sounds are normal. She exhibits no distension. There is no tenderness. There is no rebound and no guarding.  Musculoskeletal: She exhibits no edema.  Neurological: She is alert. Gait normal.  Skin: Skin is warm and dry. She is not diaphoretic.     Assessment/Plan: Please see individual problem list.  CAD (coronary artery disease) Asymptomatic. She'll continue her current  medications. Discussed appropriate use of nitroglycerin.  Peritoneal carcinoma No abdominal pain. No issues with this in many years. She'll continue to monitor.  Anemia Check CBC. Minimal intermittent rare lightheadedness likely related to orthostasis. No other symptoms. Denies blood in her stool.  Orthostatic lightheadedness Patient is barely orthostatic on orthostatic vital signs. Suspect her lightheadedness is related to this. Encouraged her to rise slowly. She needs to stay well hydrated. She'll continue to monitor. If it becomes more frequent she will let us know.  Dyslipidemia, goal LDL below 70 She'll continue Lipitor.   Orders Placed This Encounter  Procedures  . Comp Met (CMET)  . CBC   Amy Rumps, MD [place]

## 2016-10-20 NOTE — Patient Instructions (Signed)
Nice to see you. We will reck lab work and call you with the results. Please rise slowly from seated positions. You need to stay well hydrated as well. If you get persistently lightheaded please seek medical attention.

## 2016-10-20 NOTE — Assessment & Plan Note (Signed)
Check CBC. Minimal intermittent rare lightheadedness likely related to orthostasis. No other symptoms. Denies blood in her stool.

## 2016-10-20 NOTE — Progress Notes (Signed)
Pre visit review using our clinic review tool, if applicable. No additional management support is needed unless otherwise documented below in the visit note. 

## 2016-10-20 NOTE — Assessment & Plan Note (Signed)
No abdominal pain. No issues with this in many years. She'll continue to monitor.

## 2016-10-20 NOTE — Assessment & Plan Note (Signed)
Patient is barely orthostatic on orthostatic vital signs. Suspect her lightheadedness is related to this. Encouraged her to rise slowly. She needs to stay well hydrated. She'll continue to monitor. If it becomes more frequent she will let us know.

## 2016-10-22 ENCOUNTER — Telehealth: Payer: Self-pay

## 2016-10-22 NOTE — Telephone Encounter (Signed)
-----   Message from [name], MD sent at 10/21/2016  6:47 PM EDT ----- Please with the patient know her labs were overall acceptable. Her blood sugar is minimally elevated and we will need to check for diabetes in the future. Her other lab work is fine. Thanks.

## 2016-10-22 NOTE — Telephone Encounter (Signed)
Patient notified

## 2016-10-22 NOTE — Telephone Encounter (Signed)
pt called back returning your call. Thank you!  Call pt @ [phone]

## 2016-10-22 NOTE — Telephone Encounter (Signed)
Left message to return call 

## 2016-10-30 DIAGNOSIS — H2511 Age-related nuclear cataract, right eye: Secondary | ICD-10-CM | POA: Diagnosis not present

## 2016-11-03 ENCOUNTER — Encounter: Payer: Self-pay | Admitting: *Deleted

## 2016-11-05 MED ORDER — ARMC OPHTHALMIC DILATING DROPS
1.0000 "application " | OPHTHALMIC | Status: AC
  Administered 2016-11-06 (×3): 1 via OPHTHALMIC

## 2016-11-05 MED ORDER — MOXIFLOXACIN HCL 0.5 % OP SOLN: 1.0000 [drp] | OPHTHALMIC | Status: DC | PRN

## 2016-11-06 ENCOUNTER — Ambulatory Visit: Payer: Medicare Other | Admitting: Certified Registered Nurse Anesthetist

## 2016-11-06 ENCOUNTER — Encounter
Admission: RE | Disposition: A | Discharge status: Home / Self Care | Payer: Self-pay | Source: Ambulatory Visit | Attending: Ophthalmology

## 2016-11-06 ENCOUNTER — Ambulatory Visit
Admission: RE | Admit: 2016-11-06 | Discharge: 2016-11-06 | Disposition: A | Discharge status: Home / Self Care | Payer: Medicare Other | Source: Ambulatory Visit | Attending: Ophthalmology | Admitting: Ophthalmology

## 2016-11-06 ENCOUNTER — Encounter: Payer: Self-pay | Admitting: *Deleted

## 2016-11-06 DIAGNOSIS — Z7902 Long term (current) use of antithrombotics/antiplatelets: Secondary | ICD-10-CM | POA: Insufficient documentation

## 2016-11-06 DIAGNOSIS — Z79899 Other long term (current) drug therapy: Secondary | ICD-10-CM | POA: Insufficient documentation

## 2016-11-06 DIAGNOSIS — Z955 Presence of coronary angioplasty implant and graft: Secondary | ICD-10-CM | POA: Insufficient documentation

## 2016-11-06 DIAGNOSIS — I2510 Atherosclerotic heart disease of native coronary artery without angina pectoris: Secondary | ICD-10-CM | POA: Diagnosis not present

## 2016-11-06 DIAGNOSIS — Z7982 Long term (current) use of aspirin: Secondary | ICD-10-CM | POA: Insufficient documentation

## 2016-11-06 DIAGNOSIS — F329 Major depressive disorder, single episode, unspecified: Secondary | ICD-10-CM | POA: Diagnosis not present

## 2016-11-06 DIAGNOSIS — H269 Unspecified cataract: Secondary | ICD-10-CM | POA: Diagnosis not present

## 2016-11-06 DIAGNOSIS — H2511 Age-related nuclear cataract, right eye: Secondary | ICD-10-CM | POA: Insufficient documentation

## 2016-11-06 DIAGNOSIS — Z7951 Long term (current) use of inhaled steroids: Secondary | ICD-10-CM | POA: Diagnosis not present

## 2016-11-06 DIAGNOSIS — K219 Gastro-esophageal reflux disease without esophagitis: Secondary | ICD-10-CM | POA: Insufficient documentation

## 2016-11-06 DIAGNOSIS — F418 Other specified anxiety disorders: Secondary | ICD-10-CM | POA: Diagnosis not present

## 2016-11-06 DIAGNOSIS — I10 Essential (primary) hypertension: Secondary | ICD-10-CM | POA: Insufficient documentation

## 2016-11-06 DIAGNOSIS — E7800 Pure hypercholesterolemia, unspecified: Secondary | ICD-10-CM | POA: Diagnosis not present

## 2016-11-06 HISTORY — PX: CATARACT EXTRACTION W/PHACO: SHX586

## 2016-11-06 SURGERY — PHACOEMULSIFICATION, CATARACT, WITH IOL INSERTION
Anesthesia: Monitor Anesthesia Care | Site: Eye | Laterality: Right | Wound class: Clean

## 2016-11-06 MED ORDER — ONDANSETRON HCL 4 MG/2ML IJ SOLN: 4.0000 mg | Freq: Once | INTRAMUSCULAR | Status: DC | PRN

## 2016-11-06 MED ORDER — MOXIFLOXACIN HCL 0.5 % OP SOLN
OPHTHALMIC | Status: DC | PRN
Start: 2016-11-06 — End: 2016-11-06
  Administered 2016-11-06: 1 [drp] via OPHTHALMIC

## 2016-11-06 MED ORDER — SODIUM CHLORIDE 0.9 % IV SOLN
INTRAVENOUS | Status: DC
  Administered 2016-11-06: 08:00:00 via INTRAVENOUS

## 2016-11-06 MED ORDER — FENTANYL CITRATE (PF) 100 MCG/2ML IJ SOLN: 25.0000 ug | INTRAMUSCULAR | Status: DC | PRN

## 2016-11-06 MED ORDER — LIDOCAINE HCL (PF) 4 % IJ SOLN
INTRAOCULAR | Status: DC | PRN
  Administered 2016-11-06: 4 mL via OPHTHALMIC

## 2016-11-06 MED ORDER — MIDAZOLAM HCL 2 MG/2ML IJ SOLN
INTRAMUSCULAR | Status: AC
  Filled 2016-11-06: qty 2

## 2016-11-06 MED ORDER — FENTANYL CITRATE (PF) 100 MCG/2ML IJ SOLN
INTRAMUSCULAR | Status: AC
  Filled 2016-11-06: qty 2

## 2016-11-06 MED ORDER — SODIUM HYALURONATE 10 MG/ML IO SOLN
INTRAOCULAR | Status: AC
  Filled 2016-11-06: qty 0.85

## 2016-11-06 MED ORDER — MIDAZOLAM HCL 2 MG/2ML IJ SOLN
INTRAMUSCULAR | Status: DC | PRN
  Administered 2016-11-06 (×2): 0.5 mg via INTRAVENOUS

## 2016-11-06 MED ORDER — LIDOCAINE HCL (PF) 2 % IJ SOLN
INTRAMUSCULAR | Status: AC
  Filled 2016-11-06: qty 2

## 2016-11-06 MED ORDER — SODIUM HYALURONATE 10 MG/ML IO SOLN
INTRAOCULAR | Status: DC | PRN
  Administered 2016-11-06: 0.55 mL via INTRAOCULAR

## 2016-11-06 MED ORDER — MOXIFLOXACIN HCL 0.5 % OP SOLN
OPHTHALMIC | Status: AC
  Filled 2016-11-06: qty 3

## 2016-11-06 MED ORDER — EPINEPHRINE PF 1 MG/ML IJ SOLN
INTRAMUSCULAR | Status: AC
  Filled 2016-11-06: qty 2

## 2016-11-06 MED ORDER — EPINEPHRINE PF 1 MG/ML IJ SOLN
INTRAOCULAR | Status: DC | PRN
  Administered 2016-11-06: 200 mL via OPHTHALMIC

## 2016-11-06 MED ORDER — FENTANYL CITRATE (PF) 100 MCG/2ML IJ SOLN
INTRAMUSCULAR | Status: DC | PRN
  Administered 2016-11-06: 25 ug via INTRAVENOUS

## 2016-11-06 MED ORDER — SODIUM HYALURONATE 23 MG/ML IO SOLN
INTRAOCULAR | Status: DC | PRN
Start: 2016-11-06 — End: 2016-11-06
  Administered 2016-11-06: 0.6 mL via INTRAOCULAR

## 2016-11-06 MED ORDER — SODIUM HYALURONATE 23 MG/ML IO SOLN
INTRAOCULAR | Status: AC
  Filled 2016-11-06: qty 0.6

## 2016-11-06 MED ORDER — POVIDONE-IODINE 5 % OP SOLN
OPHTHALMIC | Status: AC
  Filled 2016-11-06: qty 30

## 2016-11-06 MED ORDER — POVIDONE-IODINE 5 % OP SOLN
OPHTHALMIC | Status: DC | PRN
  Administered 2016-11-06: 1 via OPHTHALMIC

## 2016-11-06 MED ORDER — ARMC OPHTHALMIC DILATING DROPS
OPHTHALMIC | Status: AC
  Administered 2016-11-06: 1 via OPHTHALMIC
  Filled 2016-11-06: qty 0.4

## 2016-11-06 SURGICAL SUPPLY — 20 items
CANNULA ANT/CHMB 27GA (MISCELLANEOUS) ×6 IMPLANT
CUP MEDICINE 2OZ PLAST GRAD ST (MISCELLANEOUS) ×3 IMPLANT
DISSECTOR HYDRO NUCLEUS 50X22 (MISCELLANEOUS) ×3 IMPLANT
GLOVE BIO SURGEON STRL SZ8 (GLOVE) ×3 IMPLANT
GLOVE BIOGEL M 6.5 STRL (GLOVE) ×3 IMPLANT
GLOVE SURG LX 7.5 STRW (GLOVE) ×2
GLOVE SURG LX STRL 7.5 STRW (GLOVE) ×1 IMPLANT
GOWN STRL REUS W/ TWL LRG LVL3 (GOWN DISPOSABLE) ×2 IMPLANT
GOWN STRL REUS W/TWL LRG LVL3 (GOWN DISPOSABLE) ×4
LENS IOL TECNIS ITEC 20.0 (Intraocular Lens) ×3 IMPLANT
PACK CATARACT (MISCELLANEOUS) ×3 IMPLANT
PACK CATARACT KING (MISCELLANEOUS) ×3 IMPLANT
PACK EYE AFTER SURG (MISCELLANEOUS) ×3 IMPLANT
SOL BSS BAG (MISCELLANEOUS) ×3
SOLUTION BSS BAG (MISCELLANEOUS) ×1 IMPLANT
SYR 3ML LL SCALE MARK (SYRINGE) ×6 IMPLANT
SYR 5ML LL (SYRINGE) ×3 IMPLANT
SYR TB 1ML 27GX1/2 LL (SYRINGE) ×3 IMPLANT
WATER STERILE IRR 250ML POUR (IV SOLUTION) ×3 IMPLANT
WIPE NON LINTING 3.25X3.25 (MISCELLANEOUS) ×3 IMPLANT

## 2016-11-06 NOTE — Transfer of Care (Signed)
Immediate Anesthesia Transfer of Care Note  Patient: Amy Morton  Procedure(s) Performed: Procedure(s) with comments: CATARACT EXTRACTION PHACO AND INTRAOCULAR LENS PLACEMENT (IOC) (Right) - [place] 49.5 AP8.7 CDE4.28 LOT # [id number] H  Patient Location: PACU  Anesthesia Type:MAC  Level of Consciousness: awake, alert  and oriented  Airway & Oxygen Therapy: Patient Spontanous Breathing  Post-op Assessment: Report given to RN and Post -op Vital signs reviewed and stable  Post vital signs: Reviewed and stable  Last Vitals:  Vitals:   11/06/16 0754 11/06/16 0958  BP: (!) 147/66 (!) 126/56  Pulse: 62 (!) 56  Resp: 16 18  Temp: 36.6 C     Last Pain:  Vitals:   11/06/16 0958  TempSrc: Temporal      Patients Stated Pain Goal: 0 (71/69/67 [id number])  Complications: No apparent anesthesia complications

## 2016-11-06 NOTE — Anesthesia Post-op Follow-up Note (Cosign Needed)
Anesthesia QCDR form completed.        

## 2016-11-06 NOTE — Anesthesia Preprocedure Evaluation (Signed)
Anesthesia Evaluation  Patient identified by MRN, date of birth, ID band Patient awake    Reviewed: Allergy & Precautions, H&P , NPO status , Patient's Chart, lab work & pertinent test results  Airway Mallampati: III  TM Distance: <3 FB Neck ROM: limited    Dental  (+) Poor Dentition, Missing, Upper Dentures, Lower Dentures   Pulmonary shortness of breath and with exertion, asthma ,    Pulmonary exam normal breath sounds clear to auscultation       Cardiovascular Exercise Tolerance: Good hypertension, (-) angina+ CAD and + Cardiac Stents  (-) Past MI Normal cardiovascular exam+ Valvular Problems/Murmurs AS  Rhythm:regular Rate:Normal     Neuro/Psych PSYCHIATRIC DISORDERS Anxiety Depression negative neurological ROS     GI/Hepatic Neg liver ROS, PUD, GERD  Controlled,  Endo/Other  negative endocrine ROS  Renal/GU      Musculoskeletal  (+) Arthritis ,   Abdominal   Peds  Hematology negative hematology ROS (+)   Anesthesia Other Findings Past Medical History: 2007: Abnormal mammogram     Comment: Recommend close follow up No date: Anemia     Comment: Iron, b12, SPEP, UPEP normal, 12/2012 No date: Anxiety No date: Aortic stenosis     Comment: mild to moderate No date: aortic stenosis No date: Arthritis No date: Asthma No date: CAD (coronary artery disease)     Comment: s/p angioplasty 1996 No date: Chronic cough No date: Depression No date: Diverticulosis No date: Dyspnea 2010: Elevated LFTs     Comment: s/p ultrasound w/ possible fatty liver; GI               consult. Resolution 2011 No date: Gastric ulcer No date: GERD (gastroesophageal reflux disease) No date: History of colonic polyps     Comment: Dr. Vira Agar No date: HOH (hard of hearing) No date: Hyperlipidemia No date: Hypertension 2001: Peritoneal carcinomatosis [facility])     Comment: Dr. Oliva Bustard & Dr. Claiborne Rigg s/p chemo 2008: Shingles  Comment: T-9 distribution No date: Valvular heart disease     Comment: Mild to Moderate MR, AS No date: Vitamin D deficiency No date: Wheezing  Past Surgical History: 2001: ABDOMINAL HYSTERECTOMY No date: APPENDECTOMY 2007: CARDIAC CATHETERIZATION     Comment: 50% mid LAD stenosis, 70% proximal RCA with               100% distal RCA stenosis with collaterals, EF               65% 04/27/2015: CARDIAC CATHETERIZATION N/A     Comment: Procedure: Left Heart Cath and Coronary               Angiography;  Surgeon: Jettie Booze, MD;              Location: [place] [facility];  Service:               Cardiovascular;  Laterality: N/A; 04/27/2015: CARDIAC CATHETERIZATION N/A     Comment: Procedure: Coronary Stent Intervention;                Surgeon: Jettie Booze, MD;  Location: [place] [facility];  Service: Cardiovascular;                Laterality: N/A; 1987: CHOLECYSTECTOMY 2001: COLECTOMY 1996: CORONARY ANGIOPLASTY No date: CORONARY ANGIOPLASTY 1988: LAPAROSCOPIC BILATERAL SALPINGO OOPHERECTOMY     Comment: Dr. Laverta [place] 1950: TONSILLECTOMY  BMI  Body Mass Index:  28.07 kg/m      Reproductive/Obstetrics negative OB ROS                             Anesthesia Physical  Anesthesia Plan  ASA: III  Anesthesia Plan: MAC   Post-op Pain Management:    Induction:   Airway Management Planned:   Additional Equipment:   Intra-op Plan:   Post-operative Plan:   Informed Consent: I have reviewed the patients History and Physical, chart, labs and discussed the procedure including the risks, benefits and alternatives for the proposed anesthesia with the patient or authorized representative who has indicated his/her understanding and acceptance.     Plan Discussed with: Anesthesiologist, CRNA and Surgeon  Anesthesia Plan Comments:         Anesthesia Quick Evaluation

## 2016-11-06 NOTE — OR Nursing (Signed)
To OR with ecg leads/shoe covers in place.  IV and POX on right Vigamox to OR with patient

## 2016-11-06 NOTE — H&P (Signed)
The History and Physical notes are on paper, have been signed, and are to be scanned.   I have examined the patient and there are no changes to the H&P.   Amy Morton 11/06/2016 8:22 AM

## 2016-11-06 NOTE — Anesthesia Postprocedure Evaluation (Signed)
Anesthesia Post Note  Patient: Amy Morton  Procedure(s) Performed: Procedure(s) (LRB): CATARACT EXTRACTION PHACO AND INTRAOCULAR LENS PLACEMENT (IOC) (Right)  Patient location during evaluation: PACU Anesthesia Type: MAC Level of consciousness: awake and alert Pain management: pain level controlled Vital Signs Assessment: post-procedure vital signs reviewed and stable Respiratory status: spontaneous breathing, nonlabored ventilation, respiratory function stable and patient connected to nasal cannula oxygen Cardiovascular status: stable and blood pressure returned to baseline Anesthetic complications: no     Last Vitals:  Vitals:   11/06/16 0754 11/06/16 0958  BP: (!) 147/66 (!) 126/56  Pulse: 62 (!) 56  Resp: 16 18  Temp: 36.6 C     Last Pain:  Vitals:   11/06/16 0958  TempSrc: Temporal                 Darlyne Russian

## 2016-11-06 NOTE — Op Note (Signed)
OPERATIVE NOTE  Amy Morton [id number] 11/06/2016   PREOPERATIVE DIAGNOSIS:  Nuclear sclerotic cataract right eye.  H25.11   POSTOPERATIVE DIAGNOSIS:    Nuclear sclerotic cataract right eye.     PROCEDURE:  Phacoemusification with posterior chamber intraocular lens placement of the right eye   LENS:   Implant Name Type Inv. Item Serial No. Manufacturer Lot No. LRB No. Used  LENS IOL DIOP 20.0 - [id number] Intraocular Lens LENS IOL DIOP 20.0 [id number]   Right 1       PCB00 +20.0   ULTRASOUND TIME: 0 minutes 49.5 seconds.  CDE 4.28   SURGEON:  Benay Pillow, MD, MPH  ANESTHESIOLOGIST: Anesthesiologist: Alvin Critchley, MD CRNA: Darlyne Russian, CRNA   ANESTHESIA:  Topical with tetracaine drops augmented with 1% preservative-free intracameral lidocaine.  ESTIMATED BLOOD LOSS: less than 1 mL.   COMPLICATIONS:  None.   DESCRIPTION OF PROCEDURE:  The patient was identified in the holding room and transported to the operating room and placed in the supine position under the operating microscope.  The right eye was identified as the operative eye and it was prepped and draped in the usual sterile ophthalmic fashion.   A 1.0 millimeter clear-corneal paracentesis was made at the 10:30 position. 0.5 ml of preservative-free 1% lidocaine with epinephrine was injected into the anterior chamber.  The anterior chamber was filled with Healon 5 viscoelastic.  A 2.4 millimeter keratome was used to make a near-clear corneal incision at the 8:00 position.  A curvilinear capsulorrhexis was made with a cystotome and capsulorrhexis forceps.  Balanced salt solution was used to hydrodissect and hydrodelineate the nucleus.   Phacoemulsification was then used in stop and chop fashion to remove the lens nucleus and epinucleus.  The remaining cortex was then removed using the irrigation and aspiration handpiece. Healon was then placed into the capsular bag to distend it for lens placement.  A lens was  then injected into the capsular bag.  The remaining viscoelastic was aspirated.   Wounds were hydrated with balanced salt solution.  The anterior chamber was inflated to a physiologic pressure with balanced salt solution.   Intracameral vigamox 0.1 mL undiluted was injected into the eye and a drop placed onto the ocular surface.  No wound leaks were noted.  The patient was taken to the recovery room in stable condition without complications of anesthesia or surgery  Benay Pillow 11/06/2016, 9:55 AM

## 2016-11-06 NOTE — Discharge Instructions (Signed)
Eye Surgery Discharge Instructions  Expect mild scratchy sensation or mild soreness. DO NOT RUB YOUR EYE!  The day of surgery:  Minimal physical activity, but bed rest is not required  No reading, computer work, or close hand work  No bending, lifting, or straining.  May watch TV  For 24 hours:  No driving, legal decisions, or alcoholic beverages  Safety precautions  Eat anything you prefer: It is better to start with liquids, then soup then solid foods.  _____ Eye patch should be worn until postoperative exam tomorrow.  ____ Solar shield eyeglasses should be worn for comfort in the sunlight/patch while sleeping  Resume all regular medications including aspirin or Coumadin if these were discontinued prior to surgery. You may shower, bathe, shave, or wash your hair. Tylenol may be taken for mild discomfort.  Call your doctor if you experience significant pain, nausea, or vomiting, fever > 101 or other signs of infection. [phone] or [phone] Specific instructions:  Follow-up Information    Benay Pillow, MD Follow up.   Specialty:  Ophthalmology Why:  FOLLOW UP AT [place], November 07, 2016 AT 10:05 AM. WEAR YOUR CATARACT GLASSES.  Contact information: [address] [place] [address] [phone]

## 2017-01-03 ENCOUNTER — Other Ambulatory Visit: Payer: Self-pay | Admitting: Cardiovascular Disease

## 2017-01-25 ENCOUNTER — Other Ambulatory Visit: Payer: Self-pay | Admitting: Family Medicine

## 2017-02-04 ENCOUNTER — Other Ambulatory Visit: Payer: Self-pay | Admitting: Family Medicine

## 2017-02-12 ENCOUNTER — Ambulatory Visit (INDEPENDENT_AMBULATORY_CARE_PROVIDER_SITE_OTHER): Payer: Medicare Other | Admitting: Cardiovascular Disease

## 2017-02-12 ENCOUNTER — Encounter: Payer: Self-pay | Admitting: Cardiovascular Disease

## 2017-02-12 ENCOUNTER — Encounter (INDEPENDENT_AMBULATORY_CARE_PROVIDER_SITE_OTHER): Payer: Self-pay

## 2017-02-12 VITALS — BP 150/76 | HR 77 | Ht 59.0 in | Wt 141.8 lb

## 2017-02-12 DIAGNOSIS — I2510 Atherosclerotic heart disease of native coronary artery without angina pectoris: Secondary | ICD-10-CM

## 2017-02-12 DIAGNOSIS — I350 Nonrheumatic aortic (valve) stenosis: Secondary | ICD-10-CM

## 2017-02-12 DIAGNOSIS — I2583 Coronary atherosclerosis due to lipid rich plaque: Secondary | ICD-10-CM | POA: Diagnosis not present

## 2017-02-12 DIAGNOSIS — I340 Nonrheumatic mitral (valve) insufficiency: Secondary | ICD-10-CM

## 2017-02-12 NOTE — Patient Instructions (Signed)
Medication Instructions:  Your physician recommends that you continue on your current medications as directed. Please refer to the Current Medication list given to you today.   Labwork: TODAY - cholesterol, complete metabolic panel   Testing/Procedures: Your physician has requested that you have an echocardiogram. Echocardiography is a painless test that uses sound waves to create images of your heart. It provides your doctor with information about the size and shape of your heart and how well your heart's chambers and valves are working. This procedure takes approximately one hour. There are no restrictions for this procedure.   Follow-Up: Your physician wants you to follow-up in: 6 months with Dr. Acie Fredrickson.  You will receive a reminder letter in the mail two months in advance. If you don't receive a letter, please call our office to schedule the follow-up appointment.   If you need a refill on your cardiac medications before your next appointment, please call your pharmacy.   Thank you for choosing [facility]! Christen Bame, RN [phone]

## 2017-02-12 NOTE — Progress Notes (Signed)
Cardiology Office Note   Date:  02/12/2017   ID:  Amy Morton, DOB May 21, 1935, MRN [id number]  PCP:  [name], MD  Cardiologist:   Mertie Moores, MD   Chief Complaint  Patient presents with  . Coronary Artery Disease   1. CAD - s/p PTCA - 1996. S/p PCI of her LCX Sept. 2016 2. Hyperlipidemia 3. Anxiety/depression 4.  History of Present Illness:  Amy Morton is a 82 yo with hx of CAD - s/p PCI in 1996 Amy Morton)  Amy Morton has been seeing Dr. Rosalita Levan. Wanted to changed cardiologist.  Amy Morton was recently prescribed Abilify and had a bad reaction. Amy Morton stated that it made her feel quite bad ( chest burning) Amy Morton has stopped the medication and now feels quite a bit better. Amy Morton was seen in the ER and had marked HTN. BP is now better.   Amy Morton walks daily.  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 28, 2016 Amy Morton is a 82 y.o. female who presents for CAD and hyperlipidemia. Amy Morton has been having exertional CP for 3-4 weeks. Tightness, pressure, mid sternal , radiates out across her chest.  No radiation down arm . Associated with increased dyspnea. Does not have any NTG Has been limiting her exertional activites.  Has trouble getting her mail . Feels very similar to her previous episodes of angina prior to PCI.   Oct. 10, 2016:  Amy Morton is seen back today for her coronary artery disease. I saw her on April 24, 2016  with symptoms of unstable angina. Amy Morton had a cardiac catheterization by Dr. Irish Lack and was found to have chronic total occlusion of her right coronary artery. The mid LAD had a 70% stenosis. The left circumflex artery had a tight stenosis and had stents placed. The mid LAD stenosis is difficult to approach because of a very sharp bend. Amy Morton is basically pain free. Has had some sharp pains that woke her from sleep , took a SL NTG and the pain resolved.  Getting regular exercise - walking regualry   November 12, 2015:  Amy Morton fell in February and had a consussion.   Still having problems with that.  Vertigo issues.  Also has orthostatic hypotension   Doing well from a cardiac standpoint .   Oct. 5, 2017:  No CP or dyspnea C/o some swelling in legs. ( Appears to be very minimal swelling by exam )   Jan. 9, 2018  Has been having some stress - 26 yo daughter is dying of metastatic  cancer   Has been throwing away her NTG each month  Showed an episode of chest pain when Amy Morton was under a great deal of stress with her daughter. Amy Morton's not had any episodes of chest pain with exertion. The episode of chest pain did not feel anything like her previous angina.  February 12, 2017:  Doing well, no angina  Under lots of stres - daughter has metastatic cancer ( ? Renal cell CA)  Was told by her eye doctor that Amy Morton had a louder murmur Amy Morton has mild - mod AS and MR by echo in 2014  Past Medical History:  Diagnosis Date  . Abnormal mammogram 2007   Recommend close follow up  . Anemia    Iron, b12, SPEP, UPEP normal, 12/2012  . Anxiety   . Aortic stenosis    mild to moderate  . aortic stenosis   . Arthritis   .  Asthma   . CAD (coronary artery disease)    s/p angioplasty 1996  . Chronic cough   . Depression   . Diverticulosis   . Dyspnea   . Elevated LFTs 2010   s/p ultrasound w/ possible fatty liver; GI consult. Resolution 2011  . Gastric ulcer   . GERD (gastroesophageal reflux disease)   . History of colonic polyps    Dr. Vira Agar  . HOH (hard of hearing)   . Hyperlipidemia   . Hypertension   . Peritoneal carcinomatosis [facility]) 2001   Dr. Oliva Bustard & Dr. Claiborne Rigg s/p chemo  . Shingles 2008   T-9 distribution  . Valvular heart disease    Mild to Moderate MR, AS  . Vitamin D deficiency   . Wheezing     Past Surgical History:  Procedure Laterality Date  . ABDOMINAL HYSTERECTOMY  2001  . APPENDECTOMY    . CARDIAC CATHETERIZATION  2007   50% mid LAD stenosis, 70% proximal RCA with 100%  distal RCA stenosis with collaterals, EF 65%  . CARDIAC CATHETERIZATION N/A 04/27/2015   Procedure: Left Heart Cath and Coronary Angiography;  Surgeon: Jettie Booze, MD;  Location: [place] [facility];  Service: Cardiovascular;  Laterality: N/A;  . CARDIAC CATHETERIZATION N/A 04/27/2015   Procedure: Coronary Stent Intervention;  Surgeon: Jettie Booze, MD;  Location: [place] [facility];  Service: Cardiovascular;  Laterality: N/A;  . CATARACT EXTRACTION W/PHACO Left 09/25/2016   Procedure: CATARACT EXTRACTION PHACO AND INTRAOCULAR LENS PLACEMENT (IOC);  Surgeon: Eulogio Bear, MD;  Location: [facility];  Service: Ophthalmology;  Laterality: Left;  [place] 46.7 AP% 8.1 CDE 3.80 Fluid pack lot # [id number]  . CATARACT EXTRACTION W/PHACO Right 11/06/2016   Procedure: CATARACT EXTRACTION PHACO AND INTRAOCULAR LENS PLACEMENT ([place]);  Surgeon: Eulogio Bear, MD;  Location: [facility] OR[facility];  Service: Ophthalmology;  Laterality: Right;  [place] 49.5 AP8.7 CDE4.28 LOT # [id number]  . CHOLECYSTECTOMY  1987  . COLECTOMY  2001  . CORONARY ANGIOPLASTY  1996  . CORONARY ANGIOPLASTY    . LAPAROSCOPIC BILATERAL SALPINGO OOPHERECTOMY  1988   Dr. Laverta Baltimore  . TONSILLECTOMY  1950     Current Outpatient Prescriptions  Medication Sig Dispense Refill  . acetaminophen (TYLENOL) 650 MG CR tablet Take 1,000 mg by mouth 2 (two) times daily.     Marland Kitchen ADVAIR DISKUS 250-50 MCG/DOSE AEPB INHALE 1 PUFF INTO THE LUNGS AS NEEDED FOR SHORTNESS OF BREATH 1 each 1  . albuterol (PROVENTIL HFA;VENTOLIN HFA) 108 (90 BASE) MCG/ACT inhaler Inhale 2 puffs into the lungs every 4 (four) hours as needed for wheezing or shortness of breath.     Marland Kitchen aspirin 81 MG tablet Take 81 mg by mouth daily.    Marland Kitchen atorvastatin (LIPITOR) 80 MG tablet TAKE 1 TABLET BY MOUTH DAILY AT 6PM 90 tablet 1  . busPIRone (BUSPAR) 5 MG tablet Take 1.5 tablets (7.5 mg total) by mouth 2 (two) times daily. 270 tablet 3  . calcium gluconate 650 MG tablet Take 650 mg by mouth 2  (two) times daily.    . Cholecalciferol (CVS VIT D 5000 HIGH-POTENCY) 5000 UNITS capsule One capsule every other day (Patient taking differently: Take 5,000 Units by mouth every other day. One capsule every other day)    . citalopram (CELEXA) 20 MG tablet TAKE 1 TABLET BY MOUTH DAILY 90 tablet 0  . clopidogrel (PLAVIX) 75 MG tablet TAKE 1 TABLET BY MOUTH DAILY WITH BREAKFAST 90 tablet 1  . isosorbide mononitrate (  IMDUR) 30 MG 24 hr tablet TAKE 1 TABLET(30 MG) BY MOUTH DAILY 30 tablet 11  . metoprolol (LOPRESSOR) 50 MG tablet TAKE 1 TABLET BY MOUTH TWICE DAILY 180 tablet 3  . mirtazapine (REMERON) 7.5 MG tablet TAKE 1 TABLET(7.5 MG) BY MOUTH AT BEDTIME 90 tablet 0  . Multiple Vitamin (MULTIVITAMIN) capsule Take 1 capsule by mouth daily.    . nitroGLYCERIN (NITROSTAT) 0.4 MG SL tablet PLACE 1 TABLET UNDER THE TONGUE EVERY 5 MINUTES AS NEEDED FOR CHEST PAIN, 3 DOSES MAX 75 tablet 3  . omega-3 acid ethyl esters (LOVAZA) 1 g capsule TAKE 2 CAPSULES BY MOUTH TWICE DAILY 360 capsule 3  . pantoprazole (PROTONIX) 40 MG tablet TAKE 1 TABLET(40 MG) BY MOUTH DAILY 30 tablet 11  . Probiotic Product (FLORAJEN BIFIDOBLEND) CAPS Take 1 capsule by mouth daily. 90 capsule 3  . vitamin B-12 (CYANOCOBALAMIN) 250 MCG tablet Take 250 mcg by mouth daily.     No current facility-administered medications for this visit.     Allergies:   Abilify [aripiprazole]    Social History:  The patient  reports that Amy Morton has never smoked. Amy Morton has never used smokeless tobacco. Amy Morton reports that Amy Morton does not drink alcohol or use drugs.   Family History:  The patient's family history includes Cancer in her father; Cancer (age of onset: 78) in her daughter; Diabetes in her brother and sister; Heart disease in her brother; Hypertension in her mother; Stroke (age of onset: 48) in her mother.    ROS:  Please see the history of present illness.    Review of Systems: Constitutional:  denies fever, chills, diaphoresis, appetite change  and fatigue.  HEENT: denies photophobia, eye pain, redness, hearing loss, ear pain, congestion, sore throat, rhinorrhea, sneezing, neck pain, neck stiffness and tinnitus.  Respiratory: denies SOB, DOE, cough, chest tightness, and wheezing.  Cardiovascular: denies chest pain, palpitations and leg swelling.  Gastrointestinal: denies nausea, vomiting, abdominal pain, diarrhea, constipation, blood in stool.  Genitourinary: denies dysuria, urgency, frequency, hematuria, flank pain and difficulty urinating.  Musculoskeletal: denies  myalgias, back pain, joint swelling, arthralgias and gait problem.   Skin: denies pallor, rash and wound.  Neurological: denies dizziness, seizures, syncope, weakness, light-headedness, numbness and headaches.   Hematological: denies adenopathy, easy bruising, personal or family bleeding history.  Psychiatric/ Behavioral: denies suicidal ideation, mood changes, confusion, nervousness, sleep disturbance and agitation.       All other systems are reviewed and negative.    PHYSICAL EXAM: VS:  BP (!) 150/76 (BP Location: Right Arm, Patient Position: Sitting, Cuff Size: Normal)   Pulse 77   Ht 4\' 11"  (1.499 m)   Wt 141 lb 12.8 oz (64.3 kg)   SpO2 93%   BMI 28.64 kg/m  , BMI Body mass index is 28.64 kg/m. GEN: Well nourished, well developed, in no acute distress  HEENT: normal  Neck: no JVD, carotid bruits, or masses Cardiac: RRR; 1-2 / 6 systolic murmur, rubs, or gallops, no significant  edema  Respiratory:  clear to auscultation bilaterally, normal work of breathing GI: soft, nontender, nondistended, + BS MS:  Large bruise on right forearm Skin: warm and dry, no rash Neuro:  Strength and sensation are intact Psych: normal   EKG:  EKG is ordered today. The ekg ordered today demonstrates normal sinus rhythm at 61. Amy Morton has no ST or T wave changes. The EKG is normal.   Recent Labs: 10/20/2016: ALT 33; BUN 19; Creatinine, Ser 0.71; Hemoglobin 12.0; Platelets  186.0;  Potassium 4.6; Sodium 135    Lipid Panel    Component Value Date/Time   CHOL 88 (L) 04/28/2016 1003   CHOL 148 10/19/2014 1212   TRIG 151 (H) 04/28/2016 1003   HDL 42 (L) 04/28/2016 1003   HDL 48 10/19/2014 1212   CHOLHDL 2.1 04/28/2016 1003   VLDL 30 04/28/2016 1003   LDLCALC 16 04/28/2016 1003   LDLCALC 61 10/19/2014 1212      Wt Readings from Last 3 Encounters:  02/12/17 141 lb 12.8 oz (64.3 kg)  11/06/16 139 lb (63 kg)  10/20/16 143 lb (64.9 kg)      Other studies Reviewed: Additional studies/ records that were reviewed today include:  Review of the above records demonstrates:   ASSESSMENT AND PLAN:  1. Unstable angina  - s/p PTCA - 1996 -   Amy Morton status post PCI of her left circumflex artery 04/26/16.   Marland Kitchen  Dist RCA lesion, 100% stenosed. Known from 2012 catheterization. Short occlusion but heavily calcified. There is a stump if intervention was to be attempted.  Focal Mid LAD-2 lesion, 70% stenosed just after a diagonal at a tortuos segment. The lesion was not previously treated.  Mid Cx lesion, 70% stenosed. There is a 0% residual stenosis post intervention with overlapping DES , Synergy 3.0 x 12 and Synergy 2.5 x 8, all postdilated with a 3.25 balloon. The lesion was not previously treated.  2. Hyperlipidemia -  We'll check fasting labs in 6 months.   3. AS / AI . Her last echo was in 2014.  Will repeat echo   4.  MR - mild - mod   Current medicines are reviewed at length with the patient today.  The patient does not have concerns regarding medicines.  The following changes have been made:  no change  Labs/ tests ordered today include:  Orders Placed This Encounter  Procedures  . EKG 12-Lead     Disposition:   FU with me in 6 months     Signed, Mertie Moores, MD  02/12/2017 1:46 PM    [facility] [address], [place], [place]  [address] Phone: [phone]; Fax: [phone]

## 2017-02-13 ENCOUNTER — Other Ambulatory Visit: Payer: Self-pay | Admitting: Cardiovascular Disease

## 2017-02-13 LAB — COMPREHENSIVE METABOLIC PANEL
ALT: 37 IU/L — ABNORMAL HIGH (ref 0–32)
AST: 51 IU/L — ABNORMAL HIGH (ref 0–40)
Albumin/Globulin Ratio: 1.6 (ref 1.2–2.2)
Albumin: 4.6 g/dL (ref 3.5–4.8)
Alkaline Phosphatase: 43 IU/L (ref 39–117)
BUN/Creatinine Ratio: 22 (ref 12–28)
BUN: 15 mg/dL (ref 8–27)
Bilirubin Total: 0.4 mg/dL (ref 0.0–1.2)
CO2: 22 mmol/L (ref 20–29)
Calcium: 9.6 mg/dL (ref 8.7–10.3)
Chloride: 102 mmol/L (ref 96–106)
Creatinine, Ser: 0.68 mg/dL (ref 0.57–1.00)
GFR calc Af Amer: 96 mL/min/{1.73_m2} (ref >59)
GFR calc non Af Amer: 83 mL/min/{1.73_m2} (ref >59)
Globulin, Total: 2.8 g/dL (ref 1.5–4.5)
Glucose: 98 mg/dL (ref 65–99)
Potassium: 5.1 mmol/L (ref 3.5–5.2)
Sodium: 142 mmol/L (ref 134–144)
Total Protein: 7.4 g/dL (ref 6.0–8.5)

## 2017-02-13 LAB — LIPID PANEL
Chol/HDL Ratio: 2.4 ratio (ref 0.0–4.4)
Cholesterol, Total: 105 mg/dL (ref 100–199)
HDL: 43 mg/dL (ref >39)
LDL Calculated: 37 mg/dL (ref 0–99)
Triglycerides: 127 mg/dL (ref 0–149)
VLDL Cholesterol Cal: 25 mg/dL (ref 5–40)

## 2017-02-20 ENCOUNTER — Ambulatory Visit (HOSPITAL_COMMUNITY): Payer: Medicare Other | Attending: Cardiology

## 2017-02-20 ENCOUNTER — Other Ambulatory Visit: Payer: Self-pay

## 2017-02-20 DIAGNOSIS — I700 Atherosclerosis of aorta: Secondary | ICD-10-CM | POA: Insufficient documentation

## 2017-02-20 DIAGNOSIS — I350 Nonrheumatic aortic (valve) stenosis: Secondary | ICD-10-CM | POA: Insufficient documentation

## 2017-02-20 DIAGNOSIS — I2510 Atherosclerotic heart disease of native coronary artery without angina pectoris: Secondary | ICD-10-CM | POA: Diagnosis not present

## 2017-02-20 DIAGNOSIS — I340 Nonrheumatic mitral (valve) insufficiency: Secondary | ICD-10-CM

## 2017-02-20 LAB — ECHOCARDIOGRAM COMPLETE
AO mean calculated velocity dopler: 212 cm/s
AV Area VTI index: 0.8 cm2/m2
AV Area VTI: 1.28 cm2
AV Area mean vel: 1.15 cm2
AV Mean grad: 20 mmHg
AV Peak grad: 34 mmHg
AV VEL mean LVOT/AV: 0.33
AV area mean vel ind: 0.72 cm2/m2
AV peak Index: 0.81
AV pk vel: 290 cm/s
AV vel: 1.27
Ao pk vel: 0.37 m/s
Ao-asc: 32 cm
E decel time: 317 msec
E/e' ratio: 13.02
FS: 42 % (ref 28–44)
IVS/LV PW RATIO, ED: 0.85
LA ID, A-P, ES: 44 mm
LA diam end sys: 44 mm
LA diam index: 2.77 cm/m2
LA vol A4C: 30.5 ml
LA vol index: 22.1 mL/m2
LA vol: 35.2 mL
LV E/e' medial: 13.02
LV E/e'average: 13.02
LV PW d: 14 mm — AB (ref 0.6–1.1)
LV e' LATERAL: 5.66 cm/s
LVOT SV: 85 mL
LVOT VTI: 24.6 cm
LVOT area: 3.46 cm2
LVOT diameter: 21 mm
LVOT peak VTI: 0.37 cm
LVOT peak vel: 107 cm/s
Lateral S' vel: 13.4 cm/s
MV Dec: 317
MV Peak grad: 2 mmHg
MV pk A vel: 90.7 m/s
MV pk E vel: 73.7 m/s
Reg peak vel: 265 cm/s
TAPSE: 16.5 mm
TDI e' lateral: 5.66
TDI e' medial: 3.81
TR max vel: 265 cm/s
VTI: 67 cm
Valve area index: 0.8
Valve area: 1.27 cm2

## 2017-02-21 ENCOUNTER — Other Ambulatory Visit: Payer: Self-pay | Admitting: Family Medicine

## 2017-03-04 ENCOUNTER — Ambulatory Visit (INDEPENDENT_AMBULATORY_CARE_PROVIDER_SITE_OTHER): Payer: Medicare Other

## 2017-03-04 VITALS — BP 126/68 | HR 61 | Temp 98.3°F | Resp 12 | Ht 59.75 in | Wt 140.1 lb

## 2017-03-04 DIAGNOSIS — Z0000 Encounter for general adult medical examination without abnormal findings: Secondary | ICD-10-CM | POA: Diagnosis not present

## 2017-03-04 NOTE — Progress Notes (Signed)
Subjective:   Amy Morton is a 82 y.o. female who presents for Medicare Annual (Subsequent) preventive examination.  Review of Systems:  No ROS.  Medicare Wellness Visit. Additional risk factors are reflected in the social history.  Cardiac Risk Factors include: advanced age (>72men, >63 women)     Objective:     Vitals: BP 126/68 (BP Location: Left Arm, Patient Position: Sitting, Cuff Size: Normal)   Pulse 61   Temp 98.3 F (36.8 C) (Oral)   Resp 12   Ht 4' 11.75" (1.518 m)   Wt 140 lb 1.9 oz (63.6 kg)   SpO2 95%   BMI 27.59 kg/m   Body mass index is 27.59 kg/m.   Tobacco History  Smoking Status  . Never Smoker  Smokeless Tobacco  . Never Used     Counseling given: Not Answered   Past Medical History:  Diagnosis Date  . Abnormal mammogram 2007   Recommend close follow up  . Anemia    Iron, b12, SPEP, UPEP normal, 12/2012  . Anxiety   . Aortic stenosis    mild to moderate  . aortic stenosis   . Arthritis   . Asthma   . CAD (coronary artery disease)    s/p angioplasty 1996  . Chronic cough   . Depression   . Diverticulosis   . Dyspnea   . Elevated LFTs 2010   s/p ultrasound w/ possible fatty liver; GI consult. Resolution 2011  . Gastric ulcer   . GERD (gastroesophageal reflux disease)   . History of colonic polyps    Dr. Vira Agar  . HOH (hard of hearing)   . Hyperlipidemia   . Hypertension   . Peritoneal carcinomatosis [facility]) 2001   Dr. Oliva Bustard & Dr. Claiborne Rigg s/p chemo  . Shingles 2008   T-9 distribution  . Valvular heart disease    Mild to Moderate MR, AS  . Vitamin D deficiency   . Wheezing    Past Surgical History:  Procedure Laterality Date  . ABDOMINAL HYSTERECTOMY  2001  . APPENDECTOMY    . CARDIAC CATHETERIZATION  2007   50% mid LAD stenosis, 70% proximal RCA with 100% distal RCA stenosis with collaterals, EF 65%  . CARDIAC CATHETERIZATION N/A 04/27/2015   Procedure: Left Heart Cath and Coronary Angiography;  Surgeon:  Jettie Booze, MD;  Location: [place] [facility];  Service: Cardiovascular;  Laterality: N/A;  . CARDIAC CATHETERIZATION N/A 04/27/2015   Procedure: Coronary Stent Intervention;  Surgeon: Jettie Booze, MD;  Location: [place] [facility];  Service: Cardiovascular;  Laterality: N/A;  . CATARACT EXTRACTION W/PHACO Left 09/25/2016   Procedure: CATARACT EXTRACTION PHACO AND INTRAOCULAR LENS PLACEMENT (I[facility]);  Surgeon: Eulogio Bear, MD;  Location: [facility];  Service: Ophthalmology;  Laterality: Left;  [place] 46.7 AP% 8.1 CDE 3.80 Fluid pack lot # [id number]  . CATARACT EXTRACTION W/PHACO Right 11/06/2016   Procedure: CATARACT EXTRACTION PHACO AND INTRAOCULAR LENS PLACEMENT ([place]);  Surgeon: Eulogio Bear, MD;  Location: [facility];  Service: Ophthalmology;  Laterality: Right;  [place] 49.5 AP8.7 CDE4.28 LOT # [id number]  . CHOLECYSTECTOMY  1987  . COLECTOMY  2001  . CORONARY ANGIOPLASTY  1996  . CORONARY ANGIOPLASTY    . LAPAROSCOPIC BILATERAL SALPINGO OOPHERECTOMY  1988   Dr. Laverta Baltimore  . TONSILLECTOMY  1950   Family History  Problem Relation Age of Onset  . Hypertension Mother   . Stroke Mother 55       Cerebral Hemorrhage  .  Cancer Father        bone cancer  . Diabetes Sister   . Diabetes Brother   . Heart disease Brother   . Cancer Daughter 99       Ovarian cancer  . Colon cancer Neg Hx   . Colon polyps Neg Hx    History  Sexual Activity  . Sexual activity: No    Outpatient Encounter Prescriptions as of 03/04/2017  Medication Sig  . acetaminophen (TYLENOL) 650 MG CR tablet Take 1,000 mg by mouth 2 (two) times daily.   Marland Kitchen ADVAIR DISKUS 250-50 MCG/DOSE AEPB INHALE 1 PUFF INTO THE LUNGS AS NEEDED FOR SHORTNESS OF BREATH  . albuterol (PROVENTIL HFA;VENTOLIN HFA) 108 (90 BASE) MCG/ACT inhaler Inhale 2 puffs into the lungs every 4 (four) hours as needed for wheezing or shortness of breath.   Marland Kitchen aspirin 81 MG tablet Take 81 mg by mouth daily.  Marland Kitchen atorvastatin (LIPITOR) 80 MG tablet TAKE 1  TABLET BY MOUTH DAILY AT 6PM  . busPIRone (BUSPAR) 5 MG tablet Take 1.5 tablets (7.5 mg total) by mouth 2 (two) times daily.  . calcium gluconate 650 MG tablet Take 650 mg by mouth 2 (two) times daily.  . Cholecalciferol (CVS VIT D 5000 HIGH-POTENCY) 5000 UNITS capsule One capsule every other day (Patient taking differently: Take 5,000 Units by mouth every other day. One capsule every other day)  . citalopram (CELEXA) 20 MG tablet TAKE 1 TABLET BY MOUTH DAILY  . clopidogrel (PLAVIX) 75 MG tablet TAKE 1 TABLET BY MOUTH DAILY WITH BREAKFAST  . isosorbide mononitrate (IMDUR) 30 MG 24 hr tablet TAKE 1 TABLET(30 MG) BY MOUTH DAILY  . metoprolol (LOPRESSOR) 50 MG tablet TAKE 1 TABLET BY MOUTH TWICE DAILY  . mirtazapine (REMERON) 7.5 MG tablet TAKE 1 TABLET(7.5 MG) BY MOUTH AT BEDTIME  . Multiple Vitamin (MULTIVITAMIN) capsule Take 1 capsule by mouth daily.  . nitroGLYCERIN (NITROSTAT) 0.4 MG SL tablet PLACE 1 TABLET UNDER THE TONGUE EVERY 5 MINUTES AS NEEDED FOR CHEST PAIN, 3 DOSES MAX  . omega-3 acid ethyl esters (LOVAZA) 1 g capsule TAKE 2 CAPSULES BY MOUTH TWICE DAILY  . omega-3 acid ethyl esters (LOVAZA) 1 g capsule TAKE 2 CAPSULES BY MOUTH TWICE DAILY  . pantoprazole (PROTONIX) 40 MG tablet TAKE 1 TABLET(40 MG) BY MOUTH DAILY  . Probiotic Product (FLORAJEN BIFIDOBLEND) CAPS Take 1 capsule by mouth daily.  . vitamin B-12 (CYANOCOBALAMIN) 250 MCG tablet Take 250 mcg by mouth daily.   No facility-administered encounter medications on file as of 03/04/2017.     Activities of Daily Living In your present state of health, do you have any difficulty performing the following activities: 03/04/2017 03/04/2016  Hearing? - N  Vision? Y N  Difficulty concentrating or making decisions? Y Y  Comment Notes she has more difficulty remembering and making decisions Memory delay  Walking or climbing stairs? N Y  Comment - Unsteady gait at times.  Uses hand rails.  Dressing or bathing? N N  Doing errands,  shopping? N N  Preparing Food and eating ? N N  Using the Toilet? N N  In the past six months, have you accidently leaked urine? N N  Do you have problems with loss of bowel control? N N  Comment - Improved with probiotic.  Managing your Medications? N N  Managing your Finances? N N  Housekeeping or managing your Housekeeping? N N  Some recent data might be hidden    Patient Care Team: Caryl Bis,  Angela Adam, MD as PCP - General (Family Medicine) Sherryl Barters, NP as Nurse Practitioner (Nurse Practitioner)    Assessment:    This is a routine wellness examination for [place]. The goal of the wellness visit is to assist the patient how to close the gaps in care and create a preventative care plan for the patient.   The roster of all physicians providing medical care to patient is listed in the Snapshot section of the chart.  Taking calcium VIT D as appropriate/Osteoporosis risk reviewed.    Safety issues reviewed; Life alert, smoke and carbon monoxide detectors in the home. No firearms in the home.  Wears seatbelts when driving or riding with others. Patient does wear sunscreen or protective clothing when in direct sunlight. No violence in the home.  Patient is alert, normal appearance, oriented to person/place/and time.  Correctly identified the president of the [place], recall of 2/3 words, and performing simple calculations. Displays appropriate judgement and can read correct time from watch face.   No new identified risk were noted.  No failures at ADL's or IADL's.   BMI- discussed the importance of a healthy diet, water intake and the benefits of aerobic exercise. Educational material provided.   24 hour diet recall: Breakfast: cereal, fruit Lunch: chicken tenders Dinner: none Snack:popcorn Encouraged low carb foods  Daily fluid intake: 1 cups of caffeine, 2-3 cups of water  Dental- dentures  Eye- Visual acuity not assessed per patient preference since they have regular  follow up with the ophthalmologist.  Wears corrective lenses.  Sleep patterns- Sleeps 6-7 hours at night.  Wakes feeling rested.  TDAP vaccine deferred per patient preference.  Follow up with insurance.  Educational material provided.  Health maintenance gaps- closed.  Patient Concerns: None at this time. Follow up with PCP as needed.  Exercise Activities and Dietary recommendations Current Exercise Habits: Home exercise routine, Type of exercise: walking, Time (Minutes): 45, Frequency (Times/Week): 3, Weekly Exercise (Minutes/Week): 135, Intensity: Mild  Goals    . Increase lean proteins          Low carb foods    . Increase water intake          Stay hydrated      Fall Risk Fall Risk  03/04/2017 04/02/2016 03/04/2016  Falls in the past year? No Yes Yes  Comment - Emmi Telephone Survey: data to providers prior to load -  Number falls in past yr: - 1 1  Comment - Emmi Telephone Survey Actual Response = 1 -  Injury with Fall? - Yes Yes  Follow up - - Falls prevention discussed   Depression Screen PHQ 2/9 Scores 03/04/2017 03/04/2016  PHQ - 2 Score 0 0     Cognitive Function MMSE - Mini Mental State Exam 03/04/2017 03/04/2016  Orientation to time 5 5  Orientation to Place 5 5  Registration 3 3  Attention/ Calculation 5 5  Recall 3 2  Language- name 2 objects 2 2  Language- repeat 1 1  Language- follow 3 step command 3 3  Language- read & follow direction 1 1  Write a sentence 1 1  Copy design 1 1  Total score 30 29        Immunization History  Administered Date(s) Administered  . DTaP 06/23/2011  . Influenza,inj,Quad PF,36+ Mos 04/17/2014, 05/07/2015  . Influenza-Unspecified 04/13/2012, 04/08/2013, 04/17/2014, 05/07/2015, 05/02/2016  . Pneumococcal Conjugate-13 03/04/2016  . Pneumococcal Polysaccharide-23 11/20/2005, 11/21/2010  . Zoster 01/21/2011   Screening Tests Health  Maintenance  Topic Date Due  . TETANUS/TDAP  04/16/1954  . INFLUENZA VACCINE  02/25/2017    . DEXA SCAN  Completed  . PNA vac Low Risk Adult  Completed      Plan:    End of life planning; Advance aging; Advanced directives discussed. Copy of current HCPOA/Living Will requested.    I have personally reviewed and noted the following in the patient's chart:   . Medical and social history . Use of alcohol, tobacco or illicit drugs  . Current medications and supplements . Functional ability and status . Nutritional status . Physical activity . Advanced directives . List of other physicians . Hospitalizations, surgeries, and ER visits in previous 12 months . Vitals . Screenings to include cognitive, depression, and falls . Referrals and appointments  In addition, I have reviewed and discussed with patient certain preventive protocols, quality metrics, and best practice recommendations. A written personalized care plan for preventive services as well as general preventive health recommendations were provided to patient.     Varney Biles, LPN  07/31/9700

## 2017-03-04 NOTE — Patient Instructions (Addendum)
  Amy Morton , Thank you for taking time to come for your Medicare Wellness Visit. I appreciate your ongoing commitment to your health goals. Please review the following plan we discussed and let me know if I can assist you in the future.   Follow up with Dr. Caryl Bis as needed.    Bring a copy of your [place] and/or Living Will to be scanned into chart.  Have a great day!  These are the goals we discussed: Goals    . Increase lean proteins          Low carb foods    . Increase water intake          Stay hydrated       This is a list of the screening recommended for you and due dates:  Health Maintenance  Topic Date Due  . Tetanus Vaccine  [birth year]16/1954  . Flu Shot  02/25/2017  . DEXA scan (bone density measurement)  Completed  . Pneumonia vaccines  Completed

## 2017-04-04 ENCOUNTER — Other Ambulatory Visit: Payer: Self-pay | Admitting: Cardiovascular Disease

## 2017-04-07 DIAGNOSIS — Z23 Encounter for immunization: Secondary | ICD-10-CM | POA: Diagnosis not present

## 2017-04-22 ENCOUNTER — Other Ambulatory Visit: Payer: Self-pay | Admitting: Cardiovascular Disease

## 2017-04-22 ENCOUNTER — Other Ambulatory Visit: Payer: Self-pay | Admitting: Family Medicine

## 2017-05-05 ENCOUNTER — Other Ambulatory Visit: Payer: Self-pay | Admitting: Family Medicine

## 2017-05-19 DIAGNOSIS — M9903 Segmental and somatic dysfunction of lumbar region: Secondary | ICD-10-CM | POA: Diagnosis not present

## 2017-05-19 DIAGNOSIS — M5442 Lumbago with sciatica, left side: Secondary | ICD-10-CM | POA: Diagnosis not present

## 2017-05-20 DIAGNOSIS — M9903 Segmental and somatic dysfunction of lumbar region: Secondary | ICD-10-CM | POA: Diagnosis not present

## 2017-05-20 DIAGNOSIS — M5442 Lumbago with sciatica, left side: Secondary | ICD-10-CM | POA: Diagnosis not present

## 2017-06-27 ENCOUNTER — Other Ambulatory Visit: Payer: Self-pay | Admitting: Cardiovascular Disease

## 2017-07-18 ENCOUNTER — Other Ambulatory Visit: Payer: Self-pay | Admitting: Family Medicine

## 2017-08-11 ENCOUNTER — Encounter: Payer: Self-pay | Admitting: Family Medicine

## 2017-08-11 ENCOUNTER — Ambulatory Visit (INDEPENDENT_AMBULATORY_CARE_PROVIDER_SITE_OTHER): Payer: Medicare Other | Admitting: Family Medicine

## 2017-08-11 ENCOUNTER — Other Ambulatory Visit: Payer: Self-pay

## 2017-08-11 VITALS — BP 134/66 | HR 62 | Temp 97.5°F | Wt 141.0 lb

## 2017-08-11 DIAGNOSIS — I2510 Atherosclerotic heart disease of native coronary artery without angina pectoris: Secondary | ICD-10-CM

## 2017-08-11 DIAGNOSIS — I2583 Coronary atherosclerosis due to lipid rich plaque: Secondary | ICD-10-CM | POA: Diagnosis not present

## 2017-08-11 DIAGNOSIS — G479 Sleep disorder, unspecified: Secondary | ICD-10-CM | POA: Diagnosis not present

## 2017-08-11 DIAGNOSIS — R062 Wheezing: Secondary | ICD-10-CM | POA: Diagnosis not present

## 2017-08-11 DIAGNOSIS — Z859 Personal history of malignant neoplasm, unspecified: Secondary | ICD-10-CM

## 2017-08-11 MED ORDER — MIRTAZAPINE 7.5 MG PO TABS: ORAL_TABLET | ORAL | 2 refills | Status: DC

## 2017-08-11 MED ORDER — FLUTICASONE-SALMETEROL 250-50 MCG/DOSE IN AEPB: 1.0000 | INHALATION_SPRAY | Freq: Two times a day (BID) | RESPIRATORY_TRACT | 2 refills | Status: DC

## 2017-08-11 NOTE — Progress Notes (Signed)
[name], MD Phone: [phone]  Amy Morton is a 83 y.o. female who presents today for f/u.   Patient notes the Remeron has helped significantly with her sleep.  She gets 6-7 hours a night.  She had some grief related to losing her daughter though this has improved.  No depression.  No SI.  No anxiety.  She has good support in place.  Patient reports history of wheezing and intermittent shortness of breath with rare cough treated with Advair as needed.  Symptoms improved with this.  Using albuterol as needed as well.  She reports prior breathing tests though notes these were many years ago.  She states she has never been told she has COPD or asthma.  CAD: Notes she has not had chest pain in a long time.  She continues on aspirin and Plavix.  Continues to follow with cardiology.   Social History   Tobacco Use  Smoking Status Never Smoker  Smokeless Tobacco Never Used     ROS see history of present illness  Objective  Physical Exam Vitals:   08/11/17 1416  BP: 134/66  Pulse: 62  Temp: (!) 97.5 F (36.4 C)  SpO2: 93%    BP Readings from Last 3 Encounters:  08/11/17 134/66  03/04/17 126/68  02/12/17 (!) 150/76   Wt Readings from Last 3 Encounters:  08/11/17 141 lb (64 kg)  03/04/17 140 lb 1.9 oz (63.6 kg)  02/12/17 141 lb 12.8 oz (64.3 kg)    Physical Exam  Constitutional: No distress.  Cardiovascular: Normal rate, regular rhythm and normal heart sounds.  Pulmonary/Chest: Effort normal. No respiratory distress.  Faint scattered wheezes  Musculoskeletal: She exhibits no edema.  Neurological: She is alert. Gait normal.  Skin: Skin is warm and dry. She is not diaphoretic.     Assessment/Plan: Please see individual problem list.  CAD (coronary artery disease) Asymptomatic.  She will continue to follow with cardiology.  Sleep disturbance Sleep issues have improved with the Remeron.  Grief is improving as well.  She would like to continue this  medication.  Refills given.  History of peritoneal carcinoma Patient reports she was released by oncology.  She has had no recurrent issues.  No abdominal pain.  Wheezing Chronic intermittent issue.  Unclear diagnosis.  We will obtain PFTs.  For now she will continue Advair as needed.  Continue albuterol as needed.  Once PFTs return would consider using Advair twice daily.   Orders Placed This Encounter  Procedures  . Pulmonary function test    Standing Status:   Future    Standing Expiration Date:   08/11/2018    Order Specific Question:   Where should this test be performed?    Answer:   [facility]    Order Specific Question:   Full PFT: includes the following: basic spirometry, spirometry pre & post bronchodilator, diffusion capacity (DLCO), lung volumes    Answer:   Full PFT    Order Specific Question:   Methacholine challenge    Answer:   Yes    Meds ordered this encounter  Medications  . Fluticasone-Salmeterol (ADVAIR DISKUS) 250-50 MCG/DOSE AEPB    Sig: Inhale 1 puff into the lungs 2 (two) times daily.    Dispense:  60 each    Refill:  2  . mirtazapine (REMERON) 7.5 MG tablet    Sig: TAKE 1 TABLET(7.5 MG) BY MOUTH AT BEDTIME    Dispense:  90 tablet    Refill:  2  [name], MD [place]

## 2017-08-11 NOTE — Assessment & Plan Note (Signed)
Sleep issues have improved with the Remeron.  Grief is improving as well.  She would like to continue this medication.  Refills given.

## 2017-08-11 NOTE — Patient Instructions (Addendum)
Nice to see you. Please use your Advair as you have been as needed for now and we will get PFTs scheduled.  Once we have these back will likely have you increase it to twice daily. I have refilled her Remeron.

## 2017-08-11 NOTE — Assessment & Plan Note (Signed)
Asymptomatic.  She will continue to follow with cardiology. 

## 2017-08-11 NOTE — Assessment & Plan Note (Signed)
Patient reports she was released by oncology.  She has had no recurrent issues.  No abdominal pain.

## 2017-08-11 NOTE — Assessment & Plan Note (Signed)
Chronic intermittent issue.  Unclear diagnosis.  We will obtain PFTs.  For now she will continue Advair as needed.  Continue albuterol as needed.  Once PFTs return would consider using Advair twice daily.

## 2017-08-20 ENCOUNTER — Other Ambulatory Visit: Payer: Self-pay | Admitting: Family Medicine

## 2017-08-20 DIAGNOSIS — R062 Wheezing: Secondary | ICD-10-CM

## 2017-08-25 DIAGNOSIS — H43813 Vitreous degeneration, bilateral: Secondary | ICD-10-CM | POA: Diagnosis not present

## 2017-08-27 ENCOUNTER — Ambulatory Visit: Payer: Medicare Other | Attending: Family Medicine

## 2017-08-27 DIAGNOSIS — R062 Wheezing: Secondary | ICD-10-CM | POA: Insufficient documentation

## 2017-08-27 DIAGNOSIS — J449 Chronic obstructive pulmonary disease, unspecified: Secondary | ICD-10-CM | POA: Diagnosis not present

## 2017-08-27 MED ORDER — SODIUM CHLORIDE 0.9 % IN NEBU: 3.0000 mL | INHALATION_SOLUTION | Freq: Once | RESPIRATORY_TRACT | Status: AC

## 2017-08-27 MED ORDER — METHACHOLINE 1 MG/ML NEB SOLN
2.0000 mL | Freq: Once | RESPIRATORY_TRACT | Status: AC
  Filled 2017-08-27: qty 2

## 2017-08-27 MED ORDER — METHACHOLINE 0.25 MG/ML NEB SOLN
2.0000 mL | Freq: Once | RESPIRATORY_TRACT | Status: AC
  Filled 2017-08-27: qty 2

## 2017-08-27 MED ORDER — METHACHOLINE 16 MG/ML NEB SOLN
2.0000 mL | Freq: Once | RESPIRATORY_TRACT | Status: AC
  Filled 2017-08-27: qty 2

## 2017-08-27 MED ORDER — METHACHOLINE 0.0625 MG/ML NEB SOLN
2.0000 mL | Freq: Once | RESPIRATORY_TRACT | Status: AC
  Filled 2017-08-27: qty 2

## 2017-08-27 MED ORDER — ALBUTEROL SULFATE (2.5 MG/3ML) 0.083% IN NEBU
2.5000 mg | INHALATION_SOLUTION | Freq: Once | RESPIRATORY_TRACT | Status: AC
  Administered 2017-08-27: 2.5 mg via RESPIRATORY_TRACT
  Filled 2017-08-27: qty 3

## 2017-08-27 MED ORDER — METHACHOLINE 4 MG/ML NEB SOLN
2.0000 mL | Freq: Once | RESPIRATORY_TRACT | Status: AC
  Filled 2017-08-27: qty 2

## 2017-09-03 ENCOUNTER — Ambulatory Visit (INDEPENDENT_AMBULATORY_CARE_PROVIDER_SITE_OTHER): Payer: Medicare Other | Admitting: Cardiovascular Disease

## 2017-09-03 ENCOUNTER — Encounter: Payer: Self-pay | Admitting: Cardiovascular Disease

## 2017-09-03 VITALS — BP 128/74 | HR 73 | Ht 59.0 in | Wt 143.8 lb

## 2017-09-03 DIAGNOSIS — E782 Mixed hyperlipidemia: Secondary | ICD-10-CM | POA: Diagnosis not present

## 2017-09-03 DIAGNOSIS — I2583 Coronary atherosclerosis due to lipid rich plaque: Secondary | ICD-10-CM | POA: Diagnosis not present

## 2017-09-03 DIAGNOSIS — I2510 Atherosclerotic heart disease of native coronary artery without angina pectoris: Secondary | ICD-10-CM

## 2017-09-03 LAB — HEPATIC FUNCTION PANEL
ALT: 50 IU/L — ABNORMAL HIGH (ref 0–32)
AST: 51 IU/L — ABNORMAL HIGH (ref 0–40)
Albumin: 4.8 g/dL — ABNORMAL HIGH (ref 3.5–4.7)
Alkaline Phosphatase: 49 IU/L (ref 39–117)
Bilirubin Total: 0.6 mg/dL (ref 0.0–1.2)
Bilirubin, Direct: 0.17 mg/dL (ref 0.00–0.40)
Total Protein: 7.4 g/dL (ref 6.0–8.5)

## 2017-09-03 LAB — LIPID PANEL
Chol/HDL Ratio: 2.3 ratio (ref 0.0–4.4)
Cholesterol, Total: 115 mg/dL (ref 100–199)
HDL: 50 mg/dL (ref >39)
LDL Calculated: 29 mg/dL (ref 0–99)
Triglycerides: 179 mg/dL — ABNORMAL HIGH (ref 0–149)
VLDL Cholesterol Cal: 36 mg/dL (ref 5–40)

## 2017-09-03 LAB — BASIC METABOLIC PANEL
BUN/Creatinine Ratio: 21 (ref 12–28)
BUN: 15 mg/dL (ref 8–27)
CO2: 25 mmol/L (ref 20–29)
Calcium: 9.7 mg/dL (ref 8.7–10.3)
Chloride: 97 mmol/L (ref 96–106)
Creatinine, Ser: 0.73 mg/dL (ref 0.57–1.00)
GFR calc Af Amer: 90 mL/min/{1.73_m2} (ref >59)
GFR calc non Af Amer: 78 mL/min/{1.73_m2} (ref >59)
Glucose: 104 mg/dL — ABNORMAL HIGH (ref 65–99)
Potassium: 4.9 mmol/L (ref 3.5–5.2)
Sodium: 137 mmol/L (ref 134–144)

## 2017-09-03 NOTE — Patient Instructions (Signed)

## 2017-09-03 NOTE — Progress Notes (Signed)
Cardiology Office Note   Date:  09/03/2017   ID:  Amy Morton, DOB 08-08-1934, MRN [id number]  PCP:  [name], MD  Cardiologist:   Mertie Moores, MD   Chief Complaint  Patient presents with  . Coronary Artery Disease  . Hyperlipidemia   1. CAD - s/p PTCA - 1996. S/p PCI of her LCX Sept. 2016 2. Hyperlipidemia 3. Anxiety/depression 4.  History of Present Illness:  Amy Morton is a 83 yo with hx of CAD - s/p PCI in 1996 Amy Morton)  She has been seeing Dr. Rosalita Levan. Wanted to changed cardiologist.  She was recently prescribed Abilify and had a bad reaction. She stated that it made her feel quite bad ( chest burning) She has stopped the medication and now feels quite a bit better. She was seen in the ER and had marked HTN. BP is now better.   She walks daily.  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 28, 2016 Amy Morton is a 83 y.o. female who presents for CAD and hyperlipidemia. She has been having exertional CP for 3-4 weeks. Tightness, pressure, mid sternal , radiates out across her chest.  No radiation down arm . Associated with increased dyspnea. Does not have any NTG Has been limiting her exertional activites.  Has trouble getting her mail . Feels very similar to her previous episodes of angina prior to PCI.   Oct. 10, 2016:  Amy Morton is seen back today for her coronary artery disease. I saw her on April 24, 2016  with symptoms of unstable angina. She had a cardiac catheterization by Dr. Irish Lack and was found to have chronic total occlusion of her right coronary artery. The mid LAD had a 70% stenosis. The left circumflex artery had a tight stenosis and had stents placed. The mid LAD stenosis is difficult to approach because of a very sharp bend. She is basically pain free. Has had some sharp pains that woke her from sleep , took a SL NTG and the pain resolved.  Getting regular exercise - walking regualry    November 12, 2015:  She fell in February and had a consussion.   Still having problems with that.  Vertigo issues.  Also has orthostatic hypotension   Doing well from a cardiac standpoint .   Oct. 5, 2017:  No CP or dyspnea C/o some swelling in legs. ( Appears to be very minimal swelling by exam )   Jan. 9, 2018  Has been having some stress - 53 yo daughter is dying of metastatic  cancer   Has been throwing away her NTG each month  Showed an episode of chest pain when she was under a great deal of stress with her daughter. She's not had any episodes of chest pain with exertion. The episode of chest pain did not feel anything like her previous angina.  February 12, 2017:  Doing well, no angina  Under lots of stress - daughter has metastatic cancer ( ? Renal cell CA)  Was told by her eye doctor that she had a louder murmur She has mild - mod AS and MR by echo in 2014  September 03, 2017: Amy Morton is doing fairly well.  Has a history of coronary artery disease and moderate aortic stenosis/mild mitral regurgitation  .  She was also found to have a dynamic LVOT obstruction with her last echocardiogram in July, 2018.  No CP ,   Breathing  is good.  Able to get out and do her normal activities. .  Past Medical History:  Diagnosis Date  . Abnormal mammogram 2007   Recommend close follow up  . Anemia    Iron, b12, SPEP, UPEP normal, 12/2012  . Anxiety   . Aortic stenosis    mild to moderate  . aortic stenosis   . Arthritis   . Asthma   . CAD (coronary artery disease)    s/p angioplasty 1996  . Chronic cough   . Depression   . Diverticulosis   . Dyspnea   . Elevated LFTs 2010   s/p ultrasound w/ possible fatty liver; GI consult. Resolution 2011  . Gastric ulcer   . GERD (gastroesophageal reflux disease)   . History of colonic polyps    Dr. Vira Agar  . HOH (hard of hearing)   . Hyperlipidemia   . Hypertension   . Peritoneal carcinomatosis [facility]) 2001   Dr. Oliva Bustard & Dr. Claiborne Rigg s/p chemo  . Shingles 2008   T-9 distribution  . Valvular heart disease    Mild to Moderate MR, AS  . Vitamin D deficiency   . Wheezing     Past Surgical History:  Procedure Laterality Date  . ABDOMINAL HYSTERECTOMY  2001  . APPENDECTOMY    . CARDIAC CATHETERIZATION  2007   50% mid LAD stenosis, 70% proximal RCA with 100% distal RCA stenosis with collaterals, EF 65%  . CARDIAC CATHETERIZATION N/A 04/27/2015   Procedure: Left Heart Cath and Coronary Angiography;  Surgeon: Jettie Booze, MD;  Location: [facility];  Service: Cardiovascular;  Laterality: N/A;  . CARDIAC CATHETERIZATION N/A 04/27/2015   Procedure: Coronary Stent Intervention;  Surgeon: Jettie Booze, MD;  Location: [place] [facility];  Service: Cardiovascular;  Laterality: N/A;  . CATARACT EXTRACTION W/PHACO Left 09/25/2016   Procedure: CATARACT EXTRACTION PHACO AND INTRAOCULAR LENS PLACEMENT (IOC);  Surgeon: Eulogio Bear, MD;  Location: [facility];  Service: Ophthalmology;  Laterality: Left;  [place] 46.7 AP% 8.1 CDE 3.80 Fluid pack lot # [id number]  . CATARACT EXTRACTION W/PHACO Right 11/06/2016   Procedure: CATARACT EXTRACTION PHACO AND INTRAOCULAR LENS PLACEMENT ([place]);  Surgeon: Eulogio Bear, MD;  Location: [facility];  Service: Ophthalmology;  Laterality: Right;  [place] 49.5 AP8.7 CDE4.28 LOT # [id number]  . CHOLECYSTECTOMY  1987  . COLECTOMY  2001  . CORONARY ANGIOPLASTY  1996  . CORONARY ANGIOPLASTY    . LAPAROSCOPIC BILATERAL SALPINGO OOPHERECTOMY  1988   Dr. Laverta Baltimore  . TONSILLECTOMY  1950     Current Outpatient Medications  Medication Sig Dispense Refill  . acetaminophen (TYLENOL) 650 MG CR tablet Take 1,000 mg by mouth 2 (two) times daily.     Marland Kitchen albuterol (PROVENTIL HFA;VENTOLIN HFA) 108 (90 BASE) MCG/ACT inhaler Inhale 2 puffs into the lungs every 4 (four) hours as needed for wheezing or shortness of breath.     Marland Kitchen aspirin 81 MG tablet Take 81 mg by mouth daily.    Marland Kitchen atorvastatin (LIPITOR)  80 MG tablet TAKE 1 TABLET BY MOUTH DAILY AT 6PM 90 tablet 1  . calcium gluconate 650 MG tablet Take 650 mg by mouth 2 (two) times daily.    . Cholecalciferol 5000 units TABS Take 5,000 Units by mouth daily.    . citalopram (CELEXA) 20 MG tablet TAKE 1 TABLET BY MOUTH DAILY 90 tablet 0  . clopidogrel (PLAVIX) 75 MG tablet TAKE 1 TABLET BY MOUTH DAILY WITH BREAKFAST 90 tablet 1  .  Fluticasone-Salmeterol (ADVAIR DISKUS) 250-50 MCG/DOSE AEPB Inhale 1 puff into the lungs 2 (two) times daily. 60 each 2  . isosorbide mononitrate (IMDUR) 30 MG 24 hr tablet TAKE 1 TABLET(30 MG) BY MOUTH DAILY 30 tablet 9  . metoprolol (LOPRESSOR) 50 MG tablet TAKE 1 TABLET BY MOUTH TWICE DAILY 180 tablet 3  . metoprolol tartrate (LOPRESSOR) 50 MG tablet TAKE 1 TABLET(50 MG) BY MOUTH TWICE DAILY 180 tablet 3  . mirtazapine (REMERON) 7.5 MG tablet TAKE 1 TABLET(7.5 MG) BY MOUTH AT BEDTIME 90 tablet 2  . Multiple Vitamin (MULTIVITAMIN) capsule Take 1 capsule by mouth daily.    . nitroGLYCERIN (NITROSTAT) 0.4 MG SL tablet PLACE 1 TABLET UNDER THE TONGUE EVERY 5 MINUTES AS NEEDED FOR CHEST PAIN, 3 DOSES MAX 75 tablet 3  . omega-3 acid ethyl esters (LOVAZA) 1 g capsule TAKE 2 CAPSULES BY MOUTH TWICE DAILY 360 capsule 3  . omega-3 acid ethyl esters (LOVAZA) 1 g capsule TAKE 2 CAPSULES BY MOUTH TWICE DAILY 360 capsule 0  . pantoprazole (PROTONIX) 40 MG tablet TAKE 1 TABLET(40 MG) BY MOUTH DAILY 30 tablet 11  . Probiotic Product (FLORAJEN BIFIDOBLEND) CAPS Take 1 capsule by mouth daily. 90 capsule 3  . vitamin B-12 (CYANOCOBALAMIN) 250 MCG tablet Take 250 mcg by mouth daily.     No current facility-administered medications for this visit.    Facility-Administered Medications Ordered in Other Visits  Medication Dose Route Frequency Provider Last Rate Last Dose  . sodium chloride 0.9 % nebulizer solution 3 mL  3 mL Nebulization Once [name], MD       Followed by  . methacholine (PROVOCHOLINE) inhaler solution 0.125  mg  2 mL Inhalation Once [name], MD       Followed by  . methacholine (PROVOCHOLINE) inhaler solution 0.5 mg  2 mL Inhalation Once [name], MD       Followed by  . methacholine (PROVOCHOLINE) inhaler solution 2 mg  2 mL Inhalation Once [name], MD       Followed by  . methacholine (PROVOCHOLINE) inhaler solution 8 mg  2 mL Inhalation Once [name], MD       Followed by  . methacholine (PROVOCHOLINE) inhaler solution 32 mg  2 mL Inhalation Once [name], MD        Allergies:   Abilify [aripiprazole]    Social History:  The patient  reports that  has never smoked. she has never used smokeless tobacco. She reports that she does not drink alcohol or use drugs.   Family History:  The patient's family history includes Cancer in her father; Cancer (age of onset: 87) in her daughter; Diabetes in her brother and sister; Heart disease in her brother; Hypertension in her mother; Stroke (age of onset: 43) in her mother.    ROS: Noted in current history, all other systems are negative.   Physical Exam: Blood pressure 128/74, pulse 73, height 4\' 11"  (1.499 m), weight 143 lb 12.8 oz (65.2 kg).  GEN:  Well nourished, well developed in no acute distress HEENT: Normal NECK: No JVD; No carotid bruits LYMPHATICS: No lymphadenopathy CARDIAC: RR,  2/6 systolic murmur  RESPIRATORY:  Bilateral wheezing .  ABDOMEN: Soft, non-tender, non-distended MUSCULOSKELETAL:  No edema; No deformity  SKIN: Warm and dry NEUROLOGIC:  Alert and oriented x 3   EKG:    NSR at 73.   occadional PVC   Recent Labs: 10/20/2016: Hemoglobin 12.0; Platelets 186.0 02/12/2017:  ALT 37; BUN 15; Creatinine, Ser 0.68; Potassium 5.1; Sodium 142    Lipid Panel    Component Value Date/Time   CHOL 105 02/12/2017 1407   TRIG 127 02/12/2017 1407   HDL 43 02/12/2017 1407   CHOLHDL 2.4 02/12/2017 1407   CHOLHDL 2.1 04/28/2016 1003   VLDL 30 04/28/2016 1003   LDLCALC 37  02/12/2017 1407      Wt Readings from Last 3 Encounters:  09/03/17 143 lb 12.8 oz (65.2 kg)  08/11/17 141 lb (64 kg)  03/04/17 140 lb 1.9 oz (63.6 kg)      Other studies Reviewed: Additional studies/ records that were reviewed today include:  Review of the above records demonstrates:   ASSESSMENT AND PLAN:  1. Unstable angina  - s/p PTCA - 1996 -   She status post PCI of her left circumflex artery 04/26/16.   Marland Kitchen  Dist RCA lesion, 100% stenosed. Known from 2012 catheterization. Short occlusion but heavily calcified. There is a stump if intervention was to be attempted.  Focal Mid LAD-2 lesion, 70% stenosed just after a diagonal at a tortuos segment. The lesion was not previously treated.  Mid Cx lesion, 70% stenosed. There is a 0% residual stenosis post intervention with overlapping DES , Synergy 3.0 x 12 and Synergy 2.5 x 8, all postdilated with a 3.25 balloon. The lesion was not previously treated.  2. Hyperlipidemia -    Check fasting labs today    3. LVOT dynamic obstruction:   Stable  4.  MR - mild - mod   Current medicines are reviewed at length with the patient today.  The patient does not have concerns regarding medicines.  The following changes have been made:  no change  Labs/ tests ordered today include:  Orders Placed This Encounter  Procedures  . EKG 12-Lead     Disposition:   FU with me in 6 months     Signed, Mertie Moores, MD  09/03/2017 10:29 AM    [facility] [address], [place], [place]  [address] Phone: [phone]; Fax: [phone]

## 2017-10-14 ENCOUNTER — Other Ambulatory Visit: Payer: Self-pay | Admitting: Family Medicine

## 2017-11-12 ENCOUNTER — Other Ambulatory Visit: Payer: Self-pay | Admitting: Family Medicine

## 2017-12-18 ENCOUNTER — Other Ambulatory Visit: Payer: Self-pay | Admitting: Internal Medicine

## 2017-12-29 ENCOUNTER — Other Ambulatory Visit: Payer: Self-pay | Admitting: Cardiovascular Disease

## 2018-01-11 ENCOUNTER — Other Ambulatory Visit: Payer: Self-pay | Admitting: Family Medicine

## 2018-01-13 ENCOUNTER — Other Ambulatory Visit: Payer: Self-pay | Admitting: Cardiovascular Disease

## 2018-01-22 ENCOUNTER — Observation Stay
Admission: EM | Admit: 2018-01-22 | Discharge: 2018-01-25 | Disposition: A | Discharge status: Home / Self Care | Payer: Medicare Other | Attending: Specialist | Admitting: Specialist

## 2018-01-22 ENCOUNTER — Emergency Department: Payer: Medicare Other

## 2018-01-22 ENCOUNTER — Other Ambulatory Visit: Payer: Self-pay

## 2018-01-22 DIAGNOSIS — R079 Chest pain, unspecified: Secondary | ICD-10-CM

## 2018-01-22 DIAGNOSIS — Z90710 Acquired absence of both cervix and uterus: Secondary | ICD-10-CM | POA: Insufficient documentation

## 2018-01-22 DIAGNOSIS — F419 Anxiety disorder, unspecified: Secondary | ICD-10-CM | POA: Insufficient documentation

## 2018-01-22 DIAGNOSIS — Z79899 Other long term (current) drug therapy: Secondary | ICD-10-CM | POA: Diagnosis not present

## 2018-01-22 DIAGNOSIS — K219 Gastro-esophageal reflux disease without esophagitis: Secondary | ICD-10-CM | POA: Insufficient documentation

## 2018-01-22 DIAGNOSIS — I209 Angina pectoris, unspecified: Secondary | ICD-10-CM

## 2018-01-22 DIAGNOSIS — F329 Major depressive disorder, single episode, unspecified: Secondary | ICD-10-CM | POA: Insufficient documentation

## 2018-01-22 DIAGNOSIS — I2584 Coronary atherosclerosis due to calcified coronary lesion: Secondary | ICD-10-CM | POA: Diagnosis not present

## 2018-01-22 DIAGNOSIS — M1990 Unspecified osteoarthritis, unspecified site: Secondary | ICD-10-CM | POA: Insufficient documentation

## 2018-01-22 DIAGNOSIS — Z823 Family history of stroke: Secondary | ICD-10-CM | POA: Diagnosis not present

## 2018-01-22 DIAGNOSIS — H9190 Unspecified hearing loss, unspecified ear: Secondary | ICD-10-CM | POA: Diagnosis not present

## 2018-01-22 DIAGNOSIS — I420 Dilated cardiomyopathy: Secondary | ICD-10-CM | POA: Insufficient documentation

## 2018-01-22 DIAGNOSIS — E871 Hypo-osmolality and hyponatremia: Secondary | ICD-10-CM | POA: Insufficient documentation

## 2018-01-22 DIAGNOSIS — I10 Essential (primary) hypertension: Secondary | ICD-10-CM | POA: Insufficient documentation

## 2018-01-22 DIAGNOSIS — Z8619 Personal history of other infectious and parasitic diseases: Secondary | ICD-10-CM | POA: Insufficient documentation

## 2018-01-22 DIAGNOSIS — Z888 Allergy status to other drugs, medicaments and biological substances status: Secondary | ICD-10-CM | POA: Diagnosis not present

## 2018-01-22 DIAGNOSIS — Z8249 Family history of ischemic heart disease and other diseases of the circulatory system: Secondary | ICD-10-CM | POA: Insufficient documentation

## 2018-01-22 DIAGNOSIS — Z7951 Long term (current) use of inhaled steroids: Secondary | ICD-10-CM | POA: Diagnosis not present

## 2018-01-22 DIAGNOSIS — E878 Other disorders of electrolyte and fluid balance, not elsewhere classified: Secondary | ICD-10-CM | POA: Diagnosis not present

## 2018-01-22 DIAGNOSIS — Z9889 Other specified postprocedural states: Secondary | ICD-10-CM | POA: Insufficient documentation

## 2018-01-22 DIAGNOSIS — I083 Combined rheumatic disorders of mitral, aortic and tricuspid valves: Secondary | ICD-10-CM | POA: Insufficient documentation

## 2018-01-22 DIAGNOSIS — I2510 Atherosclerotic heart disease of native coronary artery without angina pectoris: Secondary | ICD-10-CM | POA: Diagnosis not present

## 2018-01-22 DIAGNOSIS — Z7982 Long term (current) use of aspirin: Secondary | ICD-10-CM | POA: Insufficient documentation

## 2018-01-22 DIAGNOSIS — E785 Hyperlipidemia, unspecified: Secondary | ICD-10-CM | POA: Insufficient documentation

## 2018-01-22 DIAGNOSIS — I208 Other forms of angina pectoris: Secondary | ICD-10-CM | POA: Diagnosis not present

## 2018-01-22 DIAGNOSIS — I200 Unstable angina: Secondary | ICD-10-CM

## 2018-01-22 DIAGNOSIS — Z86010 Personal history of colonic polyps: Secondary | ICD-10-CM | POA: Insufficient documentation

## 2018-01-22 DIAGNOSIS — I25119 Atherosclerotic heart disease of native coronary artery with unspecified angina pectoris: Principal | ICD-10-CM | POA: Insufficient documentation

## 2018-01-22 DIAGNOSIS — Z9842 Cataract extraction status, left eye: Secondary | ICD-10-CM | POA: Insufficient documentation

## 2018-01-22 DIAGNOSIS — Z955 Presence of coronary angioplasty implant and graft: Secondary | ICD-10-CM | POA: Insufficient documentation

## 2018-01-22 DIAGNOSIS — J45909 Unspecified asthma, uncomplicated: Secondary | ICD-10-CM | POA: Diagnosis not present

## 2018-01-22 DIAGNOSIS — Z9841 Cataract extraction status, right eye: Secondary | ICD-10-CM | POA: Insufficient documentation

## 2018-01-22 DIAGNOSIS — Z8719 Personal history of other diseases of the digestive system: Secondary | ICD-10-CM | POA: Insufficient documentation

## 2018-01-22 DIAGNOSIS — E559 Vitamin D deficiency, unspecified: Secondary | ICD-10-CM | POA: Diagnosis not present

## 2018-01-22 DIAGNOSIS — Z90722 Acquired absence of ovaries, bilateral: Secondary | ICD-10-CM | POA: Insufficient documentation

## 2018-01-22 DIAGNOSIS — Z7901 Long term (current) use of anticoagulants: Secondary | ICD-10-CM | POA: Diagnosis not present

## 2018-01-22 DIAGNOSIS — R0789 Other chest pain: Secondary | ICD-10-CM | POA: Diagnosis present

## 2018-01-22 DIAGNOSIS — Z9049 Acquired absence of other specified parts of digestive tract: Secondary | ICD-10-CM | POA: Insufficient documentation

## 2018-01-22 LAB — BASIC METABOLIC PANEL
Anion gap: 9 (ref 5–15)
BUN: 17 mg/dL (ref 8–23)
CO2: 27 mmol/L (ref 22–32)
Calcium: 8.8 mg/dL — ABNORMAL LOW (ref 8.9–10.3)
Chloride: 92 mmol/L — ABNORMAL LOW (ref 98–111)
Creatinine, Ser: 0.74 mg/dL (ref 0.44–1.00)
GFR calc Af Amer: >60 mL/min (ref >60)
GFR calc non Af Amer: >60 mL/min (ref >60)
Glucose, Bld: 123 mg/dL — ABNORMAL HIGH (ref 70–99)
Potassium: 3.6 mmol/L (ref 3.5–5.1)
Sodium: 128 mmol/L — ABNORMAL LOW (ref 135–145)

## 2018-01-22 LAB — CBC
HCT: 33.3 % — ABNORMAL LOW (ref 35.0–47.0)
Hemoglobin: 11.5 g/dL — ABNORMAL LOW (ref 12.0–16.0)
MCH: 32.3 pg (ref 26.0–34.0)
MCHC: 34.5 g/dL (ref 32.0–36.0)
MCV: 93.6 fL (ref 80.0–100.0)
Platelets: 161 10*3/uL (ref 150–440)
RBC: 3.56 MIL/uL — ABNORMAL LOW (ref 3.80–5.20)
RDW: 12.7 % (ref 11.5–14.5)
WBC: 7 10*3/uL (ref 3.6–11.0)

## 2018-01-22 LAB — HEPATIC FUNCTION PANEL
ALT: 35 U/L (ref 0–44)
AST: 45 U/L — ABNORMAL HIGH (ref 15–41)
Albumin: 4.2 g/dL (ref 3.5–5.0)
Alkaline Phosphatase: 39 U/L (ref 38–126)
Bilirubin, Direct: <0.1 mg/dL (ref 0.0–0.2)
Total Bilirubin: 0.4 mg/dL (ref 0.3–1.2)
Total Protein: 7.2 g/dL (ref 6.5–8.1)

## 2018-01-22 LAB — TROPONIN I
Troponin I: <0.03 ng/mL (ref <0.03)
Troponin I: <0.03 ng/mL (ref <0.03)

## 2018-01-22 LAB — LIPASE, BLOOD: Lipase: 36 U/L (ref 11–51)

## 2018-01-22 MED ORDER — ADULT MULTIVITAMIN W/MINERALS CH
1.0000 | ORAL_TABLET | Freq: Every day | ORAL | Status: DC
  Administered 2018-01-23 – 2018-01-25 (×3): 1 via ORAL
  Filled 2018-01-22 (×3): qty 1

## 2018-01-22 MED ORDER — CLOPIDOGREL BISULFATE 75 MG PO TABS
75.0000 mg | ORAL_TABLET | Freq: Every day | ORAL | Status: DC
  Administered 2018-01-23 – 2018-01-25 (×3): 75 mg via ORAL
  Filled 2018-01-22 (×3): qty 1

## 2018-01-22 MED ORDER — NITROGLYCERIN 2 % TD OINT
1.0000 [in_us] | TOPICAL_OINTMENT | Freq: Four times a day (QID) | TRANSDERMAL | Status: DC
  Administered 2018-01-22 – 2018-01-25 (×9): 1 [in_us] via TOPICAL
  Filled 2018-01-22 (×9): qty 1

## 2018-01-22 MED ORDER — ATORVASTATIN CALCIUM 20 MG PO TABS
80.0000 mg | ORAL_TABLET | Freq: Every day | ORAL | Status: DC
  Administered 2018-01-23 – 2018-01-24 (×2): 80 mg via ORAL
  Filled 2018-01-22 (×2): qty 4

## 2018-01-22 MED ORDER — SODIUM CHLORIDE 0.9 % IV SOLN
INTRAVENOUS | Status: DC
  Administered 2018-01-22: 22:00:00 via INTRAVENOUS

## 2018-01-22 MED ORDER — ISOSORBIDE MONONITRATE ER 30 MG PO TB24
30.0000 mg | ORAL_TABLET | Freq: Every day | ORAL | Status: DC
  Administered 2018-01-22 – 2018-01-25 (×4): 30 mg via ORAL
  Filled 2018-01-22 (×4): qty 1

## 2018-01-22 MED ORDER — CITALOPRAM HYDROBROMIDE 20 MG PO TABS
20.0000 mg | ORAL_TABLET | Freq: Every day | ORAL | Status: DC
  Administered 2018-01-23 – 2018-01-25 (×3): 20 mg via ORAL
  Filled 2018-01-22 (×3): qty 1

## 2018-01-22 MED ORDER — ACETAMINOPHEN 325 MG PO TABS
650.0000 mg | ORAL_TABLET | ORAL | Status: DC | PRN
  Administered 2018-01-23: 650 mg via ORAL
  Filled 2018-01-22: qty 2

## 2018-01-22 MED ORDER — VITAMIN D3 25 MCG (1000 UNIT) PO TABS
5000.0000 [IU] | ORAL_TABLET | Freq: Every day | ORAL | Status: DC
  Administered 2018-01-23 – 2018-01-25 (×3): 5000 [IU] via ORAL
  Filled 2018-01-22 (×3): qty 5

## 2018-01-22 MED ORDER — ASPIRIN EC 81 MG PO TBEC
81.0000 mg | DELAYED_RELEASE_TABLET | Freq: Every day | ORAL | Status: DC
  Administered 2018-01-23 – 2018-01-24 (×2): 81 mg via ORAL
  Filled 2018-01-22 (×2): qty 1

## 2018-01-22 MED ORDER — GI COCKTAIL ~~LOC~~
30.0000 mL | Freq: Four times a day (QID) | ORAL | Status: DC | PRN
  Administered 2018-01-22 – 2018-01-23 (×2): 30 mL via ORAL
  Filled 2018-01-22 (×3): qty 30

## 2018-01-22 MED ORDER — MOMETASONE FURO-FORMOTEROL FUM 200-5 MCG/ACT IN AERO
2.0000 | INHALATION_SPRAY | Freq: Two times a day (BID) | RESPIRATORY_TRACT | Status: DC
  Administered 2018-01-22 – 2018-01-25 (×6): 2 via RESPIRATORY_TRACT
  Filled 2018-01-22: qty 8.8

## 2018-01-22 MED ORDER — ENOXAPARIN SODIUM 40 MG/0.4ML ~~LOC~~ SOLN
40.0000 mg | SUBCUTANEOUS | Status: DC
  Administered 2018-01-22 – 2018-01-24 (×3): 40 mg via SUBCUTANEOUS
  Filled 2018-01-22 (×3): qty 0.4

## 2018-01-22 MED ORDER — MORPHINE SULFATE (PF) 2 MG/ML IV SOLN: 2.0000 mg | INTRAVENOUS | Status: DC | PRN

## 2018-01-22 MED ORDER — HYDRALAZINE HCL 20 MG/ML IJ SOLN: 10.0000 mg | INTRAMUSCULAR | Status: DC | PRN

## 2018-01-22 MED ORDER — PANTOPRAZOLE SODIUM 40 MG PO TBEC
40.0000 mg | DELAYED_RELEASE_TABLET | Freq: Every day | ORAL | Status: DC
  Administered 2018-01-23 – 2018-01-25 (×3): 40 mg via ORAL
  Filled 2018-01-22 (×3): qty 1

## 2018-01-22 MED ORDER — ONDANSETRON HCL 4 MG/2ML IJ SOLN: 4.0000 mg | Freq: Four times a day (QID) | INTRAMUSCULAR | Status: DC | PRN

## 2018-01-22 MED ORDER — ACETAMINOPHEN 325 MG PO TABS
650.0000 mg | ORAL_TABLET | Freq: Four times a day (QID) | ORAL | Status: DC | PRN
  Administered 2018-01-25: 650 mg via ORAL
  Filled 2018-01-22: qty 2

## 2018-01-22 MED ORDER — ALBUTEROL SULFATE (2.5 MG/3ML) 0.083% IN NEBU: 3.0000 mL | INHALATION_SOLUTION | RESPIRATORY_TRACT | Status: DC | PRN

## 2018-01-22 MED ORDER — CALCIUM CARBONATE ANTACID 500 MG PO CHEW
500.0000 mg | CHEWABLE_TABLET | Freq: Two times a day (BID) | ORAL | Status: DC
  Administered 2018-01-22 – 2018-01-25 (×6): 500 mg via ORAL
  Filled 2018-01-22 (×6): qty 3

## 2018-01-22 MED ORDER — ALPRAZOLAM 0.5 MG PO TABS
0.2500 mg | ORAL_TABLET | Freq: Two times a day (BID) | ORAL | Status: DC | PRN
  Administered 2018-01-24 – 2018-01-25 (×2): 0.25 mg via ORAL
  Filled 2018-01-22 (×2): qty 1

## 2018-01-22 MED ORDER — OMEGA-3-ACID ETHYL ESTERS 1 G PO CAPS
2.0000 | ORAL_CAPSULE | Freq: Two times a day (BID) | ORAL | Status: DC
  Administered 2018-01-22 – 2018-01-25 (×6): 2 g via ORAL
  Filled 2018-01-22 (×6): qty 2

## 2018-01-22 MED ORDER — NITROGLYCERIN 2 % TD OINT
1.0000 [in_us] | TOPICAL_OINTMENT | Freq: Once | TRANSDERMAL | Status: AC
  Administered 2018-01-22: 1 [in_us] via TOPICAL
  Filled 2018-01-22: qty 1

## 2018-01-22 MED ORDER — METOPROLOL TARTRATE 25 MG PO TABS
75.0000 mg | ORAL_TABLET | Freq: Two times a day (BID) | ORAL | Status: DC
  Administered 2018-01-22 – 2018-01-25 (×6): 75 mg via ORAL
  Filled 2018-01-22 (×6): qty 3

## 2018-01-22 MED ORDER — MIRTAZAPINE 15 MG PO TABS
15.0000 mg | ORAL_TABLET | Freq: Every day | ORAL | Status: DC
  Administered 2018-01-22 – 2018-01-24 (×3): 15 mg via ORAL
  Filled 2018-01-22 (×3): qty 1

## 2018-01-22 MED ORDER — LOSARTAN POTASSIUM 50 MG PO TABS
50.0000 mg | ORAL_TABLET | Freq: Every day | ORAL | Status: DC
  Administered 2018-01-22 – 2018-01-25 (×4): 50 mg via ORAL
  Filled 2018-01-22 (×4): qty 1

## 2018-01-22 MED ORDER — CYANOCOBALAMIN 500 MCG PO TABS
250.0000 ug | ORAL_TABLET | Freq: Every day | ORAL | Status: DC
  Administered 2018-01-23 – 2018-01-25 (×3): 250 ug via ORAL
  Filled 2018-01-22 (×4): qty 1

## 2018-01-22 MED ORDER — ASPIRIN 81 MG PO CHEW
324.0000 mg | CHEWABLE_TABLET | Freq: Once | ORAL | Status: AC
  Administered 2018-01-22: 324 mg via ORAL
  Filled 2018-01-22: qty 4

## 2018-01-22 NOTE — H&P (Addendum)
[facility] NAME: Amy Morton    MR#:  [id number]  DATE OF BIRTH:  1934-11-17  DATE OF ADMISSION:  01/22/2018   PRIMARY CARE PHYSICIAN: [name], MD   REQUESTING/REFERRING PHYSICIAN:   CHIEF COMPLAINT:   Chief Complaint  Patient presents with  . Chest Pain    HISTORY OF PRESENT ILLNESS: Amy Morton  is a 83 y.o. female with a known history per below which also includes ischemic heart disease, presenting to the emergency room with acute mid chest pressure/pain lasting approximately 15 minutes associated with shortness of breath and diaphoresis made better with 3 nitroglycerin tablets at home, in the emergency room patient was found to be hypertensive with systolic blood pressure in the 200s-improving over time, sodium was 128, chloride 92, the rest of her laboratory investigations and EKG were unimpressive, patient evaluated in the emergency room with sister, patient now been admitted for acute angina and uncontrolled hypertension.  PAST MEDICAL HISTORY:   Past Medical History:  Diagnosis Date  . Abnormal mammogram 2007   Recommend close follow up  . Anemia    Iron, b12, SPEP, UPEP normal, 12/2012  . Anxiety   . Aortic stenosis    mild to moderate  . aortic stenosis   . Arthritis   . Asthma   . CAD (coronary artery disease)    s/p angioplasty 1996  . Chronic cough   . Depression   . Diverticulosis   . Dyspnea   . Elevated LFTs 2010   s/p ultrasound w/ possible fatty liver; GI consult. Resolution 2011  . Gastric ulcer   . GERD (gastroesophageal reflux disease)   . History of colonic polyps    Dr. Vira Agar  . HOH (hard of hearing)   . Hyperlipidemia   . Hypertension   . Peritoneal carcinomatosis [facility]) 2001   Dr. Oliva Bustard & Dr. Claiborne Rigg s/p chemo  . Shingles 2008   T-9 distribution  . Valvular heart disease    Mild to Moderate MR, AS  . Vitamin D deficiency   . Wheezing     PAST SURGICAL  HISTORY:  Past Surgical History:  Procedure Laterality Date  . ABDOMINAL HYSTERECTOMY  2001  . APPENDECTOMY    . CARDIAC CATHETERIZATION  2007   50% mid LAD stenosis, 70% proximal RCA with 100% distal RCA stenosis with collaterals, EF 65%  . CARDIAC CATHETERIZATION N/A 04/27/2015   Procedure: Left Heart Cath and Coronary Angiography;  Surgeon: Jettie Booze, MD;  Location: [facility];  Service: Cardiovascular;  Laterality: N/A;  . CARDIAC CATHETERIZATION N/A 04/27/2015   Procedure: Coronary Stent Intervention;  Surgeon: Jettie Booze, MD;  Location: [facility];  Service: Cardiovascular;  Laterality: N/A;  . CATARACT EXTRACTION W/PHACO Left 09/25/2016   Procedure: CATARACT EXTRACTION PHACO AND INTRAOCULAR LENS PLACEMENT (IOC);  Surgeon: Eulogio Bear, MD;  Location: [facility];  Service: Ophthalmology;  Laterality: Left;  [place] 46.7 AP% 8.1 CDE 3.80 Fluid pack lot # [id number]  . CATARACT EXTRACTION W/PHACO Right 11/06/2016   Procedure: CATARACT EXTRACTION PHACO AND INTRAOCULAR LENS PLACEMENT ([place]);  Surgeon: Eulogio Bear, MD;  Location: [facility];  Service: Ophthalmology;  Laterality: Right;  [place] 49.5 AP8.7 CDE4.28 LOT # [id number]  . CHOLECYSTECTOMY  1987  . COLECTOMY  2001  . CORONARY ANGIOPLASTY  1996  . CORONARY ANGIOPLASTY    . LAPAROSCOPIC BILATERAL SALPINGO OOPHERECTOMY  1988   Dr. Laverta Baltimore  . TONSILLECTOMY  1950    SOCIAL HISTORY:  Social History   Tobacco Use  . Smoking status: Never Smoker  . Smokeless tobacco: Never Used  Substance Use Topics  . Alcohol use: No    Alcohol/week: 0.0 oz    FAMILY HISTORY:  Family History  Problem Relation Age of Onset  . Hypertension Mother   . Stroke Mother 72       Cerebral Hemorrhage  . Cancer Father        bone cancer  . Diabetes Sister   . Diabetes Brother   . Heart disease Brother   . Cancer Daughter 3       Ovarian cancer  . Colon cancer Neg Hx   . Colon polyps Neg Hx     DRUG ALLERGIES:   Allergies  Allergen Reactions  . Abilify [Aripiprazole] Other (See Comments)    Burning in Chest Elevated b/p Insomnia    REVIEW OF SYSTEMS:   CONSTITUTIONAL: No fever, fatigue or weakness.  EYES: No blurred or double vision.  EARS, NOSE, AND THROAT: No tinnitus or ear pain.  RESPIRATORY: No cough, +shortness of breath, no frail-appearing wheezing or hemoptysis.  CARDIOVASCULAR: +chest pain,no orthopnea, edema.  GASTROINTESTINAL: No nausea, vomiting, diarrhea or abdominal pain.  GENITOURINARY: No dysuria, hematuria.  ENDOCRINE: No polyuria, nocturia,  HEMATOLOGY: No anemia, easy bruising or bleeding SKIN: No rash or lesion. MUSCULOSKELETAL: No joint pain or arthritis.   NEUROLOGIC: No tingling, numbness, weakness.  PSYCHIATRY: No anxiety or depression.   MEDICATIONS AT HOME:  Prior to Admission medications   Medication Sig Start Date End Date Taking? Authorizing Provider  acetaminophen (TYLENOL) 650 MG CR tablet Take 1,000 mg by mouth 2 (two) times daily.    Yes [provider]  albuterol (PROVENTIL HFA;VENTOLIN HFA) 108 (90 BASE) MCG/ACT inhaler Inhale 2 puffs into the lungs every 4 (four) hours as needed for wheezing or shortness of breath.    Yes [provider]  aspirin 81 MG tablet Take 81 mg by mouth daily.   Yes [provider]  atorvastatin (LIPITOR) 80 MG tablet TAKE 1 TABLET BY MOUTH DAILY AT 6PM 12/29/17  Yes Nahser, Wonda Cheng, MD  calcium gluconate 650 MG tablet Take 650 mg by mouth 2 (two) times daily.   Yes [provider]  Cholecalciferol 5000 units TABS Take 5,000 Units by mouth daily.   Yes [provider]  citalopram (CELEXA) 20 MG tablet TAKE 1 TABLET BY MOUTH DAILY 01/11/18  Yes [name], MD  clopidogrel (PLAVIX) 75 MG tablet TAKE 1 TABLET BY MOUTH DAILY WITH BREAKFAST 12/18/17  Yes Nahser, Wonda Cheng, MD  Fluticasone-Salmeterol (ADVAIR DISKUS) 250-50 MCG/DOSE AEPB Inhale 1 puff into the lungs 2 (two) times  daily. 08/11/17  Yes [name], MD  isosorbide mononitrate (IMDUR) 30 MG 24 hr tablet TAKE 1 TABLET(30 MG) BY MOUTH DAILY 01/13/18  Yes Nahser, Wonda Cheng, MD  metoprolol tartrate (LOPRESSOR) 50 MG tablet TAKE 1 TABLET(50 MG) BY MOUTH TWICE DAILY 04/22/17  Yes Nahser, Wonda Cheng, MD  mirtazapine (REMERON) 7.5 MG tablet TAKE 1 TABLET(7.5 MG) BY MOUTH AT BEDTIME 08/11/17  Yes [name], MD  Multiple Vitamin (MULTIVITAMIN) capsule Take 1 capsule by mouth daily.   Yes [provider]  nitroGLYCERIN (NITROSTAT) 0.4 MG SL tablet PLACE 1 TABLET UNDER THE TONGUE EVERY 5 MINUTES AS NEEDED FOR CHEST PAIN, 3 DOSES MAX 05/02/16  Yes Nahser, Wonda Cheng, MD  omega-3 acid ethyl esters (LOVAZA) 1 g capsule TAKE 2  CAPSULES BY MOUTH TWICE DAILY 11/16/17  Yes [name], MD  pantoprazole (PROTONIX) 40 MG tablet TAKE 1 TABLET(40 MG) BY MOUTH DAILY 02/13/17  Yes Nahser, Wonda Cheng, MD  vitamin B-12 (CYANOCOBALAMIN) 250 MCG tablet Take 250 mcg by mouth daily.   Yes [provider]  metoprolol (LOPRESSOR) 50 MG tablet TAKE 1 TABLET BY MOUTH TWICE DAILY 07/02/16   [name], MD      PHYSICAL EXAMINATION:   VITAL SIGNS: Blood pressure (!) 186/83, pulse 77, temperature 97.7 F (36.5 C), temperature source Oral, resp. rate 18, height 4\' 11"  (1.499 m), weight 63.5 kg (140 lb), SpO2 97 %.  GENERAL:  83 y.o.-year-old patient lying in the bed with no acute distress.  Frail appearing EYES: Pupils equal, round, reactive to light and accommodation. No scleral icterus. Extraocular muscles intact.  HEENT: Head atraumatic, normocephalic. Oropharynx and nasopharynx clear.  NECK:  Supple, no jugular venous distention. No thyroid enlargement, no tenderness.  LUNGS: Normal breath sounds bilaterally, no wheezing, rales,rhonchi or crepitation. No use of accessory muscles of respiration.  CARDIOVASCULAR: S1, S2 normal. No murmurs, rubs, or gallops.  ABDOMEN: Soft, nontender, nondistended. Bowel  sounds present. No organomegaly or mass.  EXTREMITIES: No pedal edema, cyanosis, or clubbing.  NEUROLOGIC: Cranial nerves II through XII are intact. MAES. Gait not checked.  PSYCHIATRIC: The patient is alert and oriented x 3.  SKIN: No obvious rash, lesion, or ulcer.   LABORATORY PANEL:   CBC Recent Labs  Lab 01/22/18 1838  WBC 7.0  HGB 11.5*  HCT 33.3*  PLT 161  MCV 93.6  MCH 32.3  MCHC 34.5  RDW 12.7   ------------------------------------------------------------------------------------------------------------------  Chemistries  Recent Labs  Lab 01/22/18 1838  NA 128*  K 3.6  CL 92*  CO2 27  GLUCOSE 123*  BUN 17  CREATININE 0.74  CALCIUM 8.8*   ------------------------------------------------------------------------------------------------------------------ estimated creatinine clearance is 45.4 mL/min (by C-G formula based on SCr of 0.74 mg/dL). ------------------------------------------------------------------------------------------------------------------ No results for input(s): TSH, T4TOTAL, T3FREE, THYROIDAB in the last 72 hours.  Invalid input(s): FREET3   Coagulation profile No results for input(s): INR, PROTIME in the last 168 hours. ------------------------------------------------------------------------------------------------------------------- No results for input(s): DDIMER in the last 72 hours. -------------------------------------------------------------------------------------------------------------------  Cardiac Enzymes Recent Labs  Lab 01/22/18 1838  TROPONINI <0.03   ------------------------------------------------------------------------------------------------------------------ Invalid input(s): POCBNP  ---------------------------------------------------------------------------------------------------------------  Urinalysis    Component Value Date/Time   COLORURINE Yellow 11/17/2014 2031   APPEARANCEUR Hazy 11/17/2014 2031    LABSPEC 1.014 11/17/2014 2031   PHURINE 5.0 11/17/2014 2031   GLUCOSEU Negative 11/17/2014 2031   HGBUR 1+ 11/17/2014 2031   BILIRUBINUR Negative 11/17/2014 2031   KETONESUR Negative 11/17/2014 2031   PROTEINUR 30 mg/dL 11/17/2014 2031   UROBILINOGEN negative 11/17/2014 1206   NITRITE Negative 11/17/2014 2031   LEUKOCYTESUR 1+ 11/17/2014 2031     RADIOLOGY: Dg Chest 2 View  Result Date: 01/22/2018 CLINICAL DATA:  Chest tightness. EXAM: CHEST - 2 VIEW COMPARISON:  October 08, 2013 FINDINGS: The heart size and mediastinal contours are within normal limits. Both lungs are clear. The visualized skeletal structures are unremarkable. IMPRESSION: No active cardiopulmonary disease. Electronically Signed   By: Dorise Bullion III M.D   On: 01/22/2018 19:01    EKG: Orders placed or performed during the hospital encounter of 01/22/18  . ED EKG within 10 minutes  . ED EKG within 10 minutes  . EKG 12-Lead  . EKG 12-Lead    IMPRESSION AND PLAN: *Acute angina With known ischemic  heart disease admit to telemetry bed on our acute coronary syndrome protocol, Rule out acute coronary syndrome with cardiac enzymes x3 sets, continue DAPT with aspirin/Plavix, statin therapy-check lipids in the morning, increase imdur, Lopressor to 75 mg twice daily given uncontrolled hypertension, scheduled Nitropaste to chest, IV morphine PRN breakthrough pain, supplemental oxygen, check echocardiogram, cardiology consultation for expert opinion as patient may require heart catheterization in the morning for further evaluation  *Chronic benign essential hypertension  Uncontrolled Increase beta-blocker therapy, hydralazine as needed, vitals per routine, make changes as per necessary  *Chronic GERD without esophagitis Stable PPI daily  *Acute hyponatremia/hypochloremia  IV fluids for rehydration and check BMP in the morning  *history of coronary artery disease Plan of care as stated above   All the records  are reviewed and case discussed with ED provider. Management plans discussed with the patient, family and they are in agreement.  CODE STATUS:DNI Code Status History    Date Active Date Inactive Code Status Order ID Comments User Context   04/27/2015 1302 04/29/2015 1500 Full Code [id number]  Jettie Booze, MD Inpatient       TOTAL TIME TAKING CARE OF THIS PATIENT: 45 minutes.    Avel Peace Salary M.D on 01/22/2018   Between 7am to 6pm - Pager - [phone]  After 6pm go to [email or web address] - password [email or web address] [facility]  Office  [phone]  CC: Primary care physician; [name], MD   Note: This dictation was prepared with Dragon dictation along with smaller phrase technology. Any transcriptional errors that result from this process are unintentional.

## 2018-01-22 NOTE — ED Provider Notes (Signed)
.[facility] Emergency Department Provider Note  ____________________________________________   I have reviewed the triage vital signs and the nursing notes. Where available I have reviewed prior notes and, if possible and indicated, outside hospital notes.    HISTORY  Chief Complaint Chest Pain    HPI Amy Morton is a 83 y.o. female with a history of CAD, status post [place] in 1996, status post PCI of the left circumflex in September 2016, at that time she was also noted to have a 70% LAD which was on stent well.  Patient here because she had chest pain which is a "burning" like discomfort which she states is her normal chest pain/anginal equivalent.  To the extent that she can remember any way.  Began at rest this afternoon lasted for about 50 minutes had to take 3 nitroglycerin to make it better.  No other associated symptoms no radiation, gradual onset.  Happened at rest.  No leg swelling.  No nausea no shortness of breath not pleuritic, no abdominal pain.  The pain was across the front of her chest with no other radiation.  She denies any prodrome.  She did take her medications today.  Does have a history of occasionally poorly controlled hypertension.  Patient states that at this time she is pain-free after taking 3 nitro.  This is atypical for her.  She states is been at least a year she believes that she had to use nitroglycerin in the past.  She is very concerned and states the pain was bad enough to "scare me".    Past Medical History:  Diagnosis Date  . Abnormal mammogram 2007   Recommend close follow up  . Anemia    Iron, b12, SPEP, UPEP normal, 12/2012  . Anxiety   . Aortic stenosis    mild to moderate  . aortic stenosis   . Arthritis   . Asthma   . CAD (coronary artery disease)    s/p angioplasty 1996  . Chronic cough   . Depression   . Diverticulosis   . Dyspnea   . Elevated LFTs 2010   s/p ultrasound w/ possible fatty liver; GI  consult. Resolution 2011  . Gastric ulcer   . GERD (gastroesophageal reflux disease)   . History of colonic polyps    Dr. Vira Agar  . HOH (hard of hearing)   . Hyperlipidemia   . Hypertension   . Peritoneal carcinomatosis [facility]) 2001   Dr. Oliva Bustard & Dr. Claiborne Rigg s/p chemo  . Shingles 2008   T-9 distribution  . Valvular heart disease    Mild to Moderate MR, AS  . Vitamin D deficiency   . Wheezing     Patient Active Problem List   Diagnosis Date Noted  . Non-rheumatic mitral regurgitation 02/12/2017  . Orthostatic lightheadedness 10/20/2016  . Arthritis 05/22/2016  . Wheezing 11/21/2015  . Dizziness 09/30/2015  . Peripheral edema 08/27/2015  . CAD (coronary artery disease) 04/27/2015  . Abnormal findings on radiological examination of gastrointestinal tract 11/22/2014  . Abdominal pain, epigastric 11/22/2014  . Right knee pain 07/09/2014  . Long-term use of high-risk medication 05/12/2014  . Anemia 05/12/2014  . Nonrheumatic aortic valve stenosis 04/18/2014  . CAD S/P percutaneous coronary angioplasty 04/18/2014  . History of peritoneal carcinoma 04/17/2014  . Chronic diarrhea 04/17/2014  . Dyslipidemia, goal LDL below 70 04/17/2014  . Sleep disturbance 12/13/2013  . Anxiety 11/13/2013  . Vitamin D deficiency 11/13/2013    Past Surgical History:  Procedure Laterality Date  . ABDOMINAL HYSTERECTOMY  2001  . APPENDECTOMY    . CARDIAC CATHETERIZATION  2007   50% mid LAD stenosis, 70% proximal RCA with 100% distal RCA stenosis with collaterals, EF 65%  . CARDIAC CATHETERIZATION N/A 04/27/2015   Procedure: Left Heart Cath and Coronary Angiography;  Surgeon: Jettie Booze, MD;  Location: [place] CV LAB;  Service: Cardiovascular;  Laterality: N/A;  . CARDIAC CATHETERIZATION N/A 04/27/2015   Procedure: Coronary Stent Intervention;  Surgeon: Jettie Booze, MD;  Location: [place] CV LAB;  Service: Cardiovascular;  Laterality: N/A;  . CATARACT EXTRACTION  W/PHACO Left 09/25/2016   Procedure: CATARACT EXTRACTION PHACO AND INTRAOCULAR LENS PLACEMENT (IOC);  Surgeon: Eulogio Bear, MD;  Location: [facility] ORS;  Service: Ophthalmology;  Laterality: Left;  [place] 46.7 AP% 8.1 CDE 3.80 Fluid pack lot # [id number]  . CATARACT EXTRACTION W/PHACO Right 11/06/2016   Procedure: CATARACT EXTRACTION PHACO AND INTRAOCULAR LENS PLACEMENT ([place]);  Surgeon: Eulogio Bear, MD;  Location: [facility] ORS;  Service: Ophthalmology;  Laterality: Right;  [place] 49.5 AP8.7 CDE4.28 LOT # [id number]  . CHOLECYSTECTOMY  1987  . COLECTOMY  2001  . CORONARY ANGIOPLASTY  1996  . CORONARY ANGIOPLASTY    . LAPAROSCOPIC BILATERAL SALPINGO OOPHERECTOMY  1988   Dr. Laverta Baltimore  . TONSILLECTOMY  1950    Prior to Admission medications   Medication Sig Start Date End Date Taking? Authorizing Provider  acetaminophen (TYLENOL) 650 MG CR tablet Take 1,000 mg by mouth 2 (two) times daily.     [provider]  albuterol (PROVENTIL HFA;VENTOLIN HFA) 108 (90 BASE) MCG/ACT inhaler Inhale 2 puffs into the lungs every 4 (four) hours as needed for wheezing or shortness of breath.     [provider]  aspirin 81 MG tablet Take 81 mg by mouth daily.    [provider]  atorvastatin (LIPITOR) 80 MG tablet TAKE 1 TABLET BY MOUTH DAILY AT 6PM 12/29/17   Nahser, Wonda Cheng, MD  calcium gluconate 650 MG tablet Take 650 mg by mouth 2 (two) times daily.    [provider]  Cholecalciferol 5000 units TABS Take 5,000 Units by mouth daily.    [provider]  citalopram (CELEXA) 20 MG tablet TAKE 1 TABLET BY MOUTH DAILY 01/11/18   [name], MD  clopidogrel (PLAVIX) 75 MG tablet TAKE 1 TABLET BY MOUTH DAILY WITH BREAKFAST 12/18/17   Nahser, Wonda Cheng, MD  Fluticasone-Salmeterol (ADVAIR DISKUS) 250-50 MCG/DOSE AEPB Inhale 1 puff into the lungs 2 (two) times daily. 08/11/17   [name], MD  isosorbide mononitrate (IMDUR) 30 MG 24 hr tablet TAKE 1 TABLET(30 MG) BY MOUTH  DAILY 01/13/18   Nahser, Wonda Cheng, MD  metoprolol (LOPRESSOR) 50 MG tablet TAKE 1 TABLET BY MOUTH TWICE DAILY 07/02/16   [name], MD  metoprolol tartrate (LOPRESSOR) 50 MG tablet TAKE 1 TABLET(50 MG) BY MOUTH TWICE DAILY 04/22/17   Nahser, Wonda Cheng, MD  mirtazapine (REMERON) 7.5 MG tablet TAKE 1 TABLET(7.5 MG) BY MOUTH AT BEDTIME 08/11/17   [name], MD  Multiple Vitamin (MULTIVITAMIN) capsule Take 1 capsule by mouth daily.    [provider]  nitroGLYCERIN (NITROSTAT) 0.4 MG SL tablet PLACE 1 TABLET UNDER THE TONGUE EVERY 5 MINUTES AS NEEDED FOR CHEST PAIN, 3 DOSES MAX 05/02/16   Nahser, Wonda Cheng, MD  omega-3 acid ethyl esters (LOVAZA) 1 g capsule TAKE 2 CAPSULES BY MOUTH TWICE DAILY 11/16/17   Caryl Bis,  Angela Adam, MD  pantoprazole (PROTONIX) 40 MG tablet TAKE 1 TABLET(40 MG) BY MOUTH DAILY 02/13/17   Nahser, Wonda Cheng, MD  Probiotic Product [facility] BIFIDOBLEND) CAPS Take 1 capsule by mouth daily. 07/07/14   Rubbie Battiest, RN  vitamin B-12 (CYANOCOBALAMIN) 250 MCG tablet Take 250 mcg by mouth daily.    [provider]    Allergies Abilify [aripiprazole]  Family History  Problem Relation Age of Onset  . Hypertension Mother   . Stroke Mother 90       Cerebral Hemorrhage  . Cancer Father        bone cancer  . Diabetes Sister   . Diabetes Brother   . Heart disease Brother   . Cancer Daughter 23       Ovarian cancer  . Colon cancer Neg Hx   . Colon polyps Neg Hx     Social History Social History   Tobacco Use  . Smoking status: Never Smoker  . Smokeless tobacco: Never Used  Substance Use Topics  . Alcohol use: No    Alcohol/week: 0.0 oz  . Drug use: No    Review of Systems Constitutional: No fever/chills Eyes: No visual changes. ENT: No sore throat. No stiff neck no neck pain Cardiovascular: Denies chest pain. Respiratory: Denies shortness of breath. Gastrointestinal:   no vomiting.  No diarrhea.  No constipation. Genitourinary: Negative  for dysuria. Musculoskeletal: Negative lower extremity swelling Skin: Negative for rash. Neurological: Negative for severe headaches, focal weakness or numbness.   ____________________________________________   PHYSICAL EXAM:  VITAL SIGNS: ED Triage Vitals  Enc Vitals Group     BP 01/22/18 1837 (!) 201/82     Pulse Rate 01/22/18 1837 77     Resp 01/22/18 1837 18     Temp 01/22/18 1837 97.7 F (36.5 C)     Temp Source 01/22/18 1837 Oral     SpO2 01/22/18 1837 97 %     Weight 01/22/18 1834 140 lb (63.5 kg)     Height 01/22/18 1834 4\' 11"  (1.499 m)     Head Circumference --      Peak Flow --      Pain Score 01/22/18 1834 0     Pain Loc --      Pain Edu? --      Excl. in [place]? --     Constitutional: Alert and oriented. Well appearing and in no acute distress. Eyes: Conjunctivae are normal Head: Atraumatic HEENT: No congestion/rhinnorhea. Mucous membranes are moist.  Oropharynx non-erythematous Neck:   Nontender with no meningismus, no masses, no stridor Cardiovascular: Normal rate, regular rhythm. Grossly normal heart sounds.  Good peripheral circulation. Respiratory: Normal respiratory effort.  No retractions. Lungs CTAB. Abdominal: Soft and nontender. No distention. No guarding no rebound Back:  There is no focal tenderness or step off.  there is no midline tenderness there are no lesions noted. there is no CVA tenderness Musculoskeletal: No lower extremity tenderness, no upper extremity tenderness. No joint effusions, no DVT signs strong distal pulses no edema Neurologic:  Normal speech and language. No gross focal neurologic deficits are appreciated.  Skin:  Skin is warm, dry and intact. No rash noted. Psychiatric: Mood and affect are normal. Speech and behavior are normal.  ____________________________________________   LABS (all labs ordered are listed, but only abnormal results are displayed)  Labs Reviewed  BASIC METABOLIC PANEL - Abnormal; Notable for the  following components:      Result Value   Sodium 128 (*)  Chloride 92 (*)    Glucose, Bld 123 (*)    Calcium 8.8 (*)    All other components within normal limits  CBC - Abnormal; Notable for the following components:   RBC 3.56 (*)    Hemoglobin 11.5 (*)    HCT 33.3 (*)    All other components within normal limits  TROPONIN I    Pertinent labs  results that were available during my care of the patient were reviewed by me and considered in my medical decision making (see chart for details). ____________________________________________  EKG  I personally interpreted any EKGs ordered by me or triage Sinus rhythm rate 70 bpm no acute ST elevation or depression, borderline LAD no ischemia ____________________________________________  RADIOLOGY  Pertinent labs & imaging results that were available during my care of the patient were reviewed by me and considered in my medical decision making (see chart for details). If possible, patient and/or family made aware of any abnormal findings.  Dg Chest 2 View  Result Date: 01/22/2018 CLINICAL DATA:  Chest tightness. EXAM: CHEST - 2 VIEW COMPARISON:  October 08, 2013 FINDINGS: The heart size and mediastinal contours are within normal limits. Both lungs are clear. The visualized skeletal structures are unremarkable. IMPRESSION: No active cardiopulmonary disease. Electronically Signed   By: Dorise Bullion III M.D   On: 01/22/2018 19:01   ____________________________________________    PROCEDURES  Procedure(s) performed: None  Procedures  Critical Care performed: None  ____________________________________________   INITIAL IMPRESSION / ASSESSMENT AND PLAN / ED COURSE  Pertinent labs & imaging results that were available during my care of the patient were reviewed by me and considered in my medical decision making (see chart for details).  No acute distress however did have a significant cardiac history, and had which she describes  as her anginal equivalents today which is usual, requiring several doses of nitro to make it go away which is also unusual.  Given her history we will admit her.  Blood pressure is somewhat elevated this time she is somewhat anxious, is in the 180s at this time, unclear what her baseline is we will continue to watch her.  Patient will be given a full aspirin she only had a baby aspirin today.  I do not think heparin is indicated but I do think that observation is.  Patient very comfortable with this plan.  She states she is anxious about going home because of the chest pain that she had.  Nothing to suggest PE, dissection, myocarditis endocarditis pneumothorax pneumonia etc.  Abdomen is benign.  We will check liver function test as a precaution.    ____________________________________________   FINAL CLINICAL IMPRESSION(S) / ED DIAGNOSES  Final diagnoses:  Chest pain, unspecified type      This chart was dictated using voice recognition software.  Despite best efforts to proofread,  errors can occur which can change meaning.      Schuyler Amor, MD 01/22/18 [phone]

## 2018-01-22 NOTE — ED Notes (Signed)
Report called to RN on floor and pt updated with plan of care.

## 2018-01-22 NOTE — Progress Notes (Signed)
Family Meeting Note  Advance Directive:yes  Today a meeting took place with the Patient.  Patient is able to participate  The following clinical team members were present during this meeting:MD  The following were discussed:Patient's diagnosis: Coronary artery disease, chest pain, aortic stenosis, hypertension, GERD, Patient's progosis: Unable to determine and Goals for treatment: DNI  Additional follow-up to be provided: prn  Time spent during discussion:20 minutes  Gorden Harms, MD

## 2018-01-22 NOTE — ED Triage Notes (Signed)
Pt c/o chest pain/tightness this afternoon and took 3 nitro SL. States the pain was relieved after about 89min. Denies any pain or discomfort at present.

## 2018-01-23 ENCOUNTER — Observation Stay (HOSPITAL_BASED_OUTPATIENT_CLINIC_OR_DEPARTMENT_OTHER)
Admit: 2018-01-23 | Discharge: 2018-01-23 | Disposition: A | Discharge status: Home / Self Care | Payer: Medicare Other | Attending: Family Medicine | Admitting: Family Medicine

## 2018-01-23 DIAGNOSIS — I25119 Atherosclerotic heart disease of native coronary artery with unspecified angina pectoris: Secondary | ICD-10-CM | POA: Diagnosis not present

## 2018-01-23 DIAGNOSIS — I350 Nonrheumatic aortic (valve) stenosis: Secondary | ICD-10-CM

## 2018-01-23 DIAGNOSIS — I10 Essential (primary) hypertension: Secondary | ICD-10-CM | POA: Diagnosis not present

## 2018-01-23 DIAGNOSIS — I208 Other forms of angina pectoris: Secondary | ICD-10-CM | POA: Diagnosis not present

## 2018-01-23 DIAGNOSIS — K219 Gastro-esophageal reflux disease without esophagitis: Secondary | ICD-10-CM | POA: Diagnosis not present

## 2018-01-23 DIAGNOSIS — I2510 Atherosclerotic heart disease of native coronary artery without angina pectoris: Secondary | ICD-10-CM | POA: Diagnosis not present

## 2018-01-23 LAB — TROPONIN I
Troponin I: <0.03 ng/mL (ref <0.03)
Troponin I: <0.03 ng/mL (ref <0.03)

## 2018-01-23 LAB — BASIC METABOLIC PANEL
Anion gap: 9 (ref 5–15)
BUN: 16 mg/dL (ref 8–23)
CO2: 24 mmol/L (ref 22–32)
Calcium: 8.7 mg/dL — ABNORMAL LOW (ref 8.9–10.3)
Chloride: 98 mmol/L (ref 98–111)
Creatinine, Ser: 0.6 mg/dL (ref 0.44–1.00)
GFR calc Af Amer: >60 mL/min (ref >60)
GFR calc non Af Amer: >60 mL/min (ref >60)
Glucose, Bld: 104 mg/dL — ABNORMAL HIGH (ref 70–99)
Potassium: 3.7 mmol/L (ref 3.5–5.1)
Sodium: 131 mmol/L — ABNORMAL LOW (ref 135–145)

## 2018-01-23 NOTE — Progress Notes (Signed)
Per dr. Verdell Carmine if sodium level is greater than 130 okay to discontinue Normal saline. Current sodium is 131

## 2018-01-23 NOTE — Consult Note (Signed)
CARDIOLOGY CONSULT NOTE       Patient ID: Amy Morton MRN: [id number] DOB/AGE: 1935/01/10 83 y.o.  Admit date: 01/22/2018 Referring Physician: Verdell Carmine Primary Physician: [name], MD Primary Cardiologist: Nahser Reason for Consultation: Chest pain  Active Problems:   Angina at rest [facility])   HPI:  83 y.o. admitted with SSCP. History of CAD. PCI circumflex in 2016. Had total RCA with collaterals and moderate LAD disease at that time as well. Last seen by Dr Acie Fredrickson 09/03/17 and had been doing well other than bad reaction to abilify.  Admitted last night with SSCP. Similar to her previous angina. Took 3 nitro at home with improvement. Pain associated with diaphoresis and dyspnea. Pain free in ER No acute ECG changes and troponin negative. BP noted to be quite high in ER. Patient indicated being scared by pain. Had not been bothered by exertional chest discomfort prior to last night. Indicates compliance with meds   ROS All other systems reviewed and negative except as noted above  Past Medical History:  Diagnosis Date  . Abnormal mammogram 2007   Recommend close follow up  . Anemia    Iron, b12, SPEP, UPEP normal, 12/2012  . Anxiety   . Aortic stenosis    mild to moderate  . aortic stenosis   . Arthritis   . Asthma   . CAD (coronary artery disease)    s/p angioplasty 1996  . Chronic cough   . Depression   . Diverticulosis   . Dyspnea   . Elevated LFTs 2010   s/p ultrasound w/ possible fatty liver; GI consult. Resolution 2011  . Gastric ulcer   . GERD (gastroesophageal reflux disease)   . History of colonic polyps    Dr. Vira Agar  . HOH (hard of hearing)   . Hyperlipidemia   . Hypertension   . Peritoneal carcinomatosis [facility]) 2001   Dr. Oliva Bustard & Dr. Claiborne Rigg s/p chemo  . Shingles 2008   T-9 distribution  . Valvular heart disease    Mild to Moderate MR, AS  . Vitamin D deficiency   . Wheezing     Family History  Problem Relation Age of Onset  .  Hypertension Mother   . Stroke Mother 39       Cerebral Hemorrhage  . Cancer Father        bone cancer  . Diabetes Sister   . Diabetes Brother   . Heart disease Brother   . Cancer Daughter 69       Ovarian cancer  . Colon cancer Neg Hx   . Colon polyps Neg Hx     Social History   Socioeconomic History  . Marital status: Widowed    Spouse name: Not on file  . Number of children: 3  . Years of education: Not on file  . Highest education level: Not on file  Occupational History  . Occupation: [employer]    Comment: Retired  . 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 Financial resource strain: Not on file  . Food insecurity:    Worry: Not on file    Inability: Not on file  . Transportation needs:    Medical: Not on file    Non-medical: Not on file  Tobacco Use  . Smoking status: Never Smoker  . Smokeless tobacco: Never Used  Substance and Sexual Activity  . Alcohol use: No    Alcohol/week: 0.0 oz  . Drug use:  No  . Sexual activity: Never  Lifestyle  . Physical activity:    Days per week: Not on file    Minutes per session: Not on file  . Stress: Not on file  Relationships  . Social connections:    Talks on phone: Not on file    Gets together: Not on file    Attends religious service: Not on file    Active member of club or organization: Not on file    Attends meetings of clubs or organizations: Not on file    Relationship status: Not on file  . Intimate partner violence:    Fear of current or ex partner: Not on file    Emotionally abused: Not on file    Physically abused: Not on file    Forced sexual activity: Not on file  Other Topics Concern  . Not on file  Social History Narrative   Pt lives in [place] by herself. She is widowed and has a daughter [name]), son [name]). She also had a son that was killed in a work related accident [name] - died age).       Caffeine - none   Exercise - walking    Past Surgical  History:  Procedure Laterality Date  . ABDOMINAL HYSTERECTOMY  2001  . APPENDECTOMY    . CARDIAC CATHETERIZATION  2007   50% mid LAD stenosis, 70% proximal RCA with 100% distal RCA stenosis with collaterals, EF 65%  . CARDIAC CATHETERIZATION N/A 04/27/2015   Procedure: Left Heart Cath and Coronary Angiography;  Surgeon: Jettie Booze, MD;  Location: [place] CV LAB;  Service: Cardiovascular;  Laterality: N/A;  . CARDIAC CATHETERIZATION N/A 04/27/2015   Procedure: Coronary Stent Intervention;  Surgeon: Jettie Booze, MD;  Location: [place] CV LAB;  Service: Cardiovascular;  Laterality: N/A;  . CATARACT EXTRACTION W/PHACO Left 09/25/2016   Procedure: CATARACT EXTRACTION PHACO AND INTRAOCULAR LENS PLACEMENT (IOC);  Surgeon: Eulogio Bear, MD;  Location: [facility] ORS;  Service: Ophthalmology;  Laterality: Left;  [place] 46.7 AP% 8.1 CDE 3.80 Fluid pack lot # [id number]  . CATARACT EXTRACTION W/PHACO Right 11/06/2016   Procedure: CATARACT EXTRACTION PHACO AND INTRAOCULAR LENS PLACEMENT ([place]);  Surgeon: Eulogio Bear, MD;  Location: [facility] ORS;  Service: Ophthalmology;  Laterality: Right;  [place] 49.5 AP8.7 CDE4.28 LOT # [id number]  . CHOLECYSTECTOMY  1987  . COLECTOMY  2001  . CORONARY ANGIOPLASTY  1996  . CORONARY ANGIOPLASTY    . LAPAROSCOPIC BILATERAL SALPINGO OOPHERECTOMY  1988   Dr. Laverta Baltimore  . TONSILLECTOMY  1950     . aspirin EC  81 mg Oral Daily  . atorvastatin  80 mg Oral q1800  . calcium carbonate  500 mg Oral BID  . cholecalciferol  5,000 Units Oral Daily  . citalopram  20 mg Oral Daily  . clopidogrel  75 mg Oral Q breakfast  . enoxaparin (LOVENOX) injection  40 mg Subcutaneous Q24H  . isosorbide mononitrate  30 mg Oral Daily  . losartan  50 mg Oral Daily  . metoprolol tartrate  75 mg Oral BID  . mirtazapine  15 mg Oral QHS  . mometasone-formoterol  2 puff Inhalation BID  . multivitamin with minerals  1 tablet Oral Daily  . nitroGLYCERIN  1 inch Topical Q6H  . omega-3  acid ethyl esters  2 capsule Oral BID  . pantoprazole  40 mg Oral Daily  . vitamin B-12  250 mcg Oral Daily   . sodium chloride 75 mL/hr  at 01/22/18 2135    Physical Exam: Blood pressure (!) 153/79, pulse 61, temperature 98.3 F (36.8 C), temperature source Oral, resp. rate 18, height 4\' 11"  (1.499 m), weight 146 lb 8 oz (66.5 kg), SpO2 97 %.   Affect appropriate Elderly white female  HEENT: normal Neck supple with no adenopathy JVP normal no bruits no thyromegaly Lungs clear with no wheezing and good diaphragmatic motion Heart:  S1/S2 AS murmur  murmur, no rub, gallop or click PMI normal Abdomen: benighn, BS positve, no tenderness, no AAA no bruit.  No HSM or HJR Distal pulses intact with no bruits No edema Neuro non-focal Skin warm and dry No muscular weakness Arthritic changes in hands    Labs:   Lab Results  Component Value Date   WBC 7.0 01/22/2018   HGB 11.5 (L) 01/22/2018   HCT 33.3 (L) 01/22/2018   MCV 93.6 01/22/2018   PLT 161 01/22/2018    Recent Labs  Lab 01/22/18 1838  NA 128*  K 3.6  CL 92*  CO2 27  BUN 17  CREATININE 0.74  CALCIUM 8.8*  PROT 7.2  BILITOT 0.4  ALKPHOS 39  ALT 35  AST 45*  GLUCOSE 123*   Lab Results  Component Value Date   TROPONINI <0.03 01/23/2018    Lab Results  Component Value Date   CHOL 115 09/03/2017   CHOL 105 02/12/2017   CHOL 88 (L) 04/28/2016   Lab Results  Component Value Date   HDL 50 09/03/2017   HDL 43 02/12/2017   HDL 42 (L) 04/28/2016   Lab Results  Component Value Date   LDLCALC 29 09/03/2017   LDLCALC 37 02/12/2017   LDLCALC 16 04/28/2016   Lab Results  Component Value Date   TRIG 179 (H) 09/03/2017   TRIG 127 02/12/2017   TRIG 151 (H) 04/28/2016   Lab Results  Component Value Date   CHOLHDL 2.3 09/03/2017   CHOLHDL 2.4 02/12/2017   CHOLHDL 2.1 04/28/2016   No results found for: LDLDIRECT    Radiology: Dg Chest 2 View  Result Date: 01/22/2018 CLINICAL DATA:  Chest tightness.  EXAM: CHEST - 2 VIEW COMPARISON:  October 08, 2013 FINDINGS: The heart size and mediastinal contours are within normal limits. Both lungs are clear. The visualized skeletal structures are unremarkable. IMPRESSION: No active cardiopulmonary disease. Electronically Signed   By: Dorise Bullion III M.D   On: 01/22/2018 19:01    EKG: NSR no acute ischemic changes   ASSESSMENT AND PLAN:   Chest Pain:  Likely angina. Relief with nitro. Non invasive risk stratification not likely helpful given known collateralized RCA. 3 years ago she had moderate LAD disease and has had stents to circumflex Discussed options with patient and favor diagnostic cath with Dr Fletcher Anon on Monday Risks including stroke, MI, bleeding contrast reaction and need for immediate surgery discussed willing to proceed. She had no acute ECG changes and troponin negative so no heparin for now unless she has recurrence Continue DAT, nitrates and beta blocker  Murmur:  TTE done 2018 with moderate AS mean gradient 23 mmHg has one ordered for today per primary  Service   HTN:  Improved if stays elevated can consider increasing cozaar to 100 mg   Signed: Jenkins Rouge 01/23/2018, 8:18 AM

## 2018-01-23 NOTE — Progress Notes (Signed)
[place] at [place] NAME: Amy Morton    MR#:  [id number]  DATE OF BIRTH:  Oct 03, 1934  SUBJECTIVE:   Patient here due to chest pain.  Does have history of coronary artery disease.  Cardiac markers have been negative.  Seen by cardiology this morning and plan for cardiac catheterization on Monday.  Patient is clinically asymptomatic presently.  REVIEW OF SYSTEMS:    Review of Systems  Constitutional: Negative for chills and fever.  HENT: Negative for congestion and tinnitus.   Eyes: Negative for blurred vision and double vision.  Respiratory: Negative for cough, shortness of breath and wheezing.   Cardiovascular: Negative for chest pain, orthopnea and PND.  Gastrointestinal: Negative for abdominal pain, diarrhea, nausea and vomiting.  Genitourinary: Negative for dysuria and hematuria.  Neurological: Negative for dizziness, sensory change and focal weakness.  All other systems reviewed and are negative.   Nutrition:Heart healthy Tolerating Diet: yes Tolerating PT: Await Eval.   DRUG ALLERGIES:   Allergies  Allergen Reactions  . Abilify [Aripiprazole] Other (See Comments)    Burning in Chest Elevated b/p Insomnia    VITALS:  Blood pressure (!) 153/79, pulse 61, temperature 98.3 F (36.8 C), temperature source Oral, resp. rate 18, height 4\' 11"  (1.499 m), weight 66.5 kg (146 lb 8 oz), SpO2 97 %.  PHYSICAL EXAMINATION:   Physical Exam  GENERAL:  83 y.o.-year-old patient lying in bed in no acute distress.  EYES: Pupils equal, round, reactive to light and accommodation. No scleral icterus. Extraocular muscles intact.  HEENT: Head atraumatic, normocephalic. Oropharynx and nasopharynx clear.  NECK:  Supple, no jugular venous distention. No thyroid enlargement, no tenderness.  LUNGS: Normal breath sounds bilaterally, no wheezing, rales, rhonchi. No use of accessory muscles of respiration.  CARDIOVASCULAR: S1, S2 normal. II/VI  SEM at RSB, No rubs, or gallops.  ABDOMEN: Soft, nontender, nondistended. Bowel sounds present. No organomegaly or mass.  EXTREMITIES: No cyanosis, clubbing or edema b/l.    NEUROLOGIC: Cranial nerves II through XII are intact. No focal Motor or sensory deficits b/l.   PSYCHIATRIC: The patient is alert and oriented x 3.  SKIN: No obvious rash, lesion, or ulcer.    LABORATORY PANEL:   CBC Recent Labs  Lab 01/22/18 1838  WBC 7.0  HGB 11.5*  HCT 33.3*  PLT 161   ------------------------------------------------------------------------------------------------------------------  Chemistries  Recent Labs  Lab 01/22/18 1838 01/23/18 0448  NA 128* 131*  K 3.6 3.7  CL 92* 98  CO2 27 24  GLUCOSE 123* 104*  BUN 17 16  CREATININE 0.74 0.60  CALCIUM 8.8* 8.7*  AST 45*  --   ALT 35  --   ALKPHOS 39  --   BILITOT 0.4  --    ------------------------------------------------------------------------------------------------------------------  Cardiac Enzymes Recent Labs  Lab 01/23/18 0448  TROPONINI <0.03   ------------------------------------------------------------------------------------------------------------------  RADIOLOGY:  Dg Chest 2 View  Result Date: 01/22/2018 CLINICAL DATA:  Chest tightness. EXAM: CHEST - 2 VIEW COMPARISON:  October 08, 2013 FINDINGS: The heart size and mediastinal contours are within normal limits. Both lungs are clear. The visualized skeletal structures are unremarkable. IMPRESSION: No active cardiopulmonary disease. Electronically Signed   By: Dorise Bullion III M.D   On: 01/22/2018 19:01     ASSESSMENT AND PLAN:   83 year old female with past medical history of hypertension, hyperlipidemia, history of coronary artery disease, anxiety, depression who presents to the hospital due to chest pain/pressure.  1. Chest pain-patient does have significant  risk factors given history of coronary disease status post stents in the past. -Her chest pain has  now resolved, her cardiac markers x3 have been negative.  Seen by cardiology but given her good story and multiple risk factors the plan on doing a cardiac catheterization on Monday. - Continue aspirin, metoprolol, Plavix, Imdur, losartan, atorvastatin.  2.  Essential hypertension-continue losartan, metoprolol, Imdur.  3.  Anxiety/depression-continue Xanax, Celexa.  4.  GERD-continue Protonix.   All the records are reviewed and case discussed with Care Management/Social Worker. Management plans discussed with the patient, family and they are in agreement.  CODE STATUS: Partial Code  DVT Prophylaxis: Lovenox  TOTAL TIME TAKING CARE OF THIS PATIENT: 30 minutes.   POSSIBLE D/C IN 2-3 DAYS, DEPENDING ON CLINICAL CONDITION.   Henreitta Leber M.D on 01/23/2018 at 12:49 PM  Between 7am to 6pm - Pager - [phone]  After 6pm go to [email or web address] - Technical brewer [facility]  Office  [phone]  CC: Primary care physician; [name], MD

## 2018-01-23 NOTE — Care Management Obs Status (Signed)
[facility] NOTIFICATION   Patient Details  Name: CHARNELLE BERGEMAN MRN: [id number] Date of Birth: October 29, 1934   Medicare Observation Status Notification Given:  Yes    Maribeth Jiles A Dezra Mandella, RN 01/23/2018, 12:13 PM

## 2018-01-23 NOTE — Care Management Note (Signed)
Case Management Note  Patient Details  Name: Amy Morton MRN: [id number] Date of Birth: [birth year]  Subjective/Objective:     Patient admitted to [facility] under observation status for angina at rest. [facility] consulted on patient to provide MOON letter and complete assessment. Patient currently lives alone. Able to complete all activities of daily living without issue. Requires no DME. Still drives. PCP is Dr Caryl Bis with whom she follows regularly. [name] [phone] is her sister and closest support person. Utilizes [facility] pharmacy and obtains medications without issues.                 Action/Plan: RNCM to continue to follow for any needs.   Expected Discharge Date:                  Expected Discharge Plan:     In-House Referral:     Discharge planning Services     Post Acute Care Choice:    Choice offered to:     DME Arranged:    DME Agency:     HH Arranged:    HH Agency:     Status of Service:     If discussed at [employer], dates discussed:    Additional Comments:  Latanya Maudlin, RN 01/23/2018, 12:09 PM

## 2018-01-24 DIAGNOSIS — K219 Gastro-esophageal reflux disease without esophagitis: Secondary | ICD-10-CM | POA: Diagnosis not present

## 2018-01-24 DIAGNOSIS — I10 Essential (primary) hypertension: Secondary | ICD-10-CM | POA: Diagnosis not present

## 2018-01-24 DIAGNOSIS — I25119 Atherosclerotic heart disease of native coronary artery with unspecified angina pectoris: Secondary | ICD-10-CM | POA: Diagnosis not present

## 2018-01-24 DIAGNOSIS — I350 Nonrheumatic aortic (valve) stenosis: Secondary | ICD-10-CM | POA: Diagnosis not present

## 2018-01-24 DIAGNOSIS — R079 Chest pain, unspecified: Secondary | ICD-10-CM | POA: Diagnosis not present

## 2018-01-24 DIAGNOSIS — I2510 Atherosclerotic heart disease of native coronary artery without angina pectoris: Secondary | ICD-10-CM | POA: Diagnosis not present

## 2018-01-24 DIAGNOSIS — I208 Other forms of angina pectoris: Secondary | ICD-10-CM | POA: Diagnosis not present

## 2018-01-24 LAB — ECHOCARDIOGRAM COMPLETE
Height: 59 in
Weight: 2344 oz

## 2018-01-24 MED ORDER — SODIUM CHLORIDE 0.9 % WEIGHT BASED INFUSION: 1.0000 mL/kg/h | INTRAVENOUS | Status: DC

## 2018-01-24 MED ORDER — SODIUM CHLORIDE 0.9 % WEIGHT BASED INFUSION
3.0000 mL/kg/h | INTRAVENOUS | Status: DC
  Administered 2018-01-25: 3 mL/kg/h via INTRAVENOUS

## 2018-01-24 MED ORDER — SODIUM CHLORIDE 0.9% FLUSH
3.0000 mL | Freq: Two times a day (BID) | INTRAVENOUS | Status: DC
  Administered 2018-01-24: 3 mL via INTRAVENOUS

## 2018-01-24 MED ORDER — ASPIRIN 81 MG PO CHEW
81.0000 mg | CHEWABLE_TABLET | ORAL | Status: AC
  Administered 2018-01-25: 81 mg via ORAL
  Filled 2018-01-24: qty 1

## 2018-01-24 MED ORDER — SODIUM CHLORIDE 0.9% FLUSH: 3.0000 mL | INTRAVENOUS | Status: DC | PRN

## 2018-01-24 MED ORDER — SODIUM CHLORIDE 0.9 % IV SOLN: 250.0000 mL | INTRAVENOUS | Status: DC | PRN

## 2018-01-24 NOTE — H&P (View-Only) (Signed)
Subjective:  Denies SSCP, palpitations or Dyspnea   Objective:  Vitals:   01/23/18 0805 01/23/18 1622 01/23/18 2007 01/24/18 0420  BP: (!) 153/79 (!) 157/72 (!) 152/63 128/62  Pulse:  68 64 (!) 59  Resp:  18    Temp:  97.7 F (36.5 C)  97.7 F (36.5 C)  TempSrc:  Oral  Oral  SpO2:  97% 97% 93%  Weight:      Height:        Intake/Output from previous day:  Intake/Output Summary (Last 24 hours) at 01/24/2018 0930 Last data filed at 01/24/2018 0029 Gross per 24 hour  Intake 1103.75 ml  Output 2250 ml  Net -1146.25 ml    Physical Exam: Affect appropriate Elderly white female  HEENT: normal Neck supple with no adenopathy JVP normal no bruits no thyromegaly Lungs mild rhonchi  and good diaphragmatic motion Heart:  S1/S2 moderate AS  murmur, no rub, gallop or click PMI normal Abdomen: benighn, BS positve, no tenderness, no AAA no bruit.  No HSM or HJR Distal pulses intact with no bruits No edema Neuro non-focal Skin warm and dry No muscular weakness   Lab Results: Basic Metabolic Panel: Recent Labs    01/22/18 1838 01/23/18 0448  NA 128* 131*  K 3.6 3.7  CL 92* 98  CO2 27 24  GLUCOSE 123* 104*  BUN 17 16  CREATININE 0.74 0.60  CALCIUM 8.8* 8.7*   Liver Function Tests: Recent Labs    01/22/18 1838  AST 45*  ALT 35  ALKPHOS 39  BILITOT 0.4  PROT 7.2  ALBUMIN 4.2   Recent Labs    01/22/18 1838  LIPASE 36   CBC: Recent Labs    01/22/18 1838  WBC 7.0  HGB 11.5*  HCT 33.3*  MCV 93.6  PLT 161   Cardiac Enzymes: Recent Labs    01/22/18 2132 01/23/18 0201 01/23/18 0448  TROPONINI <0.03 <0.03 <0.03    Imaging: Dg Chest 2 View  Result Date: 01/22/2018 CLINICAL DATA:  Chest tightness. EXAM: CHEST - 2 VIEW COMPARISON:  October 08, 2013 FINDINGS: The heart size and mediastinal contours are within normal limits. Both lungs are clear. The visualized skeletal structures are unremarkable. IMPRESSION: No active cardiopulmonary disease.  Electronically Signed   By: Dorise Bullion III M.D   On: 01/22/2018 19:01    Cardiac Studies:  ECG: NSR LAD no acute changes    Telemetry:  NSR 01/24/2018   Echo: EF 65-70% moderate AS stable mean gradient 21 mmHg moderate MR 01/23/18  Medications:   . aspirin EC  81 mg Oral Daily  . atorvastatin  80 mg Oral q1800  . calcium carbonate  500 mg Oral BID  . cholecalciferol  5,000 Units Oral Daily  . citalopram  20 mg Oral Daily  . clopidogrel  75 mg Oral Q breakfast  . enoxaparin (LOVENOX) injection  40 mg Subcutaneous Q24H  . isosorbide mononitrate  30 mg Oral Daily  . losartan  50 mg Oral Daily  . metoprolol tartrate  75 mg Oral BID  . mirtazapine  15 mg Oral QHS  . mometasone-formoterol  2 puff Inhalation BID  . multivitamin with minerals  1 tablet Oral Daily  . nitroGLYCERIN  1 inch Topical Q6H  . omega-3 acid ethyl esters  2 capsule Oral BID  . pantoprazole  40 mg Oral Daily  . vitamin B-12  250 mcg Oral Daily      Assessment/Plan:  Chest Pain.  Likely angina.  Relief with nitro. Non invasive risk stratification not likely helpful given known collateralized RCA. 3 years ago she had moderate LAD disease and has had stents to circumflex Discussed options with patient and favor diagnostic cath with Dr Fletcher Anon on Monday Risks including stroke, MI, bleeding contrast reaction and need for immediate surgery discussed willing to proceed. She had no acute ECG changes and troponin negative so no heparin for now unless she has recurrence Continue DAT, nitrates and beta blocker TTE with normal EF and no RWMAls  AS:  Stable moderate mean gradient 21 mmHg no need for right heart cath   HTN: Well controlled.  Continue current medications and low sodium Dash type diet.       Jenkins Rouge 01/24/2018, 9:30 AM

## 2018-01-24 NOTE — Progress Notes (Signed)
[place] at [place] NAME: Amy Morton    MR#:  [id number]  DATE OF BIRTH:  03/15/1935  SUBJECTIVE:   No acute events overnight. Plan for Cardiac Catherization tomorrow.   REVIEW OF SYSTEMS:    Review of Systems  Constitutional: Negative for chills and fever.  HENT: Negative for congestion and tinnitus.   Eyes: Negative for blurred vision and double vision.  Respiratory: Negative for cough, shortness of breath and wheezing.   Cardiovascular: Negative for chest pain, orthopnea and PND.  Gastrointestinal: Negative for abdominal pain, diarrhea, nausea and vomiting.  Genitourinary: Negative for dysuria and hematuria.  Neurological: Negative for dizziness, sensory change and focal weakness.  All other systems reviewed and are negative.   Nutrition:Heart healthy Tolerating Diet: yes Tolerating PT: Await Eval.   DRUG ALLERGIES:   Allergies  Allergen Reactions  . Abilify [Aripiprazole] Other (See Comments)    Burning in Chest Elevated b/p Insomnia    VITALS:  Blood pressure (!) 164/72, pulse 66, temperature 97.7 F (36.5 C), temperature source Oral, resp. rate 18, height 4\' 11"  (1.499 m), weight 66.5 kg (146 lb 8 oz), SpO2 95 %.  PHYSICAL EXAMINATION:   Physical Exam  GENERAL:  83 y.o.-year-old patient lying in bed in no acute distress.  EYES: Pupils equal, round, reactive to light and accommodation. No scleral icterus. Extraocular muscles intact.  HEENT: Head atraumatic, normocephalic. Oropharynx and nasopharynx clear.  NECK:  Supple, no jugular venous distention. No thyroid enlargement, no tenderness.  LUNGS: Normal breath sounds bilaterally, no wheezing, rales, rhonchi. No use of accessory muscles of respiration.  CARDIOVASCULAR: S1, S2 normal. II/VI SEM at RSB, No rubs, or gallops.  ABDOMEN: Soft, nontender, nondistended. Bowel sounds present. No organomegaly or mass.  EXTREMITIES: No cyanosis, clubbing or edema b/l.     NEUROLOGIC: Cranial nerves II through XII are intact. No focal Motor or sensory deficits b/l.   PSYCHIATRIC: The patient is alert and oriented x 3.  SKIN: No obvious rash, lesion, or ulcer.    LABORATORY PANEL:   CBC Recent Labs  Lab 01/22/18 1838  WBC 7.0  HGB 11.5*  HCT 33.3*  PLT 161   ------------------------------------------------------------------------------------------------------------------  Chemistries  Recent Labs  Lab 01/22/18 1838 01/23/18 0448  NA 128* 131*  K 3.6 3.7  CL 92* 98  CO2 27 24  GLUCOSE 123* 104*  BUN 17 16  CREATININE 0.74 0.60  CALCIUM 8.8* 8.7*  AST 45*  --   ALT 35  --   ALKPHOS 39  --   BILITOT 0.4  --    ------------------------------------------------------------------------------------------------------------------  Cardiac Enzymes Recent Labs  Lab 01/23/18 0448  TROPONINI <0.03   ------------------------------------------------------------------------------------------------------------------  RADIOLOGY:  Dg Chest 2 View  Result Date: 01/22/2018 CLINICAL DATA:  Chest tightness. EXAM: CHEST - 2 VIEW COMPARISON:  October 08, 2013 FINDINGS: The heart size and mediastinal contours are within normal limits. Both lungs are clear. The visualized skeletal structures are unremarkable. IMPRESSION: No active cardiopulmonary disease. Electronically Signed   By: Dorise Bullion III M.D   On: 01/22/2018 19:01     ASSESSMENT AND PLAN:   83 year old female with past medical history of hypertension, hyperlipidemia, history of coronary artery disease, anxiety, depression who presents to the hospital due to chest pain/pressure.  1. Chest pain-patient does have significant risk factors given history of coronary disease status post stents in the past. -Her chest pain has now resolved, her cardiac markers x3 have been negative.  Seen by  cardiology and  Non invasive risk stratification not likely helpful given known collateralized RCA. 3 years  ago she had moderate LAD disease and has had stents to circumflex  - Plan for Cardiac catherization tomorrow.  - Continue aspirin, metoprolol, Plavix, Imdur, losartan, atorvastatin.  2.  Essential hypertension-continue losartan, metoprolol, Imdur.  3.  Anxiety/depression-continue Xanax, Celexa.  4.  GERD-continue Protonix.  Dispo - pending cardiac cath tomorrow.   All the records are reviewed and case discussed with Care Management/Social Worker. Management plans discussed with the patient, family and they are in agreement.  CODE STATUS: Partial Code  DVT Prophylaxis: Lovenox  TOTAL TIME TAKING CARE OF THIS PATIENT: 25 minutes.   POSSIBLE D/C IN 2-3 DAYS, DEPENDING ON CLINICAL CONDITION.   Henreitta Leber M.D on 01/24/2018 at 12:14 PM  Between 7am to 6pm - Pager - [phone]  After 6pm go to [email or web address] - Technical brewer [facility]  Office  [phone]  CC: Primary care physician; [name], MD

## 2018-01-24 NOTE — Progress Notes (Signed)
Subjective:  Denies SSCP, palpitations or Dyspnea   Objective:  Vitals:   01/23/18 0805 01/23/18 1622 01/23/18 2007 01/24/18 0420  BP: (!) 153/79 (!) 157/72 (!) 152/63 128/62  Pulse:  68 64 (!) 59  Resp:  18    Temp:  97.7 F (36.5 C)  97.7 F (36.5 C)  TempSrc:  Oral  Oral  SpO2:  97% 97% 93%  Weight:      Height:        Intake/Output from previous day:  Intake/Output Summary (Last 24 hours) at 01/24/2018 0930 Last data filed at 01/24/2018 0029 Gross per 24 hour  Intake 1103.75 ml  Output 2250 ml  Net -1146.25 ml    Physical Exam: Affect appropriate Elderly white female  HEENT: normal Neck supple with no adenopathy JVP normal no bruits no thyromegaly Lungs mild rhonchi  and good diaphragmatic motion Heart:  S1/S2 moderate AS  murmur, no rub, gallop or click PMI normal Abdomen: benighn, BS positve, no tenderness, no AAA no bruit.  No HSM or HJR Distal pulses intact with no bruits No edema Neuro non-focal Skin warm and dry No muscular weakness   Lab Results: Basic Metabolic Panel: Recent Labs    01/22/18 1838 01/23/18 0448  NA 128* 131*  K 3.6 3.7  CL 92* 98  CO2 27 24  GLUCOSE 123* 104*  BUN 17 16  CREATININE 0.74 0.60  CALCIUM 8.8* 8.7*   Liver Function Tests: Recent Labs    01/22/18 1838  AST 45*  ALT 35  ALKPHOS 39  BILITOT 0.4  PROT 7.2  ALBUMIN 4.2   Recent Labs    01/22/18 1838  LIPASE 36   CBC: Recent Labs    01/22/18 1838  WBC 7.0  HGB 11.5*  HCT 33.3*  MCV 93.6  PLT 161   Cardiac Enzymes: Recent Labs    01/22/18 2132 01/23/18 0201 01/23/18 0448  TROPONINI <0.03 <0.03 <0.03    Imaging: Dg Chest 2 View  Result Date: 01/22/2018 CLINICAL DATA:  Chest tightness. EXAM: CHEST - 2 VIEW COMPARISON:  October 08, 2013 FINDINGS: The heart size and mediastinal contours are within normal limits. Both lungs are clear. The visualized skeletal structures are unremarkable. IMPRESSION: No active cardiopulmonary disease.  Electronically Signed   By: Dorise Bullion III M.D   On: 01/22/2018 19:01    Cardiac Studies:  ECG: NSR LAD no acute changes    Telemetry:  NSR 01/24/2018   Echo: EF 65-70% moderate AS stable mean gradient 21 mmHg moderate MR 01/23/18  Medications:   . aspirin EC  81 mg Oral Daily  . atorvastatin  80 mg Oral q1800  . calcium carbonate  500 mg Oral BID  . cholecalciferol  5,000 Units Oral Daily  . citalopram  20 mg Oral Daily  . clopidogrel  75 mg Oral Q breakfast  . enoxaparin (LOVENOX) injection  40 mg Subcutaneous Q24H  . isosorbide mononitrate  30 mg Oral Daily  . losartan  50 mg Oral Daily  . metoprolol tartrate  75 mg Oral BID  . mirtazapine  15 mg Oral QHS  . mometasone-formoterol  2 puff Inhalation BID  . multivitamin with minerals  1 tablet Oral Daily  . nitroGLYCERIN  1 inch Topical Q6H  . omega-3 acid ethyl esters  2 capsule Oral BID  . pantoprazole  40 mg Oral Daily  . vitamin B-12  250 mcg Oral Daily      Assessment/Plan:  Chest Pain.  Likely angina.  Relief with nitro. Non invasive risk stratification not likely helpful given known collateralized RCA. 3 years ago she had moderate LAD disease and has had stents to circumflex Discussed options with patient and favor diagnostic cath with Dr Fletcher Anon on Monday Risks including stroke, MI, bleeding contrast reaction and need for immediate surgery discussed willing to proceed. She had no acute ECG changes and troponin negative so no heparin for now unless she has recurrence Continue DAT, nitrates and beta blocker TTE with normal EF and no RWMAls  AS:  Stable moderate mean gradient 21 mmHg no need for right heart cath   HTN: Well controlled.  Continue current medications and low sodium Dash type diet.       Jenkins Rouge 01/24/2018, 9:30 AM

## 2018-01-25 ENCOUNTER — Encounter
Admission: EM | Disposition: A | Discharge status: Home / Self Care | Payer: Self-pay | Source: Home / Self Care | Attending: Emergency Medicine

## 2018-01-25 DIAGNOSIS — I2510 Atherosclerotic heart disease of native coronary artery without angina pectoris: Secondary | ICD-10-CM | POA: Diagnosis not present

## 2018-01-25 DIAGNOSIS — I25110 Atherosclerotic heart disease of native coronary artery with unstable angina pectoris: Secondary | ICD-10-CM | POA: Diagnosis not present

## 2018-01-25 DIAGNOSIS — I10 Essential (primary) hypertension: Secondary | ICD-10-CM | POA: Diagnosis not present

## 2018-01-25 DIAGNOSIS — K219 Gastro-esophageal reflux disease without esophagitis: Secondary | ICD-10-CM | POA: Diagnosis not present

## 2018-01-25 DIAGNOSIS — I208 Other forms of angina pectoris: Secondary | ICD-10-CM | POA: Diagnosis not present

## 2018-01-25 DIAGNOSIS — I25119 Atherosclerotic heart disease of native coronary artery with unspecified angina pectoris: Secondary | ICD-10-CM | POA: Diagnosis not present

## 2018-01-25 HISTORY — PX: LEFT HEART CATH AND CORONARY ANGIOGRAPHY: CATH118249

## 2018-01-25 SURGERY — LEFT HEART CATH AND CORONARY ANGIOGRAPHY
Anesthesia: Moderate Sedation

## 2018-01-25 MED ORDER — MIDAZOLAM HCL 2 MG/2ML IJ SOLN
INTRAMUSCULAR | Status: DC | PRN
  Administered 2018-01-25: 1 mg via INTRAVENOUS

## 2018-01-25 MED ORDER — FENTANYL CITRATE (PF) 100 MCG/2ML IJ SOLN
INTRAMUSCULAR | Status: DC | PRN
  Administered 2018-01-25: 25 ug via INTRAVENOUS

## 2018-01-25 MED ORDER — SODIUM CHLORIDE 0.9% FLUSH: 3.0000 mL | Freq: Two times a day (BID) | INTRAVENOUS | Status: DC

## 2018-01-25 MED ORDER — SODIUM CHLORIDE 0.9 % IV SOLN: INTRAVENOUS | Status: DC

## 2018-01-25 MED ORDER — FENTANYL CITRATE (PF) 100 MCG/2ML IJ SOLN
INTRAMUSCULAR | Status: AC
  Filled 2018-01-25: qty 2

## 2018-01-25 MED ORDER — HEPARIN (PORCINE) IN NACL 1000-0.9 UT/500ML-% IV SOLN
INTRAVENOUS | Status: AC
  Filled 2018-01-25: qty 500

## 2018-01-25 MED ORDER — SODIUM CHLORIDE 0.9% FLUSH: 3.0000 mL | INTRAVENOUS | Status: DC | PRN

## 2018-01-25 MED ORDER — HEPARIN SODIUM (PORCINE) 1000 UNIT/ML IJ SOLN
INTRAMUSCULAR | Status: DC | PRN
  Administered 2018-01-25: 3000 [IU] via INTRAVENOUS

## 2018-01-25 MED ORDER — HEPARIN SODIUM (PORCINE) 1000 UNIT/ML IJ SOLN
INTRAMUSCULAR | Status: AC
  Filled 2018-01-25: qty 1

## 2018-01-25 MED ORDER — SODIUM CHLORIDE 0.9 % IV SOLN: 250.0000 mL | INTRAVENOUS | Status: DC | PRN

## 2018-01-25 MED ORDER — VERAPAMIL HCL 2.5 MG/ML IV SOLN
INTRAVENOUS | Status: DC | PRN
  Administered 2018-01-25: 2.5 mg via INTRA_ARTERIAL

## 2018-01-25 MED ORDER — LIDOCAINE HCL (PF) 1 % IJ SOLN
INTRAMUSCULAR | Status: AC
  Filled 2018-01-25: qty 30

## 2018-01-25 MED ORDER — MIDAZOLAM HCL 2 MG/2ML IJ SOLN
INTRAMUSCULAR | Status: AC
  Filled 2018-01-25: qty 2

## 2018-01-25 MED ORDER — VERAPAMIL HCL 2.5 MG/ML IV SOLN
INTRAVENOUS | Status: AC
  Filled 2018-01-25: qty 2

## 2018-01-25 SURGICAL SUPPLY — 9 items
CATH INFINITI 5 FR JL3.5 (CATHETERS) ×3 IMPLANT
CATH INFINITI 5FR JK (CATHETERS) ×3 IMPLANT
DEVICE RAD TR BAND REGULAR (VASCULAR PRODUCTS) ×3 IMPLANT
KIT MANI 3VAL PERCEP (MISCELLANEOUS) ×3 IMPLANT
NEEDLE PERC 21GX4CM (NEEDLE) ×3 IMPLANT
PACK CARDIAC CATH (CUSTOM PROCEDURE TRAY) ×3 IMPLANT
SHEATH RAIN RADIAL 21G 6FR (SHEATH) ×3 IMPLANT
WIRE HITORQ VERSACORE ST 145CM (WIRE) ×3 IMPLANT
WIRE ROSEN-J .035X260CM (WIRE) ×3 IMPLANT

## 2018-01-25 NOTE — Progress Notes (Signed)
Pt. Ambulated to BR; tolerated well. BP upon return : 93/75-62-18. Pt. Asymptomatic. Pt. States "this is more like my real BP." Family at bedside conversing with pt.

## 2018-01-25 NOTE — Progress Notes (Signed)
Discharge instructions explained to pt and pts family/ verbalized an understanding/ post procedure radial site care explained to pt/ right radial WNL/ iv and tele will be removed/ will transport off unit via wheelchair.

## 2018-01-25 NOTE — Progress Notes (Signed)
[place] at [place] NAME: Chelesa Weingartner    MR#:  [id number]  DATE OF BIRTH:  11/25/1934  SUBJECTIVE:   No complaints presently. Plan for Cardiac Cath later today.   REVIEW OF SYSTEMS:    Review of Systems  Constitutional: Negative for chills and fever.  HENT: Negative for congestion and tinnitus.   Eyes: Negative for blurred vision and double vision.  Respiratory: Negative for cough, shortness of breath and wheezing.   Cardiovascular: Negative for chest pain, orthopnea and PND.  Gastrointestinal: Negative for abdominal pain, diarrhea, nausea and vomiting.  Genitourinary: Negative for dysuria and hematuria.  Neurological: Negative for dizziness, sensory change and focal weakness.  All other systems reviewed and are negative.   Nutrition:Heart healthy Tolerating Diet: yes Tolerating PT: ambulatory  DRUG ALLERGIES:   Allergies  Allergen Reactions  . Abilify [Aripiprazole] Other (See Comments)    Burning in Chest Elevated b/p Insomnia    VITALS:  Blood pressure (!) 146/75, pulse 63, temperature 97.8 F (36.6 C), temperature source Oral, resp. rate 18, height 4\' 11"  (1.499 m), weight 65.2 kg (143 lb 11.2 oz), SpO2 93 %.  PHYSICAL EXAMINATION:   Physical Exam  GENERAL:  83 y.o.-year-old patient lying in bed in no acute distress.  EYES: Pupils equal, round, reactive to light and accommodation. No scleral icterus. Extraocular muscles intact.  HEENT: Head atraumatic, normocephalic. Oropharynx and nasopharynx clear.  NECK:  Supple, no jugular venous distention. No thyroid enlargement, no tenderness.  LUNGS: Normal breath sounds bilaterally, no wheezing, rales, rhonchi. No use of accessory muscles of respiration.  CARDIOVASCULAR: S1, S2 normal. II/VI SEM at RSB, No rubs, or gallops.  ABDOMEN: Soft, nontender, nondistended. Bowel sounds present. No organomegaly or mass.  EXTREMITIES: No cyanosis, clubbing or edema b/l.     NEUROLOGIC: Cranial nerves II through XII are intact. No focal Motor or sensory deficits b/l.   PSYCHIATRIC: The patient is alert and oriented x 3.  SKIN: No obvious rash, lesion, or ulcer.    LABORATORY PANEL:   CBC Recent Labs  Lab 01/22/18 1838  WBC 7.0  HGB 11.5*  HCT 33.3*  PLT 161   ------------------------------------------------------------------------------------------------------------------  Chemistries  Recent Labs  Lab 01/22/18 1838 01/23/18 0448  NA 128* 131*  K 3.6 3.7  CL 92* 98  CO2 27 24  GLUCOSE 123* 104*  BUN 17 16  CREATININE 0.74 0.60  CALCIUM 8.8* 8.7*  AST 45*  --   ALT 35  --   ALKPHOS 39  --   BILITOT 0.4  --    ------------------------------------------------------------------------------------------------------------------  Cardiac Enzymes Recent Labs  Lab 01/23/18 0448  TROPONINI <0.03   ------------------------------------------------------------------------------------------------------------------  RADIOLOGY:  No results found.   ASSESSMENT AND PLAN:   83 year old female with past medical history of hypertension, hyperlipidemia, history of coronary artery disease, anxiety, depression who presents to the hospital due to chest pain/pressure.  1. Chest pain-patient does have significant risk factors given history of coronary disease status post stents in the past. -Her chest pain has now resolved, her cardiac markers x3 have been negative.  Seen by cardiology and  Non invasive risk stratification not likely helpful given known collateralized RCA. 3 years ago she had moderate LAD disease and has had stents to circumflex  - Plan for Cardiac catherization today. - Continue aspirin, metoprolol, Plavix, Imdur, losartan, atorvastatin.  2.  Essential hypertension-continue losartan, metoprolol, Imdur.  3.  Anxiety/depression-continue Xanax, Celexa.  4.  GERD-continue Protonix.  Dispo -  pending cardiac cath today.  All the  records are reviewed and case discussed with Care Management/Social Worker. Management plans discussed with the patient, family and they are in agreement.  CODE STATUS: Partial Code  DVT Prophylaxis: Lovenox  TOTAL TIME TAKING CARE OF THIS PATIENT: 25 minutes.   POSSIBLE D/C 1-2 DAYS, DEPENDING ON CLINICAL CONDITION.   Henreitta Leber M.D on 01/25/2018 at 1:45 PM  Between 7am to 6pm - Pager - [phone]  After 6pm go to [email or web address] - [employer] [facility]  Office  [phone]  CC: Primary care physician; [name], MD

## 2018-01-25 NOTE — Progress Notes (Signed)
Dr. Fletcher Anon at bedside, speaking with pt. And family re: cath results. Both verbalize understanding of conversation.

## 2018-01-25 NOTE — Progress Notes (Signed)
Progress Note  Patient Name: Amy Morton Date of Encounter: 01/25/2018  Primary Cardiologist: Nahser  Subjective   No further chest pain or SOB. For LHC today.   Inpatient Medications    Scheduled Meds: . aspirin EC  81 mg Oral Daily  . atorvastatin  80 mg Oral q1800  . calcium carbonate  500 mg Oral BID  . cholecalciferol  5,000 Units Oral Daily  . citalopram  20 mg Oral Daily  . clopidogrel  75 mg Oral Q breakfast  . enoxaparin (LOVENOX) injection  40 mg Subcutaneous Q24H  . isosorbide mononitrate  30 mg Oral Daily  . losartan  50 mg Oral Daily  . metoprolol tartrate  75 mg Oral BID  . mirtazapine  15 mg Oral QHS  . mometasone-formoterol  2 puff Inhalation BID  . multivitamin with minerals  1 tablet Oral Daily  . nitroGLYCERIN  1 inch Topical Q6H  . omega-3 acid ethyl esters  2 capsule Oral BID  . pantoprazole  40 mg Oral Daily  . sodium chloride flush  3 mL Intravenous Q12H  . vitamin B-12  250 mcg Oral Daily   Continuous Infusions: . sodium chloride    . sodium chloride     PRN Meds: sodium chloride, acetaminophen, acetaminophen, albuterol, ALPRAZolam, gi cocktail, hydrALAZINE, morphine injection, ondansetron (ZOFRAN) IV, sodium chloride flush   Vital Signs    Vitals:   01/24/18 1705 01/24/18 1934 01/24/18 1935 01/25/18 0541  BP: (!) 157/80 (!) 172/65 (!) 170/72 137/66  Pulse: 66 63 62 (!) 59  Resp: 18  17   Temp:  97.6 F (36.4 C)  97.8 F (36.6 C)  TempSrc:  Oral  Oral  SpO2: 95% 98% 99% 90%  Weight:    143 lb 11.2 oz (65.2 kg)  Height:        Intake/Output Summary (Last 24 hours) at 01/25/2018 0914 Last data filed at 01/25/2018 0030 Gross per 24 hour  Intake -  Output 850 ml  Net -850 ml   Filed Weights   01/22/18 1834 01/22/18 2053 01/25/18 0541  Weight: 140 lb (63.5 kg) 146 lb 8 oz (66.5 kg) 143 lb 11.2 oz (65.2 kg)    Telemetry    NSR - Personally Reviewed  ECG    n/a - Personally Reviewed  Physical Exam   GEN: No acute  distress.   Neck: No JVD. Cardiac: RRR, III/VI harsh, holo-systolic murmur RUSB, no rubs, or gallops.  Respiratory: Clear to auscultation bilaterally.  GI: Soft, nontender, non-distended.   MS: No edema; No deformity. Neuro:  Alert and oriented x 3; Nonfocal.  Psych: Normal affect.  Labs    Chemistry Recent Labs  Lab 01/22/18 1838 01/23/18 0448  NA 128* 131*  K 3.6 3.7  CL 92* 98  CO2 27 24  GLUCOSE 123* 104*  BUN 17 16  CREATININE 0.74 0.60  CALCIUM 8.8* 8.7*  PROT 7.2  --   ALBUMIN 4.2  --   AST 45*  --   ALT 35  --   ALKPHOS 39  --   BILITOT 0.4  --   GFRNONAA >60 >60  GFRAA >60 >60  ANIONGAP 9 9     Hematology Recent Labs  Lab 01/22/18 1838  WBC 7.0  RBC 3.56*  HGB 11.5*  HCT 33.3*  MCV 93.6  MCH 32.3  MCHC 34.5  RDW 12.7  PLT 161    Cardiac Enzymes Recent Labs  Lab 01/22/18 1838 01/22/18 2132 01/23/18 0201  01/23/18 0448  TROPONINI <0.03 <0.03 <0.03 <0.03   No results for input(s): TROPIPOC in the last 168 hours.   BNPNo results for input(s): BNP, PROBNP in the last 168 hours.   DDimer No results for input(s): DDIMER in the last 168 hours.   Radiology    No results found.  Cardiac Studies   TTE 01/23/2018: Study Conclusions  - Left ventricle: The cavity size was normal. Wall thickness was   increased in a pattern of mild LVH. Systolic function was normal.   The estimated ejection fraction was in the range of 60% to 65%.   Doppler parameters are consistent with both elevated ventricular   end-diastolic filling pressure and elevated left atrial filling   pressure. - Aortic valve: There was moderate stenosis. There was trivial   regurgitation. Valve area (VTI): 1.07 cm^2. Valve area (Vmax):   1.06 cm^2. Valve area (Vmean): 1.21 cm^2. - Mitral valve: Calcified annulus. There was mild to moderate   regurgitation. Valve area by pressure half-time: 2.39 cm^2. - Left atrium: The atrium was severely dilated. - Atrial septum: No defect or  patent foramen ovale was identified. - Tricuspid valve: There was moderate regurgitation.  Patient Profile     83 y.o. female with history of CAD s/p PTCA in 1196 s/p PCI to LCx in 03/2015, aortic stenosis, HTN, HLD, anxiety, and depression admitted with unstable angina.  Assessment & Plan    1. Unstable angina/CAD: -Ruled out -Currently, chest pain free -For LHC today -ASA, Plavix, Imdur, Lopressor, Lipitor -Risks and benefits of cardiac catheterization have been discussed with the patient including risks of bleeding, bruising, infection, kidney damage, stroke, heart attack, and death. The patient understands these risks and is willing to proceed with the procedure. All questions have been answered and concerns listened to  2. Moderate AS: -Follow clinically -Stable  3. LVOT obstruction: -Follow up outpatient  4. HTN: -BP reasonably controlled on Imdur and Lopressor  5. HLD: -Lipitor 80 mg -Recent LDL of 29 from 08/2017  6. Hyponatremia: -Improving    For questions or updates, please contact [place] Please consult [email or web address] for contact info under Cardiology/STEMI.    Signed, Christell Faith, PA-C [facility] Pager: [phone] 01/25/2018, 9:14 AM

## 2018-01-25 NOTE — Interval H&P Note (Signed)
Cath Lab Visit (complete for each Cath Lab visit)  Clinical Evaluation Leading to the Procedure:   ACS: Yes.  unstable angina   Non-ACS:  n/a   History and Physical Interval Note:  01/25/2018 2:58 PM  Amy Morton  has presented today for surgery, with the diagnosis of unstable angina  The various methods of treatment have been discussed with the patient and family. After consideration of risks, benefits and other options for treatment, the patient has consented to  Procedure(s): LEFT HEART CATH AND CORONARY ANGIOGRAPHY (N/A) as a surgical intervention .  The patient's history has been reviewed, patient examined, no change in status, stable for surgery.  I have reviewed the patient's chart and labs.  Questions were answered to the patient's satisfaction.     Amy Morton

## 2018-01-26 ENCOUNTER — Encounter: Payer: Self-pay | Admitting: Cardiovascular Disease

## 2018-01-26 NOTE — Discharge Summary (Signed)
[facility] NAME: Amy Morton    MR#:  [id number]  DATE OF BIRTH:  June 04, 1935  DATE OF ADMISSION:  01/22/2018 ADMITTING PHYSICIAN: Gorden Harms, MD  DATE OF DISCHARGE: 01/25/2018  7:49 PM  PRIMARY CARE PHYSICIAN: [name], MD    ADMISSION DIAGNOSIS:  Chest pain, unspecified type [R07.9]  DISCHARGE DIAGNOSIS:  Active Problems:   Unstable angina ([place])   Chest pain   SECONDARY DIAGNOSIS:   Past Medical History:  Diagnosis Date  . Abnormal mammogram 2007   Recommend close follow up  . Anemia    Iron, b12, SPEP, UPEP normal, 12/2012  . Anxiety   . Aortic stenosis    mild to moderate  . aortic stenosis   . Arthritis   . Asthma   . CAD (coronary artery disease)    s/p angioplasty 1996  . Chronic cough   . Depression   . Diverticulosis   . Dyspnea   . Elevated LFTs 2010   s/p ultrasound w/ possible fatty liver; GI consult. Resolution 2011  . Gastric ulcer   . GERD (gastroesophageal reflux disease)   . History of colonic polyps    Dr. Vira Agar  . HOH (hard of hearing)   . Hyperlipidemia   . Hypertension   . Peritoneal carcinomatosis [facility]) 2001   Dr. Oliva Bustard & Dr. Claiborne Rigg s/p chemo  . Shingles 2008   T-9 distribution  . Valvular heart disease    Mild to Moderate MR, AS  . Vitamin D deficiency   . Wheezing     HOSPITAL COURSE:   83 year old female with past medical history of hypertension, hyperlipidemia, history of coronary artery disease, anxiety, depression who presents to the hospital due to chest pain/pressure.  1. Chest pain-patient did have significant risk factors given history of coronary disease status post stents in the past and therefore admitted to the hospital.  -Patient's cardiac markers x3 were negative. Seen by cardiology and  Non invasive risk stratification not have been helpful given known collateralized RCA. 3 years ago she had moderate LAD disease and has had stents to  circumflex  - Patient underwent cardiac catheterization which showed (I do not see significant change in coronary anatomy since 2016.  The vessels are heavily calcified and tortuous.  Recommend continuing medical therapy.) - pt. Will Continue aspirin, metoprolol, Plavix, Imdur, losartan, atorvastatin.  2.  Essential hypertension- pt. Will continue losartan, metoprolol, Imdur.  3.  Anxiety/depression- pt. Will continue Xanax, Celexa.  4.  GERD-pt will continue Protonix.  5. Hyperlipidemia - pt. Will cont. Her Atorvastatin.   DISCHARGE CONDITIONS:   Stable  CONSULTS OBTAINED:  Treatment Team:  Nahser, Wonda Cheng, MD  DRUG ALLERGIES:   Allergies  Allergen Reactions  . Abilify [Aripiprazole] Other (See Comments)    Burning in Chest Elevated b/p Insomnia    DISCHARGE MEDICATIONS:   Allergies as of 01/25/2018      Reactions   Abilify [aripiprazole] Other (See Comments)   Burning in Chest Elevated b/p Insomnia      Medication List    TAKE these medications   acetaminophen 650 MG CR tablet Commonly known as:  TYLENOL Take 1,000 mg by mouth 2 (two) times daily.   albuterol 108 (90 Base) MCG/ACT inhaler Commonly known as:  PROVENTIL HFA;VENTOLIN HFA Inhale 2 puffs into the lungs every 4 (four) hours as needed for wheezing or shortness of breath.   aspirin 81 MG tablet Take 81 mg by  mouth daily.   atorvastatin 80 MG tablet Commonly known as:  LIPITOR TAKE 1 TABLET BY MOUTH DAILY AT 6PM   calcium gluconate 650 MG tablet Take 650 mg by mouth 2 (two) times daily.   Cholecalciferol 5000 units Tabs Take 5,000 Units by mouth daily.   citalopram 20 MG tablet Commonly known as:  CELEXA TAKE 1 TABLET BY MOUTH DAILY   clopidogrel 75 MG tablet Commonly known as:  PLAVIX TAKE 1 TABLET BY MOUTH DAILY WITH BREAKFAST   Fluticasone-Salmeterol 250-50 MCG/DOSE Aepb Commonly known as:  ADVAIR DISKUS Inhale 1 puff into the lungs 2 (two) times daily.   isosorbide  mononitrate 30 MG 24 hr tablet Commonly known as:  IMDUR TAKE 1 TABLET(30 MG) BY MOUTH DAILY   metoprolol tartrate 50 MG tablet Commonly known as:  LOPRESSOR TAKE 1 TABLET BY MOUTH TWICE DAILY What changed:  Another medication with the same name was removed. Continue taking this medication, and follow the directions you see here.   mirtazapine 7.5 MG tablet Commonly known as:  REMERON TAKE 1 TABLET(7.5 MG) BY MOUTH AT BEDTIME   multivitamin capsule Take 1 capsule by mouth daily.   nitroGLYCERIN 0.4 MG SL tablet Commonly known as:  NITROSTAT PLACE 1 TABLET UNDER THE TONGUE EVERY 5 MINUTES AS NEEDED FOR CHEST PAIN, 3 DOSES MAX   omega-3 acid ethyl esters 1 g capsule Commonly known as:  LOVAZA TAKE 2 CAPSULES BY MOUTH TWICE DAILY   pantoprazole 40 MG tablet Commonly known as:  PROTONIX TAKE 1 TABLET(40 MG) BY MOUTH DAILY   vitamin B-12 250 MCG tablet Commonly known as:  CYANOCOBALAMIN Take 250 mcg by mouth daily.         DISCHARGE INSTRUCTIONS:   DIET:  Cardiac diet  DISCHARGE CONDITION:  Stable  ACTIVITY:  Activity as tolerated  OXYGEN:  Home Oxygen: No.   Oxygen Delivery: room air  DISCHARGE LOCATION:  home   If you experience worsening of your admission symptoms, develop shortness of breath, life threatening emergency, suicidal or homicidal thoughts you must seek medical attention immediately by calling 911 or calling your MD immediately  if symptoms less severe.  You Must read complete instructions/literature along with all the possible adverse reactions/side effects for all the Medicines you take and that have been prescribed to you. Take any new Medicines after you have completely understood and accpet all the possible adverse reactions/side effects.   Please note  You were cared for by a hospitalist during your hospital stay. If you have any questions about your discharge medications or the care you received while you were in the hospital after you  are discharged, you can call the unit and asked to speak with the hospitalist on call if the hospitalist that took care of you is not available. Once you are discharged, your primary care physician will handle any further medical issues. Please note that NO REFILLS for any discharge medications will be authorized once you are discharged, as it is imperative that you return to your primary care physician (or establish a relationship with a primary care physician if you do not have one) for your aftercare needs so that they can reassess your need for medications and monitor your lab values.    DATA REVIEW:   CBC Recent Labs  Lab 01/22/18 1838  WBC 7.0  HGB 11.5*  HCT 33.3*  PLT 161    Chemistries  Recent Labs  Lab 01/22/18 1838 01/23/18 0448  NA 128* 131*  K 3.6  3.7  CL 92* 98  CO2 27 24  GLUCOSE 123* 104*  BUN 17 16  CREATININE 0.74 0.60  CALCIUM 8.8* 8.7*  AST 45*  --   ALT 35  --   ALKPHOS 39  --   BILITOT 0.4  --     Cardiac Enzymes Recent Labs  Lab 01/23/18 0448  TROPONINI <0.03    Microbiology Results  Results for orders placed or performed during the hospital encounter of 04/27/15  MRSA PCR Screening     Status: None   Collection Time: 04/27/15  2:39 PM  Result Value Ref Range Status   MRSA by PCR NEGATIVE NEGATIVE Final    Comment:        The GeneXpert MRSA Assay (FDA approved for NASAL specimens only), is one component of a comprehensive MRSA colonization surveillance program. It is not intended to diagnose MRSA infection nor to guide or monitor treatment for MRSA infections.     RADIOLOGY:  No results found.    Management plans discussed with the patient, family and they are in agreement.  CODE STATUS:  Code Status History    Date Active Date Inactive Code Status Order ID Comments User Context   01/22/2018 2003 01/25/2018 2254 Partial Code [id number]  SalaryAvel Peace, MD ED  Questions for Most Recent Historical Code Status (Order  [id number])    Question Answer Comment   In the event of cardiac or respiratory ARREST: Initiate Code Blue, Call Rapid Response Yes    In the event of cardiac or respiratory ARREST: Perform CPR Yes    In the event of cardiac or respiratory ARREST: Perform Intubation/Mechanical Ventilation No    In the event of cardiac or respiratory ARREST: Use NIPPV/BiPAp only if indicated Yes    In the event of cardiac or respiratory ARREST: Administer ACLS medications if indicated Yes    In the event of cardiac or respiratory ARREST: Perform Defibrillation or Cardioversion if indicated Yes           TOTAL TIME TAKING CARE OF THIS PATIENT: 40 minutes.    Henreitta Leber M.D on 01/26/2018 at 5:02 PM  Between 7am to 6pm - Pager - [phone]  After 6pm go to [email or web address] - [employer] [facility]  Office  [phone]  CC: Primary care physician; [name], MD

## 2018-02-02 ENCOUNTER — Ambulatory Visit (INDEPENDENT_AMBULATORY_CARE_PROVIDER_SITE_OTHER): Payer: Medicare Other | Admitting: Physician Assistant

## 2018-02-02 ENCOUNTER — Encounter: Payer: Self-pay | Admitting: Physician Assistant

## 2018-02-02 VITALS — BP 110/80 | HR 62 | Ht 59.0 in | Wt 144.8 lb

## 2018-02-02 DIAGNOSIS — I10 Essential (primary) hypertension: Secondary | ICD-10-CM | POA: Diagnosis not present

## 2018-02-02 DIAGNOSIS — E785 Hyperlipidemia, unspecified: Secondary | ICD-10-CM

## 2018-02-02 DIAGNOSIS — I350 Nonrheumatic aortic (valve) stenosis: Secondary | ICD-10-CM | POA: Diagnosis not present

## 2018-02-02 DIAGNOSIS — I2510 Atherosclerotic heart disease of native coronary artery without angina pectoris: Secondary | ICD-10-CM | POA: Diagnosis not present

## 2018-02-02 DIAGNOSIS — I2583 Coronary atherosclerosis due to lipid rich plaque: Secondary | ICD-10-CM | POA: Diagnosis not present

## 2018-02-02 NOTE — Patient Instructions (Signed)
Medication Instructions:  Your physician recommends that you continue on your current medications as directed. Please refer to the Current Medication list given to you today.   Labwork: none  Testing/Procedures: none  Follow-Up: You have been referred to [facility]. If you have not heard from them in about 1 week, please give them a call. Number listed below.    Your physician recommends that you schedule a follow-up appointment in: [place].   If you need a refill on your cardiac medications before your next appointment, please call your pharmacy.

## 2018-02-02 NOTE — Progress Notes (Signed)
Cardiology Office Note Date:  02/02/2018  Patient ID:  Amy Morton, Amy Morton 1934/09/23, MRN [id number] PCP:  [name], MD  Cardiologist:  Dr. Acie Fredrickson, MD (patient no longer drives and is unable to travel to [facility] to be seen by Dr. Acie Fredrickson, wants to establish with Dr. Fletcher Anon)    Chief Complaint: Hospital follow-up  History of Present Illness: Amy Morton is a 83 y.o. female with history of CAD status post PTCA in 1996 status post PCI to the LCx in 03/2015, mild to moderate aortic stenosis, anemia, HTN, HLD, and anxiety/depression who presents for hospital follow-up after recent admission to [facility] from 6/28 two 7/1 for unstable angina.  Cardiac catheterization on 04/27/2015 demonstrated mid LAD lesion-1 40% stenosis, mid LAD lesion-2 70% stenosis, ostial to proximal LCx 40% stenosis, mid LCx 70% stenosis with an FFR of 0.79 status post PCI, distal RCA 100% stenosis which was known from 2012 cardiac catheterization.  She was noted to have complex multivessel disease with moderate to severe disease in the mid LCx that was significant by FFR as detailed above.  This was treated successfully with overlapping drug-eluting stents.  Due to the vessels tortuosity it was difficult to deliver a long stents.  Her LAD lesion was left to be medically managed given the tortuosity of the LAD segment.  Echo from 01/2017 demonstrated an EF of 60 to 65%, mild concentric LVH, normal wall motion, grade 2 diastolic dysfunction, severely calcified aortic annulus with moderate aortic stenosis with a mean gradient of 23 mmHg, a peak gradient of 40 mmHg, valve area 1.21 cm, mild mitral regurgitation, PASP 31 mmHg.  Patient was admitted to [facility] on 6/28 with substernal chest pain that was similar to her previous angina that improved with sublingual nitroglycerin x3.  EKG was nonacute.  Cardiac enzymes were negative.  Chest x-ray nonacute.  It was felt noninvasive risk stratification would be not helpful giving  her known collateralized RCA and with known moderate LAD disease along with stenting to the LCx as detailed above.  She underwent cardiac catheterization on 01/25/2018 that showed significant underlying three-vessel CAD with patent stents in the LCx without significant restenosis, stable discrete 70% stenosis and a very tortuous mid LAD segment right after the origin of D2 and known chronically occluded distal RCA with collaterals.  Moderate aortic stenosis was noted with a peak to peak gradient of 18 to 20 mmHg.  She was noted to have mildly elevated LVEDP.  LV gram was not performed as the EF was noted to be normal by echo as detailed below.  There was not any significant change in her coronary anatomy since 2016.  Continued medical therapy was recommended.  Echocardiogram on 01/23/2018 showed an EF of 60 to 30%, diastolic dysfunction, mild LVH, moderate aortic stenosis with trivial aortic insufficiency, calcified mitral annulus with mild to moderate mitral regurgitation, severely dilated left atrium, moderate tricuspid regurgitation.  She comes in today accompanied by her granddaughter and a longtime family friend.  She is doing well and has not had any further symptoms concerning for angina since her hospital admission as above.  Both the patient, family friend, and granddaughter have noted a gradual decline in the patient's functional capacity over the past 6 to 12 months.  They have also noticed a decline in the patient's mental capacity.  She is tolerating all medications without issues.  Blood pressure remains well controlled.  She has not had any dizziness, presyncope, or syncope.  Past Medical History:  Diagnosis Date  . Abnormal mammogram 2007   Recommend close follow up  . Anemia    Iron, b12, SPEP, UPEP normal, 12/2012  . Anxiety   . Aortic stenosis    mild to moderate  . aortic stenosis   . Arthritis   . Asthma   . CAD (coronary artery disease)    s/p angioplasty 1996  . Chronic cough     . Depression   . Diverticulosis   . Dyspnea   . Elevated LFTs 2010   s/p ultrasound w/ possible fatty liver; GI consult. Resolution 2011  . Gastric ulcer   . GERD (gastroesophageal reflux disease)   . History of colonic polyps    Dr. Vira Agar  . HOH (hard of hearing)   . Hyperlipidemia   . Hypertension   . Peritoneal carcinomatosis [facility]) 2001   Dr. Oliva Bustard & Dr. Claiborne Rigg s/p chemo  . Shingles 2008   T-9 distribution  . Valvular heart disease    Mild to Moderate MR, AS  . Vitamin D deficiency   . Wheezing     Past Surgical History:  Procedure Laterality Date  . ABDOMINAL HYSTERECTOMY  2001  . APPENDECTOMY    . CARDIAC CATHETERIZATION  2007   50% mid LAD stenosis, 70% proximal RCA with 100% distal RCA stenosis with collaterals, EF 65%  . CARDIAC CATHETERIZATION N/A 04/27/2015   Procedure: Left Heart Cath and Coronary Angiography;  Surgeon: Jettie Booze, MD;  Location: [facility];  Service: Cardiovascular;  Laterality: N/A;  . CARDIAC CATHETERIZATION N/A 04/27/2015   Procedure: Coronary Stent Intervention;  Surgeon: Jettie Booze, MD;  Location: [place];  Service: Cardiovascular;  Laterality: N/A;  . CATARACT EXTRACTION W/PHACO Left 09/25/2016   Procedure: CATARACT EXTRACTION PHACO AND INTRAOCULAR LENS PLACEMENT (IOC);  Surgeon: Eulogio Bear, MD;  Location: [facility] ORS;  Service: Ophthalmology;  Laterality: Left;  [place] 46.7 AP% 8.1 CDE 3.80 Fluid pack lot # [id number]  . CATARACT EXTRACTION W/PHACO Right 11/06/2016   Procedure: CATARACT EXTRACTION PHACO AND INTRAOCULAR LENS PLACEMENT ([place]);  Surgeon: Eulogio Bear, MD;  Location: [facility] ORS;  Service: Ophthalmology;  Laterality: Right;  [place] 49.5 AP8.7 CDE4.28 LOT # [id number]  . CHOLECYSTECTOMY  1987  . COLECTOMY  2001  . CORONARY ANGIOPLASTY  1996  . CORONARY ANGIOPLASTY    . LAPAROSCOPIC BILATERAL SALPINGO OOPHERECTOMY  1988   Dr. Laverta Baltimore  . LEFT HEART CATH AND CORONARY ANGIOGRAPHY N/A  01/25/2018   Procedure: LEFT HEART CATH AND CORONARY ANGIOGRAPHY;  Surgeon: Wellington [place], MD;  Location: [facility];  Service: Cardiovascular;  Laterality: N/A;  . TONSILLECTOMY  1950    Current Meds  Medication Sig  . acetaminophen (TYLENOL) 650 MG CR tablet Take 1,000 mg by mouth 2 (two) times daily.   Marland Kitchen albuterol (PROVENTIL HFA;VENTOLIN HFA) 108 (90 BASE) MCG/ACT inhaler Inhale 2 puffs into the lungs every 4 (four) hours as needed for wheezing or shortness of breath.   Marland Kitchen aspirin 81 MG tablet Take 81 mg by mouth daily.  Marland Kitchen atorvastatin (LIPITOR) 80 MG tablet TAKE 1 TABLET BY MOUTH DAILY AT 6PM  . Calcium 500 MG tablet Take by mouth.  . calcium gluconate 650 MG tablet Take 650 mg by mouth 2 (two) times daily.  . Cholecalciferol 5000 units TABS Take 5,000 Units by mouth daily.  . citalopram (CELEXA) 20 MG tablet TAKE 1 TABLET BY MOUTH DAILY  . clopidogrel (PLAVIX) 75 MG tablet TAKE 1  TABLET BY MOUTH DAILY WITH BREAKFAST  . Fluticasone-Salmeterol (ADVAIR DISKUS) 250-50 MCG/DOSE AEPB Inhale 1 puff into the lungs 2 (two) times daily.  . isosorbide mononitrate (IMDUR) 30 MG 24 hr tablet TAKE 1 TABLET(30 MG) BY MOUTH DAILY  . metoprolol (LOPRESSOR) 50 MG tablet TAKE 1 TABLET BY MOUTH TWICE DAILY  . mirtazapine (REMERON) 7.5 MG tablet TAKE 1 TABLET(7.5 MG) BY MOUTH AT BEDTIME  . Multiple Vitamin (MULTIVITAMIN) capsule Take 1 capsule by mouth daily.  . nitroGLYCERIN (NITROSTAT) 0.4 MG SL tablet PLACE 1 TABLET UNDER THE TONGUE EVERY 5 MINUTES AS NEEDED FOR CHEST PAIN, 3 DOSES MAX  . omega-3 acid ethyl esters (LOVAZA) 1 g capsule TAKE 2 CAPSULES BY MOUTH TWICE DAILY  . pantoprazole (PROTONIX) 40 MG tablet TAKE 1 TABLET(40 MG) BY MOUTH DAILY  . vitamin B-12 (CYANOCOBALAMIN) 250 MCG tablet Take 250 mcg by mouth daily.    Allergies:   Abilify [aripiprazole]   Social History:  The patient  reports that she has never smoked. She has never used smokeless tobacco. She reports that she does  not drink alcohol or use drugs.   Family History:  The patient's family history includes Cancer in her father; Cancer (age of onset: 84) in her daughter; Diabetes in her brother and sister; Heart disease in her brother; Hypertension in her mother; Stroke (age of onset: 66) in her mother.  ROS:   Review of Systems  Constitutional: Positive for malaise/fatigue. Negative for chills, diaphoresis, fever and weight loss.  HENT: Negative for congestion.   Eyes: Negative for discharge and redness.  Respiratory: Negative for cough, hemoptysis, sputum production, shortness of breath and wheezing.   Cardiovascular: Negative for chest pain, palpitations, orthopnea, claudication, leg swelling and PND.  Gastrointestinal: Negative for abdominal pain, blood in stool, heartburn, melena, nausea and vomiting.  Genitourinary: Negative for hematuria.  Musculoskeletal: Negative for falls and myalgias.  Skin: Negative for rash.  Neurological: Negative for dizziness, tingling, tremors, sensory change, speech change, focal weakness, loss of consciousness and weakness.  Endo/Heme/Allergies: Does not bruise/bleed easily.  Psychiatric/Behavioral: Positive for memory loss. Negative for substance abuse. The patient is not nervous/anxious.   All other systems reviewed and are negative.    PHYSICAL EXAM:  VS:  BP 110/80 (BP Location: Right Arm, Patient Position: Sitting, Cuff Size: Normal)   Pulse 62   Ht 4\' 11"  (1.499 m)   Wt 144 lb 12 oz (65.7 kg)   BMI 29.24 kg/m  BMI: Body mass index is 29.24 kg/m.  Physical Exam  Constitutional: She is oriented to person, place, and time. She appears well-developed and well-nourished.  HENT:  Head: Normocephalic and atraumatic.  Eyes: Right eye exhibits no discharge. Left eye exhibits no discharge.  Neck: Normal range of motion. No JVD present.  Cardiovascular: Normal rate, regular rhythm, S1 normal and S2 normal. Exam reveals no distant heart sounds, no friction rub, no  midsystolic click and no opening snap.  Murmur heard.  Harsh midsystolic murmur is present with a grade of 2/6 at the upper right sternal border radiating to the neck. Pulses:      Dorsalis pedis pulses are 2+ on the right side, and 2+ on the left side.       Posterior tibial pulses are 2+ on the right side, and 2+ on the left side.  Pulmonary/Chest: Effort normal and breath sounds normal. No respiratory distress. She has no decreased breath sounds. She has no wheezes. She has no rales. She exhibits no tenderness.  Abdominal:  Soft. She exhibits no distension. There is no tenderness.  Musculoskeletal: She exhibits no edema.  Neurological: She is alert and oriented to person, place, and time.  Skin: Skin is warm and dry. No cyanosis. Nails show no clubbing.  Psychiatric: She has a normal mood and affect. Her speech is normal and behavior is normal. Judgment and thought content normal.     EKG:  Was ordered and interpreted by me today. Shows NSR, 62 bpm, no acute ST-T changes  Recent Labs: 01/22/2018: ALT 35; Hemoglobin 11.5; Platelets 161 01/23/2018: BUN 16; Creatinine, Ser 0.60; Potassium 3.7; Sodium 131  09/03/2017: Chol/HDL Ratio 2.3; Cholesterol, Total 115; HDL 50; LDL Calculated 29; Triglycerides 179   Estimated Creatinine Clearance: 46.2 mL/min (by C-G formula based on SCr of 0.6 mg/dL).   Wt Readings from Last 3 Encounters:  02/02/18 144 lb 12 oz (65.7 kg)  01/25/18 143 lb 11.2 oz (65.2 kg)  09/03/17 143 lb 12.8 oz (65.2 kg)     Other studies reviewed: Additional studies/records reviewed today include: summarized above  ASSESSMENT AND PLAN:  1. CAD of the native coronary arteries without angina: Currently without any symptoms concerning for angina.  Recent cardiac catheterization from 01/25/2018 demonstrated stable coronary anatomy with compared to prior cardiac catheterization.  Continue dual antiplatelet therapy with aspirin 81 mg daily and Plavix 75 mg daily along with isosorbide  mononitrate 30 mg daily, and metoprolol tartrate 50 mg twice daily.  Should she redevelop symptoms concerning for angina would escalate medical therapy.  Refer to cardiac rehab.  No plans for further ischemic evaluation at this time.  2. Moderate aortic stenosis: Asymptomatic.  Continue to monitor clinically with periodic echocardiogram.  3. Essential hypertension: Blood pressure well controlled today.  Continue isosorbide mononitrate 30 mg daily and metoprolol tartrate 50 mg twice daily.  4. Hyperlipidemia: Goal LDL less than 70.  LDL from 08/2017 of 29.  Continue atorvastatin 80 mg daily.  Disposition: F/u with Dr. Fletcher Anon in 3 months.  Current medicines are reviewed at length with the patient today.  The patient did not have any concerns regarding medicines.  Signed, Christell Faith, PA-C 02/02/2018 3:21 PM     [address] [place], [place] [address] [phone]

## 2018-02-08 ENCOUNTER — Encounter: Payer: Self-pay | Admitting: Family Medicine

## 2018-02-08 ENCOUNTER — Ambulatory Visit (INDEPENDENT_AMBULATORY_CARE_PROVIDER_SITE_OTHER): Payer: Medicare Other | Admitting: Family Medicine

## 2018-02-08 VITALS — BP 112/80 | HR 67 | Temp 98.0°F | Wt 146.2 lb

## 2018-02-08 DIAGNOSIS — R600 Localized edema: Secondary | ICD-10-CM | POA: Diagnosis not present

## 2018-02-08 DIAGNOSIS — R413 Other amnesia: Secondary | ICD-10-CM | POA: Diagnosis not present

## 2018-02-08 DIAGNOSIS — I200 Unstable angina: Secondary | ICD-10-CM

## 2018-02-08 DIAGNOSIS — F419 Anxiety disorder, unspecified: Secondary | ICD-10-CM | POA: Diagnosis not present

## 2018-02-08 DIAGNOSIS — E785 Hyperlipidemia, unspecified: Secondary | ICD-10-CM | POA: Diagnosis not present

## 2018-02-08 DIAGNOSIS — F0390 Unspecified dementia without behavioral disturbance: Secondary | ICD-10-CM | POA: Insufficient documentation

## 2018-02-08 LAB — COMPREHENSIVE METABOLIC PANEL
ALT: 34 U/L (ref 0–35)
AST: 39 U/L — ABNORMAL HIGH (ref 0–37)
Albumin: 4.3 g/dL (ref 3.5–5.2)
Alkaline Phosphatase: 41 U/L (ref 39–117)
BUN: 13 mg/dL (ref 6–23)
CO2: 27 mEq/L (ref 19–32)
Calcium: 9.5 mg/dL (ref 8.4–10.5)
Chloride: 95 mEq/L — ABNORMAL LOW (ref 96–112)
Creatinine, Ser: 0.75 mg/dL (ref 0.40–1.20)
GFR: 78.86 mL/min (ref >60.00)
Glucose, Bld: 106 mg/dL — ABNORMAL HIGH (ref 70–99)
Potassium: 4.3 mEq/L (ref 3.5–5.1)
Sodium: 131 mEq/L — ABNORMAL LOW (ref 135–145)
Total Bilirubin: 0.4 mg/dL (ref 0.2–1.2)
Total Protein: 7.1 g/dL (ref 6.0–8.3)

## 2018-02-08 LAB — CBC
HCT: 34.1 % — ABNORMAL LOW (ref 36.0–46.0)
Hemoglobin: 11.9 g/dL — ABNORMAL LOW (ref 12.0–15.0)
MCHC: 34.8 g/dL (ref 30.0–36.0)
MCV: 91.8 fl (ref 78.0–100.0)
Platelets: 175 10*3/uL (ref 150.0–400.0)
RBC: 3.71 Mil/uL — ABNORMAL LOW (ref 3.87–5.11)
RDW: 12.6 % (ref 11.5–15.5)
WBC: 7.7 10*3/uL (ref 4.0–10.5)

## 2018-02-08 LAB — TSH: TSH: 1.93 u[IU]/mL (ref 0.35–4.50)

## 2018-02-08 NOTE — Assessment & Plan Note (Signed)
Mild short-term memory deficits.  She did pass the mini cog test.  She will monitor at this time.  Potentially could be some residual from her grief.  We will check a TSH to ensure that is not contributing.

## 2018-02-08 NOTE — Progress Notes (Signed)
[name], MD Phone: [phone]  MANASI DISHON is a 83 y.o. female who presents today for f/u.  CC: hld, cad, unstable angina, anxiety/grief  Unstable angina: Patient was admitted to the hospital for chest pain.  She underwent evaluation with cardiac catheterization which did not show significant change in coronary anatomy since 2016.  The vessels were heavily calcified and tortuous.  Recommendation was to continue medical therapy.  She has not had recurrent chest pain.  She has followed up with cardiology.  She has been on aspirin 81 mg as well as Plavix 75 daily.  Also on isosorbide mononitrate and metoprolol.  They have referred her to cardiac rehab.  HYPERLIPIDEMIA Symptoms Chest pain on exertion: See above   leg claudication:   No Medications: Compliance-taking Lipitor, omega-3 right upper quadrant pain-no muscle aches-no  Ankle and pedal edema: She has had this on 2 individual occasions since leaving the hospital.  It would go down overnight and not recur the next day.  She notes no orthopnea or PND.  Anxiety/grief: She denies anxiety.  She denies depression.  She denies grief.  She has been on Celexa and Remeron.  No SI.  She does note some memory difficulty with short-term issues and remembering daily stuff.  No long-term memory issues.  Mini cog testing with 4/5 with accurate clock and recall of 2 out of 3 words.   Social History   Tobacco Use  Smoking Status Never Smoker  Smokeless Tobacco Never Used     ROS see history of present illness  Objective  Physical Exam Vitals:   02/08/18 1311  BP: 112/80  Pulse: 67  Temp: 98 F (36.7 C)  SpO2: 95%    BP Readings from Last 3 Encounters:  02/08/18 112/80  02/02/18 110/80  01/25/18 (!) 154/97   Wt Readings from Last 3 Encounters:  02/08/18 146 lb 3.2 oz (66.3 kg)  02/02/18 144 lb 12 oz (65.7 kg)  01/25/18 143 lb 11.2 oz (65.2 kg)    Physical Exam  Constitutional: No distress.  Cardiovascular:  Normal rate, regular rhythm and normal heart sounds.  Pulmonary/Chest: Effort normal and breath sounds normal.  Musculoskeletal: She exhibits no edema.  Neurological: She is alert.  Skin: Skin is warm and dry. She is not diaphoretic.  Psychiatric: She has a normal mood and affect.     Assessment/Plan: Please see individual problem list.  Unstable angina ([place]) Recently diagnosed with unstable angina.  She has not had any recurrence after evaluation in the hospital.  She will continue with medical therapy and continue to follow with cardiology.  Return precautions given.  Dyslipidemia, goal LDL below 70 At goal on recent check.  She will continue current medications.  Pedal edema Occurred on 2 occasions.  None today.  No CHF symptoms.  We will check lab work as outlined below to evaluate for cause.  Possibly venous insufficiency.  Anxiety Denies any anxiety symptoms.  No depression or grief symptoms.  She will continue Remeron and Celexa.  Memory difficulty Mild short-term memory deficits.  She did pass the mini cog test.  She will monitor at this time.  Potentially could be some residual from her grief.  We will check a TSH to ensure that is not contributing.   Orders Placed This Encounter  Procedures  . Comp Met (CMET)  . CBC  . TSH    No orders of the defined types were placed in this encounter.    [name], MD [facility] -  [employer]

## 2018-02-08 NOTE — Patient Instructions (Signed)
Nice to see you. Please continue your current medications.  We will check lab work today and contact you with the results.  Please monitor your memory and if it worsens please let us know. Please contact your pharmacy and advised them you would like your refills for 90 days. If you develop recurrent swelling.  Please let us know. If you have recurrent chest pain or you develop shortness of breath please be evaluated immediately.

## 2018-02-08 NOTE — Assessment & Plan Note (Signed)
Occurred on 2 occasions.  None today.  No CHF symptoms.  We will check lab work as outlined below to evaluate for cause.  Possibly venous insufficiency.

## 2018-02-08 NOTE — Assessment & Plan Note (Signed)
Denies any anxiety symptoms.  No depression or grief symptoms.  She will continue Remeron and Celexa.

## 2018-02-08 NOTE — Assessment & Plan Note (Signed)
At goal on recent check.  She will continue current medications.

## 2018-02-08 NOTE — Assessment & Plan Note (Signed)
Recently diagnosed with unstable angina.  She has not had any recurrence after evaluation in the hospital.  She will continue with medical therapy and continue to follow with cardiology.  Return precautions given.

## 2018-02-10 ENCOUNTER — Other Ambulatory Visit: Payer: Self-pay | Admitting: Cardiovascular Disease

## 2018-02-11 ENCOUNTER — Other Ambulatory Visit: Payer: Self-pay | Admitting: Cardiovascular Disease

## 2018-02-12 ENCOUNTER — Other Ambulatory Visit: Payer: Self-pay | Admitting: Family Medicine

## 2018-02-12 ENCOUNTER — Telehealth: Payer: Self-pay

## 2018-02-12 DIAGNOSIS — E871 Hypo-osmolality and hyponatremia: Secondary | ICD-10-CM

## 2018-02-12 NOTE — Telephone Encounter (Signed)
Lab ordered.

## 2018-02-19 ENCOUNTER — Other Ambulatory Visit (INDEPENDENT_AMBULATORY_CARE_PROVIDER_SITE_OTHER): Payer: Medicare Other

## 2018-02-19 DIAGNOSIS — E871 Hypo-osmolality and hyponatremia: Secondary | ICD-10-CM | POA: Diagnosis not present

## 2018-02-19 LAB — COMPREHENSIVE METABOLIC PANEL
ALT: 28 U/L (ref 0–35)
AST: 36 U/L (ref 0–37)
Albumin: 4.5 g/dL (ref 3.5–5.2)
Alkaline Phosphatase: 39 U/L (ref 39–117)
BUN: 16 mg/dL (ref 6–23)
CO2: 24 mEq/L (ref 19–32)
Calcium: 9.7 mg/dL (ref 8.4–10.5)
Chloride: 102 mEq/L (ref 96–112)
Creatinine, Ser: 0.74 mg/dL (ref 0.40–1.20)
GFR: 80.09 mL/min (ref >60.00)
Glucose, Bld: 99 mg/dL (ref 70–99)
Potassium: 4.5 mEq/L (ref 3.5–5.1)
Sodium: 137 mEq/L (ref 135–145)
Total Bilirubin: 0.5 mg/dL (ref 0.2–1.2)
Total Protein: 7.7 g/dL (ref 6.0–8.3)

## 2018-03-05 ENCOUNTER — Ambulatory Visit (INDEPENDENT_AMBULATORY_CARE_PROVIDER_SITE_OTHER): Payer: Medicare Other

## 2018-03-05 VITALS — BP 110/74 | HR 79 | Temp 98.2°F | Resp 16 | Ht 59.0 in | Wt 144.8 lb

## 2018-03-05 DIAGNOSIS — Z0000 Encounter for general adult medical examination without abnormal findings: Secondary | ICD-10-CM | POA: Diagnosis not present

## 2018-03-05 NOTE — Progress Notes (Signed)
Subjective:   Amy Morton is a 83 y.o. female who presents for Medicare Annual (Subsequent) preventive examination.  Review of Systems:  No ROS.  Medicare Wellness Visit. Additional risk factors are reflected in the social history. Cardiac Risk Factors include: advanced age (>74men, >22 women)     Objective:     Vitals: BP 110/74 (BP Location: Left Arm, Patient Position: Sitting, Cuff Size: Normal)   Pulse 79   Temp 98.2 F (36.8 C) (Oral)   Resp 16   Ht 4\' 11"  (1.499 m)   Wt 144 lb 12.8 oz (65.7 kg)   SpO2 97%   BMI 29.25 kg/m   Body mass index is 29.25 kg/m.  Advanced Directives 03/05/2018 01/22/2018 01/22/2018 03/04/2017 11/06/2016 03/04/2016 09/19/2015  Does Patient Have a Medical Advance Directive? Yes Yes No Yes Yes Yes Yes  Type of Paramedic of [place];Living will Living will - [place];Living will [place];Living will [place];Living will Living will  Does patient want to make changes to medical advance directive? No - Patient declined No - Patient declined - No - Patient declined - - -  Copy of [employer] in Chart? Yes - - No - copy requested No - copy requested No - copy requested -    Tobacco Social History   Tobacco Use  Smoking Status Never Smoker  Smokeless Tobacco Never Used     Counseling given: Not Answered   Clinical Intake:  Pre-visit preparation completed: Yes  Pain : No/denies pain     Nutritional Status: BMI 25 -29 Overweight Diabetes: No  How often do you need to have someone help you when you read instructions, pamphlets, or other written materials from your doctor or pharmacy?: 1 - Never  Interpreter Needed?: No     Past Medical History:  Diagnosis Date  . Abnormal mammogram 2007   Recommend close follow up  . Anemia    Iron, b12, SPEP, UPEP normal, 12/2012  . Anxiety   . Aortic stenosis    mild to moderate  . aortic  stenosis   . Arthritis   . Asthma   . CAD (coronary artery disease)    s/p angioplasty 1996  . Chronic cough   . Depression   . Diverticulosis   . Dyspnea   . Elevated LFTs 2010   s/p ultrasound w/ possible fatty liver; GI consult. Resolution 2011  . Gastric ulcer   . GERD (gastroesophageal reflux disease)   . History of colonic polyps    Dr. Vira Morton  . HOH (hard of hearing)   . Hyperlipidemia   . Hypertension   . Peritoneal carcinomatosis [facility]) 2001   Dr. Oliva Morton & Dr. Claiborne Morton s/p chemo  . Shingles 2008   T-9 distribution  . Valvular heart disease    Mild to Moderate MR, AS  . Vitamin D deficiency   . Wheezing    Past Surgical History:  Procedure Laterality Date  . ABDOMINAL HYSTERECTOMY  2001  . APPENDECTOMY    . CARDIAC CATHETERIZATION  2007   50% mid LAD stenosis, 70% proximal RCA with 100% distal RCA stenosis with collaterals, EF 65%  . CARDIAC CATHETERIZATION N/A 04/27/2015   Procedure: Left Heart Cath and Coronary Angiography;  Surgeon: Amy Booze, MD;  Location: [place] [facility];  Service: Cardiovascular;  Laterality: N/A;  . CARDIAC CATHETERIZATION N/A 04/27/2015   Procedure: Coronary Stent Intervention;  Surgeon: Amy Booze, MD;  Location: [place] [facility];  Service: Cardiovascular;  Laterality: N/A;  . CATARACT EXTRACTION W/PHACO Left 09/25/2016   Procedure: CATARACT EXTRACTION PHACO AND INTRAOCULAR LENS PLACEMENT (IOC);  Surgeon: Amy Bear, MD;  Location: [facility];  Service: Ophthalmology;  Laterality: Left;  [place] 46.7 AP% 8.1 CDE 3.80 Fluid pack lot # [id number]  . CATARACT EXTRACTION W/PHACO Right 11/06/2016   Procedure: CATARACT EXTRACTION PHACO AND INTRAOCULAR LENS PLACEMENT ([place]);  Surgeon: Amy Bear, MD;  Location: [facility];  Service: Ophthalmology;  Laterality: Right;  [place] 49.5 AP8.7 CDE4.28 LOT # [id number]  . CHOLECYSTECTOMY  1987  . COLECTOMY  2001  . CORONARY ANGIOPLASTY  1996  . CORONARY ANGIOPLASTY    .  LAPAROSCOPIC BILATERAL SALPINGO OOPHERECTOMY  1988   Dr. Laverta Morton  . LEFT HEART CATH AND CORONARY ANGIOGRAPHY N/A 01/25/2018   Procedure: LEFT HEART CATH AND CORONARY ANGIOGRAPHY;  Surgeon: Amy Hampshire, MD;  Location: [place] [facility];  Service: Cardiovascular;  Laterality: N/A;  . TONSILLECTOMY  1950   Family History  Problem Relation Age of Onset  . Hypertension Mother   . Stroke Mother 18       Cerebral Hemorrhage  . Cancer Father        bone cancer  . Diabetes Sister   . Diabetes Brother   . Heart disease Brother   . Cancer Daughter 45       Ovarian cancer  . Colon cancer Neg Hx   . Colon polyps Neg Hx    Social History   Socioeconomic History  . Marital status: Widowed    Spouse name: Not on file  . Number of children: 3  . Years of education: Not on file  . Highest education level: Not on file  Occupational History  . Occupation: [employer]    Comment: Retired  . 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 Financial resource strain: Not hard at all  . Food insecurity:    Worry: Never true    Inability: Never true  . Transportation needs:    Medical: No    Non-medical: No  Tobacco Use  . Smoking status: Never Smoker  . Smokeless tobacco: Never Used  Substance and Sexual Activity  . Alcohol use: No    Alcohol/week: 0.0 standard drinks  . Drug use: No  . Sexual activity: Never  Lifestyle  . Physical activity:    Days per week: Not on file    Minutes per session: Not on file  . Stress: Not on file  Relationships  . Social connections:    Talks on phone: Not on file    Gets together: Not on file    Attends religious service: Not on file    Active member of club or organization: Not on file    Attends meetings of clubs or organizations: Not on file    Relationship status: Not on file  Other Topics Concern  . Not on file  Social History Narrative   Pt lives in [place] by herself. She is widowed and has a  daughter Amy Morton), son Amy Morton). She also had a son that was killed in a work related accident Amy Morton - died age).       Caffeine - none   Exercise - walking    Outpatient Encounter Medications as of 03/05/2018  Medication Sig  . acetaminophen (TYLENOL) 650 MG CR tablet Take 1,000 mg by mouth 2 (two) times daily.   Marland Kitchen  albuterol (PROVENTIL HFA;VENTOLIN HFA) 108 (90 BASE) MCG/ACT inhaler Inhale 2 puffs into the lungs every 4 (four) hours as needed for wheezing or shortness of breath.   Marland Kitchen aspirin 81 MG tablet Take 81 mg by mouth daily.  Marland Kitchen atorvastatin (LIPITOR) 80 MG tablet TAKE 1 TABLET BY MOUTH DAILY AT 6PM  . calcium gluconate 650 MG tablet Take 650 mg by mouth 2 (two) times daily.  . Cholecalciferol 5000 units TABS Take 5,000 Units by mouth daily.  . citalopram (CELEXA) 20 MG tablet TAKE 1 TABLET BY MOUTH DAILY  . clopidogrel (PLAVIX) 75 MG tablet TAKE 1 TABLET BY MOUTH DAILY WITH BREAKFAST  . Fluticasone-Salmeterol (ADVAIR DISKUS) 250-50 MCG/DOSE AEPB Inhale 1 puff into the lungs 2 (two) times daily.  . isosorbide mononitrate (IMDUR) 30 MG 24 hr tablet TAKE 1 TABLET(30 MG) BY MOUTH DAILY  . metoprolol (LOPRESSOR) 50 MG tablet TAKE 1 TABLET BY MOUTH TWICE DAILY  . mirtazapine (REMERON) 7.5 MG tablet TAKE 1 TABLET(7.5 MG) BY MOUTH AT BEDTIME  . Multiple Vitamin (MULTIVITAMIN) capsule Take 1 capsule by mouth daily.  . nitroGLYCERIN (NITROSTAT) 0.4 MG SL tablet PLACE 1 TABLET UNDER THE TONGUE EVERY 5 MINUTES AS NEEDED FOR CHEST PAIN, 3 DOSES MAX  . omega-3 acid ethyl esters (LOVAZA) 1 g capsule TAKE 2 CAPSULES BY MOUTH TWICE DAILY  . pantoprazole (PROTONIX) 40 MG tablet Take 1 tablet (40 mg total) by mouth daily.  . vitamin B-12 (CYANOCOBALAMIN) 250 MCG tablet Take 250 mcg by mouth daily.   Facility-Administered Encounter Medications as of 03/05/2018  Medication  . sodium chloride 0.9 % nebulizer solution 3 mL   Followed by  . methacholine (PROVOCHOLINE) inhaler solution 0.125 mg   Followed by    . methacholine (PROVOCHOLINE) inhaler solution 0.5 mg   Followed by  . methacholine (PROVOCHOLINE) inhaler solution 2 mg   Followed by  . methacholine (PROVOCHOLINE) inhaler solution 8 mg   Followed by  . methacholine (PROVOCHOLINE) inhaler solution 32 mg    Activities of Daily Living In your present state of health, do you have any difficulty performing the following activities: 03/05/2018 01/22/2018  Hearing? N N  Vision? N N  Difficulty concentrating or making decisions? Y N  Walking or climbing stairs? Y N  Dressing or bathing? N N  Doing errands, shopping? N N  Preparing Food and eating ? N -  Using the Toilet? N -  In the past six months, have you accidently leaked urine? N -  Do you have problems with loss of bowel control? N -  Managing your Medications? N -  Managing your Finances? N -  Housekeeping or managing your Housekeeping? N -  Some recent data might be hidden    Patient Care Team: [name], MD as PCP - General (Family Medicine) Nahser, Wonda Cheng, MD as PCP - Cardiology (Cardiology) Sherryl Barters, NP as Nurse Practitioner (Nurse Practitioner)    Assessment:   This is a routine wellness examination for Amy Morton.  The goal of the wellness visit is to assist the patient how to close the gaps in care and create a preventative care plan for the patient.   The roster of all physicians providing medical care to patient is listed in the Snapshot section of the chart.  Taking calcium VIT D as appropriate/Osteoporosis risk reviewed.    Safety issues reviewed; Smoke and carbon monoxide detectors in the home. No firearms in the home. Wears seatbelts when driving or riding with others. No  violence in the home.  They do not have excessive sun exposure.  Discussed the need for sun protection: hats, long sleeves and the use of sunscreen if there is significant sun exposure.  Patient is alert, normal appearance, oriented x3.   Correctly identified the president  of the [place]. Recalls of 1/3 words. Performs calculations starting with 20 subtracting by 3's.  Correctly read time from watch face.  Correctly says the months of the year in reverse.   States she has difficulty remembering day to day things, misplaced items that she cannot find and has lost her direction while driving a couple of times in familiar areas.  She is still concerned it may be due to residual grief of her daughter passing away in the last year and will continue to monitor as previously directed. She declines to schedule a follow up with her doctor today, however if symptoms of forgetfulness increase or believes she is putting herself in harms way, she will contact pcp.   No failures at ADL's or IADL's.  Manages her medications with weekly pill box. Cane or walking stick encouraged for unsteady gait.    BMI- discussed the importance of a healthy diet, water intake and the benefits of aerobic exercise. She plans to create a calendar to help monitor eating schedule and ensure she isn't skipping meals.  Ensure Max Protein Supplement drink provided with coupons for a meal or snack.  She plans to increase her water intake and practice balance exercises daily. Educational material provided.   24 hour diet recall: Low sodium diet.  Dental- dentures.  Sleep patterns- Sleeps 6 hours at night.  Wakes feeling rested.  Health maintenance gaps- closed.  Exercise Activities and Dietary recommendations Current Exercise Habits: The patient does not participate in regular exercise at present  Goals    . Increase water intake     Stay hydrated       Fall Risk Fall Risk  03/05/2018 03/04/2017 04/02/2016 03/04/2016  Falls in the past year? No No Yes Yes  Comment - - Emmi Telephone Survey: data to providers prior to load -  Number falls in past yr: - - 1 1  Comment - - Emmi Telephone Survey Actual Response = 1 -  Injury with Fall? - - Yes Yes  Follow up - - - Falls prevention discussed    Depression Screen PHQ 2/9 Scores 03/05/2018 02/08/2018 03/04/2017 03/04/2016  PHQ - 2 Score 0 0 0 0     Cognitive Function MMSE - Mini Mental State Exam 03/05/2018 03/04/2017 03/04/2016  Orientation to time 5 5 5   Orientation to Place 5 5 5   Registration 3 3 3   Attention/ Calculation 5 5 5   Recall 1 3 2   Recall-comments 1 out of 3 words recalled - -  Language- name 2 objects 2 2 2   Language- repeat 1 1 1   Language- follow 3 step command 3 3 3   Language- read & follow direction 1 1 1   Write a sentence 1 1 1   Copy design 1 1 1   Total score 28 30 29         Immunization History  Administered Date(s) Administered  . DTaP 06/23/2011  . Influenza,inj,Quad PF,6+ Mos 04/17/2014, 05/07/2015  . Influenza-Unspecified 04/13/2012, 04/08/2013, 04/17/2014, 05/07/2015, 05/02/2016  . Pneumococcal Conjugate-13 03/04/2016  . Pneumococcal Polysaccharide-23 11/20/2005, 11/21/2010  . Zoster 01/21/2011    Screening Tests Health Maintenance  Topic Date Due  . INFLUENZA VACCINE  02/25/2018  . TETANUS/TDAP  03/13/2026  . DEXA  SCAN  Completed  . PNA vac Low Risk Adult  Completed      Plan:   End of life planning; Advance aging; Advanced directives discussed. Copy of current HCPOA/Living Will on file.    Balance exercises.  Monitor meal calendar/schedule.  Notify your primary care physician if day to day memory worsens.   Let your family know when you are taking a drive alone.   I have personally reviewed and noted the following in the patient's chart:   . Medical and social history . Use of alcohol, tobacco or illicit drugs-none. . Current medications and supplements-on file. . Functional ability and status-100% . Nutritional status-low sodium diet. Marland Kitchen Physical activity-balancing/standing exercises.  . Advanced directives-on file.  . List of other physicians-see snap shot.  Marland Kitchen Hospitalizations, surgeries, and ER visits in previous 12 months-see chart review.  . Vitals-wnl . Screenings to  include cognitive, depression, and falls-MMS complete. No depression. No falls.  . Referrals and appointments  In addition, I have reviewed and discussed with patient certain preventive protocols, quality metrics, and best practice recommendations. A written personalized care plan for preventive services as well as general preventive health recommendations were provided to patient.     Varney Biles, LPN  11/02/759

## 2018-03-05 NOTE — Patient Instructions (Addendum)
  Ms. Mungo , Thank you for taking time to come for your Medicare Wellness Visit. I appreciate your ongoing commitment to your health goals. Please review the following plan we discussed and let me know if I can assist you in the future.   Balance exercises, as demonstrated.  Monitor meal calendar/schedule.  Notify your primary care physician if day to day memory worsens.   Let your family know when you are taking a drive alone.   Follow up as needed.  These are the goals we discussed: Goals    . Increase water intake     Stay hydrated       This is a list of the screening recommended for you and due dates:  Health Maintenance  Topic Date Due  . Flu Shot  02/25/2018  . Tetanus Vaccine  03/13/2026  . DEXA scan (bone density measurement)  Completed  . Pneumonia vaccines  Completed

## 2018-03-06 NOTE — Progress Notes (Signed)
I have reviewed the above note and agree.  Beryl Balz, M.D.  

## 2018-03-22 ENCOUNTER — Ambulatory Visit: Payer: Medicare Other | Admitting: Cardiovascular Disease

## 2018-04-08 ENCOUNTER — Other Ambulatory Visit: Payer: Self-pay | Admitting: Family Medicine

## 2018-04-12 ENCOUNTER — Other Ambulatory Visit: Payer: Self-pay | Admitting: Family Medicine

## 2018-05-07 ENCOUNTER — Encounter: Payer: Self-pay | Admitting: Cardiovascular Disease

## 2018-05-07 ENCOUNTER — Ambulatory Visit (INDEPENDENT_AMBULATORY_CARE_PROVIDER_SITE_OTHER): Payer: Medicare Other | Admitting: Cardiovascular Disease

## 2018-05-07 VITALS — BP 144/82 | HR 67 | Wt 148.8 lb

## 2018-05-07 DIAGNOSIS — I200 Unstable angina: Secondary | ICD-10-CM | POA: Diagnosis not present

## 2018-05-07 DIAGNOSIS — E785 Hyperlipidemia, unspecified: Secondary | ICD-10-CM | POA: Diagnosis not present

## 2018-05-07 DIAGNOSIS — I10 Essential (primary) hypertension: Secondary | ICD-10-CM | POA: Diagnosis not present

## 2018-05-07 DIAGNOSIS — I2510 Atherosclerotic heart disease of native coronary artery without angina pectoris: Secondary | ICD-10-CM | POA: Diagnosis not present

## 2018-05-07 DIAGNOSIS — I359 Nonrheumatic aortic valve disorder, unspecified: Secondary | ICD-10-CM

## 2018-05-07 DIAGNOSIS — Z23 Encounter for immunization: Secondary | ICD-10-CM

## 2018-05-07 NOTE — Progress Notes (Signed)
Cardiology Office Note   Date:  05/07/2018   ID:  Amy Morton, DOB Dec 25, 1934, MRN [id number]  PCP:  [name], MD  Cardiologist:   Kathlyn Sacramento, MD   Chief Complaint  Patient presents with  . other    3 mo f/u. Medications reviewed verbally.      History of Present Illness: Amy Morton is a 83 y.o. female who presents for for a follow-up visit regarding coronary artery disease and moderate aortic stenosis. Other medical problems include anemia, hypertension, hyperlipidemia, anxiety and depression. She had previous left circumflex PCI in 2016 with known occluded distal right coronary artery. The patient was hospitalized in June 2019 at [facility] with unstable angina.  She underwent cardiac catheterization which showed patent left circumflex stents without significant restenosis, stable discrete 70% stenosis in the mid LAD and chronically occluded distal RCA with collaterals.  Aortic stenosis was moderate with a peak to peak gradient of 20 mmHg.  LVEDP was mildly elevated.  Overall, there was no significant change in coronary anatomy since 2016. Echocardiogram showed an EF of 60 to 65% with mild LVH moderate tricuspid regurgitation, severely dilated left atrium and moderate aortic stenosis.  She is doing well with no chest pain, shortness of breath or palpitations.  She continues to be independent.  She drives and she lives by herself. She reports some decline in her memory but overall I think she is doing well.   Past Medical History:  Diagnosis Date  . Abnormal mammogram 2007   Recommend close follow up  . Anemia    Iron, b12, SPEP, UPEP normal, 12/2012  . Anxiety   . Aortic stenosis    mild to moderate  . aortic stenosis   . Arthritis   . Asthma   . CAD (coronary artery disease)    s/p angioplasty 1996  . Chronic cough   . Depression   . Diverticulosis   . Dyspnea   . Elevated LFTs 2010   s/p ultrasound w/ possible fatty liver; GI consult.  Resolution 2011  . Gastric ulcer   . GERD (gastroesophageal reflux disease)   . History of colonic polyps    Dr. Vira Agar  . HOH (hard of hearing)   . Hyperlipidemia   . Hypertension   . Peritoneal carcinomatosis [facility]) 2001   Dr. Oliva Bustard & Dr. Claiborne Rigg s/p chemo  . Shingles 2008   T-9 distribution  . Valvular heart disease    Mild to Moderate MR, AS  . Vitamin D deficiency   . Wheezing     Past Surgical History:  Procedure Laterality Date  . ABDOMINAL HYSTERECTOMY  2001  . APPENDECTOMY    . CARDIAC CATHETERIZATION  2007   50% mid LAD stenosis, 70% proximal RCA with 100% distal RCA stenosis with collaterals, EF 65%  . CARDIAC CATHETERIZATION N/A 04/27/2015   Procedure: Left Heart Cath and Coronary Angiography;  Surgeon: Jettie Booze, MD;  Location: [place] [facility];  Service: Cardiovascular;  Laterality: N/A;  . CARDIAC CATHETERIZATION N/A 04/27/2015   Procedure: Coronary Stent Intervention;  Surgeon: Jettie Booze, MD;  Location: [place] [facility];  Service: Cardiovascular;  Laterality: N/A;  . CATARACT EXTRACTION W/PHACO Left 09/25/2016   Procedure: CATARACT EXTRACTION PHACO AND INTRAOCULAR LENS PLACEMENT (IOC);  Surgeon: Eulogio Bear, MD;  Location: [facility];  Service: Ophthalmology;  Laterality: Left;  [place] 46.7 AP% 8.1 CDE 3.80 Fluid pack lot # [id number]  . CATARACT EXTRACTION W/PHACO Right 11/06/2016  Procedure: CATARACT EXTRACTION PHACO AND INTRAOCULAR LENS PLACEMENT (IOC);  Surgeon: Eulogio Bear, MD;  Location: [facility];  Service: Ophthalmology;  Laterality: Right;  [place] 49.5 AP8.7 CDE4.28 LOT # [id number]  . CHOLECYSTECTOMY  1987  . COLECTOMY  2001  . CORONARY ANGIOPLASTY  1996  . CORONARY ANGIOPLASTY    . LAPAROSCOPIC BILATERAL SALPINGO OOPHERECTOMY  1988   Dr. Laverta Baltimore  . LEFT HEART CATH AND CORONARY ANGIOGRAPHY N/A 01/25/2018   Procedure: LEFT HEART CATH AND CORONARY ANGIOGRAPHY;  Surgeon: Wellington Hampshire, MD;  Location: [place] [facility];   Service: Cardiovascular;  Laterality: N/A;  . TONSILLECTOMY  1950     Current Outpatient Medications  Medication Sig Dispense Refill  . acetaminophen (TYLENOL) 650 MG CR tablet Take 1,000 mg by mouth 2 (two) times daily.     Marland Kitchen ADVAIR DISKUS 250-50 MCG/DOSE AEPB INHALE 1 PUFF INTO THE LUNGS TWICE DAILY 60 each 2  . albuterol (PROVENTIL HFA;VENTOLIN HFA) 108 (90 BASE) MCG/ACT inhaler Inhale 2 puffs into the lungs every 4 (four) hours as needed for wheezing or shortness of breath.     Marland Kitchen aspirin 81 MG tablet Take 81 mg by mouth daily.    Marland Kitchen atorvastatin (LIPITOR) 80 MG tablet TAKE 1 TABLET BY MOUTH DAILY AT 6PM 90 tablet 2  . calcium gluconate 650 MG tablet Take 650 mg by mouth 2 (two) times daily.    . Cholecalciferol 5000 units TABS Take 5,000 Units by mouth daily.    . citalopram (CELEXA) 20 MG tablet TAKE 1 TABLET BY MOUTH DAILY 90 tablet 0  . clopidogrel (PLAVIX) 75 MG tablet TAKE 1 TABLET BY MOUTH DAILY WITH BREAKFAST 90 tablet 2  . isosorbide mononitrate (IMDUR) 30 MG 24 hr tablet TAKE 1 TABLET(30 MG) BY MOUTH DAILY 90 tablet 2  . metoprolol (LOPRESSOR) 50 MG tablet TAKE 1 TABLET BY MOUTH TWICE DAILY 180 tablet 3  . mirtazapine (REMERON) 7.5 MG tablet TAKE 1 TABLET(7.5 MG) BY MOUTH AT BEDTIME 90 tablet 2  . Multiple Vitamin (MULTIVITAMIN) capsule Take 1 capsule by mouth daily.    . nitroGLYCERIN (NITROSTAT) 0.4 MG SL tablet PLACE 1 TABLET UNDER THE TONGUE EVERY 5 MINUTES AS NEEDED FOR CHEST PAIN, 3 DOSES MAX 75 tablet 3  . omega-3 acid ethyl esters (LOVAZA) 1 g capsule TAKE 2 CAPSULES BY MOUTH TWICE DAILY 360 capsule 1  . pantoprazole (PROTONIX) 40 MG tablet Take 1 tablet (40 mg total) by mouth daily. 90 tablet 2  . vitamin B-12 (CYANOCOBALAMIN) 250 MCG tablet Take 250 mcg by mouth daily.     No current facility-administered medications for this visit.    Facility-Administered Medications Ordered in Other Visits  Medication Dose Route Frequency Provider Last Rate Last Dose  . sodium  chloride 0.9 % nebulizer solution 3 mL  3 mL Nebulization Once [name], MD       Followed by  . methacholine (PROVOCHOLINE) inhaler solution 0.125 mg  2 mL Inhalation Once [name], MD       Followed by  . methacholine (PROVOCHOLINE) inhaler solution 0.5 mg  2 mL Inhalation Once [name], MD       Followed by  . methacholine (PROVOCHOLINE) inhaler solution 2 mg  2 mL Inhalation Once [name], MD       Followed by  . methacholine (PROVOCHOLINE) inhaler solution 8 mg  2 mL Inhalation Once [name], MD       Followed by  .  methacholine (PROVOCHOLINE) inhaler solution 32 mg  2 mL Inhalation Once [name], MD        Allergies:   Abilify [aripiprazole]    Social History:  The patient  reports that she has never smoked. She has never used smokeless tobacco. She reports that she does not drink alcohol or use drugs.   Family History:  The patient's family history includes Cancer in her father; Cancer (age of onset: 29) in her daughter; Diabetes in her brother and sister; Heart disease in her brother; Hypertension in her mother; Stroke (age of onset: 51) in her mother.    ROS:  Please see the history of present illness.   Otherwise, review of systems are positive for none.   All other systems are reviewed and negative.    PHYSICAL EXAM: VS:  BP (!) 144/82 (BP Location: Left Arm, Patient Position: Sitting, Cuff Size: Normal)   Pulse 67   Wt 148 lb 12 oz (67.5 kg)   BMI 30.04 kg/m  , BMI Body mass index is 30.04 kg/m. GEN: Well nourished, well developed, in no acute distress  HEENT: normal  Neck: no JVD, carotid bruits, or masses Cardiac: RRR; no rubs, or gallops,no edema .  2 out of 6 crescendo decrescendo systolic murmur in the aortic area which is mid peaking. Respiratory:  clear to auscultation bilaterally, normal work of breathing GI: soft, nontender, nondistended, + BS MS: no deformity or atrophy  Skin: warm and dry, no  rash Neuro:  Strength and sensation are intact Psych: euthymic mood, full affect   EKG:  EKG is ordered today. The ekg ordered today demonstrates normal sinus rhythm with minimal LVH.   Recent Labs: 02/08/2018: Hemoglobin 11.9; Platelets 175.0; TSH 1.93 02/19/2018: ALT 28; BUN 16; Creatinine, Ser 0.74; Potassium 4.5; Sodium 137    Lipid Panel    Component Value Date/Time   CHOL 115 09/03/2017 1049   TRIG 179 (H) 09/03/2017 1049   HDL 50 09/03/2017 1049   CHOLHDL 2.3 09/03/2017 1049   CHOLHDL 2.1 04/28/2016 1003   VLDL 30 04/28/2016 1003   LDLCALC 29 09/03/2017 1049      Wt Readings from Last 3 Encounters:  05/07/18 148 lb 12 oz (67.5 kg)  03/05/18 144 lb 12.8 oz (65.7 kg)  02/08/18 146 lb 3.2 oz (66.3 kg)       No flowsheet data found.    ASSESSMENT AND PLAN:  1.  Coronary artery disease involving native coronary arteries without angina: Overall she is doing well.  I recommend continuing medical therapy.  2.  Moderate aortic stenosis: Repeat echocardiogram in June 2020.  3.  Essential hypertension: Blood pressure is reasonably controlled on current medications.  4.  Hyperlipidemia: Continue treatment with high-dose atorvastatin.  Most recent LDL was 29.  The patient was given the flu shot today.    Disposition:   FU with me in 6 months  Signed,  Kathlyn Sacramento, MD  05/07/2018 1:53 PM    [place]

## 2018-05-07 NOTE — Patient Instructions (Signed)
Medication Instructions:  Continue same medications.   If you need a refill on your cardiac medications before your next appointment, please call your pharmacy.   Lab work: None  Testing/Procedures: None   Follow-Up: In 6 months  At [facility], you and your health needs are our priority.  As part of our continuing mission to provide you with exceptional heart care, we have created designated Provider Care Teams.  These Care Teams include your primary Cardiologist (physician) and Advanced Practice Providers (APPs -  Physician Assistants and Nurse Practitioners) who all work together to provide you with the care you need, when you need it.

## 2018-05-08 ENCOUNTER — Other Ambulatory Visit: Payer: Self-pay | Admitting: Family Medicine

## 2018-05-17 ENCOUNTER — Other Ambulatory Visit: Payer: Self-pay | Admitting: Cardiovascular Disease

## 2018-07-12 ENCOUNTER — Other Ambulatory Visit: Payer: Self-pay

## 2018-07-12 MED ORDER — CITALOPRAM HYDROBROMIDE 20 MG PO TABS: 20.0000 mg | ORAL_TABLET | Freq: Every day | ORAL | 0 refills | Status: DC

## 2018-07-31 ENCOUNTER — Other Ambulatory Visit: Payer: Self-pay | Admitting: Internal Medicine

## 2018-07-31 ENCOUNTER — Other Ambulatory Visit: Payer: Self-pay | Admitting: Family Medicine

## 2018-08-13 ENCOUNTER — Encounter: Payer: Self-pay | Admitting: Family Medicine

## 2018-08-13 ENCOUNTER — Ambulatory Visit (INDEPENDENT_AMBULATORY_CARE_PROVIDER_SITE_OTHER): Payer: Medicare Other | Admitting: Family Medicine

## 2018-08-13 ENCOUNTER — Ambulatory Visit (INDEPENDENT_AMBULATORY_CARE_PROVIDER_SITE_OTHER): Payer: Medicare Other

## 2018-08-13 VITALS — BP 150/80 | HR 97 | Temp 98.4°F | Resp 17 | Ht 59.0 in | Wt 145.4 lb

## 2018-08-13 DIAGNOSIS — R0989 Other specified symptoms and signs involving the circulatory and respiratory systems: Secondary | ICD-10-CM | POA: Diagnosis not present

## 2018-08-13 DIAGNOSIS — J40 Bronchitis, not specified as acute or chronic: Secondary | ICD-10-CM | POA: Insufficient documentation

## 2018-08-13 DIAGNOSIS — E785 Hyperlipidemia, unspecified: Secondary | ICD-10-CM | POA: Diagnosis not present

## 2018-08-13 DIAGNOSIS — R05 Cough: Secondary | ICD-10-CM | POA: Diagnosis not present

## 2018-08-13 DIAGNOSIS — I2583 Coronary atherosclerosis due to lipid rich plaque: Secondary | ICD-10-CM | POA: Diagnosis not present

## 2018-08-13 DIAGNOSIS — R7309 Other abnormal glucose: Secondary | ICD-10-CM

## 2018-08-13 DIAGNOSIS — R42 Dizziness and giddiness: Secondary | ICD-10-CM

## 2018-08-13 DIAGNOSIS — K219 Gastro-esophageal reflux disease without esophagitis: Secondary | ICD-10-CM | POA: Diagnosis not present

## 2018-08-13 DIAGNOSIS — R6889 Other general symptoms and signs: Secondary | ICD-10-CM | POA: Diagnosis not present

## 2018-08-13 DIAGNOSIS — I2510 Atherosclerotic heart disease of native coronary artery without angina pectoris: Secondary | ICD-10-CM | POA: Diagnosis not present

## 2018-08-13 LAB — POC INFLUENZA A&B (BINAX/QUICKVUE)
Influenza A, POC: NEGATIVE
Influenza B, POC: NEGATIVE

## 2018-08-13 MED ORDER — AZITHROMYCIN 250 MG PO TABS: ORAL_TABLET | ORAL | 0 refills | Status: DC

## 2018-08-13 MED ORDER — PREDNISONE 20 MG PO TABS: 40.0000 mg | ORAL_TABLET | Freq: Every day | ORAL | 0 refills | Status: DC

## 2018-08-13 NOTE — Progress Notes (Signed)
[name], MD Phone: [phone]  Amy Morton is a 84 y.o. female who presents today for f/u.  CC: Cough, CAD, hyperlipidemia, GERD, lightheadedness  Cough: Patient notes this started 3 days ago.  She notes significant wheezing and chest congestion with cough productive of yellow mucus.  She does note some discomfort in her musculature of her abdomen and chest with coughing though no other chest pain.  She notes no fevers.  She does note some shortness of breath with this.  She uses albuterol with good benefit.  Last used this morning.  No sick contacts.  She does note some body aches.  Relatively sudden onset.  She is using Advair though notes she has never been told she has COPD.  CAD/hyperlipidemia: No chest pressure.  No shortness of breath prior to her current illness.  No edema.  Taking Lipitor, Plavix, Imdur, aspirin, and metoprolol.  GERD: Taking Protonix.  No reflux, blood in stool, dysphagia, or abdominal pain.  Lightheadedness: Patient notes for some time now she has felt lightheaded when getting up from a seated position or looks up.  She also notes this can occur if she looks up.  No vertigo symptoms.  Social History   Tobacco Use  Smoking Status Never Smoker  Smokeless Tobacco Never Used     ROS see history of present illness  Objective  Physical Exam Vitals:   08/13/18 1304  BP: (!) 150/80  Pulse: 97  Resp: 17  Temp: 98.4 F (36.9 C)  SpO2: 94%    BP Readings from Last 3 Encounters:  08/13/18 (!) 150/80  05/07/18 (!) 144/82  03/05/18 110/74   Wt Readings from Last 3 Encounters:  08/13/18 145 lb 6 oz (65.9 kg)  05/07/18 148 lb 12 oz (67.5 kg)  03/05/18 144 lb 12.8 oz (65.7 kg)    Physical Exam Constitutional:      General: She is not in acute distress.    Appearance: She is not diaphoretic.  HENT:     Mouth/Throat:     Mouth: Mucous membranes are moist.     Pharynx: Oropharynx is clear.  Eyes:     Conjunctiva/sclera: Conjunctivae  normal.     Pupils: Pupils are equal, round, and reactive to light.  Cardiovascular:     Rate and Rhythm: Normal rate and regular rhythm.     Heart sounds: Murmur (1/6 systolic murmur) present.  Pulmonary:     Effort: Pulmonary effort is normal. No respiratory distress.     Breath sounds: Wheezing (Scattered expiratory wheezes) present.     Comments: Coarse breath sounds bilaterally Lymphadenopathy:     Cervical: No cervical adenopathy.  Skin:    General: Skin is warm and dry.  Neurological:     Mental Status: She is alert.      Assessment/Plan: Please see individual problem list.  CAD (coronary artery disease) Asymptomatic.  She will continue to follow with cardiology.  Orthostatic lightheadedness Symptoms most consistent with orthostasis and orthostatic vital signs are positive.  Encouraged adequate hydration and rising slowly from seated positions.  She will monitor her blood pressure at home.  If consistently running greater than 140/80 she will let us know.  Bronchitis Symptoms most consistent with bronchitis.  Rapid flu test negative.  We will check a chest x-ray.  Will treat with prednisone.  Will treat with azithromycin.  She will continue her albuterol inhaler.  She is given return precautions.  GERD (gastroesophageal reflux disease) Continue Protonix.  Dyslipidemia, goal LDL below 70  She will return in 1 month for labs.   Orders Placed This Encounter  Procedures  . DG Chest 2 View    Standing Status:   Future    Number of Occurrences:   1    Standing Expiration Date:   08/14/2019    Order Specific Question:   Reason for Exam (SYMPTOM  OR DIAGNOSIS REQUIRED)    Answer:   cough, wheezing, dyspnea    Order Specific Question:   Preferred imaging location?    Answer:   [facility] Specific Question:   Radiology Contrast Protocol - do NOT remove file path    Answer:   [email or web address].pdf  . Lipid panel      Standing Status:   Future    Standing Expiration Date:   08/14/2019  . Hemoglobin A1C    Standing Status:   Future    Standing Expiration Date:   08/14/2019  . Comp Met (CMET)    Standing Status:   Future    Standing Expiration Date:   08/14/2019  . POC Influenza A&B (Binax test)    Meds ordered this encounter  Medications  . predniSONE (DELTASONE) 20 MG tablet    Sig: Take 2 tablets (40 mg total) by mouth daily with breakfast.    Dispense:  10 tablet    Refill:  0  . azithromycin (ZITHROMAX) 250 MG tablet    Sig: Take 500 mg (2 tablets) by mouth on day 1, then take 250 mg (1 tablet) by mouth on days 2 through 5    Dispense:  6 tablet    Refill:  0     [name], MD [place]

## 2018-08-13 NOTE — Patient Instructions (Signed)
Nice to see you. We will get a chest x-ray today. We will treat you with azithromycin and prednisone for likely bronchitis. Please try to stay adequately hydrated.  Please rise slowly from seated position. If you develop chest pain, shortness of breath, cough productive of blood, fevers, or any new or changing symptoms please seek medical attention immediately.

## 2018-08-13 NOTE — Assessment & Plan Note (Signed)
Asymptomatic.  She will continue to follow with cardiology. 

## 2018-08-13 NOTE — Assessment & Plan Note (Signed)
She will return in 1 month for labs.

## 2018-08-13 NOTE — Assessment & Plan Note (Signed)
Continue Protonix °

## 2018-08-13 NOTE — Assessment & Plan Note (Addendum)
Symptoms most consistent with orthostasis and orthostatic vital signs are positive.  Encouraged adequate hydration and rising slowly from seated positions.  She will monitor her blood pressure at home.  If consistently running greater than 140/80 she will let us know.

## 2018-08-13 NOTE — Assessment & Plan Note (Addendum)
Symptoms most consistent with bronchitis.  Rapid flu test negative.  We will check a chest x-ray.  Will treat with prednisone.  Will treat with azithromycin.  She will continue her albuterol inhaler.  She is given return precautions.

## 2018-08-29 ENCOUNTER — Other Ambulatory Visit: Payer: Self-pay | Admitting: Cardiovascular Disease

## 2018-09-12 ENCOUNTER — Other Ambulatory Visit: Payer: Self-pay | Admitting: Cardiovascular Disease

## 2018-09-13 ENCOUNTER — Other Ambulatory Visit (INDEPENDENT_AMBULATORY_CARE_PROVIDER_SITE_OTHER): Payer: Medicare Other

## 2018-09-13 ENCOUNTER — Other Ambulatory Visit: Payer: Self-pay | Admitting: Cardiovascular Disease

## 2018-09-13 DIAGNOSIS — E785 Hyperlipidemia, unspecified: Secondary | ICD-10-CM | POA: Diagnosis not present

## 2018-09-13 DIAGNOSIS — I2583 Coronary atherosclerosis due to lipid rich plaque: Secondary | ICD-10-CM | POA: Diagnosis not present

## 2018-09-13 DIAGNOSIS — R7309 Other abnormal glucose: Secondary | ICD-10-CM | POA: Diagnosis not present

## 2018-09-13 DIAGNOSIS — I2510 Atherosclerotic heart disease of native coronary artery without angina pectoris: Secondary | ICD-10-CM

## 2018-09-13 LAB — COMPREHENSIVE METABOLIC PANEL
ALT: 28 U/L (ref 0–35)
AST: 36 U/L (ref 0–37)
Albumin: 4.5 g/dL (ref 3.5–5.2)
Alkaline Phosphatase: 52 U/L (ref 39–117)
BUN: 16 mg/dL (ref 6–23)
CO2: 27 mEq/L (ref 19–32)
Calcium: 9.5 mg/dL (ref 8.4–10.5)
Chloride: 99 mEq/L (ref 96–112)
Creatinine, Ser: 0.74 mg/dL (ref 0.40–1.20)
GFR: 75.25 mL/min (ref >60.00)
Glucose, Bld: 126 mg/dL — ABNORMAL HIGH (ref 70–99)
Potassium: 4.3 mEq/L (ref 3.5–5.1)
Sodium: 135 mEq/L (ref 135–145)
Total Bilirubin: 0.6 mg/dL (ref 0.2–1.2)
Total Protein: 7.2 g/dL (ref 6.0–8.3)

## 2018-09-13 LAB — LIPID PANEL
Cholesterol: 90 mg/dL (ref 0–200)
HDL: 39 mg/dL — ABNORMAL LOW (ref >39.00)
LDL Cholesterol: 20 mg/dL (ref 0–99)
NonHDL: 50.63
Total CHOL/HDL Ratio: 2
Triglycerides: 153 mg/dL — ABNORMAL HIGH (ref 0.0–149.0)
VLDL: 30.6 mg/dL (ref 0.0–40.0)

## 2018-09-13 LAB — HEMOGLOBIN A1C: Hgb A1c MFr Bld: 6.5 % (ref 4.6–6.5)

## 2018-09-14 NOTE — Telephone Encounter (Signed)
Looks like patient is following up in [place]

## 2018-09-16 ENCOUNTER — Other Ambulatory Visit: Payer: Self-pay | Admitting: Family Medicine

## 2018-09-16 DIAGNOSIS — E785 Hyperlipidemia, unspecified: Secondary | ICD-10-CM

## 2018-09-16 MED ORDER — ATORVASTATIN CALCIUM 80 MG PO TABS: 40.0000 mg | ORAL_TABLET | Freq: Every day | ORAL | 1 refills | Status: DC

## 2018-09-22 ENCOUNTER — Other Ambulatory Visit: Payer: Self-pay | Admitting: Cardiovascular Disease

## 2018-09-22 ENCOUNTER — Other Ambulatory Visit: Payer: Self-pay | Admitting: Family Medicine

## 2018-09-22 NOTE — Telephone Encounter (Signed)
Dr. Caryl Bis, I'm still learning about certain med's this is coming up about her tryglicerides so can she be approved for this medication?

## 2018-09-22 NOTE — Telephone Encounter (Signed)
This is a [place] pt 

## 2018-10-12 ENCOUNTER — Other Ambulatory Visit (INDEPENDENT_AMBULATORY_CARE_PROVIDER_SITE_OTHER): Payer: Medicare Other

## 2018-10-12 ENCOUNTER — Other Ambulatory Visit: Payer: Self-pay

## 2018-10-12 DIAGNOSIS — E785 Hyperlipidemia, unspecified: Secondary | ICD-10-CM

## 2018-10-12 LAB — LDL CHOLESTEROL, DIRECT: Direct LDL: 52 mg/dL

## 2018-11-10 ENCOUNTER — Other Ambulatory Visit: Payer: Self-pay

## 2018-11-10 MED ORDER — ATORVASTATIN CALCIUM 80 MG PO TABS: 40.0000 mg | ORAL_TABLET | Freq: Every day | ORAL | 2 refills | Status: DC

## 2018-12-17 ENCOUNTER — Other Ambulatory Visit: Payer: Self-pay | Admitting: *Deleted

## 2018-12-17 ENCOUNTER — Other Ambulatory Visit: Payer: Self-pay | Admitting: Cardiovascular Disease

## 2018-12-17 MED ORDER — ISOSORBIDE MONONITRATE ER 30 MG PO TB24: ORAL_TABLET | ORAL | 0 refills | Status: DC

## 2018-12-17 NOTE — Telephone Encounter (Signed)
Please advise if ok to refill Pantoprazole 40 mg qd last filled by [facility].

## 2018-12-29 ENCOUNTER — Other Ambulatory Visit: Payer: Self-pay

## 2018-12-29 MED ORDER — OMEGA-3-ACID ETHYL ESTERS 1 G PO CAPS: 2.0000 | ORAL_CAPSULE | Freq: Two times a day (BID) | ORAL | 1 refills | Status: DC

## 2019-01-03 ENCOUNTER — Telehealth: Payer: Self-pay | Admitting: Cardiovascular Disease

## 2019-01-03 ENCOUNTER — Other Ambulatory Visit: Payer: Self-pay

## 2019-01-03 MED ORDER — PANTOPRAZOLE SODIUM 40 MG PO TBEC: 40.0000 mg | DELAYED_RELEASE_TABLET | Freq: Every day | ORAL | 2 refills | Status: DC

## 2019-01-03 NOTE — Telephone Encounter (Signed)
pantoprazole (PROTONIX) 40 MG tablet 90 tablet 2 01/03/2019    Sig - Route: Take 1 tablet (40 mg total) by mouth daily. - Oral   Sent to pharmacy as: pantoprazole (PROTONIX) 40 MG tablet   E-Prescribing Status: Receipt confirmed by pharmacy (01/03/2019 12:02 PM EDT)   [id number] - [place], [place]

## 2019-01-03 NOTE — Telephone Encounter (Signed)
°*  STAT* If patient is at the pharmacy, call can be transferred to refill team.   1. Which medications need to be refilled? (please list name of each medication and dose if known) pantoprazole 40 MG 1 daily   2. Which pharmacy/location (including street and city if local pharmacy) is medication to be sent to? [facility] on [address] [phone]  3. Do they need a 30 day or 90 day supply? 90 day

## 2019-02-11 ENCOUNTER — Encounter: Payer: Self-pay | Admitting: Family Medicine

## 2019-02-11 ENCOUNTER — Ambulatory Visit (INDEPENDENT_AMBULATORY_CARE_PROVIDER_SITE_OTHER): Payer: Medicare Other | Admitting: Family Medicine

## 2019-02-11 ENCOUNTER — Other Ambulatory Visit: Payer: Self-pay

## 2019-02-11 DIAGNOSIS — J449 Chronic obstructive pulmonary disease, unspecified: Secondary | ICD-10-CM | POA: Diagnosis not present

## 2019-02-11 DIAGNOSIS — R7303 Prediabetes: Secondary | ICD-10-CM

## 2019-02-11 DIAGNOSIS — E785 Hyperlipidemia, unspecified: Secondary | ICD-10-CM

## 2019-02-11 DIAGNOSIS — I2510 Atherosclerotic heart disease of native coronary artery without angina pectoris: Secondary | ICD-10-CM

## 2019-02-11 DIAGNOSIS — Z859 Personal history of malignant neoplasm, unspecified: Secondary | ICD-10-CM

## 2019-02-11 DIAGNOSIS — I350 Nonrheumatic aortic (valve) stenosis: Secondary | ICD-10-CM

## 2019-02-11 DIAGNOSIS — I2583 Coronary atherosclerosis due to lipid rich plaque: Secondary | ICD-10-CM | POA: Diagnosis not present

## 2019-02-11 NOTE — Assessment & Plan Note (Signed)
Doing quite well with this.  No significant symptoms.  She will continue albuterol as needed.

## 2019-02-11 NOTE — Assessment & Plan Note (Signed)
She will discuss follow-up echo with cardiology.

## 2019-02-11 NOTE — Assessment & Plan Note (Signed)
No issues with this in many years.  She will monitor.

## 2019-02-11 NOTE — Assessment & Plan Note (Signed)
Asymptomatic.  She will continue her current medications.  She will continue to follow with cardiology.

## 2019-02-11 NOTE — Assessment & Plan Note (Signed)
Most recent LDL 52.  We will have her come in for a lipid panel to ensure stability of her cholesterol with her 40 mg dose of Lipitor.

## 2019-02-11 NOTE — Assessment & Plan Note (Signed)
Check A1c. 

## 2019-02-11 NOTE — Progress Notes (Signed)
Virtual Visit via telephone Note  This visit type was conducted due to national recommendations for restrictions regarding the COVID-19 pandemic (e.g. social distancing).  This format is felt to be most appropriate for this patient at this time.  All issues noted in this document were discussed and addressed.  No physical exam was performed (except for noted visual exam findings with Video Visits).   I connected with Amy Morton today at 10:00 AM EDT by telephone and verified that I am speaking with the correct person using two identifiers. Location patient: home Location provider: work  Persons participating in the virtual visit: patient, provider  I discussed the limitations, risks, security and privacy concerns of performing an evaluation and management service by telephone and the availability of in person appointments. I also discussed with the patient that there may be a patient responsible charge related to this service. The patient expressed understanding and agreed to proceed.  Interactive audio and video telecommunications were attempted between this provider and patient, however failed, due to patient having technical difficulties OR patient did not have access to video capability.  We continued and completed visit with audio only.   Reason for visit: follow-up  HPI: HYPERLIPIDEMIA Symptoms Chest pain on exertion:  no   Medications: Compliance- taking lipitor Right upper quadrant pain- no  Muscle aches- no  CAD: Taking Imdur, metoprolol, and aspirin.  She has not had to use nitroglycerin.  No chest pain, shortness of breath, or edema.  Prediabetes: No polyuria or polydipsia.  COPD: Prior pulmonary function testing with moderate obstructive airway disease with significant bronchodilator response.  The patient is not taking Advair currently.  She has no issues with cough, shortness of breath, wheezing, or congestion.  She will rarely have to use her albuterol  inhaler.  History of peritoneal carcinoma: She has no abdominal pain.  She was released by oncology many years ago.  She notes this occurred greater than 20 years ago.  Moderate aortic stenosis: It appears she is due for an echo.  She has follow-up with cardiology later this month and she will discuss that with them.     ROS: See pertinent positives and negatives per HPI.  Past Medical History:  Diagnosis Date   Abnormal mammogram 2007   Recommend close follow up   Anemia    Iron, b12, SPEP, UPEP normal, 12/2012   Anxiety    Aortic stenosis    mild to moderate   aortic stenosis    Arthritis    Asthma    CAD (coronary artery disease)    s/p angioplasty 1996   Chronic cough    Depression    Diverticulosis    Dyspnea    Elevated LFTs 2010   s/p ultrasound w/ possible fatty liver; GI consult. Resolution 2011   Gastric ulcer    GERD (gastroesophageal reflux disease)    History of colonic polyps    Dr. Vira Agar   [facility] (hard of hearing)    Hyperlipidemia    Hypertension    Peritoneal carcinomatosis [facility]) 2001   Dr. Oliva Bustard & Dr. Claiborne Rigg s/p chemo   Shingles 2008   T-9 distribution   Valvular heart disease    Mild to Moderate MR, AS   Vitamin D deficiency    Wheezing     Past Surgical History:  Procedure Laterality Date   ABDOMINAL HYSTERECTOMY  2001   APPENDECTOMY     CARDIAC CATHETERIZATION  2007   50% mid LAD stenosis, 70% proximal RCA  with 100% distal RCA stenosis with collaterals, EF 65%   CARDIAC CATHETERIZATION N/A 04/27/2015   Procedure: Left Heart Cath and Coronary Angiography;  Surgeon: Jettie Booze, MD;  Location: [facility];  Service: Cardiovascular;  Laterality: N/A;   CARDIAC CATHETERIZATION N/A 04/27/2015   Procedure: Coronary Stent Intervention;  Surgeon: Jettie Booze, MD;  Location: [place] [facility];  Service: Cardiovascular;  Laterality: N/A;   CATARACT EXTRACTION W/PHACO Left 09/25/2016    Procedure: CATARACT EXTRACTION PHACO AND INTRAOCULAR LENS PLACEMENT (IOC);  Surgeon: Eulogio Bear, MD;  Location: [facility];  Service: Ophthalmology;  Laterality: Left;  [place] 46.7 AP% 8.1 CDE 3.80 Fluid pack lot # [id number]   CATARACT EXTRACTION W/PHACO Right 11/06/2016   Procedure: CATARACT EXTRACTION PHACO AND INTRAOCULAR LENS PLACEMENT (IOC);  Surgeon: Eulogio Bear, MD;  Location: [facility];  Service: Ophthalmology;  Laterality: Right;  [place] 49.5 AP8.7 CDE4.28 LOT # [id number]   CHOLECYSTECTOMY  1987   COLECTOMY  2001   CORONARY ANGIOPLASTY  1996   CORONARY ANGIOPLASTY     LAPAROSCOPIC BILATERAL SALPINGO OOPHERECTOMY  1988   Dr. [place] CATH AND CORONARY ANGIOGRAPHY N/A 01/25/2018   Procedure: LEFT HEART CATH AND CORONARY ANGIOGRAPHY;  Surgeon: Wellington Hampshire, MD;  Location: [place] [facility];  Service: Cardiovascular;  Laterality: N/A;   TONSILLECTOMY  1950    Family History  Problem Relation Age of Onset   Hypertension Mother    Stroke Mother 79       Cerebral Hemorrhage   Cancer Father        bone cancer   Diabetes Sister    Diabetes Brother    Heart disease Brother    Cancer Daughter 23       Ovarian cancer   Colon cancer Neg Hx    Colon polyps Neg Hx     SOCIAL HX: Non-smoker.   Current Outpatient Medications:    acetaminophen (TYLENOL) 650 MG CR tablet, Take 1,000 mg by mouth 2 (two) times daily. , Disp: , Rfl:    ADVAIR DISKUS 250-50 MCG/DOSE AEPB, INHALE 1 PUFF INTO THE LUNGS TWICE DAILY, Disp: 60 each, Rfl: 2   albuterol (PROVENTIL HFA;VENTOLIN HFA) 108 (90 BASE) MCG/ACT inhaler, Inhale 2 puffs into the lungs every 4 (four) hours as needed for wheezing or shortness of breath. , Disp: , Rfl:    aspirin 81 MG tablet, Take 81 mg by mouth daily., Disp: , Rfl:    atorvastatin (LIPITOR) 80 MG tablet, Take 0.5 tablets (40 mg total) by mouth daily at 6 PM., Disp: 45 tablet, Rfl: 2   calcium gluconate 650 MG tablet, Take 650 mg by  mouth 2 (two) times daily., Disp: , Rfl:    Cholecalciferol 5000 units TABS, Take 5,000 Units by mouth daily., Disp: , Rfl:    citalopram (CELEXA) 20 MG tablet, TAKE 1 TABLET(20 MG) BY MOUTH DAILY, Disp: 90 tablet, Rfl: 2   clopidogrel (PLAVIX) 75 MG tablet, TAKE 1 TABLET BY MOUTH DAILY WITH BREAKFAST, Disp: 90 tablet, Rfl: 2   isosorbide mononitrate (IMDUR) 30 MG 24 hr tablet, TAKE 1 TABLET(30 MG) BY MOUTH DAILY, Disp: 90 tablet, Rfl: 0   metoprolol (LOPRESSOR) 50 MG tablet, TAKE 1 TABLET BY MOUTH TWICE DAILY, Disp: 180 tablet, Rfl: 3   metoprolol tartrate (LOPRESSOR) 50 MG tablet, TAKE 1 TABLET(50 MG) BY MOUTH TWICE DAILY, Disp: 180 tablet, Rfl: 2   mirtazapine (REMERON) 7.5 MG tablet, TAKE 1 TABLET(7.5 MG) BY  MOUTH AT BEDTIME, Disp: 90 tablet, Rfl: 2   Multiple Vitamin (MULTIVITAMIN) capsule, Take 1 capsule by mouth daily., Disp: , Rfl:    nitroGLYCERIN (NITROSTAT) 0.4 MG SL tablet, PLACE 1 TABLET UNDER THE TONGUE EVERY 5 MINUTES AS NEEDED FOR CHEST PAIN, 3 DOSES MAX, Disp: 75 tablet, Rfl: 3   omega-3 acid ethyl esters (LOVAZA) 1 g capsule, Take 2 capsules (2 g total) by mouth 2 (two) times daily., Disp: 360 capsule, Rfl: 1   pantoprazole (PROTONIX) 40 MG tablet, Take 1 tablet (40 mg total) by mouth daily., Disp: 90 tablet, Rfl: 2   predniSONE (DELTASONE) 20 MG tablet, Take 2 tablets (40 mg total) by mouth daily with breakfast., Disp: 10 tablet, Rfl: 0   vitamin B-12 (CYANOCOBALAMIN) 250 MCG tablet, Take 250 mcg by mouth daily., Disp: , Rfl:  No current facility-administered medications for this visit.   Facility-Administered Medications Ordered in Other Visits:    sodium chloride 0.9 % nebulizer solution 3 mL, 3 mL, Nebulization, Once **FOLLOWED BY** methacholine (PROVOCHOLINE) inhaler solution 0.125 mg, 2 mL, Inhalation, Once **FOLLOWED BY** methacholine (PROVOCHOLINE) inhaler solution 0.5 mg, 2 mL, Inhalation, Once **FOLLOWED BY** methacholine (PROVOCHOLINE) inhaler solution 2  mg, 2 mL, Inhalation, Once **FOLLOWED BY** methacholine (PROVOCHOLINE) inhaler solution 8 mg, 2 mL, Inhalation, Once **FOLLOWED BY** methacholine (PROVOCHOLINE) inhaler solution 32 mg, 2 mL, Inhalation, Once **FOLLOWED BY** [COMPLETED] albuterol (PROVENTIL) (2.5 MG/3ML) 0.083% nebulizer solution 2.5 mg, 2.5 mg, Nebulization, Once, [name], MD, 2.5 mg at 08/27/17 0908  EXAM: This was a telehealth telephone visit notes no physical exam was completed.  ASSESSMENT AND PLAN:  Discussed the following assessment and plan:  CAD (coronary artery disease) Asymptomatic.  She will continue her current medications.  She will continue to follow with cardiology.  Dyslipidemia, goal LDL below 70 Most recent LDL 52.  We will have her come in for a lipid panel to ensure stability of her cholesterol with her 40 mg dose of Lipitor.  History of peritoneal carcinoma No issues with this in many years.  She will monitor.  COPD (chronic obstructive pulmonary disease) (HCC) Doing quite well with this.  No significant symptoms.  She will continue albuterol as needed.  Prediabetes Check A1c.  Nonrheumatic aortic valve stenosis She will discuss follow-up echo with cardiology.   Labs scheduled for patient during visit.  Social distancing precautions and sick precautions given regarding COVID-19.   I discussed the assessment and treatment plan with the patient. The patient was provided an opportunity to ask questions and all were answered. The patient agreed with the plan and demonstrated an understanding of the instructions.   The patient was advised to call back or seek an in-person evaluation if the symptoms worsen or if the condition fails to improve as anticipated.  I provided 14 minutes of non-face-to-face time during this encounter.   [name], MD

## 2019-02-21 ENCOUNTER — Other Ambulatory Visit (INDEPENDENT_AMBULATORY_CARE_PROVIDER_SITE_OTHER): Payer: Medicare Other

## 2019-02-21 ENCOUNTER — Other Ambulatory Visit: Payer: Self-pay

## 2019-02-21 DIAGNOSIS — E785 Hyperlipidemia, unspecified: Secondary | ICD-10-CM | POA: Diagnosis not present

## 2019-02-21 DIAGNOSIS — R7303 Prediabetes: Secondary | ICD-10-CM

## 2019-02-21 LAB — COMPREHENSIVE METABOLIC PANEL
ALT: 34 U/L (ref 0–35)
AST: 39 U/L — ABNORMAL HIGH (ref 0–37)
Albumin: 4.9 g/dL (ref 3.5–5.2)
Alkaline Phosphatase: 45 U/L (ref 39–117)
BUN: 11 mg/dL (ref 6–23)
CO2: 29 mEq/L (ref 19–32)
Calcium: 10 mg/dL (ref 8.4–10.5)
Chloride: 98 mEq/L (ref 96–112)
Creatinine, Ser: 0.75 mg/dL (ref 0.40–1.20)
GFR: 74.01 mL/min (ref >60.00)
Glucose, Bld: 104 mg/dL — ABNORMAL HIGH (ref 70–99)
Potassium: 3.9 mEq/L (ref 3.5–5.1)
Sodium: 136 mEq/L (ref 135–145)
Total Bilirubin: 0.7 mg/dL (ref 0.2–1.2)
Total Protein: 7.6 g/dL (ref 6.0–8.3)

## 2019-02-21 LAB — LIPID PANEL
Cholesterol: 109 mg/dL (ref 0–200)
HDL: 45.4 mg/dL (ref >39.00)
NonHDL: 63.56
Total CHOL/HDL Ratio: 2
Triglycerides: 204 mg/dL — ABNORMAL HIGH (ref 0.0–149.0)
VLDL: 40.8 mg/dL — ABNORMAL HIGH (ref 0.0–40.0)

## 2019-02-21 LAB — HEMOGLOBIN A1C: Hgb A1c MFr Bld: 6.3 % (ref 4.6–6.5)

## 2019-02-21 LAB — LDL CHOLESTEROL, DIRECT: Direct LDL: 35 mg/dL

## 2019-02-23 ENCOUNTER — Telehealth: Payer: Self-pay

## 2019-02-23 NOTE — Telephone Encounter (Signed)

## 2019-02-24 ENCOUNTER — Other Ambulatory Visit: Payer: Self-pay

## 2019-02-24 ENCOUNTER — Other Ambulatory Visit: Payer: Self-pay | Admitting: Family Medicine

## 2019-02-24 ENCOUNTER — Ambulatory Visit (INDEPENDENT_AMBULATORY_CARE_PROVIDER_SITE_OTHER): Payer: Medicare Other | Admitting: Cardiovascular Disease

## 2019-02-24 VITALS — BP 124/66 | HR 68 | Ht 59.0 in | Wt 144.5 lb

## 2019-02-24 DIAGNOSIS — E785 Hyperlipidemia, unspecified: Secondary | ICD-10-CM

## 2019-02-24 DIAGNOSIS — Z9861 Coronary angioplasty status: Secondary | ICD-10-CM

## 2019-02-24 DIAGNOSIS — I10 Essential (primary) hypertension: Secondary | ICD-10-CM | POA: Diagnosis not present

## 2019-02-24 DIAGNOSIS — R7989 Other specified abnormal findings of blood chemistry: Secondary | ICD-10-CM

## 2019-02-24 DIAGNOSIS — I2510 Atherosclerotic heart disease of native coronary artery without angina pectoris: Secondary | ICD-10-CM | POA: Diagnosis not present

## 2019-02-24 DIAGNOSIS — I350 Nonrheumatic aortic (valve) stenosis: Secondary | ICD-10-CM

## 2019-02-24 DIAGNOSIS — R945 Abnormal results of liver function studies: Secondary | ICD-10-CM

## 2019-02-24 NOTE — Progress Notes (Signed)
Cardiology Office Note   Date:  02/24/2019   ID:  Amy Morton, Amy Morton 1934-09-07, MRN [id number]  PCP:  [name], MD  Cardiologist:   Kathlyn Sacramento, MD   Chief Complaint  Patient presents with  . Other    6 month follow up. Meds reviewed verbally with patient.       History of Present Illness: Amy Morton is a 84 y.o. female who presents for for a follow-up visit regarding coronary artery disease and moderate aortic stenosis. Other medical problems include anemia, hypertension, hyperlipidemia, anxiety and depression. She had previous left circumflex PCI in 2016 with known occluded distal right coronary artery. The patient was hospitalized in June 2019 at [facility] with unstable angina.  She underwent cardiac catheterization which showed patent left circumflex stents without significant restenosis, stable discrete 70% stenosis in the mid LAD and chronically occluded distal RCA with collaterals.  Aortic stenosis was moderate with a peak to peak gradient of 20 mmHg.  LVEDP was mildly elevated.  Overall, there was no significant change in coronary anatomy since 2016. Echocardiogram showed an EF of 60 to 65% with mild LVH moderate tricuspid regurgitation, severely dilated left atrium and moderate aortic stenosis.  She has been doing reasonably well with no recent chest pain, shortness of breath or palpitations.  She lives by herself as her husband died 44 years ago.  She only has 1 son alive.  She continues to be independent.  She drives and she lives by herself.   Past Medical History:  Diagnosis Date  . Abnormal mammogram 2007   Recommend close follow up  . Anemia    Iron, b12, SPEP, UPEP normal, 12/2012  . Anxiety   . Aortic stenosis    mild to moderate  . aortic stenosis   . Arthritis   . Asthma   . CAD (coronary artery disease)    s/p angioplasty 1996  . Chronic cough   . Depression   . Diverticulosis   . Dyspnea   . Elevated LFTs 2010   s/p  ultrasound w/ possible fatty liver; GI consult. Resolution 2011  . Gastric ulcer   . GERD (gastroesophageal reflux disease)   . History of colonic polyps    Dr. Vira Agar  . [facility] (hard of hearing)   . Hyperlipidemia   . Hypertension   . Peritoneal carcinomatosis [facility]) 2001   Dr. Oliva Bustard & Dr. Claiborne Rigg s/p chemo  . Shingles 2008   T-9 distribution  . Valvular heart disease    Mild to Moderate MR, AS  . Vitamin D deficiency   . Wheezing     Past Surgical History:  Procedure Laterality Date  . ABDOMINAL HYSTERECTOMY  2001  . APPENDECTOMY    . CARDIAC CATHETERIZATION  2007   50% mid LAD stenosis, 70% proximal RCA with 100% distal RCA stenosis with collaterals, EF 65%  . CARDIAC CATHETERIZATION N/A 04/27/2015   Procedure: Left Heart Cath and Coronary Angiography;  Surgeon: Jettie Booze, MD;  Location: [place] [facility];  Service: Cardiovascular;  Laterality: N/A;  . CARDIAC CATHETERIZATION N/A 04/27/2015   Procedure: Coronary Stent Intervention;  Surgeon: Jettie Booze, MD;  Location: [place] [facility];  Service: Cardiovascular;  Laterality: N/A;  . CATARACT EXTRACTION W/PHACO Left 09/25/2016   Procedure: CATARACT EXTRACTION PHACO AND INTRAOCULAR LENS PLACEMENT (IOC);  Surgeon: Eulogio Bear, MD;  Location: [facility] ORS;  Service: Ophthalmology;  Laterality: Left;  [place] 46.7 AP% 8.1 CDE 3.80 Fluid  pack lot # [id number]  . CATARACT EXTRACTION W/PHACO Right 11/06/2016   Procedure: CATARACT EXTRACTION PHACO AND INTRAOCULAR LENS PLACEMENT (IOC);  Surgeon: Eulogio Bear, MD;  Location: [facility] ORS;  Service: Ophthalmology;  Laterality: Right;  [place] 49.5 AP8.7 CDE4.28 LOT # [id number]  . CHOLECYSTECTOMY  1987  . COLECTOMY  2001  . CORONARY ANGIOPLASTY  1996  . CORONARY ANGIOPLASTY    . LAPAROSCOPIC BILATERAL SALPINGO OOPHERECTOMY  1988   Dr. Laverta Baltimore  . LEFT HEART CATH AND CORONARY ANGIOGRAPHY N/A 01/25/2018   Procedure: LEFT HEART CATH AND CORONARY ANGIOGRAPHY;  Surgeon: Wellington Hampshire, MD;  Location: [place] [facility];  Service: Cardiovascular;  Laterality: N/A;  . TONSILLECTOMY  1950     Current Outpatient Medications  Medication Sig Dispense Refill  . acetaminophen (TYLENOL) 650 MG CR tablet Take 1,000 mg by mouth 2 (two) times daily.     Marland Kitchen ADVAIR DISKUS 250-50 MCG/DOSE AEPB INHALE 1 PUFF INTO THE LUNGS TWICE DAILY 60 each 2  . albuterol (PROVENTIL HFA;VENTOLIN HFA) 108 (90 BASE) MCG/ACT inhaler Inhale 2 puffs into the lungs every 4 (four) hours as needed for wheezing or shortness of breath.     Marland Kitchen aspirin 81 MG tablet Take 81 mg by mouth daily.    Marland Kitchen atorvastatin (LIPITOR) 80 MG tablet Take 0.5 tablets (40 mg total) by mouth daily at 6 PM. 45 tablet 2  . calcium gluconate 650 MG tablet Take 650 mg by mouth 2 (two) times daily.    . Cholecalciferol 5000 units TABS Take 5,000 Units by mouth daily.    . citalopram (CELEXA) 20 MG tablet TAKE 1 TABLET(20 MG) BY MOUTH DAILY 90 tablet 2  . clopidogrel (PLAVIX) 75 MG tablet TAKE 1 TABLET BY MOUTH DAILY WITH BREAKFAST 90 tablet 2  . isosorbide mononitrate (IMDUR) 30 MG 24 hr tablet TAKE 1 TABLET(30 MG) BY MOUTH DAILY 90 tablet 0  . metoprolol (LOPRESSOR) 50 MG tablet TAKE 1 TABLET BY MOUTH TWICE DAILY 180 tablet 3  . mirtazapine (REMERON) 7.5 MG tablet TAKE 1 TABLET(7.5 MG) BY MOUTH AT BEDTIME 90 tablet 2  . Multiple Vitamin (MULTIVITAMIN) capsule Take 1 capsule by mouth daily.    . nitroGLYCERIN (NITROSTAT) 0.4 MG SL tablet PLACE 1 TABLET UNDER THE TONGUE EVERY 5 MINUTES AS NEEDED FOR CHEST PAIN, 3 DOSES MAX 75 tablet 3  . omega-3 acid ethyl esters (LOVAZA) 1 g capsule Take 2 capsules (2 g total) by mouth 2 (two) times daily. 360 capsule 1  . pantoprazole (PROTONIX) 40 MG tablet Take 1 tablet (40 mg total) by mouth daily. 90 tablet 2  . vitamin B-12 (CYANOCOBALAMIN) 250 MCG tablet Take 250 mcg by mouth daily.     No current facility-administered medications for this visit.    Facility-Administered Medications  Ordered in Other Visits  Medication Dose Route Frequency Provider Last Rate Last Dose  . sodium chloride 0.9 % nebulizer solution 3 mL  3 mL Nebulization Once [name], MD       Followed by  . methacholine (PROVOCHOLINE) inhaler solution 0.125 mg  2 mL Inhalation Once [name], MD       Followed by  . methacholine (PROVOCHOLINE) inhaler solution 0.5 mg  2 mL Inhalation Once [name], MD       Followed by  . methacholine (PROVOCHOLINE) inhaler solution 2 mg  2 mL Inhalation Once [name], MD       Followed by  . methacholine ([place])  inhaler solution 8 mg  2 mL Inhalation Once [name], MD       Followed by  . methacholine (PROVOCHOLINE) inhaler solution 32 mg  2 mL Inhalation Once [name], MD        Allergies:   Abilify [aripiprazole]    Social History:  The patient  reports that she has never smoked. She has never used smokeless tobacco. She reports that she does not drink alcohol or use drugs.   Family History:  The patient's family history includes Cancer in her father; Cancer (age of onset: 21) in her daughter; Diabetes in her brother and sister; Heart disease in her brother; Hypertension in her mother; Stroke (age of onset: 42) in her mother.    ROS:  Please see the history of present illness.   Otherwise, review of systems are positive for none.   All other systems are reviewed and negative.    PHYSICAL EXAM: VS:  BP 124/66 (BP Location: Left Arm, Patient Position: Sitting, Cuff Size: Normal)   Pulse 68   Ht 4\' 11"  (1.499 m)   Wt 144 lb 8 oz (65.5 kg)   BMI 29.19 kg/m  , BMI Body mass index is 29.19 kg/m. GEN: Well nourished, well developed, in no acute distress  HEENT: normal  Neck: no JVD, carotid bruits, or masses Cardiac: RRR; no rubs, or gallops,no edema .  2/6 crescendo decrescendo systolic murmur in the aortic area which is mid peaking. Respiratory:  clear to auscultation bilaterally, normal work of  breathing GI: soft, nontender, nondistended, + BS MS: no deformity or atrophy  Skin: warm and dry, no rash Neuro:  Strength and sensation are intact Psych: euthymic mood, full affect   EKG:  EKG is ordered today. The ekg ordered today demonstrates normal sinus rhythm with no significant ST or T wave changes.   Recent Labs: 02/21/2019: ALT 34; BUN 11; Creatinine, Ser 0.75; Potassium 3.9; Sodium 136    Lipid Panel    Component Value Date/Time   CHOL 109 02/21/2019 1012   CHOL 115 09/03/2017 1049   TRIG 204.0 (H) 02/21/2019 1012   HDL 45.40 02/21/2019 1012   HDL 50 09/03/2017 1049   CHOLHDL 2 02/21/2019 1012   VLDL 40.8 (H) 02/21/2019 1012   LDLCALC 20 09/13/2018 0906   LDLCALC 29 09/03/2017 1049   LDLDIRECT 35.0 02/21/2019 1012      Wt Readings from Last 3 Encounters:  02/24/19 144 lb 8 oz (65.5 kg)  08/13/18 145 lb 6 oz (65.9 kg)  05/07/18 148 lb 12 oz (67.5 kg)       No flowsheet data found.    ASSESSMENT AND PLAN:  1.  Coronary artery disease involving native coronary arteries without angina: Overall she is doing well.  I recommend continuing medical therapy.  2.  Moderate aortic stenosis: I requested a follow-up echocardiogram.  3.  Essential hypertension: Blood pressure is reasonably controlled on current medications.  4.  Hyperlipidemia: Continue treatment with high-dose atorvastatin.  Most recent LDL was 29.     Disposition:   FU with me in 6 months  Signed,  Kathlyn Sacramento, MD  02/24/2019 11:14 AM    [place]

## 2019-02-24 NOTE — Patient Instructions (Signed)
Medication Instructions:  Your physician recommends that you continue on your current medications as directed. Please refer to the Current Medication list given to you today.  If you need a refill on your cardiac medications before your next appointment, please call your pharmacy.   Lab work: None orderd If you have labs (blood work) drawn today and your tests are completely normal, you will receive your results only by: Marland Kitchen MyChart Message (if you have MyChart) OR . A paper copy in the mail If you have any lab test that is abnormal or we need to change your treatment, we will call you to review the results.  Testing/Procedures: Your physician has requested that you have an echocardiogram. Echocardiography is a painless test that uses sound waves to create images of your heart. It provides your doctor with information about the size and shape of your heart and how well your heart's chambers and valves are working. This procedure takes approximately one hour. There are no restrictions for this procedure.    Follow-Up: At [facility], you and your health needs are our priority.  As part of our continuing mission to provide you with exceptional heart care, we have created designated Provider Care Teams.  These Care Teams include your primary Cardiologist (physician) and Advanced Practice Providers (APPs -  Physician Assistants and Nurse Practitioners) who all work together to provide you with the care you need, when you need it. You will need a follow up appointment in 6 months.  Please call our office 2 months in advance to schedule this appointment.  You may see  Dr.Arida  or one of the following Advanced Practice Providers on your designated Care Team:   Murray Hodgkins, NP Christell Faith, PA-C . Marrianne Mood, PA-C

## 2019-02-28 ENCOUNTER — Other Ambulatory Visit: Payer: Self-pay

## 2019-02-28 MED ORDER — METOPROLOL TARTRATE 50 MG PO TABS: 50.0000 mg | ORAL_TABLET | Freq: Two times a day (BID) | ORAL | 3 refills | Status: DC

## 2019-03-08 ENCOUNTER — Ambulatory Visit (INDEPENDENT_AMBULATORY_CARE_PROVIDER_SITE_OTHER): Payer: Medicare Other | Admitting: Family Medicine

## 2019-03-08 ENCOUNTER — Encounter: Payer: Self-pay | Admitting: Family Medicine

## 2019-03-08 ENCOUNTER — Ambulatory Visit (INDEPENDENT_AMBULATORY_CARE_PROVIDER_SITE_OTHER): Payer: Medicare Other

## 2019-03-08 ENCOUNTER — Other Ambulatory Visit: Payer: Self-pay

## 2019-03-08 DIAGNOSIS — R195 Other fecal abnormalities: Secondary | ICD-10-CM | POA: Diagnosis not present

## 2019-03-08 DIAGNOSIS — R7401 Elevation of levels of liver transaminase levels: Secondary | ICD-10-CM

## 2019-03-08 DIAGNOSIS — Z0000 Encounter for general adult medical examination without abnormal findings: Secondary | ICD-10-CM

## 2019-03-08 DIAGNOSIS — R740 Nonspecific elevation of levels of transaminase and lactic acid dehydrogenase [LDH]: Secondary | ICD-10-CM

## 2019-03-08 DIAGNOSIS — R7303 Prediabetes: Secondary | ICD-10-CM | POA: Diagnosis not present

## 2019-03-08 NOTE — Progress Notes (Signed)
Virtual Visit via telephone Note  This visit type was conducted due to national recommendations for restrictions regarding the COVID-19 pandemic (e.g. social distancing).  This format is felt to be most appropriate for this patient at this time.  All issues noted in this document were discussed and addressed.  No physical exam was performed (except for noted visual exam findings with Video Visits).   I connected with Amy Morton today at  9:30 AM EDT by telephone and verified that I am speaking with the correct person using two identifiers. Location patient: home Location provider: work Persons participating in the virtual visit: patient, provider  I discussed the limitations, risks, security and privacy concerns of performing an evaluation and management service by telephone and the availability of in person appointments. I also discussed with the patient that there may be a patient responsible charge related to this service. The patient expressed understanding and agreed to proceed.  Interactive audio and video telecommunications were attempted between this provider and patient, however failed, due to patient having technical difficulties OR patient did not have access to video capability.  We continued and completed visit with audio only.  Reason for visit: follow-up  HPI: Elevated AST: no alcohol intake. Takes tylenol 1000 mg twice daily for the arthritis in her hands.  She notes her pharmacist said this was not a good medicine for her.  No right upper quadrant pain.  Prediabetes: Patient has been exercising by walking 45 to 60 minutes 3-4 times a week.  She is eating mostly vegetables.  She is not eating very much meat and only eats beans once weekly.  Not a whole lot of protein intake.  Bowel issues: Patient reports intermittent loose stools for the last 3 to 4 months.  She will sometimes go to the bathroom up to 15 times daily.  It is not watery stool.  There is no nausea or  vomiting.  No constipation.  No blood in her stool.  No melena.  No abnormal smell.  No abdominal pain.  No fevers.  She does eat a lot of plant matter and eats a lot of salads.  She has not been able to identify any dietary aspect that is contributing.  She has had this previously though not this frequently.  She notes no new medications.  No new supplements.   ROS: See pertinent positives and negatives per HPI.  Past Medical History:  Diagnosis Date   Abnormal mammogram 2007   Recommend close follow up   Anemia    Iron, b12, SPEP, UPEP normal, 12/2012   Anxiety    Aortic stenosis    mild to moderate   aortic stenosis    Arthritis    Asthma    CAD (coronary artery disease)    s/p angioplasty 1996   Chronic cough    Depression    Diverticulosis    Dyspnea    Elevated LFTs 2010   s/p ultrasound w/ possible fatty liver; GI consult. Resolution 2011   Gastric ulcer    GERD (gastroesophageal reflux disease)    History of colonic polyps    Dr. Vira Agar   [facility] (hard of hearing)    Hyperlipidemia    Hypertension    Peritoneal carcinomatosis [facility]) 2001   Dr. Oliva Bustard & Dr. Claiborne Rigg s/p chemo   Shingles 2008   T-9 distribution   Valvular heart disease    Mild to Moderate MR, AS   Vitamin D deficiency    Wheezing  Past Surgical History:  Procedure Laterality Date   ABDOMINAL HYSTERECTOMY  2001   APPENDECTOMY     CARDIAC CATHETERIZATION  2007   50% mid LAD stenosis, 70% proximal RCA with 100% distal RCA stenosis with collaterals, EF 65%   CARDIAC CATHETERIZATION N/A 04/27/2015   Procedure: Left Heart Cath and Coronary Angiography;  Surgeon: Jettie Booze, MD;  Location: [place] [facility];  Service: Cardiovascular;  Laterality: N/A;   CARDIAC CATHETERIZATION N/A 04/27/2015   Procedure: Coronary Stent Intervention;  Surgeon: Jettie Booze, MD;  Location: [place] [facility];  Service: Cardiovascular;  Laterality: N/A;   CATARACT  EXTRACTION W/PHACO Left 09/25/2016   Procedure: CATARACT EXTRACTION PHACO AND INTRAOCULAR LENS PLACEMENT (IOC);  Surgeon: Eulogio Bear, MD;  Location: [facility] ORS;  Service: Ophthalmology;  Laterality: Left;  [place] 46.7 AP% 8.1 CDE 3.80 Fluid pack lot # [id number]   CATARACT EXTRACTION W/PHACO Right 11/06/2016   Procedure: CATARACT EXTRACTION PHACO AND INTRAOCULAR LENS PLACEMENT (IOC);  Surgeon: Eulogio Bear, MD;  Location: [facility] ORS;  Service: Ophthalmology;  Laterality: Right;  [place] 49.5 AP8.7 CDE4.28 LOT # [id number]   CHOLECYSTECTOMY  1987   COLECTOMY  2001   CORONARY ANGIOPLASTY  1996   CORONARY ANGIOPLASTY     LAPAROSCOPIC BILATERAL SALPINGO OOPHERECTOMY  1988   Dr. [place] CATH AND CORONARY ANGIOGRAPHY N/A 01/25/2018   Procedure: LEFT HEART CATH AND CORONARY ANGIOGRAPHY;  Surgeon: Wellington Hampshire, MD;  Location: [place] [facility];  Service: Cardiovascular;  Laterality: N/A;   TONSILLECTOMY  1950    Family History  Problem Relation Age of Onset   Hypertension Mother    Stroke Mother 78       Cerebral Hemorrhage   Cancer Father        bone cancer   Diabetes Sister    Diabetes Brother    Heart disease Brother    Cancer Daughter 16       Ovarian cancer   Colon cancer Neg Hx    Colon polyps Neg Hx     SOCIAL HX: Non-smoker.   Current Outpatient Medications:    acetaminophen (TYLENOL) 650 MG CR tablet, Take 1,000 mg by mouth 2 (two) times daily. , Disp: , Rfl:    ADVAIR DISKUS 250-50 MCG/DOSE AEPB, INHALE 1 PUFF INTO THE LUNGS TWICE DAILY, Disp: 60 each, Rfl: 2   albuterol (PROVENTIL HFA;VENTOLIN HFA) 108 (90 BASE) MCG/ACT inhaler, Inhale 2 puffs into the lungs every 4 (four) hours as needed for wheezing or shortness of breath. , Disp: , Rfl:    aspirin 81 MG tablet, Take 81 mg by mouth daily., Disp: , Rfl:    atorvastatin (LIPITOR) 80 MG tablet, Take 0.5 tablets (40 mg total) by mouth daily at 6 PM., Disp: 45 tablet, Rfl: 2   calcium  gluconate 650 MG tablet, Take 650 mg by mouth 2 (two) times daily., Disp: , Rfl:    Cholecalciferol 5000 units TABS, Take 5,000 Units by mouth daily., Disp: , Rfl:    citalopram (CELEXA) 20 MG tablet, TAKE 1 TABLET(20 MG) BY MOUTH DAILY, Disp: 90 tablet, Rfl: 2   clopidogrel (PLAVIX) 75 MG tablet, TAKE 1 TABLET BY MOUTH DAILY WITH BREAKFAST, Disp: 90 tablet, Rfl: 2   isosorbide mononitrate (IMDUR) 30 MG 24 hr tablet, TAKE 1 TABLET(30 MG) BY MOUTH DAILY, Disp: 90 tablet, Rfl: 0   metoprolol tartrate (LOPRESSOR) 50 MG tablet, Take 1 tablet (50 mg total) by mouth 2 (two) times  daily., Disp: 180 tablet, Rfl: 3   mirtazapine (REMERON) 7.5 MG tablet, TAKE 1 TABLET(7.5 MG) BY MOUTH AT BEDTIME, Disp: 90 tablet, Rfl: 2   Multiple Vitamin (MULTIVITAMIN) capsule, Take 1 capsule by mouth daily., Disp: , Rfl:    nitroGLYCERIN (NITROSTAT) 0.4 MG SL tablet, PLACE 1 TABLET UNDER THE TONGUE EVERY 5 MINUTES AS NEEDED FOR CHEST PAIN, 3 DOSES MAX, Disp: 75 tablet, Rfl: 3   omega-3 acid ethyl esters (LOVAZA) 1 g capsule, Take 2 capsules (2 g total) by mouth 2 (two) times daily., Disp: 360 capsule, Rfl: 1   pantoprazole (PROTONIX) 40 MG tablet, Take 1 tablet (40 mg total) by mouth daily., Disp: 90 tablet, Rfl: 2   vitamin B-12 (CYANOCOBALAMIN) 250 MCG tablet, Take 250 mcg by mouth daily., Disp: , Rfl:  No current facility-administered medications for this visit.   Facility-Administered Medications Ordered in Other Visits:    sodium chloride 0.9 % nebulizer solution 3 mL, 3 mL, Nebulization, Once **FOLLOWED BY** methacholine (PROVOCHOLINE) inhaler solution 0.125 mg, 2 mL, Inhalation, Once **FOLLOWED BY** methacholine (PROVOCHOLINE) inhaler solution 0.5 mg, 2 mL, Inhalation, Once **FOLLOWED BY** methacholine (PROVOCHOLINE) inhaler solution 2 mg, 2 mL, Inhalation, Once **FOLLOWED BY** methacholine (PROVOCHOLINE) inhaler solution 8 mg, 2 mL, Inhalation, Once **FOLLOWED BY** methacholine (PROVOCHOLINE) inhaler  solution 32 mg, 2 mL, Inhalation, Once **FOLLOWED BY** [COMPLETED] albuterol (PROVENTIL) (2.5 MG/3ML) 0.083% nebulizer solution 2.5 mg, 2.5 mg, Nebulization, Once, [name], MD, 2.5 mg at 08/27/17 0908  EXAM: This is a telehealth telephone visit notes no physical exam was completed.  ASSESSMENT AND PLAN:  Discussed the following assessment and plan:  Prediabetes Continue diet and exercise.  Elevated AST (SGOT) Mild elevation.  Will recheck with lab work.  Loose stools Intermittent issues with this for several months.  Possibly diet related with her large intake of plant matter.  Discussed decreasing her leafy greens and increasing adequate other vegetables.  We will have her go to the lab at the hospital to complete stool studies to rule out underlying infectious loose stools though I have low suspicion for this given that she has intermittent normal stools.   Social distancing precautions and sick precautions given regarding COVID-19.   I discussed the assessment and treatment plan with the patient. The patient was provided an opportunity to ask questions and all were answered. The patient agreed with the plan and demonstrated an understanding of the instructions.   The patient was advised to call back or seek an in-person evaluation if the symptoms worsen or if the condition fails to improve as anticipated.  I provided 21 minutes of non-face-to-face time during this encounter.   [name], MD

## 2019-03-08 NOTE — Progress Notes (Signed)
Subjective:   Amy Morton is a 84 y.o. female who presents for Medicare Annual (Subsequent) preventive examination.  Review of Systems:  No ROS.  Medicare Wellness Virtual Visit.  Visual/audio telehealth visit, UTA vital signs.   See social history for additional risk factors.   Cardiac Risk Factors include: advanced age (>32men, >61 women);hypertension     Objective:     Vitals: There were no vitals taken for this visit.  There is no height or weight on file to calculate BMI.  Advanced Directives 03/08/2019 03/05/2018 01/22/2018 01/22/2018 03/04/2017 11/06/2016 03/04/2016  Does Patient Have a Medical Advance Directive? Yes Yes Yes No Yes Yes Yes  Type of Paramedic of [place];Living will [place];Living will Living will - [place];Living will [place];Living will [place];Living will  Does patient want to make changes to medical advance directive? No - Patient declined No - Patient declined No - Patient declined - No - Patient declined - -  Copy of [place] in Chart? Yes - validated most recent copy scanned in chart (See row information) Yes - - No - copy requested No - copy requested No - copy requested    Tobacco Social History   Tobacco Use  Smoking Status Never Smoker  Smokeless Tobacco Never Used     Counseling given: Not Answered   Clinical Intake:  Pre-visit preparation completed: Yes        Diabetes: No  How often do you need to have someone help you when you read instructions, pamphlets, or other written materials from your doctor or pharmacy?: 1 - Never  Interpreter Needed?: No     Past Medical History:  Diagnosis Date  . Abnormal mammogram 2007   Recommend close follow up  . Anemia    Iron, b12, SPEP, UPEP normal, 12/2012  . Anxiety   . Aortic stenosis    mild to moderate  . aortic stenosis   . Arthritis   . Asthma   .  CAD (coronary artery disease)    s/p angioplasty 1996  . Chronic cough   . Depression   . Diverticulosis   . Dyspnea   . Elevated LFTs 2010   s/p ultrasound w/ possible fatty liver; GI consult. Resolution 2011  . Gastric ulcer   . GERD (gastroesophageal reflux disease)   . History of colonic polyps    Dr. Vira Agar  . HOH (hard of hearing)   . Hyperlipidemia   . Hypertension   . Peritoneal carcinomatosis [facility]) 2001   Dr. Oliva Bustard & Dr. Claiborne Rigg s/p chemo  . Shingles 2008   T-9 distribution  . Valvular heart disease    Mild to Moderate MR, AS  . Vitamin D deficiency   . Wheezing    Past Surgical History:  Procedure Laterality Date  . ABDOMINAL HYSTERECTOMY  2001  . APPENDECTOMY    . CARDIAC CATHETERIZATION  2007   50% mid LAD stenosis, 70% proximal RCA with 100% distal RCA stenosis with collaterals, EF 65%  . CARDIAC CATHETERIZATION N/A 04/27/2015   Procedure: Left Heart Cath and Coronary Angiography;  Surgeon: Jettie Booze, MD;  Location: [place] [facility];  Service: Cardiovascular;  Laterality: N/A;  . CARDIAC CATHETERIZATION N/A 04/27/2015   Procedure: Coronary Stent Intervention;  Surgeon: Jettie Booze, MD;  Location: [place] [facility];  Service: Cardiovascular;  Laterality: N/A;  . CATARACT EXTRACTION W/PHACO Left 09/25/2016   Procedure:  CATARACT EXTRACTION PHACO AND INTRAOCULAR LENS PLACEMENT (IOC);  Surgeon: Eulogio Bear, MD;  Location: [facility];  Service: Ophthalmology;  Laterality: Left;  [place] 46.7 AP% 8.1 CDE 3.80 Fluid pack lot # [id number]  . CATARACT EXTRACTION W/PHACO Right 11/06/2016   Procedure: CATARACT EXTRACTION PHACO AND INTRAOCULAR LENS PLACEMENT ([place]);  Surgeon: Eulogio Bear, MD;  Location: [facility];  Service: Ophthalmology;  Laterality: Right;  [place] 49.5 AP8.7 CDE4.28 LOT # [id number]  . CHOLECYSTECTOMY  1987  . COLECTOMY  2001  . CORONARY ANGIOPLASTY  1996  . CORONARY ANGIOPLASTY    . LAPAROSCOPIC BILATERAL SALPINGO OOPHERECTOMY   1988   Dr. Laverta [place]  . LEFT HEART CATH AND CORONARY ANGIOGRAPHY N/A 01/25/2018   Procedure: LEFT HEART CATH AND CORONARY ANGIOGRAPHY;  Surgeon: Wellington Hampshire, MD;  Location: [place] [facility];  Service: Cardiovascular;  Laterality: N/A;  . TONSILLECTOMY  1950   Family History  Problem Relation Age of Onset  . Hypertension Mother   . Stroke Mother 86       Cerebral Hemorrhage  . Cancer Father        bone cancer  . Diabetes Sister   . Diabetes Brother   . Heart disease Brother   . Cancer Daughter 49       Ovarian cancer  . Colon cancer Neg Hx   . Colon polyps Neg Hx    Social History   Socioeconomic History  . Marital status: Widowed    Spouse name: Not on file  . Number of children: 3  . Years of education: Not on file  . Highest education level: Not on file  Occupational History  . Occupation: [employer]    Comment: Retired  . 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 Financial resource strain: Not hard at all  . Food insecurity    Worry: Never true    Inability: Never true  . Transportation needs    Medical: No    Non-medical: No  Tobacco Use  . Smoking status: Never Smoker  . Smokeless tobacco: Never Used  Substance and Sexual Activity  . Alcohol use: No    Alcohol/week: 0.0 standard drinks  . Drug use: No  . Sexual activity: Never  Lifestyle  . Physical activity    Days per week: 3 days    Minutes per session: 60 min  . Stress: Not at all  Relationships  . Social Herbalist on phone: Not on file    Gets together: Not on file    Attends religious service: Not on file    Active member of club or organization: Not on file    Attends meetings of clubs or organizations: Not on file    Relationship status: Not on file  Other Topics Concern  . Not on file  Social History Narrative   Pt lives in [place] by herself. She is widowed and has a daughter [name]), son [name]). She also had a son that was killed in  a work related accident [name] - died age).       Caffeine - none   Exercise - walking    Outpatient Encounter Medications as of 03/08/2019  Medication Sig  . acetaminophen (TYLENOL) 650 MG CR tablet Take 1,000 mg by mouth 2 (two) times daily.   Marland Kitchen ADVAIR DISKUS 250-50 MCG/DOSE AEPB INHALE 1 PUFF INTO THE LUNGS TWICE DAILY  . albuterol (PROVENTIL HFA;VENTOLIN HFA) 108 (90 BASE)  MCG/ACT inhaler Inhale 2 puffs into the lungs every 4 (four) hours as needed for wheezing or shortness of breath.   Marland Kitchen aspirin 81 MG tablet Take 81 mg by mouth daily.  Marland Kitchen atorvastatin (LIPITOR) 80 MG tablet Take 0.5 tablets (40 mg total) by mouth daily at 6 PM.  . calcium gluconate 650 MG tablet Take 650 mg by mouth 2 (two) times daily.  . Cholecalciferol 5000 units TABS Take 5,000 Units by mouth daily.  . citalopram (CELEXA) 20 MG tablet TAKE 1 TABLET(20 MG) BY MOUTH DAILY  . clopidogrel (PLAVIX) 75 MG tablet TAKE 1 TABLET BY MOUTH DAILY WITH BREAKFAST  . isosorbide mononitrate (IMDUR) 30 MG 24 hr tablet TAKE 1 TABLET(30 MG) BY MOUTH DAILY  . metoprolol tartrate (LOPRESSOR) 50 MG tablet Take 1 tablet (50 mg total) by mouth 2 (two) times daily.  . mirtazapine (REMERON) 7.5 MG tablet TAKE 1 TABLET(7.5 MG) BY MOUTH AT BEDTIME  . Multiple Vitamin (MULTIVITAMIN) capsule Take 1 capsule by mouth daily.  . nitroGLYCERIN (NITROSTAT) 0.4 MG SL tablet PLACE 1 TABLET UNDER THE TONGUE EVERY 5 MINUTES AS NEEDED FOR CHEST PAIN, 3 DOSES MAX  . omega-3 acid ethyl esters (LOVAZA) 1 g capsule Take 2 capsules (2 g total) by mouth 2 (two) times daily.  . pantoprazole (PROTONIX) 40 MG tablet Take 1 tablet (40 mg total) by mouth daily.  . vitamin B-12 (CYANOCOBALAMIN) 250 MCG tablet Take 250 mcg by mouth daily.   Facility-Administered Encounter Medications as of 03/08/2019  Medication  . sodium chloride 0.9 % nebulizer solution 3 mL   Followed by  . methacholine (PROVOCHOLINE) inhaler solution 0.125 mg   Followed by  . methacholine  (PROVOCHOLINE) inhaler solution 0.5 mg   Followed by  . methacholine (PROVOCHOLINE) inhaler solution 2 mg   Followed by  . methacholine (PROVOCHOLINE) inhaler solution 8 mg   Followed by  . methacholine (PROVOCHOLINE) inhaler solution 32 mg    Activities of Daily Living In your present state of health, do you have any difficulty performing the following activities: 03/08/2019  Hearing? N  Vision? N  Difficulty concentrating or making decisions? N  Walking or climbing stairs? N  Dressing or bathing? N  Doing errands, shopping? N  Preparing Food and eating ? N  Using the Toilet? N  In the past six months, have you accidently leaked urine? N  Do you have problems with loss of bowel control? N  Managing your Medications? N  Managing your Finances? N  Housekeeping or managing your Housekeeping? N  Some recent data might be hidden    Patient Care Team: [name], MD as PCP - General (Family Medicine) Nahser, Wonda Cheng, MD as PCP - Cardiology (Cardiology) Sherryl Barters, NP as Nurse Practitioner (Nurse Practitioner)    Assessment:   This is a routine wellness examination for [place].  I connected with patient 03/08/19 at  9:00 AM EDT by an audio enabled telemedicine application and verified that I am speaking with the correct person using two identifiers. Patient stated full name and DOB. Patient gave permission to continue with virtual visit. Patient's location was at home and Nurse's location was at [place] office.   Health Screenings  Mammogram 3D -09/2014 Colonoscopy - 07/2011 Bone Density - 02/2016 Glaucoma -none Hearing -demonstrates normal hearing during visit. Hemoglobin A1C - 01/2019 (6.3) Cholesterol - 01/2019 Dental- UTD Vision- visits within the last 12 months  Social  Alcohol intake - no     Smoking history- never  Smokers in home? none Illicit drug use? none Exercise - walking 3 days a week, 60 minutes Diet - low carb Sexually Active -never BMI-  discussed the importance of a healthy diet, water intake and the benefits of aerobic exercise.  Educational material provided.   Safety  Patient feels safe at home- yes Patient does have smoke detectors at home- yes Patient does wear sunscreen or protective clothing when in direct sunlight -yes Patient does wear seat belt when in a moving vehicle -yes Patient drives- yes  [id number] precautions and sickness symptoms discussed.   Activities of Daily Living Patient denies needing assistance with: driving, household chores, feeding themselves, getting from bed to chair, getting to the toilet, bathing/showering, dressing, managing money, or preparing meals.   Depression Screen Patient denies losing interest in daily life, feeling hopeless, or crying easily over simple problems.   Medication-taking as directed and without issues.   Memory Screen Patient is alert.  Correctly identified the president of the [place], season and recall 3/3. Patient likes to work puzzles for brain stimulation.  Immunizations The following Immunizations were discussed: Influenza, shingles, pneumonia, and tetanus.   Other Providers Patient Care Team: [name], MD as PCP - General (Family Medicine) Nahser, Wonda Cheng, MD as PCP - Cardiology (Cardiology) Sherryl Barters, NP as Nurse Practitioner (Nurse Practitioner)   Exercise Activities and Dietary recommendations Current Exercise Habits: Home exercise routine, Type of exercise: walking, Time (Minutes): 60, Frequency (Times/Week): 3, Weekly Exercise (Minutes/Week): 180, Intensity: Mild  Goals    . Follow up with Primary Care Provider     As needed       Fall Risk Fall Risk  03/08/2019 02/11/2019 03/05/2018 03/04/2017 04/02/2016  Falls in the past year? 0 0 No No Yes  Comment - - - - Emmi Telephone Survey: data to providers prior to load  Number falls in past yr: - 0 - - 1  Comment - - - - Emmi Telephone Survey Actual Response = 1  Injury with Fall? - 0 -  - Yes  Follow up - Falls evaluation completed - - -  Is the patient's home free of loose throw rugs in walkways, pet beds, electrical cords, etc? yes      Grab bars in the bathroom? yes      Handrails on the stairs? yes       Adequate lighting? yes  Depression Screen PHQ 2/9 Scores 03/08/2019 02/11/2019 03/05/2018 02/08/2018  PHQ - 2 Score 0 0 0 0  PHQ- 9 Score - 0 - -     Cognitive Function MMSE - Mini Mental State Exam 03/05/2018 03/04/2017 03/04/2016  Orientation to time 5 5 5   Orientation to Place 5 5 5   Registration 3 3 3   Attention/ Calculation 5 5 5   Recall 1 3 2   Recall-comments 1 out of 3 words recalled - -  Language- name 2 objects 2 2 2   Language- repeat 1 1 1   Language- follow 3 step command 3 3 3   Language- read & follow direction 1 1 1   Write a sentence 1 1 1   Copy design 1 1 1   Total score 28 30 29      6CIT Screen 03/08/2019  What Year? 0 points  What month? 0 points  What time? 0 points  Count back from 20 0 points  Months in reverse 0 points  Repeat phrase 0 points  Total Score 0    Immunization History  Administered Date(s) Administered  . DTaP 06/23/2011  .  Influenza,inj,Quad PF,6+ Mos 04/17/2014, 05/07/2015, 05/07/2018  . Influenza-Unspecified 04/13/2012, 04/08/2013, 04/17/2014, 05/07/2015, 05/02/2016  . Pneumococcal Conjugate-13 03/04/2016  . Pneumococcal Polysaccharide-23 11/20/2005, 11/21/2010  . Zoster 01/21/2011   Screening Tests Health Maintenance  Topic Date Due  . INFLUENZA VACCINE  02/26/2019  . TETANUS/TDAP  03/13/2026  . DEXA SCAN  Completed  . PNA vac Low Risk Adult  Completed       Plan:    End of life planning; Advance aging; Advanced directives discussed.  Copy of current HCPOA/Living Will on file.    I have personally reviewed and noted the following in the patient's chart:   . Medical and social history . Use of alcohol, tobacco or illicit drugs  . Current medications and supplements . Functional ability and status .  Nutritional status . Physical activity . Advanced directives . List of other physicians . Hospitalizations, surgeries, and ER visits in previous 12 months . Vitals . Screenings to include cognitive, depression, and falls . Referrals and appointments  In addition, I have reviewed and discussed with patient certain preventive protocols, quality metrics, and best practice recommendations. A written personalized care plan for preventive services as well as general preventive health recommendations were provided to patient.     Varney Biles, LPN  [id number]

## 2019-03-08 NOTE — Assessment & Plan Note (Signed)
Intermittent issues with this for several months.  Possibly diet related with her large intake of plant matter.  Discussed decreasing her leafy greens and increasing adequate other vegetables.  We will have her go to the lab at the hospital to complete stool studies to rule out underlying infectious loose stools though I have low suspicion for this given that she has intermittent normal stools.

## 2019-03-08 NOTE — Patient Instructions (Addendum)
  Amy Morton , Thank you for taking time to come for your Medicare Wellness Visit. I appreciate your ongoing commitment to your health goals. Please review the following plan we discussed and let me know if I can assist you in the future.   These are the goals we discussed: Goals    . Follow up with Primary Care Provider     As needed       This is a list of the screening recommended for you and due dates:  Health Maintenance  Topic Date Due  . Flu Shot  02/26/2019  . Tetanus Vaccine  03/13/2026  . DEXA scan (bone density measurement)  Completed  . Pneumonia vaccines  Completed

## 2019-03-08 NOTE — Assessment & Plan Note (Signed)
Continue diet and exercise. 

## 2019-03-08 NOTE — Assessment & Plan Note (Signed)
Mild elevation.  Will recheck with lab work.

## 2019-03-14 ENCOUNTER — Other Ambulatory Visit: Payer: Self-pay | Admitting: Family Medicine

## 2019-03-14 ENCOUNTER — Other Ambulatory Visit
Admission: RE | Admit: 2019-03-14 | Discharge: 2019-03-14 | Disposition: A | Discharge status: Home / Self Care | Payer: Medicare Other | Attending: Family Medicine | Admitting: Family Medicine

## 2019-03-14 DIAGNOSIS — R7989 Other specified abnormal findings of blood chemistry: Secondary | ICD-10-CM

## 2019-03-14 DIAGNOSIS — R195 Other fecal abnormalities: Secondary | ICD-10-CM | POA: Insufficient documentation

## 2019-03-14 DIAGNOSIS — R945 Abnormal results of liver function studies: Secondary | ICD-10-CM

## 2019-03-14 LAB — GASTROINTESTINAL PANEL BY PCR, STOOL (REPLACES STOOL CULTURE)

## 2019-03-14 LAB — COMPREHENSIVE METABOLIC PANEL
ALT: 39 U/L (ref 0–44)
AST: 53 U/L — ABNORMAL HIGH (ref 15–41)
Albumin: 4.2 g/dL (ref 3.5–5.0)
Alkaline Phosphatase: 42 U/L (ref 38–126)
Anion gap: 10 (ref 5–15)
BUN: 17 mg/dL (ref 8–23)
CO2: 23 mmol/L (ref 22–32)
Calcium: 9.4 mg/dL (ref 8.9–10.3)
Chloride: 102 mmol/L (ref 98–111)
Creatinine, Ser: 0.69 mg/dL (ref 0.44–1.00)
GFR calc Af Amer: >60 mL/min (ref >60)
GFR calc non Af Amer: >60 mL/min (ref >60)
Glucose, Bld: 110 mg/dL — ABNORMAL HIGH (ref 70–99)
Potassium: 4 mmol/L (ref 3.5–5.1)
Sodium: 135 mmol/L (ref 135–145)
Total Bilirubin: 0.7 mg/dL (ref 0.3–1.2)
Total Protein: 7.8 g/dL (ref 6.5–8.1)

## 2019-03-14 LAB — MAGNESIUM: Magnesium: 1.5 mg/dL — ABNORMAL LOW (ref 1.7–2.4)

## 2019-03-14 NOTE — Addendum Note (Signed)
Addended by: Santiago Bur on: 03/14/2019 12:30 PM   Modules accepted: Orders

## 2019-03-14 NOTE — Addendum Note (Signed)
Addended by: Santiago Bur on: 03/14/2019 12:32 PM   Modules accepted: Orders

## 2019-03-16 ENCOUNTER — Ambulatory Visit (INDEPENDENT_AMBULATORY_CARE_PROVIDER_SITE_OTHER): Payer: Medicare Other

## 2019-03-16 ENCOUNTER — Other Ambulatory Visit: Payer: Self-pay

## 2019-03-16 DIAGNOSIS — I350 Nonrheumatic aortic (valve) stenosis: Secondary | ICD-10-CM

## 2019-03-16 MED ORDER — PERFLUTREN LIPID MICROSPHERE
1.0000 mL | INTRAVENOUS | Status: AC | PRN
  Administered 2019-03-16: 2 mL via INTRAVENOUS

## 2019-03-17 ENCOUNTER — Other Ambulatory Visit: Payer: Self-pay | Admitting: Cardiovascular Disease

## 2019-03-31 ENCOUNTER — Other Ambulatory Visit (INDEPENDENT_AMBULATORY_CARE_PROVIDER_SITE_OTHER): Payer: Medicare Other

## 2019-03-31 ENCOUNTER — Other Ambulatory Visit: Payer: Self-pay

## 2019-03-31 DIAGNOSIS — R945 Abnormal results of liver function studies: Secondary | ICD-10-CM

## 2019-03-31 DIAGNOSIS — R195 Other fecal abnormalities: Secondary | ICD-10-CM

## 2019-03-31 DIAGNOSIS — R7989 Other specified abnormal findings of blood chemistry: Secondary | ICD-10-CM

## 2019-03-31 LAB — COMPREHENSIVE METABOLIC PANEL
ALT: 32 U/L (ref 0–35)
AST: 33 U/L (ref 0–37)
Albumin: 4.6 g/dL (ref 3.5–5.2)
Alkaline Phosphatase: 43 U/L (ref 39–117)
BUN: 17 mg/dL (ref 6–23)
CO2: 27 mEq/L (ref 19–32)
Calcium: 9.5 mg/dL (ref 8.4–10.5)
Chloride: 98 mEq/L (ref 96–112)
Creatinine, Ser: 0.73 mg/dL (ref 0.40–1.20)
GFR: 76.33 mL/min (ref >60.00)
Glucose, Bld: 144 mg/dL — ABNORMAL HIGH (ref 70–99)
Potassium: 3.8 mEq/L (ref 3.5–5.1)
Sodium: 135 mEq/L (ref 135–145)
Total Bilirubin: 0.6 mg/dL (ref 0.2–1.2)
Total Protein: 7.3 g/dL (ref 6.0–8.3)

## 2019-03-31 LAB — HEPATIC FUNCTION PANEL
ALT: 32 U/L (ref 0–35)
AST: 33 U/L (ref 0–37)
Albumin: 4.6 g/dL (ref 3.5–5.2)
Alkaline Phosphatase: 43 U/L (ref 39–117)
Bilirubin, Direct: 0.1 mg/dL (ref 0.0–0.3)
Total Bilirubin: 0.6 mg/dL (ref 0.2–1.2)
Total Protein: 7.3 g/dL (ref 6.0–8.3)

## 2019-04-05 ENCOUNTER — Encounter: Payer: Self-pay | Admitting: *Deleted

## 2019-04-06 ENCOUNTER — Telehealth: Payer: Self-pay | Admitting: *Deleted

## 2019-04-06 NOTE — Telephone Encounter (Signed)
Copied from [place] [id number]. Topic: General - Other >> Apr 06, 2019 10:38 AM Yvette Rack wrote: Reason for CRM: Pt stated she was returning call to [place]. Pt requests call back.

## 2019-04-07 DIAGNOSIS — Z23 Encounter for immunization: Secondary | ICD-10-CM | POA: Diagnosis not present

## 2019-04-07 NOTE — Telephone Encounter (Signed)
We can discuss any additional lab work at her upcoming appointment.

## 2019-04-07 NOTE — Telephone Encounter (Signed)
Called the patient back and gave lab results, she states she has an upcoming appt. With the provider on 04/15/2019, she can have labs then or she can wait, just a FYI. Nina,cma

## 2019-04-13 ENCOUNTER — Other Ambulatory Visit: Payer: Self-pay

## 2019-04-15 ENCOUNTER — Ambulatory Visit (INDEPENDENT_AMBULATORY_CARE_PROVIDER_SITE_OTHER): Payer: Medicare Other | Admitting: Family Medicine

## 2019-04-15 ENCOUNTER — Other Ambulatory Visit: Payer: Self-pay

## 2019-04-15 ENCOUNTER — Encounter: Payer: Self-pay | Admitting: Family Medicine

## 2019-04-15 DIAGNOSIS — R195 Other fecal abnormalities: Secondary | ICD-10-CM

## 2019-04-15 DIAGNOSIS — I2510 Atherosclerotic heart disease of native coronary artery without angina pectoris: Secondary | ICD-10-CM | POA: Diagnosis not present

## 2019-04-15 DIAGNOSIS — K219 Gastro-esophageal reflux disease without esophagitis: Secondary | ICD-10-CM

## 2019-04-15 DIAGNOSIS — Z9861 Coronary angioplasty status: Secondary | ICD-10-CM

## 2019-04-15 DIAGNOSIS — F419 Anxiety disorder, unspecified: Secondary | ICD-10-CM | POA: Diagnosis not present

## 2019-04-15 NOTE — Progress Notes (Signed)
  [name], MD Phone: [phone]  Amy Morton is a 84 y.o. female who presents today for follow-up.  Loose stools: Patient notes this is improved significantly.  She has cut out almost all vegetable matter.  She is having normal bowel movements.  She does not eat much meat.  She does eat beans.  She notes cabbage and tomatoes cause the worst issues.  GERD: She is no longer on Protonix.  She notes no reflux symptoms.  No abdominal pain, blood in her stool, or dysphagia.  Anxiety: She notes no anxiety issues or depression issues.  She continues on Remeron and Celexa.  Social History   Tobacco Use  Smoking Status Never Smoker  Smokeless Tobacco Never Used     ROS see history of present illness  Objective  Physical Exam Vitals:   04/15/19 0958  BP: 140/78  Pulse: 72  Temp: (!) 97.4 F (36.3 C)  SpO2: 98%    BP Readings from Last 3 Encounters:  04/15/19 140/78  02/24/19 124/66  08/13/18 (!) 150/80   Wt Readings from Last 3 Encounters:  04/15/19 147 lb 3.2 oz (66.8 kg)  03/08/19 144 lb (65.3 kg)  02/24/19 144 lb 8 oz (65.5 kg)    Physical Exam Constitutional:      General: She is not in acute distress.    Appearance: She is not diaphoretic.  Cardiovascular:     Rate and Rhythm: Normal rate and regular rhythm.     Heart sounds: Normal heart sounds.  Pulmonary:     Effort: Pulmonary effort is normal.     Breath sounds: Normal breath sounds.  Abdominal:     General: Bowel sounds are normal. There is no distension.     Palpations: Abdomen is soft.     Tenderness: There is no abdominal tenderness. There is no guarding or rebound.  Skin:    General: Skin is warm and dry.  Neurological:     Mental Status: She is alert.      Assessment/Plan: Please see individual problem list.  GERD (gastroesophageal reflux disease) Asymptomatic.  No longer on medication.  Monitor for recurrence.  Loose stools Improved.  Given FODMAP foods to look at and  reintroduce some of the back into her diet.  Advised if she develops loose stools related to intake of these kinds of food she should try to eliminate them from her diet.  If things worsen again she will let us know.  Anxiety Asymptomatic.  Continue current regimen.   No orders of the defined types were placed in this encounter.   No orders of the defined types were placed in this encounter.    [name], MD [place]

## 2019-04-15 NOTE — Assessment & Plan Note (Signed)
Asymptomatic.  No longer on medication.  Monitor for recurrence.

## 2019-04-15 NOTE — Assessment & Plan Note (Signed)
Asymptomatic.  Continue current regimen. 

## 2019-04-15 NOTE — Patient Instructions (Signed)
Nice to see you. Please look at the list of foods and try to add things back in.  They cause loose stools please stop eating them.

## 2019-04-15 NOTE — Assessment & Plan Note (Signed)
Improved.  Given FODMAP foods to look at and reintroduce some of the back into her diet.  Advised if she develops loose stools related to intake of these kinds of food she should try to eliminate them from her diet.  If things worsen again she will let us know.

## 2019-06-15 ENCOUNTER — Other Ambulatory Visit: Payer: Self-pay | Admitting: Internal Medicine

## 2019-06-15 ENCOUNTER — Telehealth: Payer: Self-pay

## 2019-06-15 ENCOUNTER — Other Ambulatory Visit: Payer: Self-pay

## 2019-06-15 DIAGNOSIS — G4700 Insomnia, unspecified: Secondary | ICD-10-CM

## 2019-06-15 DIAGNOSIS — F419 Anxiety disorder, unspecified: Secondary | ICD-10-CM

## 2019-06-15 DIAGNOSIS — F329 Major depressive disorder, single episode, unspecified: Secondary | ICD-10-CM

## 2019-06-15 MED ORDER — MIRTAZAPINE 7.5 MG PO TABS: ORAL_TABLET | ORAL | 0 refills | Status: DC

## 2019-06-15 MED ORDER — OMEGA-3-ACID ETHYL ESTERS 1 G PO CAPS: 2.0000 | ORAL_CAPSULE | Freq: Two times a day (BID) | ORAL | 1 refills | Status: DC

## 2019-06-15 MED ORDER — CITALOPRAM HYDROBROMIDE 20 MG PO TABS: ORAL_TABLET | ORAL | 0 refills | Status: DC

## 2019-07-06 NOTE — Telephone Encounter (Signed)
Message sent to provider.  Nina,cma

## 2019-08-08 DIAGNOSIS — H35033 Hypertensive retinopathy, bilateral: Secondary | ICD-10-CM | POA: Diagnosis not present

## 2019-08-08 DIAGNOSIS — H52223 Regular astigmatism, bilateral: Secondary | ICD-10-CM | POA: Diagnosis not present

## 2019-08-19 ENCOUNTER — Encounter: Payer: Self-pay | Admitting: Family Medicine

## 2019-08-19 ENCOUNTER — Ambulatory Visit (INDEPENDENT_AMBULATORY_CARE_PROVIDER_SITE_OTHER): Payer: Medicare Other | Admitting: Family Medicine

## 2019-08-19 ENCOUNTER — Other Ambulatory Visit: Payer: Self-pay

## 2019-08-19 DIAGNOSIS — I2583 Coronary atherosclerosis due to lipid rich plaque: Secondary | ICD-10-CM | POA: Diagnosis not present

## 2019-08-19 DIAGNOSIS — R7303 Prediabetes: Secondary | ICD-10-CM | POA: Diagnosis not present

## 2019-08-19 DIAGNOSIS — J449 Chronic obstructive pulmonary disease, unspecified: Secondary | ICD-10-CM

## 2019-08-19 DIAGNOSIS — I2510 Atherosclerotic heart disease of native coronary artery without angina pectoris: Secondary | ICD-10-CM | POA: Diagnosis not present

## 2019-08-19 DIAGNOSIS — F419 Anxiety disorder, unspecified: Secondary | ICD-10-CM

## 2019-08-19 MED ORDER — FLUTICASONE-SALMETEROL 250-50 MCG/DOSE IN AEPB: INHALATION_SPRAY | RESPIRATORY_TRACT | 2 refills | Status: DC

## 2019-08-19 MED ORDER — ALBUTEROL SULFATE HFA 108 (90 BASE) MCG/ACT IN AERS: 2.0000 | INHALATION_SPRAY | RESPIRATORY_TRACT | 0 refills | Status: DC | PRN

## 2019-08-19 NOTE — Progress Notes (Signed)
Virtual Visit via telephone Note  This visit type was conducted due to national recommendations for restrictions regarding the COVID-19 pandemic (e.g. social distancing).  This format is felt to be most appropriate for this patient at this time.  All issues noted in this document were discussed and addressed.  No physical exam was performed (except for noted visual exam findings with Video Visits).   I connected with Amy Morton today at 10:30 AM EST by telephone and verified that I am speaking with the correct person using two identifiers. Location patient: home Location provider: work  Persons participating in the virtual visit: patient, provider  I discussed the limitations, risks, security and privacy concerns of performing an evaluation and management service by telephone and the availability of in person appointments. I also discussed with the patient that there may be a patient responsible charge related to this service. The patient expressed understanding and agreed to proceed.  Interactive audio and video telecommunications were attempted between this provider and patient, however failed, due to patient having technical difficulties OR patient did not have access to video capability.  We continued and completed visit with audio only.   Reason for visit: follow-up  HPI: Anxiety: No anxiety.  No depression.  Continues on Celexa and mirtazapine.  CAD: Taking Lipitor, Plavix, Imdur, and metoprolol.  No chest pain or shortness of breath.  COPD: She notes over the last 3 weeks she has had 1-2 coughs per day.  Nothing has progressed.  No congestion, fevers, rhinorrhea, or sore throat.  No loss of taste or smell.  No COVID-19 exposure.  She is no longer on Advair.  She has been staying home and only goes to the grocery store once every 2 weeks.  Prediabetes: No polyuria or polydipsia.   ROS: See pertinent positives and negatives per HPI.  Past Medical History:  Diagnosis Date    . Abnormal mammogram 2007   Recommend close follow up  . Anemia    Iron, b12, SPEP, UPEP normal, 12/2012  . Anxiety   . Aortic stenosis    mild to moderate  . aortic stenosis   . Arthritis   . Asthma   . CAD (coronary artery disease)    s/p angioplasty 1996  . Chronic cough   . Depression   . Diverticulosis   . Dyspnea   . Elevated LFTs 2010   s/p ultrasound w/ possible fatty liver; GI consult. Resolution 2011  . Gastric ulcer   . GERD (gastroesophageal reflux disease)   . History of colonic polyps    Dr. Vira Agar  . HOH (hard of hearing)   . Hyperlipidemia   . Hypertension   . Peritoneal carcinomatosis [facility]) 2001   Dr. Oliva Bustard & Dr. Claiborne Rigg s/p chemo  . Shingles 2008   T-9 distribution  . Valvular heart disease    Mild to Moderate MR, AS  . Vitamin D deficiency   . Wheezing     Past Surgical History:  Procedure Laterality Date  . ABDOMINAL HYSTERECTOMY  2001  . APPENDECTOMY    . CARDIAC CATHETERIZATION  2007   50% mid LAD stenosis, 70% proximal RCA with 100% distal RCA stenosis with collaterals, EF 65%  . CARDIAC CATHETERIZATION N/A 04/27/2015   Procedure: Left Heart Cath and Coronary Angiography;  Surgeon: Jettie Booze, MD;  Location: [place] [facility];  Service: Cardiovascular;  Laterality: N/A;  . CARDIAC CATHETERIZATION N/A 04/27/2015   Procedure: Coronary Stent Intervention;  Surgeon: Jettie Booze, MD;  Location: [place] [facility];  Service: Cardiovascular;  Laterality: N/A;  . CATARACT EXTRACTION W/PHACO Left 09/25/2016   Procedure: CATARACT EXTRACTION PHACO AND INTRAOCULAR LENS PLACEMENT (IOC);  Surgeon: Eulogio Bear, MD;  Location: [facility] ORS;  Service: Ophthalmology;  Laterality: Left;  [place] 46.7 AP% 8.1 CDE 3.80 Fluid pack lot # [id number]  . CATARACT EXTRACTION W/PHACO Right 11/06/2016   Procedure: CATARACT EXTRACTION PHACO AND INTRAOCULAR LENS PLACEMENT ([place]);  Surgeon: Eulogio Bear, MD;  Location: [facility] OR[facility];  Service: Ophthalmology;   Laterality: Right;  [place] 49.5 AP8.7 CDE4.28 LOT # [id number]  . CHOLECYSTECTOMY  1987  . COLECTOMY  2001  . CORONARY ANGIOPLASTY  1996  . CORONARY ANGIOPLASTY    . LAPAROSCOPIC BILATERAL SALPINGO OOPHERECTOMY  1988   Dr. Laverta Baltimore  . LEFT HEART CATH AND CORONARY ANGIOGRAPHY N/A 01/25/2018   Procedure: LEFT HEART CATH AND CORONARY ANGIOGRAPHY;  Surgeon: Wellington Hampshire, MD;  Location: [place] [facility];  Service: Cardiovascular;  Laterality: N/A;  . TONSILLECTOMY  1950    Family History  Problem Relation Age of Onset  . Hypertension Mother   . Stroke Mother 61       Cerebral Hemorrhage  . Cancer Father        bone cancer  . Diabetes Sister   . Diabetes Brother   . Heart disease Brother   . Cancer Daughter 39       Ovarian cancer  . Colon cancer Neg Hx   . Colon polyps Neg Hx     SOCIAL HX: Non-smoker   Current Outpatient Medications:  .  acetaminophen (TYLENOL) 650 MG CR tablet, Take 1,000 mg by mouth 2 (two) times daily. , Disp: , Rfl:  .  albuterol (VENTOLIN HFA) 108 (90 Base) MCG/ACT inhaler, Inhale 2 puffs into the lungs every 4 (four) hours as needed for wheezing or shortness of breath., Disp: 18 g, Rfl: 0 .  aspirin 81 MG tablet, Take 81 mg by mouth daily., Disp: , Rfl:  .  atorvastatin (LIPITOR) 80 MG tablet, Take 0.5 tablets (40 mg total) by mouth daily at 6 PM., Disp: 45 tablet, Rfl: 2 .  calcium gluconate 650 MG tablet, Take 650 mg by mouth 2 (two) times daily., Disp: , Rfl:  .  Cholecalciferol 5000 units TABS, Take 5,000 Units by mouth daily., Disp: , Rfl:  .  citalopram (CELEXA) 20 MG tablet, TAKE 1 TABLET(20 MG) BY MOUTH DAILY, Disp: 90 tablet, Rfl: 0 .  clopidogrel (PLAVIX) 75 MG tablet, TAKE 1 TABLET BY MOUTH DAILY WITH BREAKFAST, Disp: 90 tablet, Rfl: 3 .  Fluticasone-Salmeterol (ADVAIR DISKUS) 250-50 MCG/DOSE AEPB, INHALE 1 PUFF INTO THE LUNGS TWICE DAILY, Disp: 60 each, Rfl: 2 .  isosorbide mononitrate (IMDUR) 30 MG 24 hr tablet, TAKE 1 TABLET(30 MG) BY MOUTH  DAILY, Disp: 90 tablet, Rfl: 2 .  metoprolol tartrate (LOPRESSOR) 50 MG tablet, Take 1 tablet (50 mg total) by mouth 2 (two) times daily., Disp: 180 tablet, Rfl: 3 .  mirtazapine (REMERON) 7.5 MG tablet, qhs, Disp: 90 tablet, Rfl: 0 .  Multiple Vitamin (MULTIVITAMIN) capsule, Take 1 capsule by mouth daily., Disp: , Rfl:  .  nitroGLYCERIN (NITROSTAT) 0.4 MG SL tablet, PLACE 1 TABLET UNDER THE TONGUE EVERY 5 MINUTES AS NEEDED FOR CHEST PAIN, 3 DOSES MAX, Disp: 75 tablet, Rfl: 3 .  omega-3 acid ethyl esters (LOVAZA) 1 g capsule, Take 2 capsules (2 g total) by mouth 2 (two) times daily., Disp: 360 capsule, Rfl: 1 .  pantoprazole (PROTONIX) 40 MG tablet, Take 1 tablet (40 mg total) by mouth daily., Disp: 90 tablet, Rfl: 2 .  vitamin B-12 (CYANOCOBALAMIN) 250 MCG tablet, Take 250 mcg by mouth daily., Disp: , Rfl:  No current facility-administered medications for this visit.  Facility-Administered Medications Ordered in Other Visits:  .  sodium chloride 0.9 % nebulizer solution 3 mL, 3 mL, Nebulization, Once **FOLLOWED BY** methacholine (PROVOCHOLINE) inhaler solution 0.125 mg, 2 mL, Inhalation, Once **FOLLOWED BY** methacholine (PROVOCHOLINE) inhaler solution 0.5 mg, 2 mL, Inhalation, Once **FOLLOWED BY** methacholine (PROVOCHOLINE) inhaler solution 2 mg, 2 mL, Inhalation, Once **FOLLOWED BY** methacholine (PROVOCHOLINE) inhaler solution 8 mg, 2 mL, Inhalation, Once **FOLLOWED BY** methacholine (PROVOCHOLINE) inhaler solution 32 mg, 2 mL, Inhalation, Once **FOLLOWED BY** [COMPLETED] albuterol (PROVENTIL) (2.5 MG/3ML) 0.083% nebulizer solution 2.5 mg, 2.5 mg, Nebulization, Once, [name], MD, 2.5 mg at 08/27/17 0908  EXAM: This is a telehealth telephone visit and thus no physical exam was completed.  ASSESSMENT AND PLAN:  Discussed the following assessment and plan:  CAD (coronary artery disease) Asymptomatic.  Continue current regimen.  COPD (chronic obstructive pulmonary disease)  (HCC) Mild coughing likely related to COPD.  Discussed there is the potential it could have been related to Covid though she is been 3 weeks since the onset and thus testing is likely not beneficial as there could be a false negative.  She notes no real significant symptoms other than mild cough 1-2 times daily.  We will start her on Advair.  She will continue her albuterol as needed.  Anxiety Asymptomatic.  Continue current regimen.  Prediabetes Plan for A1c with next set of labs in 3 months.   No orders of the defined types were placed in this encounter.   Meds ordered this encounter  Medications  . albuterol (VENTOLIN HFA) 108 (90 Base) MCG/ACT inhaler    Sig: Inhale 2 puffs into the lungs every 4 (four) hours as needed for wheezing or shortness of breath.    Dispense:  18 g    Refill:  0  . Fluticasone-Salmeterol (ADVAIR DISKUS) 250-50 MCG/DOSE AEPB    Sig: INHALE 1 PUFF INTO THE LUNGS TWICE DAILY    Dispense:  60 each    Refill:  2     I discussed the assessment and treatment plan with the patient. The patient was provided an opportunity to ask questions and all were answered. The patient agreed with the plan and demonstrated an understanding of the instructions.   The patient was advised to call back or seek an in-person evaluation if the symptoms worsen or if the condition fails to improve as anticipated.  I provided 11 minutes of non-face-to-face time during this encounter.   [name], MD

## 2019-08-19 NOTE — Assessment & Plan Note (Signed)
Plan for A1c with next set of labs in 3 months.

## 2019-08-19 NOTE — Assessment & Plan Note (Signed)
Asymptomatic.  Continue current regimen. 

## 2019-08-19 NOTE — Assessment & Plan Note (Signed)
Mild coughing likely related to COPD.  Discussed there is the potential it could have been related to Covid though she is been 3 weeks since the onset and thus testing is likely not beneficial as there could be a false negative.  She notes no real significant symptoms other than mild cough 1-2 times daily.  We will start her on Advair.  She will continue her albuterol as needed.

## 2019-09-13 ENCOUNTER — Other Ambulatory Visit: Payer: Self-pay

## 2019-09-13 MED ORDER — PANTOPRAZOLE SODIUM 40 MG PO TBEC: 40.0000 mg | DELAYED_RELEASE_TABLET | Freq: Every day | ORAL | 0 refills | Status: DC

## 2019-09-15 ENCOUNTER — Other Ambulatory Visit: Payer: Self-pay | Admitting: Internal Medicine

## 2019-09-15 DIAGNOSIS — F329 Major depressive disorder, single episode, unspecified: Secondary | ICD-10-CM

## 2019-09-15 DIAGNOSIS — F419 Anxiety disorder, unspecified: Secondary | ICD-10-CM

## 2019-09-15 MED ORDER — CITALOPRAM HYDROBROMIDE 20 MG PO TABS: ORAL_TABLET | ORAL | 0 refills | Status: DC

## 2019-09-15 NOTE — Addendum Note (Signed)
Addended by: Fulton Mole D on: 09/15/2019 11:05 AM   Modules accepted: Orders

## 2019-09-15 NOTE — Telephone Encounter (Signed)
Pt needs refill of celexa 20 mg qd

## 2019-09-15 NOTE — Telephone Encounter (Signed)
Refill needed remeron 7.5 mg qhs

## 2019-11-02 ENCOUNTER — Telehealth: Payer: Self-pay | Admitting: Cardiovascular Disease

## 2019-11-02 NOTE — Telephone Encounter (Signed)
Her blood pressure was well controlled when she was seen in our office last year.  She is due for 6 months follow-up and we should go ahead and schedule that.  Please ask her to bring her blood pressure machine to the office visit so we can compare with our readings.

## 2019-11-02 NOTE — Telephone Encounter (Signed)
Pt c/o BP issue: STAT if pt c/o blurred vision, one-sided weakness or slurred speech  1. What are your last 5 BP readings?  4/6-190/82 am, 183/78 pm 4/7 -181/80 6:25 am, 9:40 160/78  2. Are you having any other symptoms (ex. Dizziness, headache, blurred vision, passed out)? Slight headache for 2 days that was resolved with Tylenol  3. What is your BP issue? elevated

## 2019-11-02 NOTE — Telephone Encounter (Signed)
Spoke with the patient. Patient sts that her BP and been elevated for 2 days. Patient provide her BP readings below. Her HR averages 66-72 bpm. Patient reports a mild headache that has responded to Tylenol. She is taking her medications as prescribed.  She denies increased sodium intake. She was given some bad news 2-3 days ago and she has not been sleeping well. Adv her that stress could contribute to elevated BP readings.  Advised the patient to continue to monitor her BP daily. Adv her that it is ok to use Tylenol prn for headache and to not exceed the recommended daily dosing.  Adv the patient that I will fwd the message to Dr. Fletcher Anon to advise. She is to contact the office in the interim for consistently elevated readings or if symptoms develop. Patient agreeable with plan and voiced appreciation.

## 2019-11-02 NOTE — Telephone Encounter (Signed)
Called to give the patient Dr. Arida's response and recommendation. [email or web address]. 

## 2019-11-03 ENCOUNTER — Other Ambulatory Visit: Payer: Self-pay

## 2019-11-03 ENCOUNTER — Ambulatory Visit (INDEPENDENT_AMBULATORY_CARE_PROVIDER_SITE_OTHER): Payer: Medicare Other | Admitting: Cardiovascular Disease

## 2019-11-03 ENCOUNTER — Encounter: Payer: Self-pay | Admitting: Cardiovascular Disease

## 2019-11-03 VITALS — BP 140/80 | HR 77 | Ht 59.0 in | Wt 142.5 lb

## 2019-11-03 DIAGNOSIS — I350 Nonrheumatic aortic (valve) stenosis: Secondary | ICD-10-CM

## 2019-11-03 DIAGNOSIS — I2510 Atherosclerotic heart disease of native coronary artery without angina pectoris: Secondary | ICD-10-CM

## 2019-11-03 DIAGNOSIS — I10 Essential (primary) hypertension: Secondary | ICD-10-CM

## 2019-11-03 DIAGNOSIS — E785 Hyperlipidemia, unspecified: Secondary | ICD-10-CM

## 2019-11-03 DIAGNOSIS — I2583 Coronary atherosclerosis due to lipid rich plaque: Secondary | ICD-10-CM

## 2019-11-03 NOTE — Progress Notes (Signed)
Cardiology Office Note   Date:  11/03/2019   ID:  Amy Morton, DOB 10-May-1935, MRN [id number]  PCP:  [name], MD  Cardiologist:   Kathlyn Sacramento, MD   Chief Complaint  Patient presents with  . office visit    Patient reports elevated blood pressure, nausea, and dizziness; Meds verbally reviewed with patient.      History of Present Illness: Amy Morton is a 85 y.o. female who presents for for a follow-up visit regarding coronary artery disease and moderate aortic stenosis. Other medical problems include anemia, hypertension, hyperlipidemia, anxiety and depression. She had previous left circumflex PCI in 2016 with known occluded distal right coronary artery. The patient was hospitalized in June 2019 at [facility] with unstable angina.  She underwent cardiac catheterization which showed patent left circumflex stents without significant restenosis, stable discrete 70% stenosis in the mid LAD and chronically occluded distal RCA with collaterals.  Aortic stenosis was moderate with a peak to peak gradient of 20 mmHg.  LVEDP was mildly elevated.  Overall, there was no significant change in coronary anatomy since 2016.  She lives by herself as her husband died more than 10 years ago.  She only has 1 son alive.  She continues to be independent.  She drives and she lives by herself.  She called our office yesterday because her blood pressure was running high with her blood pressure machine close to [id number] mmHg systolic.  She felt anxious after that.  She had low-grade fever few days ago at 100 Fahrenheit.  In addition, she felt nauseous and vomited few days ago.  No chest pain.  No shortness of breath.   Past Medical History:  Diagnosis Date  . Abnormal mammogram 2007   Recommend close follow up  . Anemia    Iron, b12, SPEP, UPEP normal, 12/2012  . Anxiety   . Aortic stenosis    mild to moderate  . aortic stenosis   . Arthritis   . Asthma   . CAD (coronary artery  disease)    s/p angioplasty 1996  . Chronic cough   . Depression   . Diverticulosis   . Dyspnea   . Elevated LFTs 2010   s/p ultrasound w/ possible fatty liver; GI consult. Resolution 2011  . Gastric ulcer   . GERD (gastroesophageal reflux disease)   . History of colonic polyps    Dr. Vira Agar  . HOH (hard of hearing)   . Hyperlipidemia   . Hypertension   . Peritoneal carcinomatosis [facility]) 2001   Dr. Oliva Bustard & Dr. Claiborne Rigg s/p chemo  . Shingles 2008   T-9 distribution  . Valvular heart disease    Mild to Moderate MR, AS  . Vitamin D deficiency   . Wheezing     Past Surgical History:  Procedure Laterality Date  . ABDOMINAL HYSTERECTOMY  2001  . APPENDECTOMY    . CARDIAC CATHETERIZATION  2007   50% mid LAD stenosis, 70% proximal RCA with 100% distal RCA stenosis with collaterals, EF 65%  . CARDIAC CATHETERIZATION N/A 04/27/2015   Procedure: Left Heart Cath and Coronary Angiography;  Surgeon: Jettie Booze, MD;  Location: [place] [facility];  Service: Cardiovascular;  Laterality: N/A;  . CARDIAC CATHETERIZATION N/A 04/27/2015   Procedure: Coronary Stent Intervention;  Surgeon: Jettie Booze, MD;  Location: [place] [facility];  Service: Cardiovascular;  Laterality: N/A;  . CATARACT EXTRACTION W/PHACO Left 09/25/2016   Procedure: CATARACT EXTRACTION PHACO AND INTRAOCULAR  LENS PLACEMENT (IOC);  Surgeon: Eulogio Bear, MD;  Location: [facility] ORS;  Service: Ophthalmology;  Laterality: Left;  [place] 46.7 AP% 8.1 CDE 3.80 Fluid pack lot # [id number]  . CATARACT EXTRACTION W/PHACO Right 11/06/2016   Procedure: CATARACT EXTRACTION PHACO AND INTRAOCULAR LENS PLACEMENT ([place]);  Surgeon: Eulogio Bear, MD;  Location: [facility] ORS;  Service: Ophthalmology;  Laterality: Right;  [place] 49.5 AP8.7 CDE4.28 LOT # [id number]  . CHOLECYSTECTOMY  1987  . COLECTOMY  2001  . CORONARY ANGIOPLASTY  1996  . CORONARY ANGIOPLASTY    . LAPAROSCOPIC BILATERAL SALPINGO OOPHERECTOMY  1988   Dr. Laverta Baltimore  .  LEFT HEART CATH AND CORONARY ANGIOGRAPHY N/A 01/25/2018   Procedure: LEFT HEART CATH AND CORONARY ANGIOGRAPHY;  Surgeon: Wellington Hampshire, MD;  Location: [place] [facility];  Service: Cardiovascular;  Laterality: N/A;  . TONSILLECTOMY  1950     Current Outpatient Medications  Medication Sig Dispense Refill  . acetaminophen (TYLENOL) 650 MG CR tablet Take 1,000 mg by mouth 2 (two) times daily.     Marland Kitchen albuterol (VENTOLIN HFA) 108 (90 Base) MCG/ACT inhaler Inhale 2 puffs into the lungs every 4 (four) hours as needed for wheezing or shortness of breath. 18 g 0  . aspirin 81 MG tablet Take 81 mg by mouth daily.    Marland Kitchen atorvastatin (LIPITOR) 80 MG tablet Take 0.5 tablets (40 mg total) by mouth daily at 6 PM. 45 tablet 2  . calcium gluconate 650 MG tablet Take 650 mg by mouth 2 (two) times daily.    . Cholecalciferol 5000 units TABS Take 5,000 Units by mouth daily.    . citalopram (CELEXA) 20 MG tablet TAKE 1 TABLET(20 MG) BY MOUTH DAILY 90 tablet 0  . clopidogrel (PLAVIX) 75 MG tablet TAKE 1 TABLET BY MOUTH DAILY WITH BREAKFAST 90 tablet 3  . Fluticasone-Salmeterol (ADVAIR DISKUS) 250-50 MCG/DOSE AEPB INHALE 1 PUFF INTO THE LUNGS TWICE DAILY 60 each 2  . isosorbide mononitrate (IMDUR) 30 MG 24 hr tablet TAKE 1 TABLET(30 MG) BY MOUTH DAILY 90 tablet 2  . metoprolol tartrate (LOPRESSOR) 50 MG tablet Take 1 tablet (50 mg total) by mouth 2 (two) times daily. 180 tablet 3  . mirtazapine (REMERON) 7.5 MG tablet qhs 90 tablet 0  . Multiple Vitamin (MULTIVITAMIN) capsule Take 1 capsule by mouth daily.    . nitroGLYCERIN (NITROSTAT) 0.4 MG SL tablet PLACE 1 TABLET UNDER THE TONGUE EVERY 5 MINUTES AS NEEDED FOR CHEST PAIN, 3 DOSES MAX 75 tablet 3  . omega-3 acid ethyl esters (LOVAZA) 1 g capsule Take 2 capsules (2 g total) by mouth 2 (two) times daily. 360 capsule 1  . pantoprazole (PROTONIX) 40 MG tablet Take 1 tablet (40 mg total) by mouth daily. 90 tablet 0  . vitamin B-12 (CYANOCOBALAMIN) 250 MCG tablet  Take 250 mcg by mouth daily.     No current facility-administered medications for this visit.   Facility-Administered Medications Ordered in Other Visits  Medication Dose Route Frequency Provider Last Rate Last Admin  . sodium chloride 0.9 % nebulizer solution 3 mL  3 mL Nebulization Once [name], MD       Followed by  . methacholine (PROVOCHOLINE) inhaler solution 0.125 mg  2 mL Inhalation Once [name], MD       Followed by  . methacholine (PROVOCHOLINE) inhaler solution 0.5 mg  2 mL Inhalation Once [name], MD       Followed by  . methacholine (White)  inhaler solution 2 mg  2 mL Inhalation Once [name], MD       Followed by  . methacholine (PROVOCHOLINE) inhaler solution 8 mg  2 mL Inhalation Once [name], MD       Followed by  . methacholine (PROVOCHOLINE) inhaler solution 32 mg  2 mL Inhalation Once [name], MD        Allergies:   Abilify [aripiprazole]    Social History:  The patient  reports that she has never smoked. She has never used smokeless tobacco. She reports that she does not drink alcohol or use drugs.   Family History:  The patient's family history includes Cancer in her father; Cancer (age of onset: 40) in her daughter; Diabetes in her brother and sister; Heart disease in her brother; Hypertension in her mother; Stroke (age of onset: 31) in her mother.    ROS:  Please see the history of present illness.   Otherwise, review of systems are positive for none.   All other systems are reviewed and negative.    PHYSICAL EXAM: VS:  BP 140/80 (BP Location: Right Arm, Patient Position: Sitting, Cuff Size: Normal)   Pulse 77   Ht 4\' 11"  (1.499 m)   Wt 142 lb 8 oz (64.6 kg)   SpO2 94%   BMI 28.78 kg/m  , BMI Body mass index is 28.78 kg/m. GEN: Well nourished, well developed, in no acute distress  HEENT: normal  Neck: no JVD, carotid bruits, or masses Cardiac: RRR; no rubs, or gallops,no edema .   2/6 crescendo decrescendo systolic murmur in the aortic area which is mid peaking. Respiratory:  clear to auscultation bilaterally, normal work of breathing GI: soft, nontender, nondistended, + BS MS: no deformity or atrophy  Skin: warm and dry, no rash Neuro:  Strength and sensation are intact Psych: euthymic mood, full affect   EKG:  EKG is ordered today. The ekg ordered today demonstrates normal sinus rhythm with moderate LVH with poor R wave progression in the anterior leads.   Recent Labs: 03/14/2019: Magnesium 1.5 03/31/2019: ALT 32; ALT 32; BUN 17; Creatinine, Ser 0.73; Potassium 3.8; Sodium 135    Lipid Panel    Component Value Date/Time   CHOL 109 02/21/2019 1012   CHOL 115 09/03/2017 1049   TRIG 204.0 (H) 02/21/2019 1012   HDL 45.40 02/21/2019 1012   HDL 50 09/03/2017 1049   CHOLHDL 2 02/21/2019 1012   VLDL 40.8 (H) 02/21/2019 1012   LDLCALC 20 09/13/2018 0906   LDLCALC 29 09/03/2017 1049   LDLDIRECT 35.0 02/21/2019 1012      Wt Readings from Last 3 Encounters:  11/03/19 142 lb 8 oz (64.6 kg)  08/19/19 142 lb (64.4 kg)  04/15/19 147 lb 3.2 oz (66.8 kg)       No flowsheet data found.    ASSESSMENT AND PLAN:  1.  Coronary artery disease involving native coronary arteries without angina: Overall she is doing well.  I recommend continuing medical therapy.  2.  Moderate aortic stenosis: Repeat echocardiogram in August of this year.  3.  Essential hypertension: The patient brought her blood pressure machine with her which did not correlate with our manual readings.  I checked myself.  Blood pressure was 184/70 with her machine but was 144/70 manually.  There is a clear discrepancy.  She has not taken her blood pressure medications this morning.  Thus, I elected not to make any changes in her blood pressure medications.  I explained to her that she does not really need to monitor her blood pressure that often.  Automatic blood pressure machines might not be  accurate in her situation as I suspect her arteries are calcified.  4.  Hyperlipidemia: Continue treatment with high-dose atorvastatin.  Most recent LDL was 29.     Disposition:   FU with me in 6 months  Signed,  Kathlyn Sacramento, MD  11/03/2019 10:45 AM    [place]

## 2019-11-03 NOTE — Patient Instructions (Signed)
Medication Instructions:  Your physician recommends that you continue on your current medications as directed. Please refer to the Current Medication list given to you today.  *If you need a refill on your cardiac medications before your next appointment, please call your pharmacy*   Lab Work: None ordered If you have labs (blood work) drawn today and your tests are completely normal, you will receive your results only by: Marland Kitchen MyChart Message (if you have MyChart) OR . A paper copy in the mail If you have any lab test that is abnormal or we need to change your treatment, we will call you to review the results.   Testing/Procedures: Your physician has requested that you have an echocardiogram. Echocardiography is a painless test that uses sound waves to create images of your heart. It provides your doctor with information about the size and shape of your heart and how well your heart's chambers and valves are working. This procedure takes approximately one hour. There are no restrictions for this procedure. (To be scheduled in August 2021)    Follow-Up: At [facility], you and your health needs are our priority.  As part of our continuing mission to provide you with exceptional heart care, we have created designated Provider Care Teams.  These Care Teams include your primary Cardiologist (physician) and Advanced Practice Providers (APPs -  Physician Assistants and Nurse Practitioners) who all work together to provide you with the care you need, when you need it.  We recommend signing up for the patient portal called "MyChart".  Sign up information is provided on this After Visit Summary.  MyChart is used to connect with patients for Virtual Visits (Telemedicine).  Patients are able to view lab/test results, encounter notes, upcoming appointments, etc.  Non-urgent messages can be sent to your provider as well.   To learn more about what you can do with MyChart, go to [email or web address].      Your next appointment:   6 month(s)  The format for your next appointment:   In Person  Provider:    You may see Dr. Fletcher Anon or one of the following Advanced Practice Providers on your designated Care Team:    Murray Hodgkins, NP  Christell Faith, PA-C  Marrianne Mood, PA-C    Other Instructions  Echocardiogram An echocardiogram is a procedure that uses painless sound waves (ultrasound) to produce an image of the heart. Images from an echocardiogram can provide important information about:  Signs of coronary artery disease (CAD).  Aneurysm detection. An aneurysm is a weak or damaged part of an artery wall that bulges out from the normal force of blood pumping through the body.  Heart size and shape. Changes in the size or shape of the heart can be associated with certain conditions, including heart failure, aneurysm, and CAD.  Heart muscle function.  Heart valve function.  Signs of a past heart attack.  Fluid buildup around the heart.  Thickening of the heart muscle.  A tumor or infectious growth around the heart valves. Tell a health care provider about:  Any allergies you have.  All medicines you are taking, including vitamins, herbs, eye drops, creams, and over-the-counter medicines.  Any blood disorders you have.  Any surgeries you have had.  Any medical conditions you have.  Whether you are pregnant or may be pregnant. What are the risks? Generally, this is a safe procedure. However, problems may occur, including:  Allergic reaction to dye (contrast) that may be used during the procedure. What  happens before the procedure? No specific preparation is needed. You may eat and drink normally. What happens during the procedure?   An IV tube may be inserted into one of your veins.  You may receive contrast through this tube. A contrast is an injection that improves the quality of the pictures from your heart.  A gel will be applied to your chest.  A  wand-like tool (transducer) will be moved over your chest. The gel will help to transmit the sound waves from the transducer.  The sound waves will harmlessly bounce off of your heart to allow the heart images to be captured in real-time motion. The images will be recorded on a computer. The procedure may vary among health care providers and hospitals. What happens after the procedure?  You may return to your normal, everyday life, including diet, activities, and medicines, unless your health care provider tells you not to do that. Summary  An echocardiogram is a procedure that uses painless sound waves (ultrasound) to produce an image of the heart.  Images from an echocardiogram can provide important information about the size and shape of your heart, heart muscle function, heart valve function, and fluid buildup around your heart.  You do not need to do anything to prepare before this procedure. You may eat and drink normally.  After the echocardiogram is completed, you may return to your normal, everyday life, unless your health care provider tells you not to do that. This information is not intended to replace advice given to you by your health care provider. Make sure you discuss any questions you have with your health care provider. Document Revised: 11/04/2018 Document Reviewed: 08/16/2016 Elsevier Patient Education  [place].

## 2019-11-03 NOTE — Telephone Encounter (Signed)
Incoming call received from the patients sister, she is not on the patients DPR but she is currently with the patient. Patients sister sts that the patients BP this morning 205/78 prior to medications. Denies stroke like symptoms. The pt does report feeling nauseous and feels poorly.    Added pt to DOD slot with Dr. Fletcher Anon today @ 10:40am. Adv the pt sister to bring the pt BP machine with them to the appt, and that they will need to enter through the medical mall entrance. Pt sister verbalized understanding and voiced appreciation for the assistance.

## 2019-11-07 ENCOUNTER — Encounter: Payer: Self-pay | Admitting: Family Medicine

## 2019-11-07 ENCOUNTER — Ambulatory Visit (INDEPENDENT_AMBULATORY_CARE_PROVIDER_SITE_OTHER): Payer: Medicare Other | Admitting: Family Medicine

## 2019-11-07 ENCOUNTER — Other Ambulatory Visit: Payer: Self-pay

## 2019-11-07 VITALS — BP 118/79 | HR 69 | Temp 97.6°F | Ht 59.0 in | Wt 142.8 lb

## 2019-11-07 DIAGNOSIS — R7303 Prediabetes: Secondary | ICD-10-CM | POA: Diagnosis not present

## 2019-11-07 DIAGNOSIS — K529 Noninfective gastroenteritis and colitis, unspecified: Secondary | ICD-10-CM | POA: Diagnosis not present

## 2019-11-07 DIAGNOSIS — I951 Orthostatic hypotension: Secondary | ICD-10-CM | POA: Diagnosis not present

## 2019-11-07 DIAGNOSIS — E559 Vitamin D deficiency, unspecified: Secondary | ICD-10-CM | POA: Diagnosis not present

## 2019-11-07 DIAGNOSIS — I2510 Atherosclerotic heart disease of native coronary artery without angina pectoris: Secondary | ICD-10-CM | POA: Diagnosis not present

## 2019-11-07 DIAGNOSIS — E785 Hyperlipidemia, unspecified: Secondary | ICD-10-CM

## 2019-11-07 DIAGNOSIS — I2583 Coronary atherosclerosis due to lipid rich plaque: Secondary | ICD-10-CM | POA: Diagnosis not present

## 2019-11-07 LAB — CBC
HCT: 35.4 % — ABNORMAL LOW (ref 36.0–46.0)
Hemoglobin: 12.1 g/dL (ref 12.0–15.0)
MCHC: 34.1 g/dL (ref 30.0–36.0)
MCV: 94.6 fl (ref 78.0–100.0)
Platelets: 159 10*3/uL (ref 150.0–400.0)
RBC: 3.74 Mil/uL — ABNORMAL LOW (ref 3.87–5.11)
RDW: 12.5 % (ref 11.5–15.5)
WBC: 7.3 10*3/uL (ref 4.0–10.5)

## 2019-11-07 LAB — COMPREHENSIVE METABOLIC PANEL
ALT: 23 U/L (ref 0–35)
AST: 30 U/L (ref 0–37)
Albumin: 4.4 g/dL (ref 3.5–5.2)
Alkaline Phosphatase: 45 U/L (ref 39–117)
BUN: 15 mg/dL (ref 6–23)
CO2: 23 mEq/L (ref 19–32)
Calcium: 9.5 mg/dL (ref 8.4–10.5)
Chloride: 96 mEq/L (ref 96–112)
Creatinine, Ser: 0.74 mg/dL (ref 0.40–1.20)
GFR: 75.03 mL/min (ref >60.00)
Glucose, Bld: 141 mg/dL — ABNORMAL HIGH (ref 70–99)
Potassium: 4.1 mEq/L (ref 3.5–5.1)
Sodium: 129 mEq/L — ABNORMAL LOW (ref 135–145)
Total Bilirubin: 0.6 mg/dL (ref 0.2–1.2)
Total Protein: 7.1 g/dL (ref 6.0–8.3)

## 2019-11-07 LAB — MAGNESIUM: Magnesium: 1.4 mg/dL — ABNORMAL LOW (ref 1.5–2.5)

## 2019-11-07 LAB — VITAMIN D 25 HYDROXY (VIT D DEFICIENCY, FRACTURES): VITD: 46.24 ng/mL (ref 30.00–100.00)

## 2019-11-07 LAB — HEMOGLOBIN A1C: Hgb A1c MFr Bld: 6.3 % (ref 4.6–6.5)

## 2019-11-07 NOTE — Assessment & Plan Note (Signed)
Well controlled on prior check. Plan for lipids at next visit. Continue lipitor.

## 2019-11-07 NOTE — Progress Notes (Signed)
Amy Rumps, MD Phone: [phone]  Amy Morton is a 85 y.o. female who presents today for f/u.  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  She notes no nausea or vomiting.  No abdominal pain.  No cough, congestion, fevers, or blood in her stool.  She takes loperamide occasionally.  Typically occurs 3-4 times a week.  Does note occasional abdominal tenderness on palpation though no abdominal pain.  Balance difficulty: Patient notes occasionally she will have balance issues when she gets up out of the chair or when she goes to stand up.  Does have some lightheadedness.  Had one episode of room spinning.  No falls.  No ear pain.  Social History   Tobacco Use  Smoking Status Never Smoker  Smokeless Tobacco Never Used     ROS see history of present illness  Objective  Physical Exam Vitals:   11/07/19 1112  BP: 118/79  Pulse: 69  Temp: 97.6 F (36.4 C)  SpO2: 97%   Lying blood pressure 140/60 pulse 64 Sitting blood pressure 120/70 pulse 65 Standing blood pressure 110/60 pulse 70  BP Readings from Last 3 Encounters:  11/07/19 118/79  11/03/19 140/80  04/15/19 140/78   Wt Readings from Last 3 Encounters:  11/07/19 142 lb 12.8 oz (64.8 kg)  11/03/19 142 lb 8 oz (64.6 kg)  08/19/19 142 lb (64.4 kg)    Physical Exam Constitutional:      General: She is not in acute distress.    Appearance: She is not diaphoretic.  HENT:     Right Ear: Tympanic membrane normal.     Left Ear: Tympanic membrane normal.  Cardiovascular:     Rate and Rhythm: Normal rate and regular rhythm.     Heart sounds: Normal heart sounds.  Pulmonary:     Effort: Pulmonary effort is normal.     Breath sounds: Normal breath sounds.  Abdominal:     General: Bowel sounds are normal. There is no distension.     Palpations: Abdomen is soft.     Tenderness: There is no guarding or rebound.     Comments: Slight tenderness mid  abdomen bilaterally  Musculoskeletal:     Right lower leg: No edema.     Left lower leg: No edema.  Skin:    General: Skin is warm and dry.  Neurological:     Mental Status: She is alert.      Assessment/Plan: Please see individual problem list.  Chronic diarrhea Iatrogenic status post colectomy in 2001.  Discussed she could continue as needed loperamide if she is going to be out and about.  She can add in a fiber supplement.  We will check lab work to ensure that she is not dehydrated.  She notes her tenderness is chronic and stable.  Discussed the potential for imaging depending on lab results.  Vitamin D deficiency Check vitamin D.  Prediabetes Check A1c.  Orthostasis I suspect this is contributing to her lightheadedness and balance issues.  We will check labs to make sure she is not anemic and not dehydrated.  Consider decreasing her metoprolol dose once her labs return.  Dyslipidemia, goal LDL below 70 Well controlled on prior check. Plan for lipids at next visit. Continue lipitor.    Orders Placed This Encounter  Procedures  . Comp Met (CMET)  . HgB A1c  . CBC  . Magnesium  . Vitamin D (25 hydroxy)    No  orders of the defined types were placed in this encounter.   This visit occurred during the SARS-CoV-2 public health emergency.  Safety protocols were in place, including screening questions prior to the visit, additional usage of staff PPE, and extensive cleaning of exam room while observing appropriate contact time as indicated for disinfecting solutions.    Amy Rumps, MD [place]

## 2019-11-07 NOTE — Assessment & Plan Note (Signed)
Check vitamin D. 

## 2019-11-07 NOTE — Patient Instructions (Signed)
Nice to see you. We will get labs today and contact you with the results. You can take loperamide on an as-needed basis.  It may be helpful to add in a fiber supplement that could potentially bulk your stool up.  Benefiber often works well.

## 2019-11-07 NOTE — Assessment & Plan Note (Signed)
I suspect this is contributing to her lightheadedness and balance issues.  We will check labs to make sure she is not anemic and not dehydrated.  Consider decreasing her metoprolol dose once her labs return.

## 2019-11-07 NOTE — Assessment & Plan Note (Signed)
Check A1c. 

## 2019-11-07 NOTE — Assessment & Plan Note (Addendum)
Iatrogenic status post colectomy in 2001.  Discussed she could continue as needed loperamide if she is going to be out and about.  She can add in a fiber supplement.  We will check lab work to ensure that she is not dehydrated.  She notes her tenderness is chronic and stable.  Discussed the potential for imaging depending on lab results.

## 2019-11-08 ENCOUNTER — Other Ambulatory Visit: Payer: Self-pay | Admitting: Family Medicine

## 2019-11-08 DIAGNOSIS — E871 Hypo-osmolality and hyponatremia: Secondary | ICD-10-CM

## 2019-11-08 MED ORDER — MAGNESIUM CHLORIDE 64 MG PO TBEC: 1.0000 | DELAYED_RELEASE_TABLET | Freq: Two times a day (BID) | ORAL | 0 refills | Status: DC

## 2019-11-10 ENCOUNTER — Other Ambulatory Visit: Payer: Self-pay | Admitting: Family Medicine

## 2019-11-10 DIAGNOSIS — K529 Noninfective gastroenteritis and colitis, unspecified: Secondary | ICD-10-CM

## 2019-11-11 ENCOUNTER — Encounter: Payer: Self-pay | Admitting: *Deleted

## 2019-11-14 ENCOUNTER — Encounter: Payer: Self-pay | Admitting: Gastroenterology

## 2019-11-15 ENCOUNTER — Telehealth (INDEPENDENT_AMBULATORY_CARE_PROVIDER_SITE_OTHER): Payer: Medicare Other | Admitting: Gastroenterology

## 2019-11-15 ENCOUNTER — Telehealth: Payer: Self-pay

## 2019-11-15 ENCOUNTER — Encounter: Payer: Self-pay | Admitting: Gastroenterology

## 2019-11-15 ENCOUNTER — Other Ambulatory Visit: Payer: Self-pay

## 2019-11-15 DIAGNOSIS — R159 Full incontinence of feces: Secondary | ICD-10-CM | POA: Diagnosis not present

## 2019-11-15 DIAGNOSIS — R197 Diarrhea, unspecified: Secondary | ICD-10-CM

## 2019-11-15 DIAGNOSIS — K529 Noninfective gastroenteritis and colitis, unspecified: Secondary | ICD-10-CM

## 2019-11-15 NOTE — Progress Notes (Signed)
Amy Morton [address]  [place]  [place], [place] [address]  Main: [phone]  Fax: [phone]   Gastroenterology Consultation  Referring Provider:     [name], MD Primary Care Physician:  [name], MD Reason for Consultation:    Chronic diarrhea        HPI:   Virtual Visit via Video Note  I connected with patient on 11/15/19 at  2:30 PM EDT by video ([email or web address]) and verified that I am speaking with the correct person using two identifiers.   I discussed the limitations, risks, security and privacy concerns of performing an evaluation and management service by video and the availability of in person appointments. I also discussed with the patient that there may be a patient responsible charge related to this service. The patient expressed understanding and agreed to proceed.  Location of the patient: Home Location of provider: Home Participating persons: Patient, family and provider only (Nursing staff checked in patient via phone but were not physically involved in the video interaction - see their notes)   History of Present Illness: Chief Complaint  Patient presents with  . New Patient (Initial Visit)  . Diarrhea    Patient states she has chronic diarrhea. Patient states she goes all day long some days and other days several times. Denies any nausea or abdominal pain     Amy Morton is a 85 y.o. y/o female referred for consultation & management  by Dr. Caryl Bis, Angela Adam, MD.  Patient referred for chronic diarrhea.  Patient states she was started on Metamucil by primary care provider a week ago and has already noticed a difference in that stools are more formed.  Usually has 3-4 watery bowel movements a day that are now more formed.  Also reports fecal incontinence intermittently.  The symptoms have been going on for years.  Reports history of 3 vaginal deliveries in the past.  Infectious GI profile in August 2020 was  negative.  The patient denies abdominal or flank pain, anorexia, nausea or vomiting, dysphagia, black or bloody stools or weight loss.   Pt Previously evaluated by GI in 2016, Dr. Henrene Pastor, and Dr. Vira Agar in 2013.  Remote history of peritoneal cancer and reportedly had one third of her colon removed in 2001.  Colonoscopy for iron deficiency anemia in 2013 by Dr. Vira Agar reported diverticulosis and hemorrhoids and small solitary ulcer at the anastomosis.  Colon was reported to be tortuous due to previous surgery.  Extent of exam was to the ileocolonic anastomosis.  Upper endoscopy showed duodenal mucosal atrophy in the second portion.  Hiatal hernia.  Normal stomach.  Biopsies showed chronic duodenitis.  She also had 2 upper endoscopies in 2005.  Initial one was in January 2005 for melena that showed 2 clean base gastric ulcers in the antrum.  Largest being 9 mm in size.  Repeat upper endoscopy in March 2005 showed gastric erythema at the site of the previously healed ulcers.  Patient was apparently FOBT positive in October 2015 but did not have a colonoscopy around that time.  A CT scan in 2016 reported edema of the duodenal bulb and second and third portions of the duodenum was treated with presumed infectious process with Cipro at that time.   Past Medical History:  Diagnosis Date  . Abnormal mammogram 2007   Recommend close follow up  . Anemia    Iron, b12, SPEP, UPEP normal, 12/2012  . Anxiety   . Aortic stenosis  mild to moderate  . aortic stenosis   . Arthritis   . Asthma   . CAD (coronary artery disease)    s/p angioplasty 1996  . Chronic cough   . Depression   . Diverticulosis   . Dyspnea   . Elevated LFTs 2010   s/p ultrasound w/ possible fatty liver; GI consult. Resolution 2011  . Gastric ulcer   . GERD (gastroesophageal reflux disease)   . History of colonic polyps    Dr. Vira Agar  . HOH (hard of hearing)   . Hyperlipidemia   . Hypertension   . Peritoneal  carcinomatosis [facility]) 2001   Dr. Oliva Bustard & Dr. Claiborne Rigg s/p chemo  . Shingles 2008   T-9 distribution  . Valvular heart disease    Mild to Moderate MR, AS  . Vitamin D deficiency   . Wheezing     Past Surgical History:  Procedure Laterality Date  . ABDOMINAL HYSTERECTOMY  2001  . APPENDECTOMY    . CARDIAC CATHETERIZATION  2007   50% mid LAD stenosis, 70% proximal RCA with 100% distal RCA stenosis with collaterals, EF 65%  . CARDIAC CATHETERIZATION N/A 04/27/2015   Procedure: Left Heart Cath and Coronary Angiography;  Surgeon: Jettie Booze, MD;  Location: [place] CV LAB;  Service: Cardiovascular;  Laterality: N/A;  . CARDIAC CATHETERIZATION N/A 04/27/2015   Procedure: Coronary Stent Intervention;  Surgeon: Jettie Booze, MD;  Location: [place] CV LAB;  Service: Cardiovascular;  Laterality: N/A;  . CATARACT EXTRACTION W/PHACO Left 09/25/2016   Procedure: CATARACT EXTRACTION PHACO AND INTRAOCULAR LENS PLACEMENT (IOC);  Surgeon: Eulogio Bear, MD;  Location: [facility] ORS;  Service: Ophthalmology;  Laterality: Left;  [place] 46.7 AP% 8.1 CDE 3.80 Fluid pack lot # [id number]  . CATARACT EXTRACTION W/PHACO Right 11/06/2016   Procedure: CATARACT EXTRACTION PHACO AND INTRAOCULAR LENS PLACEMENT ([place]);  Surgeon: Eulogio Bear, MD;  Location: [facility] ORS;  Service: Ophthalmology;  Laterality: Right;  [place] 49.5 AP8.7 CDE4.28 LOT # [id number]  . CHOLECYSTECTOMY  1987  . COLECTOMY  2001  . CORONARY ANGIOPLASTY  1996  . CORONARY ANGIOPLASTY    . LAPAROSCOPIC BILATERAL SALPINGO OOPHERECTOMY  1988   Dr. Laverta Baltimore  . LEFT HEART CATH AND CORONARY ANGIOGRAPHY N/A 01/25/2018   Procedure: LEFT HEART CATH AND CORONARY ANGIOGRAPHY;  Surgeon: Wellington Hampshire, MD;  Location: [place] CV LAB;  Service: Cardiovascular;  Laterality: N/A;  . TONSILLECTOMY  1950    Prior to Admission medications   Medication Sig Start Date End Date Taking? Authorizing Provider  acetaminophen (TYLENOL) 650 MG CR  tablet Take 1,000 mg by mouth 2 (two) times daily.    Yes [provider]  albuterol (VENTOLIN HFA) 108 (90 Base) MCG/ACT inhaler Inhale 2 puffs into the lungs every 4 (four) hours as needed for wheezing or shortness of breath. 08/19/19  Yes [name], MD  aspirin 81 MG tablet Take 81 mg by mouth daily.   Yes [provider]  atorvastatin (LIPITOR) 80 MG tablet Take 0.5 tablets (40 mg total) by mouth daily at 6 PM. 11/10/18  Yes Nahser, Wonda Cheng, MD  calcium gluconate 650 MG tablet Take 650 mg by mouth 2 (two) times daily.   Yes [provider]  Cholecalciferol 5000 units TABS Take 5,000 Units by mouth daily.   Yes [provider]  citalopram (CELEXA) 20 MG tablet TAKE 1 TABLET(20 MG) BY MOUTH DAILY 09/15/19  Yes [name], MD  clopidogrel (PLAVIX) 75  MG tablet TAKE 1 TABLET BY MOUTH DAILY WITH BREAKFAST 03/17/19  Yes Wellington Hampshire, MD  isosorbide mononitrate (IMDUR) 30 MG 24 hr tablet TAKE 1 TABLET(30 MG) BY MOUTH DAILY 03/17/19  Yes Wellington Hampshire, MD  metoprolol tartrate (LOPRESSOR) 50 MG tablet Take 1 tablet (50 mg total) by mouth 2 (two) times daily. 02/28/19  Yes Wellington Hampshire, MD  mirtazapine (REMERON) 7.5 MG tablet qhs 06/15/19  Yes McLean-Scocuzza, Nino Glow, MD  Multiple Vitamin (MULTIVITAMIN) capsule Take 1 capsule by mouth daily.   Yes [provider]  nitroGLYCERIN (NITROSTAT) 0.4 MG SL tablet PLACE 1 TABLET UNDER THE TONGUE EVERY 5 MINUTES AS NEEDED FOR CHEST PAIN, 3 DOSES MAX 05/02/16  Yes Nahser, Wonda Cheng, MD  omega-3 acid ethyl esters (LOVAZA) 1 g capsule Take 2 capsules (2 g total) by mouth 2 (two) times daily. 06/15/19  Yes [name], MD  pantoprazole (PROTONIX) 40 MG tablet Take 1 tablet (40 mg total) by mouth daily. 09/13/19  Yes Wellington Hampshire, MD  vitamin B-12 (CYANOCOBALAMIN) 250 MCG tablet Take 250 mcg by mouth daily.   Yes [provider]    Family History  Problem Relation Age of Onset   . Hypertension Mother   . Stroke Mother 46       Cerebral Hemorrhage  . Cancer Father        bone cancer  . Diabetes Sister   . Diabetes Brother   . Heart disease Brother   . Cancer Daughter 5       Ovarian cancer  . Colon cancer Neg Hx   . Colon polyps Neg Hx      Social History   Tobacco Use  . Smoking status: Never Smoker  . Smokeless tobacco: Never Used  Substance Use Topics  . Alcohol use: No    Alcohol/week: 0.0 standard drinks  . Drug use: No    Allergies as of 11/15/2019 - Review Complete 11/15/2019  Allergen Reaction Noted  . Abilify [aripiprazole] Other (See Comments) 11/11/2013    Review of Systems:    All systems reviewed and negative except where noted in HPI.   Observations/Objective:  Labs: CBC    Component Value Date/Time   WBC 7.3 11/07/2019 1134   RBC 3.74 (L) 11/07/2019 1134   HGB 12.1 11/07/2019 1134   HGB 10.5 (L) 04/25/2015 1202   HCT 35.4 (L) 11/07/2019 1134   HCT 32.2 (L) 04/25/2015 1202   PLT 159.0 11/07/2019 1134   PLT 178 04/25/2015 1202   MCV 94.6 11/07/2019 1134   MCV 89 04/25/2015 1202   MCV 91 11/17/2014 2031   MCH 32.3 01/22/2018 1838   MCHC 34.1 11/07/2019 1134   RDW 12.5 11/07/2019 1134   RDW 15.6 (H) 04/25/2015 1202   RDW 15.4 (H) 11/17/2014 2031   LYMPHSABS 1.9 09/24/2015 0943   LYMPHSABS 1.5 11/17/2014 2031   MONOABS 0.5 09/24/2015 0943   MONOABS 0.5 11/17/2014 2031   EOSABS 0.3 09/24/2015 0943   EOSABS 0.1 11/17/2014 2031   BASOSABS 0.0 09/24/2015 0943   BASOSABS 0.0 11/17/2014 2031   CMP     Component Value Date/Time   NA 129 (L) 11/07/2019 1134   NA 137 09/03/2017 1049   NA 135 11/17/2014 2031   K 4.1 11/07/2019 1134   K 3.5 11/17/2014 2031   CL 96 11/07/2019 1134   CL 101 11/17/2014 2031   CO2 23 11/07/2019 1134   CO2 23 11/17/2014 2031   GLUCOSE 141 (H)  11/07/2019 1134   GLUCOSE 119 (H) 11/17/2014 2031   BUN 15 11/07/2019 1134   BUN 15 09/03/2017 1049   BUN 15 11/17/2014 2031   CREATININE  0.74 11/07/2019 1134   CREATININE 0.73 04/28/2016 1003   CALCIUM 9.5 11/07/2019 1134   CALCIUM 9.2 11/17/2014 2031   PROT 7.1 11/07/2019 1134   PROT 7.4 09/03/2017 1049   PROT 7.7 11/17/2014 2031   ALBUMIN 4.4 11/07/2019 1134   ALBUMIN 4.8 (H) 09/03/2017 1049   ALBUMIN 3.9 11/17/2014 2031   AST 30 11/07/2019 1134   AST 40 11/17/2014 2031   ALT 23 11/07/2019 1134   ALT 27 11/17/2014 2031   ALKPHOS 45 11/07/2019 1134   ALKPHOS 44 11/17/2014 2031   BILITOT 0.6 11/07/2019 1134   BILITOT 0.6 09/03/2017 1049   BILITOT 0.3 11/17/2014 2031   GFRNONAA >60 03/14/2019 1247   GFRNONAA >60 11/17/2014 2031   GFRAA >60 03/14/2019 1247   GFRAA >60 11/17/2014 2031    Imaging Studies: No results found.  Assessment and Plan:   Amy Morton is a 85 y.o. y/o female has been referred for chronic diarrhea and intermittent fecal incontinence  Assessment and Plan: Patient symptoms may be related to pelvic floor dysfunction given fecal incontinence also present  Metamucil is helping her symptoms by bulking her stool  I discussed the option of proceeding with colonoscopy to rule out microscopic colitis given that she also has hypomagnesemia and hyponatremia from her symptoms.  Her and her family would like to wait to see how the Metamucil works since it has already helped her symptoms before proceeding with a colonoscopy  Follow-up in clinic in 2 to 3 weeks to also allow rectal exam to assess rectal tone  I also discussed that her FOBT was positive in 2015 and a follow-up colonoscopy was never done.  Albeit she had a colonoscopy in 2013 and therefore the risk of malignancy in 2015 would have been low  I also discussed the abnormal finding of duodenitis on her CT scan in 2016 and without endoscopic view of this area, ulcers or malignancy cannot be ruled out.  However, she is not having any abdominal pain, or obstructive symptoms.  Patient would like to wait to see if Metamucil resolves all her  symptoms before proceeding with any endoscopic procedures  If endoscopy is needed patient will need cardiology clearance  Follow Up Instructions: In 2 to 3 weeks in person  I discussed the assessment and treatment plan with the patient. The patient was provided an opportunity to ask questions and all were answered. The patient agreed with the plan and demonstrated an understanding of the instructions.   The patient was advised to call back or seek an in-person evaluation if the symptoms worsen or if the condition fails to improve as anticipated.  I provided 15 minutes of face-to-face time via video software during this encounter.  Additional time was spent in reviewing patient's chart, placing orders etc.   Virgel Manifold, MD  Speech recognition software was used to dictate the above note.

## 2019-11-15 NOTE — Telephone Encounter (Signed)
Per Dr. Bonna Gains patient needed 2-3 week follow up appointment with her in person.  Called and left a message for call back to scheduled

## 2019-11-15 NOTE — Telephone Encounter (Signed)
Made patient a appointment for 12/14/2019

## 2019-11-17 ENCOUNTER — Encounter: Payer: Self-pay | Admitting: Family Medicine

## 2019-11-17 ENCOUNTER — Other Ambulatory Visit: Payer: Self-pay

## 2019-11-17 ENCOUNTER — Other Ambulatory Visit (INDEPENDENT_AMBULATORY_CARE_PROVIDER_SITE_OTHER): Payer: Medicare Other

## 2019-11-17 DIAGNOSIS — E871 Hypo-osmolality and hyponatremia: Secondary | ICD-10-CM

## 2019-11-17 LAB — BASIC METABOLIC PANEL
BUN: 12 mg/dL (ref 6–23)
CO2: 28 mEq/L (ref 19–32)
Calcium: 9.3 mg/dL (ref 8.4–10.5)
Chloride: 96 mEq/L (ref 96–112)
Creatinine, Ser: 0.71 mg/dL (ref 0.40–1.20)
GFR: 78.7 mL/min (ref >60.00)
Glucose, Bld: 117 mg/dL — ABNORMAL HIGH (ref 70–99)
Potassium: 3.9 mEq/L (ref 3.5–5.1)
Sodium: 133 mEq/L — ABNORMAL LOW (ref 135–145)

## 2019-11-17 LAB — MAGNESIUM: Magnesium: 1.5 mg/dL (ref 1.5–2.5)

## 2019-11-21 ENCOUNTER — Ambulatory Visit: Payer: Medicare Other | Admitting: Family Medicine

## 2019-11-21 ENCOUNTER — Other Ambulatory Visit: Payer: Self-pay

## 2019-11-21 MED ORDER — ATORVASTATIN CALCIUM 80 MG PO TABS: 40.0000 mg | ORAL_TABLET | Freq: Every day | ORAL | 1 refills | Status: DC

## 2019-11-21 NOTE — Telephone Encounter (Signed)
Pt now sees Dr. Fletcher Anon in [place]. Please address

## 2019-11-23 ENCOUNTER — Encounter: Payer: Self-pay | Admitting: Gastroenterology

## 2019-11-24 ENCOUNTER — Other Ambulatory Visit: Payer: Self-pay

## 2019-11-24 ENCOUNTER — Telehealth (INDEPENDENT_AMBULATORY_CARE_PROVIDER_SITE_OTHER): Payer: Medicare Other | Admitting: Gastroenterology

## 2019-11-24 ENCOUNTER — Telehealth: Payer: Self-pay

## 2019-11-24 ENCOUNTER — Encounter: Payer: Self-pay | Admitting: Gastroenterology

## 2019-11-24 DIAGNOSIS — R159 Full incontinence of feces: Secondary | ICD-10-CM | POA: Diagnosis not present

## 2019-11-24 MED ORDER — NITROGLYCERIN 0.4 MG SL SUBL: SUBLINGUAL_TABLET | SUBLINGUAL | 1 refills | Status: DC

## 2019-11-24 NOTE — Telephone Encounter (Signed)
*  STAT* If patient is at the pharmacy, call can be transferred to refill team.   1. Which medications need to be refilled? (please list name of each medication and dose if known) Nitroglycerin  2. Which pharmacy/location (including street and city if local pharmacy) is medication to be sent to? [address]  3. Do they need a 30 day or 90 day supply?

## 2019-11-24 NOTE — Telephone Encounter (Signed)
Referral was sent through Epic. Patient is aware.

## 2019-11-24 NOTE — Progress Notes (Signed)
Amy Antigua, MD [address]  [place]  [place], [place] [address]  Main: [phone]  Fax: [phone]   Primary Care Physician: [name], MD  Virtual Visit via Telephone Note  I connected with patient on 11/24/19 at 11:30 AM EDT by telephone and verified that I am speaking with the correct person using two identifiers.   I discussed the limitations, risks, security and privacy concerns of performing an evaluation and management service by telephone and the availability of in person appointments. I also discussed with the patient that there may be a patient responsible charge related to this service. The patient expressed understanding and agreed to proceed.  Location of Patient: Home Location of Provider: Home Persons involved: Patient, her sister, and provider only during the visit (nursing staff and front desk staff was involved in communicating with the patient prior to the appointment, reviewing medications and checking them in)   History of Present Illness: Chief Complaint  Patient presents with  . Diarrhea    Patient stated that she continues to have loose stools w/ rectal bleeding; no abd pain     HPI: Amy Morton is a 85 y.o. female here for follow-up of fecal incontinence ongoing for years.  Was recommended Metamucil on last visit but patient states this has not led to any difference and she is continuing to have symptoms.  History of 3 vaginal deliveries in the past.  Infectious GI profile in August 2020 was negative.  The patient denies abdominal or flank pain, anorexia, nausea or vomiting, dysphagia, change in bowel habits or black or bloody stools or weight loss.  Previous history: Pt Previously evaluated by GI in 2016, Dr. Henrene Pastor, and Dr. Vira Agar in 2013.  Remote history of peritoneal cancer and reportedly had one third of her colon removed in 2001.  Colonoscopy for iron deficiency anemia in 2013 by Dr. Vira Agar reported diverticulosis  and hemorrhoids and small solitary ulcer at the anastomosis.  Colon was reported to be tortuous due to previous surgery.  Extent of exam was to the ileocolonic anastomosis.  Upper endoscopy showed duodenal mucosal atrophy in the second portion.  Hiatal hernia.  Normal stomach.  Biopsies showed chronic duodenitis.  She also had 2 upper endoscopies in 2005.  Initial one was in January 2005 for melena that showed 2 clean base gastric ulcers in the antrum.  Largest being 9 mm in size.  Repeat upper endoscopy in March 2005 showed gastric erythema at the site of the previously healed ulcers.  Patient was apparently FOBT positive in October 2015 but did not have a colonoscopy around that time.  A CT scan in 2016 reported edema of the duodenal bulb and second and third portions of the duodenum was treated with presumed infectious process with Cipro at that time.  Current Outpatient Medications  Medication Sig Dispense Refill  . acetaminophen (TYLENOL) 650 MG CR tablet Take 1,000 mg by mouth 2 (two) times daily.     Marland Kitchen albuterol (VENTOLIN HFA) 108 (90 Base) MCG/ACT inhaler Inhale 2 puffs into the lungs every 4 (four) hours as needed for wheezing or shortness of breath. 18 g 0  . aspirin 81 MG tablet Take 81 mg by mouth daily.    Marland Kitchen atorvastatin (LIPITOR) 80 MG tablet Take 0.5 tablets (40 mg total) by mouth daily at 6 PM. 45 tablet 1  . Cholecalciferol 5000 units TABS Take 5,000 Units by mouth daily.    . citalopram (CELEXA) 20 MG tablet TAKE  1 TABLET(20 MG) BY MOUTH DAILY 90 tablet 0  . clopidogrel (PLAVIX) 75 MG tablet TAKE 1 TABLET BY MOUTH DAILY WITH BREAKFAST 90 tablet 3  . isosorbide mononitrate (IMDUR) 30 MG 24 hr tablet TAKE 1 TABLET(30 MG) BY MOUTH DAILY 90 tablet 2  . metoprolol tartrate (LOPRESSOR) 50 MG tablet Take 1 tablet (50 mg total) by mouth 2 (two) times daily. 180 tablet 3  . mirtazapine (REMERON) 7.5 MG tablet qhs 90 tablet 0  . Multiple Vitamin (MULTIVITAMIN) capsule Take 1 capsule by  mouth daily.    . Niacin (VITAMIN B-3 PO) Take 1 tablet by mouth 1 day or 1 dose.    . omega-3 acid ethyl esters (LOVAZA) 1 g capsule Take 2 capsules (2 g total) by mouth 2 (two) times daily. 360 capsule 1  . pantoprazole (PROTONIX) 40 MG tablet Take 1 tablet (40 mg total) by mouth daily. 90 tablet 0  . vitamin B-12 (CYANOCOBALAMIN) 250 MCG tablet Take 250 mcg by mouth daily.    . calcium gluconate 650 MG tablet Take 650 mg by mouth 2 (two) times daily.    . nitroGLYCERIN (NITROSTAT) 0.4 MG SL tablet PLACE 1 TABLET UNDER THE TONGUE EVERY 5 MINUTES AS NEEDED FOR CHEST PAIN, 3 DOSES MAX 25 tablet 1   No current facility-administered medications for this visit.   Facility-Administered Medications Ordered in Other Visits  Medication Dose Route Frequency Provider Last Rate Last Admin  . sodium chloride 0.9 % nebulizer solution 3 mL  3 mL Nebulization Once [name], MD       Followed by  . methacholine (PROVOCHOLINE) inhaler solution 0.125 mg  2 mL Inhalation Once [name], MD       Followed by  . methacholine (PROVOCHOLINE) inhaler solution 0.5 mg  2 mL Inhalation Once [name], MD       Followed by  . methacholine (PROVOCHOLINE) inhaler solution 2 mg  2 mL Inhalation Once [name], MD       Followed by  . methacholine (PROVOCHOLINE) inhaler solution 8 mg  2 mL Inhalation Once [name], MD       Followed by  . methacholine (PROVOCHOLINE) inhaler solution 32 mg  2 mL Inhalation Once [name], MD        Allergies as of 11/24/2019 - Review Complete 11/24/2019  Allergen Reaction Noted  . Abilify [aripiprazole] Other (See Comments) 11/11/2013    Review of Systems:    All systems reviewed and negative except where noted in HPI.   Observations/Objective:  Labs: CMP     Component Value Date/Time   NA 133 (L) 11/17/2019 0856   NA 137 09/03/2017 1049   NA 135 11/17/2014 2031   K 3.9 11/17/2019 0856   K 3.5 11/17/2014 2031   CL  96 11/17/2019 0856   CL 101 11/17/2014 2031   CO2 28 11/17/2019 0856   CO2 23 11/17/2014 2031   GLUCOSE 117 (H) 11/17/2019 0856   GLUCOSE 119 (H) 11/17/2014 2031   BUN 12 11/17/2019 0856   BUN 15 09/03/2017 1049   BUN 15 11/17/2014 2031   CREATININE 0.71 11/17/2019 0856   CREATININE 0.73 04/28/2016 1003   CALCIUM 9.3 11/17/2019 0856   CALCIUM 9.2 11/17/2014 2031   PROT 7.1 11/07/2019 1134   PROT 7.4 09/03/2017 1049   PROT 7.7 11/17/2014 2031   ALBUMIN 4.4 11/07/2019 1134   ALBUMIN 4.8 (H) 09/03/2017 1049   ALBUMIN 3.9 11/17/2014 2031  AST 30 11/07/2019 1134   AST 40 11/17/2014 2031   ALT 23 11/07/2019 1134   ALT 27 11/17/2014 2031   ALKPHOS 45 11/07/2019 1134   ALKPHOS 44 11/17/2014 2031   BILITOT 0.6 11/07/2019 1134   BILITOT 0.6 09/03/2017 1049   BILITOT 0.3 11/17/2014 2031   GFRNONAA >60 03/14/2019 1247   GFRNONAA >60 11/17/2014 2031   GFRAA >60 03/14/2019 1247   GFRAA >60 11/17/2014 2031   Lab Results  Component Value Date   WBC 7.3 11/07/2019   HGB 12.1 11/07/2019   HCT 35.4 (L) 11/07/2019   MCV 94.6 11/07/2019   PLT 159.0 11/07/2019    Imaging Studies: No results found.  Assessment and Plan:   Amy Morton is a 85 y.o. y/o female here for follow-up of fecal incontinence ongoing for 3 to 4 years  Assessment and Plan: Symptoms are likely due to clinical incontinence and not true diarrhea.  Patient willing to consider pelvic floor physical therapy referral.  Will place.  If symptoms are not better with this, she is willing to consider colonoscopy.  We will need cardiac clearance prior to procedure, and upper endoscopy can be done at the same time to follow-up on above findings from 2016 CT scan  Follow Up Instructions:    I discussed the assessment and treatment plan with the patient. The patient was provided an opportunity to ask questions and all were answered. The patient agreed with the plan and demonstrated an understanding of the  instructions.   The patient was advised to call back or seek an in-person evaluation if the symptoms worsen or if the condition fails to improve as anticipated.  I provided 12 minutes of non-face-to-face time during this encounter. Additional time was spent in reviewing patient's chart, placing orders etc.   Virgel Manifold, MD  Speech recognition software was used to dictate this note.

## 2019-11-24 NOTE — Addendum Note (Signed)
Addended by: Wayna Chalet on: 11/24/2019 01:15 PM   Modules accepted: Orders

## 2019-12-06 NOTE — Telephone Encounter (Signed)
Called the [facility] to make sure that they had received our referral. I was told that they will contact the patient to schedule the appointment.

## 2019-12-12 ENCOUNTER — Other Ambulatory Visit: Payer: Self-pay | Admitting: Cardiovascular Disease

## 2019-12-12 ENCOUNTER — Other Ambulatory Visit: Payer: Self-pay | Admitting: Family Medicine

## 2019-12-12 DIAGNOSIS — F419 Anxiety disorder, unspecified: Secondary | ICD-10-CM

## 2019-12-14 ENCOUNTER — Ambulatory Visit: Payer: Medicare Other | Admitting: Gastroenterology

## 2019-12-15 ENCOUNTER — Other Ambulatory Visit: Payer: Self-pay

## 2019-12-15 ENCOUNTER — Encounter: Payer: Self-pay | Admitting: Gastroenterology

## 2019-12-15 ENCOUNTER — Ambulatory Visit (INDEPENDENT_AMBULATORY_CARE_PROVIDER_SITE_OTHER): Payer: Medicare Other | Admitting: Gastroenterology

## 2019-12-15 VITALS — BP 169/78 | HR 71 | Temp 98.1°F | Wt 142.4 lb

## 2019-12-15 DIAGNOSIS — R197 Diarrhea, unspecified: Secondary | ICD-10-CM

## 2019-12-15 DIAGNOSIS — C786 Secondary malignant neoplasm of retroperitoneum and peritoneum: Secondary | ICD-10-CM | POA: Insufficient documentation

## 2019-12-15 DIAGNOSIS — I10 Essential (primary) hypertension: Secondary | ICD-10-CM | POA: Insufficient documentation

## 2019-12-15 DIAGNOSIS — J45909 Unspecified asthma, uncomplicated: Secondary | ICD-10-CM | POA: Insufficient documentation

## 2019-12-15 NOTE — Patient Instructions (Signed)
Please call the [facility] back and schedule your appointment at [phone].  Please take Imodium every other day to help you with your diarrhea.

## 2019-12-15 NOTE — Telephone Encounter (Signed)
Called [facility] rehab in reference to patient's referral and I was told that they had reached out to the patient and that the patient told them that they were going to wait until they saw our doctor and decide if they needed the appointment or not. When I mentioned this to the patient, she did not know what I was saying. Therefore, I told the patient to please call [facility] and schedule an appointment. Patient stated that she would call them. The patient was provided with their number.

## 2019-12-16 ENCOUNTER — Emergency Department: Payer: Medicare Other

## 2019-12-16 ENCOUNTER — Other Ambulatory Visit: Payer: Self-pay

## 2019-12-16 ENCOUNTER — Emergency Department
Admission: EM | Admit: 2019-12-16 | Discharge: 2019-12-17 | Disposition: A | Discharge status: Home / Self Care | Payer: Medicare Other | Attending: Emergency Medicine | Admitting: Emergency Medicine

## 2019-12-16 DIAGNOSIS — I2510 Atherosclerotic heart disease of native coronary artery without angina pectoris: Secondary | ICD-10-CM | POA: Insufficient documentation

## 2019-12-16 DIAGNOSIS — J449 Chronic obstructive pulmonary disease, unspecified: Secondary | ICD-10-CM | POA: Insufficient documentation

## 2019-12-16 DIAGNOSIS — E871 Hypo-osmolality and hyponatremia: Secondary | ICD-10-CM | POA: Insufficient documentation

## 2019-12-16 DIAGNOSIS — Z7982 Long term (current) use of aspirin: Secondary | ICD-10-CM | POA: Insufficient documentation

## 2019-12-16 DIAGNOSIS — I10 Essential (primary) hypertension: Secondary | ICD-10-CM | POA: Insufficient documentation

## 2019-12-16 DIAGNOSIS — C482 Malignant neoplasm of peritoneum, unspecified: Secondary | ICD-10-CM | POA: Insufficient documentation

## 2019-12-16 DIAGNOSIS — Z79899 Other long term (current) drug therapy: Secondary | ICD-10-CM | POA: Insufficient documentation

## 2019-12-16 DIAGNOSIS — Z7901 Long term (current) use of anticoagulants: Secondary | ICD-10-CM | POA: Insufficient documentation

## 2019-12-16 DIAGNOSIS — R42 Dizziness and giddiness: Secondary | ICD-10-CM | POA: Insufficient documentation

## 2019-12-16 LAB — TROPONIN I (HIGH SENSITIVITY)
Troponin I (High Sensitivity): 10 ng/L (ref <18)
Troponin I (High Sensitivity): 10 ng/L (ref <18)

## 2019-12-16 LAB — URINALYSIS, COMPLETE (UACMP) WITH MICROSCOPIC
Bacteria, UA: NONE SEEN
Bilirubin Urine: NEGATIVE
Glucose, UA: NEGATIVE mg/dL
Hgb urine dipstick: NEGATIVE
Ketones, ur: NEGATIVE mg/dL
Leukocytes,Ua: NEGATIVE
Nitrite: NEGATIVE
Protein, ur: NEGATIVE mg/dL
Specific Gravity, Urine: 1.003 — ABNORMAL LOW (ref 1.005–1.030)
pH: 8 (ref 5.0–8.0)

## 2019-12-16 LAB — CBC
HCT: 32.8 % — ABNORMAL LOW (ref 36.0–46.0)
Hemoglobin: 11.1 g/dL — ABNORMAL LOW (ref 12.0–15.0)
MCH: 31.4 pg (ref 26.0–34.0)
MCHC: 33.8 g/dL (ref 30.0–36.0)
MCV: 92.7 fL (ref 80.0–100.0)
Platelets: 178 10*3/uL (ref 150–400)
RBC: 3.54 MIL/uL — ABNORMAL LOW (ref 3.87–5.11)
RDW: 11.6 % (ref 11.5–15.5)
WBC: 7.3 10*3/uL (ref 4.0–10.5)
nRBC: 0 % (ref 0.0–0.2)

## 2019-12-16 LAB — BASIC METABOLIC PANEL
Anion gap: 9 (ref 5–15)
BUN: 13 mg/dL (ref 8–23)
CO2: 25 mmol/L (ref 22–32)
Calcium: 9.1 mg/dL (ref 8.9–10.3)
Chloride: 93 mmol/L — ABNORMAL LOW (ref 98–111)
Creatinine, Ser: 0.68 mg/dL (ref 0.44–1.00)
GFR calc Af Amer: >60 mL/min (ref >60)
GFR calc non Af Amer: >60 mL/min (ref >60)
Glucose, Bld: 130 mg/dL — ABNORMAL HIGH (ref 70–99)
Potassium: 4.1 mmol/L (ref 3.5–5.1)
Sodium: 127 mmol/L — ABNORMAL LOW (ref 135–145)

## 2019-12-16 MED ORDER — SODIUM CHLORIDE 0.9 % IV BOLUS
1000.0000 mL | Freq: Once | INTRAVENOUS | Status: AC
  Administered 2019-12-16: 1000 mL via INTRAVENOUS

## 2019-12-16 NOTE — ED Triage Notes (Signed)
Dizziness that started on Wednesday, happens multiple times per day and pt has had a few falls from the dizziness. Left elbow skin tear. Unwitnessed fall. Denies LOC. Unsure if she hit her head. .Pt alert and oriented X4, cooperative, RR even and unlabored, color WNL. Pt in NAD.

## 2019-12-16 NOTE — ED Provider Notes (Signed)
[facility] Emergency Department Provider Note   ____________________________________________   I have reviewed the triage vital signs and the nursing notes.   HISTORY  Chief Complaint Dizziness and Fall   History limited by: Not Limited   HPI Amy Morton is a 85 y.o. female who presents to the emergency department today because of concerns for dizziness and a fall that occurred 2 days ago.  The patient states that she has been feeling dizzy even before the fall.  Has been going on for a couple weeks.  She states that it is intermittent she has not noticed any pattern.  She describes a sensation of feeling lightheaded.  She denies any significant chest discomfort when this happens.  The patient states she did have a fall 2 days ago and injured her left elbow. She denies any fevers. Has not noticed any change to her urination.   Records reviewed. Per medical record review patient has a history of anemia, CAD  Past Medical History:  Diagnosis Date  . Abnormal mammogram 2007   Recommend close follow up  . Anemia    Iron, b12, SPEP, UPEP normal, 12/2012  . Anxiety   . Aortic stenosis    mild to moderate  . aortic stenosis   . Arthritis   . Asthma   . CAD (coronary artery disease)    s/p angioplasty 1996  . Chronic cough   . Depression   . Diverticulosis   . Dyspnea   . Elevated LFTs 2010   s/p ultrasound w/ possible fatty liver; GI consult. Resolution 2011  . Gastric ulcer   . GERD (gastroesophageal reflux disease)   . History of colonic polyps    Dr. Vira Agar  . HOH (hard of hearing)   . Hyperlipidemia   . Hypertension   . Peritoneal carcinomatosis [facility]) 2001   Dr. Oliva Bustard & Dr. Claiborne Rigg s/p chemo  . Shingles 2008   T-9 distribution  . Valvular heart disease    Mild to Moderate MR, AS  . Vitamin D deficiency   . Wheezing     Patient Active Problem List   Diagnosis Date Noted  . Asthma without status asthmaticus 12/15/2019  .  Hypertension 12/15/2019  . Peritoneal carcinomatosis ([place]) 12/15/2019  . Prediabetes 02/11/2019  . Memory difficulty 02/08/2018  . Non-rheumatic mitral regurgitation 02/12/2017  . Orthostasis 10/20/2016  . Arthritis 05/22/2016  . COPD (chronic obstructive pulmonary disease) ([place]) 11/21/2015  . CAD (coronary artery disease) 04/27/2015  . Complex tear of medial meniscus of right knee as current injury 07/26/2014  . Primary osteoarthritis of right knee 07/26/2014  . Anemia 05/12/2014  . Nonrheumatic aortic valve stenosis 04/18/2014  . History of peritoneal carcinoma 04/17/2014  . Chronic diarrhea 04/17/2014  . Dyslipidemia, goal LDL below 70 04/17/2014  . Anxiety 11/13/2013  . Vitamin D deficiency 11/13/2013    Past Surgical History:  Procedure Laterality Date  . ABDOMINAL HYSTERECTOMY  2001  . APPENDECTOMY    . CARDIAC CATHETERIZATION  2007   50% mid LAD stenosis, 70% proximal RCA with 100% distal RCA stenosis with collaterals, EF 65%  . CARDIAC CATHETERIZATION N/A 04/27/2015   Procedure: Left Heart Cath and Coronary Angiography;  Surgeon: Jettie Booze, MD;  Location: [facility];  Service: Cardiovascular;  Laterality: N/A;  . CARDIAC CATHETERIZATION N/A 04/27/2015   Procedure: Coronary Stent Intervention;  Surgeon: Jettie Booze, MD;  Location: [facility];  Service: Cardiovascular;  Laterality: N/A;  . CATARACT  EXTRACTION W/PHACO Left 09/25/2016   Procedure: CATARACT EXTRACTION PHACO AND INTRAOCULAR LENS PLACEMENT (IOC);  Surgeon: Eulogio Bear, MD;  Location: [facility];  Service: Ophthalmology;  Laterality: Left;  [place] 46.7 AP% 8.1 CDE 3.80 Fluid pack lot # [id number]  . CATARACT EXTRACTION W/PHACO Right 11/06/2016   Procedure: CATARACT EXTRACTION PHACO AND INTRAOCULAR LENS PLACEMENT ([place]);  Surgeon: Eulogio Bear, MD;  Location: [facility];  Service: Ophthalmology;  Laterality: Right;  [place] 49.5 AP8.7 CDE4.28 LOT # [id number]  . CHOLECYSTECTOMY  1987  .  COLECTOMY  2001  . CORONARY ANGIOPLASTY  1996  . CORONARY ANGIOPLASTY    . LAPAROSCOPIC BILATERAL SALPINGO OOPHERECTOMY  1988   Dr. Laverta Baltimore  . LEFT HEART CATH AND CORONARY ANGIOGRAPHY N/A 01/25/2018   Procedure: LEFT HEART CATH AND CORONARY ANGIOGRAPHY;  Surgeon: Wellington Hampshire, MD;  Location: [facility];  Service: Cardiovascular;  Laterality: N/A;  . TONSILLECTOMY  1950    Prior to Admission medications   Medication Sig Start Date End Date Taking? Authorizing Provider  acetaminophen (TYLENOL) 650 MG CR tablet Take 1,000 mg by mouth 2 (two) times daily.     [provider]  albuterol (VENTOLIN HFA) 108 (90 Base) MCG/ACT inhaler Inhale 2 puffs into the lungs every 4 (four) hours as needed for wheezing or shortness of breath. 08/19/19   [name], MD  aspirin 81 MG tablet Take 81 mg by mouth daily.    [provider]  atorvastatin (LIPITOR) 80 MG tablet Take 0.5 tablets (40 mg total) by mouth daily at 6 PM. 11/21/19   Wellington Hampshire, MD  calcium gluconate 650 MG tablet Take 650 mg by mouth 2 (two) times daily.    [provider]  Cholecalciferol 5000 units TABS Take 5,000 Units by mouth daily.    [provider]  citalopram (CELEXA) 20 MG tablet TAKE 1 TABLET(20 MG) BY MOUTH DAILY 12/12/19   Einar Pheasant, MD  clopidogrel (PLAVIX) 75 MG tablet TAKE 1 TABLET BY MOUTH DAILY WITH BREAKFAST 03/17/19   Wellington Hampshire, MD  isosorbide mononitrate (IMDUR) 30 MG 24 hr tablet TAKE 1 TABLET(30 MG) BY MOUTH DAILY 12/12/19   Wellington Hampshire, MD  metoprolol tartrate (LOPRESSOR) 50 MG tablet TAKE 1 TABLET(50 MG) BY MOUTH TWICE DAILY 12/12/19   Wellington Hampshire, MD  mirtazapine (REMERON) 7.5 MG tablet qhs 06/15/19   McLean-Scocuzza, Nino Glow, MD  Multiple Vitamin (MULTIVITAMIN) capsule Take 1 capsule by mouth daily.    [provider]  Niacin (VITAMIN B-3 PO) Take 1 tablet by mouth 1 day or 1 dose.    [provider]  nitroGLYCERIN  (NITROSTAT) 0.4 MG SL tablet PLACE 1 TABLET UNDER THE TONGUE EVERY 5 MINUTES AS NEEDED FOR CHEST PAIN, 3 DOSES MAX Patient not taking: Reported on 12/15/2019 11/24/19   Wellington Hampshire, MD  omega-3 acid ethyl esters (LOVAZA) 1 g capsule TAKE 2 CAPSULES BY MOUTH TWICE DAILY 12/12/19   Einar Pheasant, MD  pantoprazole (PROTONIX) 40 MG tablet TAKE 1 TABLET(40 MG) BY MOUTH DAILY 12/12/19   Wellington Hampshire, MD  vitamin B-12 (CYANOCOBALAMIN) 250 MCG tablet Take 250 mcg by mouth daily.    [provider]    Allergies Abilify [aripiprazole]  Family History  Problem Relation Age of Onset  . Hypertension Mother   . Stroke Mother 45       Cerebral Hemorrhage  . Cancer Father        bone cancer  .  Diabetes Sister   . Diabetes Brother   . Heart disease Brother   . Cancer Daughter 55       Ovarian cancer  . Colon cancer Neg Hx   . Colon polyps Neg Hx     Social History Social History   Tobacco Use  . Smoking status: Never Smoker  . Smokeless tobacco: Never Used  Substance Use Topics  . Alcohol use: No    Alcohol/week: 0.0 standard drinks  . Drug use: No    Review of Systems Constitutional: No fever/chills Eyes: No visual changes. ENT: No sore throat. Cardiovascular: Denies chest pain. Respiratory: Denies shortness of breath. Gastrointestinal: No abdominal pain.  No nausea, no vomiting.  No diarrhea.   Genitourinary: Negative for dysuria. Musculoskeletal: Negative for back pain. Skin: Positive for skin tear to left elbow.  Neurological: Positive for lightheadedness.  ____________________________________________   PHYSICAL EXAM:  VITAL SIGNS: ED Triage Vitals  Enc Vitals Group     BP 12/16/19 1830 (!) 187/96     Pulse Rate 12/16/19 1830 77     Resp 12/16/19 1830 18     Temp 12/16/19 1830 98.2 F (36.8 C)     Temp Source 12/16/19 1830 Oral     SpO2 12/16/19 1830 98 %     Weight 12/16/19 1826 142 lb (64.4 kg)     Height 12/16/19 1826 4\' 11"  (1.499 m)     Head  Circumference --      Peak Flow --      Pain Score 12/16/19 1826 3    Constitutional: Alert and oriented.  Eyes: Conjunctivae are normal.  ENT      Head: Normocephalic and atraumatic.      Nose: No congestion/rhinnorhea.      Mouth/Throat: Mucous membranes are moist.      Neck: No stridor. Hematological/Lymphatic/Immunilogical: No cervical lymphadenopathy. Cardiovascular: Normal rate, regular rhythm.  No murmurs, rubs, or gallops. Respiratory: Normal respiratory effort without tachypnea nor retractions. Breath sounds are clear and equal bilaterally. No wheezes/rales/rhonchi. Gastrointestinal: Soft and non tender. No rebound. No guarding.  Genitourinary: Deferred Musculoskeletal: Normal range of motion in all extremities. No lower extremity edema. Neurologic:  Normal speech and language. No gross focal neurologic deficits are appreciated.  Skin:  Skin tear to left elbow.  Psychiatric: Mood and affect are normal. Speech and behavior are normal. Patient exhibits appropriate insight and judgment.  ____________________________________________    LABS (pertinent positives/negatives)  Trop hs 10 CBC wbc 7.3, hgb 11.1, plt 178 BMP na 127, k 4.1, cl 93, glu 130, cr .68  ____________________________________________   EKG  I, Nance Pear, attending physician, personally viewed and interpreted this EKG  EKG Time: 1825 Rate: 79 Rhythm: normal sinus rhythm Axis: normal Intervals: qtc 417 QRS: narrow, q waves v1 ST changes: no st elevation Impression: abnormal ekg   ____________________________________________    RADIOLOGY  Left forearm No acute osseous injury  CT head/cervical spine No acute osseous injury or intracranial injury  ____________________________________________   PROCEDURES  Procedures  ____________________________________________   INITIAL IMPRESSION / ASSESSMENT AND PLAN / ED COURSE  Pertinent labs & imaging results that were available  during my care of the patient were reviewed by me and considered in my medical decision making (see chart for details).   Patient presented to the emergency department today because of concern for episodes of dizziness. This did result in a fall a couple of days ago. Blood work does show low sodium and chloride. No obvious infection. No  significant anemia. At this time do wonder if patient is dehydrated causing the lightheadedness. Was given IVFs and did feel better. Discussed with patient extreme importance of close follow up with PCP for recheck of her electrolytes.    ____________________________________________   FINAL CLINICAL IMPRESSION(S) / ED DIAGNOSES  Final diagnoses:  Dizziness  Hyponatremia     Note: This dictation was prepared with Dragon dictation. Any transcriptional errors that result from this process are unintentional     Nance Pear, MD 12/17/19 [phone]

## 2019-12-16 NOTE — Progress Notes (Signed)
Amy Antigua, MD [address]  [place]  [place], [place] [address]  Main: [phone]  Fax: [phone]   Primary Care Physician: [name], MD   Chief Complaint  Patient presents with  . Follow-up  . Diarrhea    Patient stated that she has 5-6 episodes of diarrhea daily. Patient denied abdominal pain, nausea or vomiting.    HPI: Amy Morton is a 85 y.o. female here for follow-up of fecal incontinence.  Pelvic floor physical therapy was recommended and we had contacted their office and sent on the referral.  Patient still does not have an appointment with them.  Takes Imodium when she has to leave the house and on those days her stools are formed.  Other than that, she has loose stools and has incontinence because of this.  No blood in stool  Was recommended Metamucil on last visit but patient states this has not led to any difference and she is continuing to have symptoms.  History of 3 vaginal deliveries in the past.  Infectious GI profile in August 2020 was negative.  The patient denies abdominal or flank pain, anorexia, nausea or vomiting, dysphagia, change in bowel habits or black or bloody stools or weight loss.  Previous history: Pt Previously evaluated by GI in 2016, Dr. Henrene Pastor, and Dr. Vira Agar in 2013.  Remote history of peritoneal cancer and reportedly had one third of her colon removed in 2001.  Colonoscopy for iron deficiency anemia in 2013 by Dr. Vira Agar reported diverticulosis and hemorrhoids and small solitary ulcer at the anastomosis.  Colon was reported to be tortuous due to previous surgery.  Extent of exam was to the ileocolonic anastomosis.  Upper endoscopy showed duodenal mucosal atrophy in the second portion.  Hiatal hernia.  Normal stomach.  Biopsies showed chronic duodenitis.  She also had 2 upper endoscopies in 2005.  Initial one was in January 2005 for melena that showed 2 clean base gastric ulcers in the antrum.  Largest being 9  mm in size.  Repeat upper endoscopy in March 2005 showed gastric erythema at the site of the previously healed ulcers.  Patient was apparently FOBT positive in October 2015 but did not have a colonoscopy around that time.  A CT scan in 2016 reported edema of the duodenal bulb and second and third portions of the duodenum was treated with presumed infectious process with Cipro at that time.  Current Outpatient Medications  Medication Sig Dispense Refill  . acetaminophen (TYLENOL) 650 MG CR tablet Take 1,000 mg by mouth 2 (two) times daily.     Marland Kitchen albuterol (VENTOLIN HFA) 108 (90 Base) MCG/ACT inhaler Inhale 2 puffs into the lungs every 4 (four) hours as needed for wheezing or shortness of breath. 18 g 0  . aspirin 81 MG tablet Take 81 mg by mouth daily.    Marland Kitchen atorvastatin (LIPITOR) 80 MG tablet Take 0.5 tablets (40 mg total) by mouth daily at 6 PM. 45 tablet 1  . Cholecalciferol 5000 units TABS Take 5,000 Units by mouth daily.    . citalopram (CELEXA) 20 MG tablet TAKE 1 TABLET(20 MG) BY MOUTH DAILY 90 tablet 0  . clopidogrel (PLAVIX) 75 MG tablet TAKE 1 TABLET BY MOUTH DAILY WITH BREAKFAST 90 tablet 3  . isosorbide mononitrate (IMDUR) 30 MG 24 hr tablet TAKE 1 TABLET(30 MG) BY MOUTH DAILY 90 tablet 2  . metoprolol tartrate (LOPRESSOR) 50 MG tablet TAKE 1 TABLET(50 MG) BY MOUTH TWICE DAILY 180 tablet 2  .  mirtazapine (REMERON) 7.5 MG tablet qhs 90 tablet 0  . Multiple Vitamin (MULTIVITAMIN) capsule Take 1 capsule by mouth daily.    . Niacin (VITAMIN B-3 PO) Take 1 tablet by mouth 1 day or 1 dose.    . omega-3 acid ethyl esters (LOVAZA) 1 g capsule TAKE 2 CAPSULES BY MOUTH TWICE DAILY 360 capsule 1  . pantoprazole (PROTONIX) 40 MG tablet TAKE 1 TABLET(40 MG) BY MOUTH DAILY 90 tablet 2  . vitamin B-12 (CYANOCOBALAMIN) 250 MCG tablet Take 250 mcg by mouth daily.    . calcium gluconate 650 MG tablet Take 650 mg by mouth 2 (two) times daily.    . nitroGLYCERIN (NITROSTAT) 0.4 MG SL tablet PLACE 1  TABLET UNDER THE TONGUE EVERY 5 MINUTES AS NEEDED FOR CHEST PAIN, 3 DOSES MAX (Patient not taking: Reported on 12/15/2019) 25 tablet 1   No current facility-administered medications for this visit.   Facility-Administered Medications Ordered in Other Visits  Medication Dose Route Frequency Provider Last Rate Last Admin  . sodium chloride 0.9 % nebulizer solution 3 mL  3 mL Nebulization Once [name], MD       Followed by  . methacholine (PROVOCHOLINE) inhaler solution 0.125 mg  2 mL Inhalation Once [name], MD       Followed by  . methacholine (PROVOCHOLINE) inhaler solution 0.5 mg  2 mL Inhalation Once [name], MD       Followed by  . methacholine (PROVOCHOLINE) inhaler solution 2 mg  2 mL Inhalation Once [name], MD       Followed by  . methacholine (PROVOCHOLINE) inhaler solution 8 mg  2 mL Inhalation Once [name], MD       Followed by  . methacholine (PROVOCHOLINE) inhaler solution 32 mg  2 mL Inhalation Once [name], MD        Allergies as of 12/15/2019 - Review Complete 12/15/2019  Allergen Reaction Noted  . Abilify [aripiprazole] Other (See Comments) 11/11/2013    ROS:  General: Negative for anorexia, weight loss, fever, chills, fatigue, weakness. ENT: Negative for hoarseness, difficulty swallowing , nasal congestion. CV: Negative for chest pain, angina, palpitations, dyspnea on exertion, peripheral edema.  Respiratory: Negative for dyspnea at rest, dyspnea on exertion, cough, sputum, wheezing.  GI: See history of present illness. GU:  Negative for dysuria, hematuria, urinary incontinence, urinary frequency, nocturnal urination.  Endo: Negative for unusual weight change.    Physical Examination:   BP (!) 169/78   Pulse 71   Temp 98.1 F (36.7 C) (Oral)   Wt 142 lb 6.4 oz (64.6 kg)   BMI 28.76 kg/m   General: Well-nourished, well-developed in no acute distress.  Eyes: No icterus. Conjunctivae  pink. Mouth: Oropharyngeal mucosa moist and pink , no lesions erythema or exudate. Neck: Supple, Trachea midline Abdomen: Bowel sounds are normal, nontender, nondistended, no hepatosplenomegaly or masses, no abdominal bruits or hernia , no rebound or guarding.   Extremities: No lower extremity edema. No clubbing or deformities. Neuro: Alert and oriented x 3.  Grossly intact. Skin: Warm and dry, no jaundice.   Psych: Alert and cooperative, normal mood and affect.   Labs: CMP     Component Value Date/Time   NA 133 (L) 11/17/2019 0856   NA 137 09/03/2017 1049   NA 135 11/17/2014 2031   K 3.9 11/17/2019 0856   K 3.5 11/17/2014 2031   CL 96 11/17/2019 0856   CL 101 11/17/2014 2031  CO2 28 11/17/2019 0856   CO2 23 11/17/2014 2031   GLUCOSE 117 (H) 11/17/2019 0856   GLUCOSE 119 (H) 11/17/2014 2031   BUN 12 11/17/2019 0856   BUN 15 09/03/2017 1049   BUN 15 11/17/2014 2031   CREATININE 0.71 11/17/2019 0856   CREATININE 0.73 04/28/2016 1003   CALCIUM 9.3 11/17/2019 0856   CALCIUM 9.2 11/17/2014 2031   PROT 7.1 11/07/2019 1134   PROT 7.4 09/03/2017 1049   PROT 7.7 11/17/2014 2031   ALBUMIN 4.4 11/07/2019 1134   ALBUMIN 4.8 (H) 09/03/2017 1049   ALBUMIN 3.9 11/17/2014 2031   AST 30 11/07/2019 1134   AST 40 11/17/2014 2031   ALT 23 11/07/2019 1134   ALT 27 11/17/2014 2031   ALKPHOS 45 11/07/2019 1134   ALKPHOS 44 11/17/2014 2031   BILITOT 0.6 11/07/2019 1134   BILITOT 0.6 09/03/2017 1049   BILITOT 0.3 11/17/2014 2031   GFRNONAA >60 03/14/2019 1247   GFRNONAA >60 11/17/2014 2031   GFRAA >60 03/14/2019 1247   GFRAA >60 11/17/2014 2031   Lab Results  Component Value Date   WBC 7.3 11/07/2019   HGB 12.1 11/07/2019   HCT 35.4 (L) 11/07/2019   MCV 94.6 11/07/2019   PLT 159.0 11/07/2019    Imaging Studies: No results found.  Assessment and Plan:   KANESIA VANTREASE is a 85 y.o. y/o female with fecal incontinence likely due to pelvic floor dysfunction  We have reached  out to pelvic floor physical therapy department to help facilitate her appointment with them  There are no alarm symptoms present  We discussed risks and benefits of endoscopic procedures/colonoscopy in detail with patient and her daughter.  They would like to hold off on procedures at this time and wait to see if pelvic floor physical therapy helps  Okay to take Imodium on a scheduled basis every other day, and twice a day on days that she is continuing to have diarrhea and has to leave the house.  This would be for short-term basis until pelvic floor physical therapy starts helping her.  If symptoms no better, re-discuss colonoscopy at that time during future clinic appointments   Dr Amy Morton

## 2019-12-16 NOTE — Discharge Instructions (Addendum)
As we discussed please be sure to drink fluids over the weekend. Please be sure to follow up with your doctor on Monday for recheck of your sodium levels. Please seek medical attention for any high fevers, chest pain, shortness of breath, change in behavior, persistent vomiting, bloody stool or any other new or concerning symptoms.

## 2019-12-16 NOTE — ED Notes (Signed)
Pt reports dizziness had subsided while sitting in bed but upon sitting and ambulating to bedside toilet pt repots dizziness has returned and is the same as earlier int he day.   Pt able to ambulate with assistance.

## 2019-12-17 NOTE — ED Notes (Signed)
PT reports feeling safe to be discharged home. Pt able to ambulate to the lobby.

## 2019-12-19 ENCOUNTER — Ambulatory Visit (INDEPENDENT_AMBULATORY_CARE_PROVIDER_SITE_OTHER): Payer: Medicare Other | Admitting: Family Medicine

## 2019-12-19 ENCOUNTER — Encounter: Payer: Self-pay | Admitting: Family Medicine

## 2019-12-19 ENCOUNTER — Other Ambulatory Visit: Payer: Self-pay

## 2019-12-19 VITALS — BP 130/80 | HR 67 | Temp 97.3°F | Ht 59.0 in | Wt 139.0 lb

## 2019-12-19 DIAGNOSIS — I2583 Coronary atherosclerosis due to lipid rich plaque: Secondary | ICD-10-CM | POA: Diagnosis not present

## 2019-12-19 DIAGNOSIS — I2510 Atherosclerotic heart disease of native coronary artery without angina pectoris: Secondary | ICD-10-CM | POA: Diagnosis not present

## 2019-12-19 DIAGNOSIS — K529 Noninfective gastroenteritis and colitis, unspecified: Secondary | ICD-10-CM | POA: Diagnosis not present

## 2019-12-19 DIAGNOSIS — E871 Hypo-osmolality and hyponatremia: Secondary | ICD-10-CM | POA: Diagnosis not present

## 2019-12-19 DIAGNOSIS — R413 Other amnesia: Secondary | ICD-10-CM

## 2019-12-19 LAB — BASIC METABOLIC PANEL
BUN: 15 mg/dL (ref 6–23)
CO2: 25 mEq/L (ref 19–32)
Calcium: 9.3 mg/dL (ref 8.4–10.5)
Chloride: 95 mEq/L — ABNORMAL LOW (ref 96–112)
Creatinine, Ser: 0.76 mg/dL (ref 0.40–1.20)
GFR: 72.74 mL/min (ref >60.00)
Glucose, Bld: 117 mg/dL — ABNORMAL HIGH (ref 70–99)
Potassium: 4.1 mEq/L (ref 3.5–5.1)
Sodium: 129 mEq/L — ABNORMAL LOW (ref 135–145)

## 2019-12-19 NOTE — Assessment & Plan Note (Signed)
Chronic issues with this.  Denies depression.  She will continue to monitor.

## 2019-12-19 NOTE — Progress Notes (Signed)
[name], MD Phone: [phone]  Amy Morton is a 85 y.o. female who presents today for follow-up.  Dizziness/hyponatremia/memory difficulty: Patient was seen in the ED last week for dizziness and a fall.  Patient is unsure if the fall is related to the dizziness.  She was noted to be hyponatremic and that was felt to be contributing to her symptoms.  She was given IV fluids and discharged.  She notes the symptoms have improved to a certain degree after the fluids.  The patient also notes her memory has been worse the last few months.  She could not remember going to the hospital.  She is still able to take care of home and cook and clean for herself.  She notes no depression.  She is on Celexa and mirtazapine.  Chronic diarrhea: Recently saw GI.  They are trying to get her in for pelvic floor physical therapy.  They recommended as needed Imodium every other day until pelvic floor physical therapy starts to work.  The patient know she does not want to do physical therapy and does not want to go back to the GI doctor.  They wonder if she could take 1 Imodium tablet daily to help with the diarrhea.  Social History   Tobacco Use  Smoking Status Never Smoker  Smokeless Tobacco Never Used     ROS see history of present illness  Objective  Physical Exam Vitals:   12/19/19 1133  BP: 130/80  Pulse: 67  Temp: (!) 97.3 F (36.3 C)  SpO2: 97%    BP Readings from Last 3 Encounters:  12/19/19 130/80  12/17/19 (!) 185/92  12/15/19 (!) 169/78   Wt Readings from Last 3 Encounters:  12/19/19 139 lb (63 kg)  12/16/19 142 lb (64.4 kg)  12/15/19 142 lb 6.4 oz (64.6 kg)    Physical Exam Constitutional:      General: She is not in acute distress.    Appearance: She is not diaphoretic.  Cardiovascular:     Rate and Rhythm: Normal rate and regular rhythm.     Heart sounds: Normal heart sounds.  Pulmonary:     Effort: Pulmonary effort is normal.     Breath sounds: Normal  breath sounds.  Skin:    General: Skin is warm and dry.  Neurological:     Mental Status: She is alert.     Comments: Normal gait      Assessment/Plan: Please see individual problem list.  Chronic diarrhea I advised that the benefit of 1 Imodium daily likely outweighs the risk for the patient given lack of quality of life with her chronic diarrhea.  She will trial the Imodium once daily for this.  I offered referral to an alternative GI physician though she declines this at this time.  Memory difficulty Chronic issues with this.  Denies depression.  She will continue to monitor.  Hyponatremia I suspect this is contributing to her dizziness and her fall last week.  We will recheck her sodium level.  Discussed the potential for discontinuing Celexa to see if that will help.  Discussed the potential that her diarrhea could be contributing as well.   Orders Placed This Encounter  Procedures  . Basic Metabolic Panel (BMET)    No orders of the defined types were placed in this encounter.   This visit occurred during the SARS-CoV-2 public health emergency.  Safety protocols were in place, including screening questions prior to the visit, additional usage of staff PPE, and extensive  cleaning of exam room while observing appropriate contact time as indicated for disinfecting solutions.    [name], MD [place]

## 2019-12-19 NOTE — Assessment & Plan Note (Signed)
I suspect this is contributing to her dizziness and her fall last week.  We will recheck her sodium level.  Discussed the potential for discontinuing Celexa to see if that will help.  Discussed the potential that her diarrhea could be contributing as well.

## 2019-12-19 NOTE — Patient Instructions (Signed)
Nice to see you. We will recheck your sodium levels.  We will need to consider discontinuing your Celexa if you remain hyponatremic.  Please try to stay adequately hydrated.

## 2019-12-19 NOTE — Assessment & Plan Note (Signed)
I advised that the benefit of 1 Imodium daily likely outweighs the risk for the patient given lack of quality of life with her chronic diarrhea.  She will trial the Imodium once daily for this.  I offered referral to an alternative GI physician though she declines this at this time.

## 2019-12-20 ENCOUNTER — Other Ambulatory Visit: Payer: Self-pay | Admitting: Family Medicine

## 2019-12-20 DIAGNOSIS — E871 Hypo-osmolality and hyponatremia: Secondary | ICD-10-CM

## 2019-12-23 ENCOUNTER — Other Ambulatory Visit: Payer: Self-pay | Admitting: Family Medicine

## 2019-12-23 DIAGNOSIS — G4700 Insomnia, unspecified: Secondary | ICD-10-CM

## 2019-12-29 ENCOUNTER — Other Ambulatory Visit (INDEPENDENT_AMBULATORY_CARE_PROVIDER_SITE_OTHER): Payer: Medicare Other

## 2019-12-29 ENCOUNTER — Other Ambulatory Visit: Payer: Self-pay

## 2019-12-29 DIAGNOSIS — E871 Hypo-osmolality and hyponatremia: Secondary | ICD-10-CM

## 2019-12-29 LAB — BASIC METABOLIC PANEL
BUN: 12 mg/dL (ref 6–23)
CO2: 25 mEq/L (ref 19–32)
Calcium: 9.2 mg/dL (ref 8.4–10.5)
Chloride: 96 mEq/L (ref 96–112)
Creatinine, Ser: 0.67 mg/dL (ref 0.40–1.20)
GFR: 84.12 mL/min (ref >60.00)
Glucose, Bld: 119 mg/dL — ABNORMAL HIGH (ref 70–99)
Potassium: 4.2 mEq/L (ref 3.5–5.1)
Sodium: 129 mEq/L — ABNORMAL LOW (ref 135–145)

## 2020-01-03 ENCOUNTER — Other Ambulatory Visit: Payer: Self-pay | Admitting: Family Medicine

## 2020-01-03 DIAGNOSIS — E871 Hypo-osmolality and hyponatremia: Secondary | ICD-10-CM

## 2020-01-17 ENCOUNTER — Other Ambulatory Visit: Payer: Self-pay

## 2020-01-17 ENCOUNTER — Other Ambulatory Visit (INDEPENDENT_AMBULATORY_CARE_PROVIDER_SITE_OTHER): Payer: Medicare Other

## 2020-01-17 DIAGNOSIS — E871 Hypo-osmolality and hyponatremia: Secondary | ICD-10-CM

## 2020-01-17 LAB — BASIC METABOLIC PANEL
BUN: 14 mg/dL (ref 6–23)
CO2: 29 mEq/L (ref 19–32)
Calcium: 9.7 mg/dL (ref 8.4–10.5)
Chloride: 97 mEq/L (ref 96–112)
Creatinine, Ser: 0.73 mg/dL (ref 0.40–1.20)
GFR: 76.18 mL/min (ref >60.00)
Glucose, Bld: 145 mg/dL — ABNORMAL HIGH (ref 70–99)
Potassium: 3.9 mEq/L (ref 3.5–5.1)
Sodium: 134 mEq/L — ABNORMAL LOW (ref 135–145)

## 2020-01-18 ENCOUNTER — Encounter: Payer: Self-pay | Admitting: Family Medicine

## 2020-01-19 MED ORDER — BUSPIRONE HCL 5 MG PO TABS: 5.0000 mg | ORAL_TABLET | Freq: Three times a day (TID) | ORAL | 1 refills | Status: DC

## 2020-02-10 ENCOUNTER — Ambulatory Visit (INDEPENDENT_AMBULATORY_CARE_PROVIDER_SITE_OTHER): Payer: Medicare Other | Admitting: Family Medicine

## 2020-02-10 ENCOUNTER — Encounter: Payer: Self-pay | Admitting: Family Medicine

## 2020-02-10 ENCOUNTER — Other Ambulatory Visit: Payer: Self-pay

## 2020-02-10 DIAGNOSIS — G4700 Insomnia, unspecified: Secondary | ICD-10-CM

## 2020-02-10 DIAGNOSIS — K529 Noninfective gastroenteritis and colitis, unspecified: Secondary | ICD-10-CM | POA: Diagnosis not present

## 2020-02-10 DIAGNOSIS — F329 Major depressive disorder, single episode, unspecified: Secondary | ICD-10-CM

## 2020-02-10 DIAGNOSIS — F419 Anxiety disorder, unspecified: Secondary | ICD-10-CM | POA: Diagnosis not present

## 2020-02-10 DIAGNOSIS — I208 Other forms of angina pectoris: Secondary | ICD-10-CM | POA: Diagnosis not present

## 2020-02-10 DIAGNOSIS — I2510 Atherosclerotic heart disease of native coronary artery without angina pectoris: Secondary | ICD-10-CM

## 2020-02-10 DIAGNOSIS — R232 Flushing: Secondary | ICD-10-CM | POA: Diagnosis not present

## 2020-02-10 DIAGNOSIS — I2583 Coronary atherosclerosis due to lipid rich plaque: Secondary | ICD-10-CM | POA: Diagnosis not present

## 2020-02-10 DIAGNOSIS — F32A Depression, unspecified: Secondary | ICD-10-CM

## 2020-02-10 LAB — BASIC METABOLIC PANEL
BUN: 13 mg/dL (ref 6–23)
CO2: 25 mEq/L (ref 19–32)
Calcium: 9.5 mg/dL (ref 8.4–10.5)
Chloride: 105 mEq/L (ref 96–112)
Creatinine, Ser: 0.77 mg/dL (ref 0.40–1.20)
GFR: 71.62 mL/min (ref >60.00)
Glucose, Bld: 117 mg/dL — ABNORMAL HIGH (ref 70–99)
Potassium: 4.4 mEq/L (ref 3.5–5.1)
Sodium: 140 mEq/L (ref 135–145)

## 2020-02-10 LAB — CBC WITH DIFFERENTIAL/PLATELET
Basophils Absolute: 0 10*3/uL (ref 0.0–0.1)
Basophils Relative: 0.5 % (ref 0.0–3.0)
Eosinophils Absolute: 0.4 10*3/uL (ref 0.0–0.7)
Eosinophils Relative: 6.1 % — ABNORMAL HIGH (ref 0.0–5.0)
HCT: 36.4 % (ref 36.0–46.0)
Hemoglobin: 12.2 g/dL (ref 12.0–15.0)
Lymphocytes Relative: 36.1 % (ref 12.0–46.0)
Lymphs Abs: 2.5 10*3/uL (ref 0.7–4.0)
MCHC: 33.5 g/dL (ref 30.0–36.0)
MCV: 94.8 fl (ref 78.0–100.0)
Monocytes Absolute: 0.6 10*3/uL (ref 0.1–1.0)
Monocytes Relative: 8.3 % (ref 3.0–12.0)
Neutro Abs: 3.4 10*3/uL (ref 1.4–7.7)
Neutrophils Relative %: 49 % (ref 43.0–77.0)
Platelets: 165 10*3/uL (ref 150.0–400.0)
RBC: 3.84 Mil/uL — ABNORMAL LOW (ref 3.87–5.11)
RDW: 12.3 % (ref 11.5–15.5)
WBC: 6.9 10*3/uL (ref 4.0–10.5)

## 2020-02-10 LAB — TSH: TSH: 1.54 u[IU]/mL (ref 0.35–4.50)

## 2020-02-10 MED ORDER — MIRTAZAPINE 15 MG PO TABS: ORAL_TABLET | ORAL | 1 refills | Status: DC

## 2020-02-10 NOTE — Patient Instructions (Signed)
Nice to see you. Please make sure you are taking the BuSpar 3 times a day.  We will increase her Remeron to 15 mg once daily. We will get some lab work today and contact you with the results.

## 2020-02-10 NOTE — Assessment & Plan Note (Signed)
Doing well.  She will continue her current regimen.

## 2020-02-10 NOTE — Assessment & Plan Note (Signed)
Iatrogenic related to prior partial colectomy.  Discussed continuing Imodium.  Offered referral to a different GI physician though she declined at this time.

## 2020-02-10 NOTE — Progress Notes (Signed)
[name], MD Phone: [phone]  Amy Morton is a 85 y.o. female who presents today for f/u.  Anxiety/depression: Patient notes the depression has worsened since coming off of her Celexa.  She is only been taking BuSpar once daily.  She is also on Remeron.  No SI.  She will cry for no reason.  She notes her memory is not good either.  Chronic diarrhea: Likely related to prior partial colectomy.  Imodium does help some.  She is already been 3 times today.  She declines going to see a different GI physician.  CAD: Taking Imdur, Plavix, and metoprolol.  No chest pain or shortness of breath.  Hot flashes: These have been going on for a while now.  They occur several times a day.  She will suddenly feel hot and have a little bit of perspiration that will last for a few minutes and then go away on its own.  No specific nighttime symptoms.  Social History   Tobacco Use  Smoking Status Never Smoker  Smokeless Tobacco Never Used     ROS see history of present illness  Objective  Physical Exam Vitals:   02/10/20 1113  BP: 120/70  Pulse: 72  Temp: 98.8 F (37.1 C)  SpO2: 98%    BP Readings from Last 3 Encounters:  02/10/20 120/70  12/19/19 130/80  12/17/19 (!) 185/92   Wt Readings from Last 3 Encounters:  02/10/20 137 lb 6.4 oz (62.3 kg)  12/19/19 139 lb (63 kg)  12/16/19 142 lb (64.4 kg)    Physical Exam Constitutional:      General: She is not in acute distress.    Appearance: She is not diaphoretic.  Cardiovascular:     Rate and Rhythm: Normal rate and regular rhythm.     Heart sounds: Normal heart sounds.  Pulmonary:     Effort: Pulmonary effort is normal.     Breath sounds: Normal breath sounds.  Musculoskeletal:     Right lower leg: No edema.     Left lower leg: No edema.  Skin:    General: Skin is warm and dry.  Neurological:     Mental Status: She is alert.      Assessment/Plan: Please see individual problem list.  CAD (coronary  artery disease) Doing well.  She will continue her current regimen.  Chronic diarrhea Iatrogenic related to prior partial colectomy.  Discussed continuing Imodium.  Offered referral to a different GI physician though she declined at this time.  Anxiety and depression These issues have worsened since switching off of Celexa.  Discussed that she should be taking the BuSpar 3 times daily.  She will start on that.  We will increase her Remeron dose.  We will follow up in 6 weeks.  Hot flashes Undetermined cause.  No specific nighttime symptoms.  We will check a CBC and a TSH.    Orders Placed This Encounter  Procedures  . TSH  . CBC w/Diff  . Basic Metabolic Panel (BMET)    Meds ordered this encounter  Medications  . mirtazapine (REMERON) 15 MG tablet    Sig: TAKE 1 TABLET BY MOUTH EVERY NIGHT AT BEDTIME    Dispense:  90 tablet    Refill:  1    This visit occurred during the SARS-CoV-2 public health emergency.  Safety protocols were in place, including screening questions prior to the visit, additional usage of staff PPE, and extensive cleaning of exam room while observing appropriate contact time as indicated  for disinfecting solutions.    [name], MD [place]

## 2020-02-10 NOTE — Assessment & Plan Note (Signed)
These issues have worsened since switching off of Celexa.  Discussed that she should be taking the BuSpar 3 times daily.  She will start on that.  We will increase her Remeron dose.  We will follow up in 6 weeks.

## 2020-02-10 NOTE — Assessment & Plan Note (Addendum)
Undetermined cause.  No specific nighttime symptoms.  We will check a CBC and a TSH.

## 2020-02-14 ENCOUNTER — Ambulatory Visit: Payer: Medicare Other | Admitting: Gastroenterology

## 2020-03-06 ENCOUNTER — Ambulatory Visit (INDEPENDENT_AMBULATORY_CARE_PROVIDER_SITE_OTHER): Payer: Medicare Other

## 2020-03-06 ENCOUNTER — Other Ambulatory Visit: Payer: Self-pay

## 2020-03-06 DIAGNOSIS — I350 Nonrheumatic aortic (valve) stenosis: Secondary | ICD-10-CM | POA: Diagnosis not present

## 2020-03-06 LAB — ECHOCARDIOGRAM COMPLETE
AR max vel: 0.86 cm2
AV Area VTI: 1.18 cm2
AV Area mean vel: 0.68 cm2
AV Mean grad: 26 mmHg
AV Peak grad: 48.2 mmHg
Ao pk vel: 3.47 m/s
Area-P 1/2: 2.33 cm2

## 2020-03-06 MED ORDER — PERFLUTREN LIPID MICROSPHERE
1.0000 mL | INTRAVENOUS | Status: AC | PRN
  Administered 2020-03-06: 2 mL via INTRAVENOUS

## 2020-03-08 ENCOUNTER — Telehealth: Payer: Self-pay

## 2020-03-08 ENCOUNTER — Ambulatory Visit: Payer: Medicare Other

## 2020-03-08 DIAGNOSIS — I340 Nonrheumatic mitral (valve) insufficiency: Secondary | ICD-10-CM

## 2020-03-08 DIAGNOSIS — I350 Nonrheumatic aortic (valve) stenosis: Secondary | ICD-10-CM

## 2020-03-08 NOTE — Telephone Encounter (Signed)
-----   Message from Wellington Hampshire, MD sent at 03/08/2020  3:42 PM EDT ----- Normal ejection fraction.  Stable moderate aortic stenosis although there is no more regurgitation.  Still not significant enough to require replacement.  Repeat echo in 1 year.

## 2020-03-08 NOTE — Telephone Encounter (Signed)
Called to give the patient echo results. [email or web address]. 

## 2020-03-11 ENCOUNTER — Other Ambulatory Visit: Payer: Self-pay | Admitting: Internal Medicine

## 2020-03-11 ENCOUNTER — Other Ambulatory Visit: Payer: Self-pay | Admitting: Family Medicine

## 2020-03-11 ENCOUNTER — Other Ambulatory Visit: Payer: Self-pay | Admitting: Cardiovascular Disease

## 2020-03-11 DIAGNOSIS — G4700 Insomnia, unspecified: Secondary | ICD-10-CM

## 2020-03-11 DIAGNOSIS — F419 Anxiety disorder, unspecified: Secondary | ICD-10-CM

## 2020-03-11 DIAGNOSIS — F329 Major depressive disorder, single episode, unspecified: Secondary | ICD-10-CM

## 2020-03-11 DIAGNOSIS — F32A Depression, unspecified: Secondary | ICD-10-CM

## 2020-03-13 NOTE — Telephone Encounter (Signed)
Patient calling to discuss recent testing results  ° °Please call  ° °

## 2020-03-13 NOTE — Telephone Encounter (Signed)
Patient made aware of echo results and Dr. Tyrell Antonio recommendation with verbalized understanding. Order placed in Epic for 48yr repeat echo.

## 2020-03-27 ENCOUNTER — Encounter: Payer: Self-pay | Admitting: Family Medicine

## 2020-03-27 ENCOUNTER — Other Ambulatory Visit: Payer: Self-pay

## 2020-03-27 ENCOUNTER — Ambulatory Visit (INDEPENDENT_AMBULATORY_CARE_PROVIDER_SITE_OTHER): Payer: Medicare Other | Admitting: Family Medicine

## 2020-03-27 DIAGNOSIS — F419 Anxiety disorder, unspecified: Secondary | ICD-10-CM

## 2020-03-27 DIAGNOSIS — G4700 Insomnia, unspecified: Secondary | ICD-10-CM | POA: Diagnosis not present

## 2020-03-27 DIAGNOSIS — I2510 Atherosclerotic heart disease of native coronary artery without angina pectoris: Secondary | ICD-10-CM

## 2020-03-27 DIAGNOSIS — I2583 Coronary atherosclerosis due to lipid rich plaque: Secondary | ICD-10-CM | POA: Diagnosis not present

## 2020-03-27 DIAGNOSIS — F329 Major depressive disorder, single episode, unspecified: Secondary | ICD-10-CM

## 2020-03-27 DIAGNOSIS — R413 Other amnesia: Secondary | ICD-10-CM | POA: Diagnosis not present

## 2020-03-27 LAB — VITAMIN B12: Vitamin B-12: 823 pg/mL (ref 211–911)

## 2020-03-27 MED ORDER — MIRTAZAPINE 30 MG PO TABS: ORAL_TABLET | ORAL | 1 refills | Status: DC

## 2020-03-27 NOTE — Assessment & Plan Note (Signed)
Continues to have issues with this.  We will increase her Remeron.  We will see her back in 4 weeks.

## 2020-03-27 NOTE — Patient Instructions (Signed)
Nice to see you. We will increase her Remeron dose to 30 mg once daily. I will see you back in 4 weeks. If you have any excessive sleepiness with the increased dose of Remeron please let us know.

## 2020-03-27 NOTE — Progress Notes (Signed)
  [name], MD Phone: [phone]  Amy Morton is a 85 y.o. female who presents today for follow-up.  Anxiety/depression/memory difficulty: Patient notes this is not any better.  She has been on Remeron 15 mg for a couple of months.  She has not taken the BuSpar in some time.  No SI.  She does feel as though her memory issues got worse after stopping the Celexa.  She does not feel like doing anything.  Has trouble remembering names and where she put stuff.  She has forgotten directions at times.  They do note she was doing better when she was on the BuSpar 3 times a day though she cannot remember exactly when she stopped taking that.  They note her appetite is down.  Her weight has trended down.  TSH recently acceptable.  She does sleep well.  Social History   Tobacco Use  Smoking Status Never Smoker  Smokeless Tobacco Never Used     ROS see history of present illness  Objective  Physical Exam Vitals:   03/27/20 1108  BP: 140/79  Pulse: 81  Temp: 98.2 F (36.8 C)  SpO2: 96%    BP Readings from Last 3 Encounters:  03/27/20 140/79  02/10/20 120/70  12/19/19 130/80   Wt Readings from Last 3 Encounters:  03/27/20 136 lb 9.6 oz (62 kg)  02/10/20 137 lb 6.4 oz (62.3 kg)  12/19/19 139 lb (63 kg)    Physical Exam Constitutional:      General: She is not in acute distress.    Appearance: She is not diaphoretic.  Cardiovascular:     Rate and Rhythm: Normal rate and regular rhythm.     Heart sounds: Murmur (2/6 systolic ejection murmur right upper sternal border) heard.   Pulmonary:     Effort: Pulmonary effort is normal.     Breath sounds: Normal breath sounds.  Skin:    General: Skin is warm and dry.  Neurological:     Mental Status: She is alert.      Assessment/Plan: Please see individual problem list.  Anxiety and depression Continues to have issues with this.  We will increase her Remeron.  We will see her back in 4 weeks.  Memory  difficulty Chronic issue.  I suspect this is related to her depression.  We will see how she responds to the increased dose of Remeron.  We will also check a B12.   Orders Placed This Encounter  Procedures  . B12    Meds ordered this encounter  Medications  . mirtazapine (REMERON) 30 MG tablet    Sig: TAKE 1 TABLET BY MOUTH EVERY NIGHT AT BEDTIME    Dispense:  90 tablet    Refill:  1    This visit occurred during the SARS-CoV-2 public health emergency.  Safety protocols were in place, including screening questions prior to the visit, additional usage of staff PPE, and extensive cleaning of exam room while observing appropriate contact time as indicated for disinfecting solutions.    [name], MD [place]

## 2020-03-27 NOTE — Assessment & Plan Note (Signed)
Chronic issue.  I suspect this is related to her depression.  We will see how she responds to the increased dose of Remeron.  We will also check a B12.

## 2020-04-19 DIAGNOSIS — H43813 Vitreous degeneration, bilateral: Secondary | ICD-10-CM | POA: Diagnosis not present

## 2020-04-23 ENCOUNTER — Other Ambulatory Visit: Payer: Self-pay | Admitting: Family Medicine

## 2020-04-24 ENCOUNTER — Other Ambulatory Visit: Payer: Self-pay

## 2020-04-24 ENCOUNTER — Ambulatory Visit (INDEPENDENT_AMBULATORY_CARE_PROVIDER_SITE_OTHER): Payer: Medicare Other | Admitting: Family Medicine

## 2020-04-24 ENCOUNTER — Encounter: Payer: Self-pay | Admitting: Family Medicine

## 2020-04-24 VITALS — BP 120/80 | HR 62 | Temp 98.5°F | Ht 59.0 in | Wt 136.4 lb

## 2020-04-24 DIAGNOSIS — Z23 Encounter for immunization: Secondary | ICD-10-CM | POA: Diagnosis not present

## 2020-04-24 DIAGNOSIS — F419 Anxiety disorder, unspecified: Secondary | ICD-10-CM

## 2020-04-24 DIAGNOSIS — F329 Major depressive disorder, single episode, unspecified: Secondary | ICD-10-CM | POA: Diagnosis not present

## 2020-04-24 DIAGNOSIS — R413 Other amnesia: Secondary | ICD-10-CM | POA: Diagnosis not present

## 2020-04-24 DIAGNOSIS — I2583 Coronary atherosclerosis due to lipid rich plaque: Secondary | ICD-10-CM

## 2020-04-24 DIAGNOSIS — I2510 Atherosclerotic heart disease of native coronary artery without angina pectoris: Secondary | ICD-10-CM

## 2020-04-24 MED ORDER — BUSPIRONE HCL 5 MG PO TABS: 5.0000 mg | ORAL_TABLET | Freq: Three times a day (TID) | ORAL | 1 refills | Status: DC

## 2020-04-24 NOTE — Patient Instructions (Signed)
Nice to see you. Please continue with the Remeron and BuSpar. Please monitor your memory and if it worsens at any point please let us know.

## 2020-04-24 NOTE — Assessment & Plan Note (Signed)
Continue Remeron 15 mg once nightly and BuSpar 5 mg 3 times daily.

## 2020-04-24 NOTE — Assessment & Plan Note (Signed)
Some slight improvement.  MMSE testing is acceptable.  Discussed monitoring on her current medicines for depression.

## 2020-04-24 NOTE — Progress Notes (Signed)
[name], MD Phone: [phone]  Amy Morton is a 85 y.o. female who presents today for follow-up.  Memory difficulty/depression: Patient notes possibly having some mild depression though nothing significant.  The person who came with her notes that her memory seems to be a little bit better though the patient notes she has had trouble remembering when certain shows her on TV.  She has been on Remeron and notes she rests well with that.  Also taking BuSpar.  No SI.  MMSE - Mini Mental State Exam 04/24/2020 03/05/2018 03/04/2017  Orientation to time 4 5 5   Orientation to Place 5 5 5   Registration 3 3 3   Attention/ Calculation 4 5 5   Recall 3 1 3   Recall-comments - 1 out of 3 words recalled -  Language- name 2 objects 2 2 2   Language- repeat 1 1 1   Language- follow 3 step command 3 3 3   Language- read & follow direction 1 1 1   Write a sentence 1 1 1   Copy design 1 1 1   Total score 28 28 30    Depression screen [facility] 2/9 04/24/2020 04/24/2020 02/10/2020 11/07/2019 08/19/2019  Decreased Interest 3 0 3 0 0  Down, Depressed, Hopeless 0 0 3 0 0  PHQ - 2 Score 3 0 6 0 0  Altered sleeping 0 - 0 - -  Tired, decreased energy 1 - 3 - -  Change in appetite 0 - 0 - -  Feeling bad or failure about yourself  0 - 0 - -  Trouble concentrating 0 - 3 - -  Moving slowly or fidgety/restless 0 - 0 - -  Suicidal thoughts 0 - 0 - -  PHQ-9 Score 4 - 12 - -  Difficult doing work/chores Not difficult at all - Somewhat difficult - -     Social History   Tobacco Use  Smoking Status Never Smoker  Smokeless Tobacco Never Used     ROS see history of present illness  Objective  Physical Exam Vitals:   04/24/20 1139  BP: 120/80  Pulse: 62  Temp: 98.5 F (36.9 C)  SpO2: 96%    BP Readings from Last 3 Encounters:  04/24/20 120/80  03/27/20 140/79  02/10/20 120/70   Wt Readings from Last 3 Encounters:  04/24/20 136 lb 6.4 oz (61.9 kg)  03/27/20 136 lb 9.6 oz (62 kg)  02/10/20 137 lb  6.4 oz (62.3 kg)    Physical Exam Constitutional:      General: She is not in acute distress.    Appearance: She is not diaphoretic.  Cardiovascular:     Rate and Rhythm: Normal rate and regular rhythm.     Heart sounds: Normal heart sounds.  Pulmonary:     Effort: Pulmonary effort is normal.     Breath sounds: Normal breath sounds.  Skin:    General: Skin is warm and dry.  Neurological:     Mental Status: She is alert.      Assessment/Plan: Please see individual problem list.  Memory difficulty Some slight improvement.  MMSE testing is acceptable.  Discussed monitoring on her current medicines for depression.  Anxiety and depression Continue Remeron 15 mg once nightly and BuSpar 5 mg 3 times daily.  No orders of the defined types were placed in this encounter.   Meds ordered this encounter  Medications  . busPIRone (BUSPAR) 5 MG tablet    Sig: Take 1 tablet (5 mg total) by mouth 3 (three) times daily.  Dispense:  270 tablet    Refill:  1    Amy Morton was seen today for follow-up.  Diagnoses and all orders for this visit:  Memory difficulty  Anxiety and depression  Other orders -     busPIRone (BUSPAR) 5 MG tablet; Take 1 tablet (5 mg total) by mouth 3 (three) times daily.     This visit occurred during the SARS-CoV-2 public health emergency.  Safety protocols were in place, including screening questions prior to the visit, additional usage of staff PPE, and extensive cleaning of exam room while observing appropriate contact time as indicated for disinfecting solutions.    [name], MD [place]

## 2020-05-02 ENCOUNTER — Ambulatory Visit (INDEPENDENT_AMBULATORY_CARE_PROVIDER_SITE_OTHER): Payer: Medicare Other

## 2020-05-02 VITALS — Ht 59.0 in | Wt 136.0 lb

## 2020-05-02 DIAGNOSIS — Z0000 Encounter for general adult medical examination without abnormal findings: Secondary | ICD-10-CM

## 2020-05-02 NOTE — Progress Notes (Signed)
Subjective:   Amy Morton is a 85 y.o. female who presents for Medicare Annual (Subsequent) preventive examination.  Review of Systems    No ROS.  Medicare Wellness Virtual Visit.   Cardiac Risk Factors include: advanced age (>66men, >64 women);hypertension     Objective:    Today's Vitals   05/02/20 1031  Weight: 136 lb (61.7 kg)  Height: 4\' 11"  (1.499 m)   Body mass index is 27.47 kg/m.  Advanced Directives 05/02/2020 12/16/2019 03/08/2019 03/05/2018 01/22/2018 01/22/2018 03/04/2017  Does Patient Have a Medical Advance Directive? Yes No Yes Yes Yes No Yes  Type of Paramedic of [place];Living will - [place];Living will [place];Living will Living will - [place];Living will  Does patient want to make changes to medical advance directive? No - Patient declined - No - Patient declined No - Patient declined No - Patient declined - No - Patient declined  Copy of [place] in Chart? Yes - validated most recent copy scanned in chart (See row information) - Yes - validated most recent copy scanned in chart (See row information) Yes - - No - copy requested    Current Medications (verified) Outpatient Encounter Medications as of 05/02/2020  Medication Sig  . acetaminophen (TYLENOL) 650 MG CR tablet Take 1,000 mg by mouth 2 (two) times daily.   Marland Kitchen albuterol (VENTOLIN HFA) 108 (90 Base) MCG/ACT inhaler Inhale 2 puffs into the lungs every 4 (four) hours as needed for wheezing or shortness of breath.  Marland Kitchen aspirin 81 MG tablet Take 81 mg by mouth daily.  Marland Kitchen atorvastatin (LIPITOR) 80 MG tablet Take 0.5 tablets (40 mg total) by mouth daily at 6 PM.  . busPIRone (BUSPAR) 5 MG tablet Take 1 tablet (5 mg total) by mouth 3 (three) times daily.  . Cholecalciferol 5000 units TABS Take 5,000 Units by mouth daily.  . clopidogrel (PLAVIX) 75 MG tablet TAKE 1 TABLET BY MOUTH DAILY WITH BREAKFAST  .  isosorbide mononitrate (IMDUR) 30 MG 24 hr tablet TAKE 1 TABLET(30 MG) BY MOUTH DAILY  . loperamide (IMODIUM A-D) 2 MG tablet Take 2 mg by mouth 4 (four) times daily as needed for diarrhea or loose stools.  . metoprolol tartrate (LOPRESSOR) 50 MG tablet TAKE 1 TABLET(50 MG) BY MOUTH TWICE DAILY  . mirtazapine (REMERON) 30 MG tablet TAKE 1 TABLET BY MOUTH EVERY NIGHT AT BEDTIME  . Multiple Vitamin (MULTIVITAMIN) capsule Take 1 capsule by mouth daily.  . nitroGLYCERIN (NITROSTAT) 0.4 MG SL tablet PLACE 1 TABLET UNDER THE TONGUE EVERY 5 MINUTES AS NEEDED FOR CHEST PAIN, 3 DOSES MAX  . Nutritional Supplements (OSTEO ADVANCE) TABS Take by mouth once.  Marland Kitchen omega-3 acid ethyl esters (LOVAZA) 1 g capsule TAKE 2 CAPSULES BY MOUTH TWICE DAILY  . pantoprazole (PROTONIX) 40 MG tablet TAKE 1 TABLET(40 MG) BY MOUTH DAILY  . Ubiquinol (QUNOL COQ10/UBIQUINOL/MEGA) 100 MG CAPS Take 100 mg by mouth every morning.  . vitamin B-12 (CYANOCOBALAMIN) 250 MCG tablet Take 250 mcg by mouth daily.   Facility-Administered Encounter Medications as of 05/02/2020  Medication  . sodium chloride 0.9 % nebulizer solution 3 mL   Followed by  . methacholine (PROVOCHOLINE) inhaler solution 0.125 mg   Followed by  . methacholine (PROVOCHOLINE) inhaler solution 0.5 mg   Followed by  . methacholine (PROVOCHOLINE) inhaler solution 2 mg   Followed by  . methacholine (PROVOCHOLINE) inhaler solution 8 mg   Followed by  .  methacholine (PROVOCHOLINE) inhaler solution 32 mg    Allergies (verified) Abilify [aripiprazole]   History: Past Medical History:  Diagnosis Date  . Abnormal mammogram 2007   Recommend close follow up  . Anemia    Iron, b12, SPEP, UPEP normal, 12/2012  . Anxiety   . Aortic stenosis    mild to moderate  . aortic stenosis   . Arthritis   . Asthma   . CAD (coronary artery disease)    s/p angioplasty 1996  . Chronic cough   . Depression   . Diverticulosis   . Dyspnea   . Elevated LFTs 2010   s/p  ultrasound w/ possible fatty liver; GI consult. Resolution 2011  . Gastric ulcer   . GERD (gastroesophageal reflux disease)   . History of colonic polyps    Dr. Vira Agar  . HOH (hard of hearing)   . Hyperlipidemia   . Hypertension   . Peritoneal carcinomatosis [facility]) 2001   Dr. Oliva Bustard & Dr. Claiborne Rigg s/p chemo  . Shingles 2008   T-9 distribution  . Valvular heart disease    Mild to Moderate MR, AS  . Vitamin D deficiency   . Wheezing    Past Surgical History:  Procedure Laterality Date  . ABDOMINAL HYSTERECTOMY  2001  . APPENDECTOMY    . CARDIAC CATHETERIZATION  2007   50% mid LAD stenosis, 70% proximal RCA with 100% distal RCA stenosis with collaterals, EF 65%  . CARDIAC CATHETERIZATION N/A 04/27/2015   Procedure: Left Heart Cath and Coronary Angiography;  Surgeon: Jettie Booze, MD;  Location: [place] [facility];  Service: Cardiovascular;  Laterality: N/A;  . CARDIAC CATHETERIZATION N/A 04/27/2015   Procedure: Coronary Stent Intervention;  Surgeon: Jettie Booze, MD;  Location: [place] [facility];  Service: Cardiovascular;  Laterality: N/A;  . CATARACT EXTRACTION W/PHACO Left 09/25/2016   Procedure: CATARACT EXTRACTION PHACO AND INTRAOCULAR LENS PLACEMENT (IOC);  Surgeon: Eulogio Bear, MD;  Location: [facility];  Service: Ophthalmology;  Laterality: Left;  [place] 46.7 AP% 8.1 CDE 3.80 Fluid pack lot # [id number]  . CATARACT EXTRACTION W/PHACO Right 11/06/2016   Procedure: CATARACT EXTRACTION PHACO AND INTRAOCULAR LENS PLACEMENT ([place]);  Surgeon: Eulogio Bear, MD;  Location: [facility] OR[facility];  Service: Ophthalmology;  Laterality: Right;  [place] 49.5 AP8.7 CDE4.28 LOT # [id number]  . CHOLECYSTECTOMY  1987  . COLECTOMY  2001  . CORONARY ANGIOPLASTY  1996  . CORONARY ANGIOPLASTY    . LAPAROSCOPIC BILATERAL SALPINGO OOPHERECTOMY  1988   Dr. Laverta Baltimore  . LEFT HEART CATH AND CORONARY ANGIOGRAPHY N/A 01/25/2018   Procedure: LEFT HEART CATH AND CORONARY ANGIOGRAPHY;  Surgeon: Wellington Hampshire, MD;  Location: [place] [facility];  Service: Cardiovascular;  Laterality: N/A;  . TONSILLECTOMY  1950   Family History  Problem Relation Age of Onset  . Hypertension Mother   . Stroke Mother 33       Cerebral Hemorrhage  . Cancer Father        bone cancer  . Diabetes Sister   . Diabetes Brother   . Heart disease Brother   . Cancer Daughter 77       Ovarian cancer  . Colon cancer Neg Hx   . Colon polyps Neg Hx    Social History   Socioeconomic History  . Marital status: Widowed    Spouse name: Not on file  . Number of children: 3  . Years of education: Not on file  . Highest education level:  Not on file  Occupational History  . Occupation: [employer]    Comment: Retired  . 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 Smoking status: Never Smoker  . Smokeless tobacco: Never Used  Vaping Use  . Vaping Use: Never used  Substance and Sexual Activity  . Alcohol use: No    Alcohol/week: 0.0 standard drinks  . Drug use: No  . Sexual activity: Never  Other Topics Concern  . Not on file  Social History Narrative   Pt lives in [place] by herself. She is widowed and has a daughter [name]), son [name]). She also had a son that was killed in a work related accident [name] - died age).       Caffeine - none   Exercise - walking   Social Determinants of Health   Financial Resource Strain: Low Risk   . Difficulty of Paying Living Expenses: Not hard at all  Food Insecurity: No Food Insecurity  . Worried About Charity fundraiser in the Last Year: Never true  . Ran Out of Food in the Last Year: Never true  Transportation Needs: No Transportation Needs  . Lack of Transportation (Medical): No  . Lack of Transportation (Non-Medical): No  Physical Activity:   . Days of Exercise per Week: Not on file  . Minutes of Exercise per Session: Not on file  Stress: No Stress Concern Present  . Feeling of Stress : Not at all  Social  Connections:   . Frequency of Communication with Friends and Family: Not on file  . Frequency of Social Gatherings with Friends and Family: Not on file  . Attends Religious Services: Not on file  . Active Member of Clubs or Organizations: Not on file  . Attends Archivist Meetings: Not on file  . Marital Status: Not on file    Tobacco Counseling Counseling given: Not Answered   Clinical Intake:  Pre-visit preparation completed: Yes        Diabetes: No  How often do you need to have someone help you when you read instructions, pamphlets, or other written materials from your doctor or pharmacy?: 1 - Never  Interpreter Needed?: No      Activities of Daily Living In your present state of health, do you have any difficulty performing the following activities: 05/02/2020  Hearing? N  Vision? N  Difficulty concentrating or making decisions? Y  Comment Loves to read and complete puzzles for brain health. See MMSE 04/24/20  Walking or climbing stairs? N  Dressing or bathing? N  Doing errands, shopping? Y  Preparing Food and eating ? N  Using the Toilet? N  In the past six months, have you accidently leaked urine? N  Do you have problems with loss of bowel control? N  Managing your Medications? Y  Comment Sister [name] assist  Managing your Finances? Y  Comment Sister Ok Edwards or managing your Housekeeping? N  Some recent data might be hidden    Patient Care Team: [name], MD as PCP - General (Family Medicine) Nahser, Wonda Cheng, MD as PCP - Cardiology (Cardiology) Sherryl Barters, NP as Nurse Practitioner (Nurse Practitioner)  Indicate any recent Medical Services you may have received from other than [facility] providers in the past year (date may be approximate).     Assessment:   This is a routine wellness examination for [place].  I connected with Jillianna today by telephone and  verified that I am speaking with the correct person  using two identifiers. Location patient: home Location provider: work Persons participating in the virtual visit: patient, Marine scientist.    I discussed the limitations, risks, security and privacy concerns of performing an evaluation and management service by telephone and the availability of in person appointments. The patient expressed understanding and verbally consented to this telephonic visit.    Interactive audio and video telecommunications were attempted between this provider and patient, however failed, due to patient having technical difficulties OR patient did not have access to video capability.  We continued and completed visit with audio only.  Some vital signs may be absent or patient reported.   Hearing/Vision screen  Hearing Screening   125Hz  250Hz  500Hz  1000Hz  2000Hz  3000Hz  4000Hz  6000Hz  8000Hz   Right ear:           Left ear:           Comments: Patient is able to hear conversational tones without difficulty.  No issues reported.  Vision Screening Comments: Wears corrective lenses  Cataract extraction, bilateral  Visual acuity not assessed, virtual visit They have seen their ophthalmologist in the last 12 months   Dietary issues and exercise activities discussed: Current Exercise Habits: Home exercise routine, Type of exercise: walking, Intensity: Mild Healthy diet Good water intake  Goals    . Follow up with Primary Care Provider     As needed      Depression Screen PHQ 2/9 Scores 05/02/2020 04/24/2020 04/24/2020 02/10/2020 11/07/2019 08/19/2019 04/15/2019  PHQ - 2 Score - 3 0 6 0 0 0  PHQ- 9 Score - 4 - 12 - - -  Exception Documentation Other- indicate reason in comment box - - - - - -  Not completed Currently on treatment. No change since last reported 1 week ago. - - - - - -    Fall Risk Fall Risk  05/02/2020 04/24/2020 02/10/2020 11/07/2019 08/19/2019  Falls in the past year? 0 0 1 0 0  Comment - - - - -  Number falls in past yr: 0 0 0 0 0  Comment - - - - -  Injury  with Fall? - - 0 - -  Follow up Falls evaluation completed Falls evaluation completed Falls evaluation completed Falls evaluation completed Falls evaluation completed   Handrails in use when climbing stairs? Yes Home free of loose throw rugs in walkways, pet beds, electrical cords, etc? Yes  Adequate lighting in your home to reduce risk of falls? Yes   ASSISTIVE DEVICES UTILIZED TO PREVENT FALLS: Use of a cane, walker or w/c? No  TIMED UP AND GO: Was the test performed? No . Virtual visit.   Cognitive Function: MMSE completed less than 30 days ago. MMSE - Mini Mental State Exam 04/24/2020 03/05/2018 03/04/2017 03/04/2016  Orientation to time 4 5 5 5   Orientation to Place 5 5 5 5   Registration 3 3 3 3   Attention/ Calculation 4 5 5 5   Recall 3 1 3 2   Recall-comments - 1 out of 3 words recalled - -  Language- name 2 objects 2 2 2 2   Language- repeat 1 1 1 1   Language- follow 3 step command 3 3 3 3   Language- read & follow direction 1 1 1 1   Write a sentence 1 1 1 1   Copy design 1 1 1 1   Total score 28 28 30 29      6CIT Screen 03/08/2019  What Year? 0 points  What  month? 0 points  What time? 0 points  Count back from 20 0 points  Months in reverse 0 points  Repeat phrase 0 points  Total Score 0    Immunizations Immunization History  Administered Date(s) Administered  . DTaP 06/23/2011  . Fluad Quad(high Dose 65+) 04/24/2020  . Influenza, High Dose Seasonal PF 04/07/2017, 04/07/2019  . Influenza,inj,Quad PF,6+ Mos 04/17/2014, 05/07/2015, 05/07/2018  . Influenza-Unspecified 04/13/2012, 04/08/2013, 04/17/2014, 05/07/2015, 05/02/2016  . PFIZER SARS-COV-2 Vaccination 08/29/2019, 09/19/2019  . Pneumococcal Conjugate-13 03/04/2016  . Pneumococcal Polysaccharide-23 11/20/2005, 11/21/2010  . Zoster 01/21/2011    Health Maintenance There are no preventive care reminders to display for this patient. Health Maintenance  Topic Date Due  . TETANUS/TDAP  03/13/2026  . INFLUENZA  VACCINE  Completed  . DEXA SCAN  Completed  . COVID-19 Vaccine  Completed  . PNA vac Low Risk Adult  Completed   Dental Screening: Recommended annual dental exams for proper oral hygiene  Community Resource Referral / Chronic Care Management: CRR required this visit?  No   CCM required this visit?  No      Plan:   Keep all routine maintenance appointments.   Follow up 07/24/20 @ 12:00  I have personally reviewed and noted the following in the patient's chart:   . Medical and social history . Use of alcohol, tobacco or illicit drugs  . Current medications and supplements . Functional ability and status . Nutritional status . Physical activity . Advanced directives . List of other physicians . Hospitalizations, surgeries, and ER visits in previous 12 months . Vitals . Screenings to include cognitive, depression, and falls . Referrals and appointments  In addition, I have reviewed and discussed with patient certain preventive protocols, quality metrics, and best practice recommendations. A written personalized care plan for preventive services as well as general preventive health recommendations were provided to patient via mychart.     Varney Biles, LPN   04/28/7252

## 2020-05-02 NOTE — Patient Instructions (Addendum)
Amy Morton , Thank you for taking time to come for your Medicare Wellness Visit. I appreciate your ongoing commitment to your health goals. Please review the following plan we discussed and let me know if I can assist you in the future.   These are the goals we discussed: Goals    . Follow up with Primary Care Provider     As needed       This is a list of the screening recommended for you and due dates:  Health Maintenance  Topic Date Due  . Tetanus Vaccine  03/13/2026  . Flu Shot  Completed  . DEXA scan (bone density measurement)  Completed  . COVID-19 Vaccine  Completed  . Pneumonia vaccines  Completed    Immunizations Immunization History  Administered Date(s) Administered  . DTaP 06/23/2011  . Fluad Quad(high Dose 65+) 04/24/2020  . Influenza, High Dose Seasonal PF 04/07/2017, 04/07/2019  . Influenza,inj,Quad PF,6+ Mos 04/17/2014, 05/07/2015, 05/07/2018  . Influenza-Unspecified 04/13/2012, 04/08/2013, 04/17/2014, 05/07/2015, 05/02/2016  . PFIZER SARS-COV-2 Vaccination 08/29/2019, 09/19/2019  . Pneumococcal Conjugate-13 03/04/2016  . Pneumococcal Polysaccharide-23 11/20/2005, 11/21/2010  . Zoster 01/21/2011   Advanced directives: yes, on file  Conditions/risks identified: none new  Follow up in one year for your annual wellness visit    Preventive Care 65 Years and Older, Female Preventive care refers to lifestyle choices and visits with your health care provider that can promote health and wellness. What does preventive care include?  A yearly physical exam. This is also called an annual well check.  Dental exams once or twice a year.  Routine eye exams. Ask your health care provider how often you should have your eyes checked.  Personal lifestyle choices, including:  Daily care of your teeth and gums.  Regular physical activity.  Eating a healthy diet.  Avoiding tobacco and drug use.  Limiting alcohol use.  Practicing safe sex.  Taking low-dose  aspirin every day.  Taking vitamin and mineral supplements as recommended by your health care provider. What happens during an annual well check? The services and screenings done by your health care provider during your annual well check will depend on your age, overall health, lifestyle risk factors, and family history of disease. Counseling  Your health care provider may ask you questions about your:  Alcohol use.  Tobacco use.  Drug use.  Emotional well-being.  Home and relationship well-being.  Sexual activity.  Eating habits.  History of falls.  Memory and ability to understand (cognition).  Work and work Statistician.  Reproductive health. Screening  You may have the following tests or measurements:  Height, weight, and BMI.  Blood pressure.  Lipid and cholesterol levels. These may be checked every 5 years, or more frequently if you are over 74 years old.  Skin check.  Lung cancer screening. You may have this screening every year starting at age 47 if you have a 30-pack-year history of smoking and currently smoke or have quit within the past 15 years.  Fecal occult blood test (FOBT) of the stool. You may have this test every year starting at age 69.  Flexible sigmoidoscopy or colonoscopy. You may have a sigmoidoscopy every 5 years or a colonoscopy every 10 years starting at age 45.  Hepatitis C blood test.  Hepatitis B blood test.  Sexually transmitted disease (STD) testing.  Diabetes screening. This is done by checking your blood sugar (glucose) after you have not eaten for a while (fasting). You may have this done every  1-3 years.  Bone density scan. This is done to screen for osteoporosis. You may have this done starting at age 64.  Mammogram. This may be done every 1-2 years. Talk to your health care provider about how often you should have regular mammograms. Talk with your health care provider about your test results, treatment options, and if  necessary, the need for more tests. Vaccines  Your health care provider may recommend certain vaccines, such as:  Influenza vaccine. This is recommended every year.  Tetanus, diphtheria, and acellular pertussis (Tdap, Td) vaccine. You may need a Td booster every 10 years.  Zoster vaccine. You may need this after age 3.  Pneumococcal 13-valent conjugate (PCV13) vaccine. One dose is recommended after age 63.  Pneumococcal polysaccharide (PPSV23) vaccine. One dose is recommended after age 55. Talk to your health care provider about which screenings and vaccines you need and how often you need them. This information is not intended to replace advice given to you by your health care provider. Make sure you discuss any questions you have with your health care provider. Document Released: 08/10/2015 Document Revised: 04/02/2016 Document Reviewed: 05/15/2015 Elsevier Interactive Patient Education  2017 [place] Prevention in the Home Falls can cause injuries. They can happen to people of all ages. There are many things you can do to make your home safe and to help prevent falls. What can I do on the outside of my home?  Regularly fix the edges of walkways and driveways and fix any cracks.  Remove anything that might make you trip as you walk through a door, such as a raised step or threshold.  Trim any bushes or trees on the path to your home.  Use bright outdoor lighting.  Clear any walking paths of anything that might make someone trip, such as rocks or tools.  Regularly check to see if handrails are loose or broken. Make sure that both sides of any steps have handrails.  Any raised decks and porches should have guardrails on the edges.  Have any leaves, snow, or ice cleared regularly.  Use sand or salt on walking paths during winter.  Clean up any spills in your garage right away. This includes oil or grease spills. What can I do in the bathroom?  Use night  lights.  Install grab bars by the toilet and in the tub and shower. Do not use towel bars as grab bars.  Use non-skid mats or decals in the tub or shower.  If you need to sit down in the shower, use a plastic, non-slip stool.  Keep the floor dry. Clean up any water that spills on the floor as soon as it happens.  Remove soap buildup in the tub or shower regularly.  Attach bath mats securely with double-sided non-slip rug tape.  Do not have throw rugs and other things on the floor that can make you trip. What can I do in the bedroom?  Use night lights.  Make sure that you have a light by your bed that is easy to reach.  Do not use any sheets or blankets that are too big for your bed. They should not hang down onto the floor.  Have a firm chair that has side arms. You can use this for support while you get dressed.  Do not have throw rugs and other things on the floor that can make you trip. What can I do in the kitchen?  Clean up any spills right away.  Avoid walking on wet floors.  Keep items that you use a lot in easy-to-reach places.  If you need to reach something above you, use a strong step stool that has a grab bar.  Keep electrical cords out of the way.  Do not use floor polish or wax that makes floors slippery. If you must use wax, use non-skid floor wax.  Do not have throw rugs and other things on the floor that can make you trip. What can I do with my stairs?  Do not leave any items on the stairs.  Make sure that there are handrails on both sides of the stairs and use them. Fix handrails that are broken or loose. Make sure that handrails are as long as the stairways.  Check any carpeting to make sure that it is firmly attached to the stairs. Fix any carpet that is loose or worn.  Avoid having throw rugs at the top or bottom of the stairs. If you do have throw rugs, attach them to the floor with carpet tape.  Make sure that you have a light switch at the  top of the stairs and the bottom of the stairs. If you do not have them, ask someone to add them for you. What else can I do to help prevent falls?  Wear shoes that:  Do not have high heels.  Have rubber bottoms.  Are comfortable and fit you well.  Are closed at the toe. Do not wear sandals.  If you use a stepladder:  Make sure that it is fully opened. Do not climb a closed stepladder.  Make sure that both sides of the stepladder are locked into place.  Ask someone to hold it for you, if possible.  Clearly mark and make sure that you can see:  Any grab bars or handrails.  First and last steps.  Where the edge of each step is.  Use tools that help you move around (mobility aids) if they are needed. These include:  Canes.  Walkers.  Scooters.  Crutches.  Turn on the lights when you go into a dark area. Replace any light bulbs as soon as they burn out.  Set up your furniture so you have a clear path. Avoid moving your furniture around.  If any of your floors are uneven, fix them.  If there are any pets around you, be aware of where they are.  Review your medicines with your doctor. Some medicines can make you feel dizzy. This can increase your chance of falling. Ask your doctor what other things that you can do to help prevent falls. This information is not intended to replace advice given to you by your health care provider. Make sure you discuss any questions you have with your health care provider. Document Released: 05/10/2009 Document Revised: 12/20/2015 Document Reviewed: 08/18/2014 Elsevier Interactive Patient Education  2017 [employer].

## 2020-05-07 DIAGNOSIS — Z23 Encounter for immunization: Secondary | ICD-10-CM | POA: Diagnosis not present

## 2020-05-10 ENCOUNTER — Encounter: Payer: Self-pay | Admitting: Emergency Medicine

## 2020-05-10 ENCOUNTER — Ambulatory Visit (INDEPENDENT_AMBULATORY_CARE_PROVIDER_SITE_OTHER): Payer: Medicare Other | Admitting: Cardiovascular Disease

## 2020-05-10 ENCOUNTER — Emergency Department: Payer: Medicare Other

## 2020-05-10 ENCOUNTER — Encounter: Payer: Self-pay | Admitting: Cardiovascular Disease

## 2020-05-10 ENCOUNTER — Emergency Department
Admission: EM | Admit: 2020-05-10 | Discharge: 2020-05-10 | Disposition: A | Discharge status: Home / Self Care | Payer: Medicare Other | Attending: Emergency Medicine | Admitting: Emergency Medicine

## 2020-05-10 ENCOUNTER — Other Ambulatory Visit: Payer: Self-pay

## 2020-05-10 VITALS — BP 120/70 | HR 69 | Ht 59.0 in | Wt 138.2 lb

## 2020-05-10 DIAGNOSIS — I25810 Atherosclerosis of coronary artery bypass graft(s) without angina pectoris: Secondary | ICD-10-CM | POA: Insufficient documentation

## 2020-05-10 DIAGNOSIS — I200 Unstable angina: Secondary | ICD-10-CM | POA: Diagnosis not present

## 2020-05-10 DIAGNOSIS — R072 Precordial pain: Secondary | ICD-10-CM | POA: Diagnosis not present

## 2020-05-10 DIAGNOSIS — R0789 Other chest pain: Secondary | ICD-10-CM

## 2020-05-10 DIAGNOSIS — Z79899 Other long term (current) drug therapy: Secondary | ICD-10-CM | POA: Insufficient documentation

## 2020-05-10 DIAGNOSIS — I119 Hypertensive heart disease without heart failure: Secondary | ICD-10-CM | POA: Insufficient documentation

## 2020-05-10 DIAGNOSIS — J45909 Unspecified asthma, uncomplicated: Secondary | ICD-10-CM | POA: Diagnosis not present

## 2020-05-10 DIAGNOSIS — E785 Hyperlipidemia, unspecified: Secondary | ICD-10-CM

## 2020-05-10 DIAGNOSIS — R55 Syncope and collapse: Secondary | ICD-10-CM | POA: Insufficient documentation

## 2020-05-10 DIAGNOSIS — Z7982 Long term (current) use of aspirin: Secondary | ICD-10-CM | POA: Diagnosis not present

## 2020-05-10 DIAGNOSIS — R079 Chest pain, unspecified: Secondary | ICD-10-CM | POA: Diagnosis not present

## 2020-05-10 DIAGNOSIS — J449 Chronic obstructive pulmonary disease, unspecified: Secondary | ICD-10-CM | POA: Insufficient documentation

## 2020-05-10 DIAGNOSIS — I350 Nonrheumatic aortic (valve) stenosis: Secondary | ICD-10-CM

## 2020-05-10 DIAGNOSIS — I10 Essential (primary) hypertension: Secondary | ICD-10-CM | POA: Diagnosis not present

## 2020-05-10 LAB — BASIC METABOLIC PANEL
Anion gap: 12 (ref 5–15)
BUN: 11 mg/dL (ref 8–23)
CO2: 22 mmol/L (ref 22–32)
Calcium: 8.9 mg/dL (ref 8.9–10.3)
Chloride: 97 mmol/L — ABNORMAL LOW (ref 98–111)
Creatinine, Ser: 0.75 mg/dL (ref 0.44–1.00)
GFR, Estimated: >60 mL/min (ref >60)
Glucose, Bld: 122 mg/dL — ABNORMAL HIGH (ref 70–99)
Potassium: 3.6 mmol/L (ref 3.5–5.1)
Sodium: 131 mmol/L — ABNORMAL LOW (ref 135–145)

## 2020-05-10 LAB — CBC
HCT: 33.1 % — ABNORMAL LOW (ref 36.0–46.0)
Hemoglobin: 11.6 g/dL — ABNORMAL LOW (ref 12.0–15.0)
MCH: 31.4 pg (ref 26.0–34.0)
MCHC: 35 g/dL (ref 30.0–36.0)
MCV: 89.7 fL (ref 80.0–100.0)
Platelets: 191 10*3/uL (ref 150–400)
RBC: 3.69 MIL/uL — ABNORMAL LOW (ref 3.87–5.11)
RDW: 11.7 % (ref 11.5–15.5)
WBC: 7.6 10*3/uL (ref 4.0–10.5)
nRBC: 0 % (ref 0.0–0.2)

## 2020-05-10 LAB — TROPONIN I (HIGH SENSITIVITY)
Troponin I (High Sensitivity): 14 ng/L (ref <18)
Troponin I (High Sensitivity): 14 ng/L (ref <18)

## 2020-05-10 NOTE — Discharge Instructions (Addendum)
Return to the ER immediately for new, worsening, or persistent severe chest pain, difficulty breathing, feeling like you are going to pass out, or any other new or worsening symptoms that concern you.  Dr. Fletcher Anon has scheduled you for a cardiac catheterization next week, and his office will get in touch with you about this.

## 2020-05-10 NOTE — ED Notes (Signed)
MD ok with patient being discharge with current blood pressure

## 2020-05-10 NOTE — ED Notes (Signed)
MD at bedside. 

## 2020-05-10 NOTE — ED Notes (Signed)
Discharge instructions reviewed with patient and family . Pt AOx4 upon discharge, pt denied pain or sob

## 2020-05-10 NOTE — Progress Notes (Signed)
Cardiology Office Note   Date:  05/10/2020   ID:  JESSEKA DRINKARD, DOB 1935/04/28, MRN [id number]  PCP:  [name], MD  Cardiologist:   Kathlyn Sacramento, MD   Chief Complaint  Patient presents with  . Follow-up    6 month. Meds reviewed by the pt. verbally. Pt. c/o chest pain, shortness of breath and feeling a "jerking" in chest.       History of Present Illness: Amy Morton is a 85 y.o. female who presents for for a follow-up visit regarding coronary artery disease and moderate aortic stenosis. Other medical problems include anemia, hypertension, hyperlipidemia, anxiety and depression. She had previous left circumflex PCI in 2016 with known occluded distal right coronary artery. The patient was hospitalized in June 2019 at [facility] with unstable angina.  She underwent cardiac catheterization which showed patent left circumflex stents without significant restenosis, stable discrete 70% stenosis in the mid LAD and chronically occluded distal RCA with collaterals.  Aortic stenosis was moderate with a peak to peak gradient of 20 mmHg.  LVEDP was mildly elevated.  Overall, there was no significant change in coronary anatomy since 2016.  She lives by herself as her husband died more than 10 years ago.  She only has 1 son alive.  She continues to be independent but her memory has declined and also her anxiety does not seem to be controlled since Celexa was switched to BuSpar. Over the last 2 months, she experienced progressive exertional chest tightness described as burning sensation which is happening with minimal activities when she is trying to do some yard work.  She usually has to stop and rest after few minutes to let the pain go away.  She has been taking her medications regularly.   Chest pai is usually associated with significant shortness of breath.n   Past Medical History:  Diagnosis Date  . Abnormal mammogram 2007   Recommend close follow up  . Anemia    Iron,  b12, SPEP, UPEP normal, 12/2012  . Anxiety   . Aortic stenosis    mild to moderate  . aortic stenosis   . Arthritis   . Asthma   . CAD (coronary artery disease)    s/p angioplasty 1996  . Chronic cough   . Depression   . Diverticulosis   . Dyspnea   . Elevated LFTs 2010   s/p ultrasound w/ possible fatty liver; GI consult. Resolution 2011  . Gastric ulcer   . GERD (gastroesophageal reflux disease)   . History of colonic polyps    Dr. Vira Agar  . HOH (hard of hearing)   . Hyperlipidemia   . Hypertension   . Peritoneal carcinomatosis [facility]) 2001   Dr. Oliva Bustard & Dr. Claiborne Rigg s/p chemo  . Shingles 2008   T-9 distribution  . Valvular heart disease    Mild to Moderate MR, AS  . Vitamin D deficiency   . Wheezing     Past Surgical History:  Procedure Laterality Date  . ABDOMINAL HYSTERECTOMY  2001  . APPENDECTOMY    . CARDIAC CATHETERIZATION  2007   50% mid LAD stenosis, 70% proximal RCA with 100% distal RCA stenosis with collaterals, EF 65%  . CARDIAC CATHETERIZATION N/A 04/27/2015   Procedure: Left Heart Cath and Coronary Angiography;  Surgeon: Jettie Booze, MD;  Location: [place] [facility];  Service: Cardiovascular;  Laterality: N/A;  . CARDIAC CATHETERIZATION N/A 04/27/2015   Procedure: Coronary Stent Intervention;  Surgeon: Jettie Booze,  MD;  Location: [place] [facility];  Service: Cardiovascular;  Laterality: N/A;  . CATARACT EXTRACTION W/PHACO Left 09/25/2016   Procedure: CATARACT EXTRACTION PHACO AND INTRAOCULAR LENS PLACEMENT (IOC);  Surgeon: Eulogio Bear, MD;  Location: [facility] OR[facility];  Service: Ophthalmology;  Laterality: Left;  [place] 46.7 AP% 8.1 CDE 3.80 Fluid pack lot # [id number]  . CATARACT EXTRACTION W/PHACO Right 11/06/2016   Procedure: CATARACT EXTRACTION PHACO AND INTRAOCULAR LENS PLACEMENT (IXL);  Surgeon: Eulogio Bear, MD;  Location: [facility] OR[facility];  Service: Ophthalmology;  Laterality: Right;  [place] 49.5 AP8.7 CDE4.28 LOT # [id number]  .  CHOLECYSTECTOMY  1987  . COLECTOMY  2001  . CORONARY ANGIOPLASTY  1996  . CORONARY ANGIOPLASTY    . LAPAROSCOPIC BILATERAL SALPINGO OOPHERECTOMY  1988   Dr. Laverta Baltimore  . LEFT HEART CATH AND CORONARY ANGIOGRAPHY N/A 01/25/2018   Procedure: LEFT HEART CATH AND CORONARY ANGIOGRAPHY;  Surgeon: Wellington Hampshire, MD;  Location: [place] [facility];  Service: Cardiovascular;  Laterality: N/A;  . TONSILLECTOMY  1950     Current Outpatient Medications  Medication Sig Dispense Refill  . acetaminophen (TYLENOL) 650 MG CR tablet Take 1,000 mg by mouth 2 (two) times daily.     Marland Kitchen albuterol (VENTOLIN HFA) 108 (90 Base) MCG/ACT inhaler Inhale 2 puffs into the lungs every 4 (four) hours as needed for wheezing or shortness of breath. 18 g 0  . aspirin 81 MG tablet Take 81 mg by mouth daily.    Marland Kitchen atorvastatin (LIPITOR) 80 MG tablet Take 0.5 tablets (40 mg total) by mouth daily at 6 PM. 45 tablet 1  . busPIRone (BUSPAR) 5 MG tablet Take 1 tablet (5 mg total) by mouth 3 (three) times daily. 270 tablet 1  . Cholecalciferol 5000 units TABS Take 5,000 Units by mouth daily.    . clopidogrel (PLAVIX) 75 MG tablet TAKE 1 TABLET BY MOUTH DAILY WITH BREAKFAST 90 tablet 1  . isosorbide mononitrate (IMDUR) 30 MG 24 hr tablet TAKE 1 TABLET(30 MG) BY MOUTH DAILY 90 tablet 2  . loperamide (IMODIUM A-D) 2 MG tablet Take 2 mg by mouth 4 (four) times daily as needed for diarrhea or loose stools.    . metoprolol tartrate (LOPRESSOR) 50 MG tablet TAKE 1 TABLET(50 MG) BY MOUTH TWICE DAILY 180 tablet 2  . mirtazapine (REMERON) 30 MG tablet TAKE 1 TABLET BY MOUTH EVERY NIGHT AT BEDTIME 90 tablet 1  . Multiple Vitamin (MULTIVITAMIN) capsule Take 1 capsule by mouth daily.    . nitroGLYCERIN (NITROSTAT) 0.4 MG SL tablet PLACE 1 TABLET UNDER THE TONGUE EVERY 5 MINUTES AS NEEDED FOR CHEST PAIN, 3 DOSES MAX 25 tablet 1  . Nutritional Supplements (OSTEO ADVANCE) TABS Take by mouth once.    Marland Kitchen omega-3 acid ethyl esters (LOVAZA) 1 g capsule  TAKE 2 CAPSULES BY MOUTH TWICE DAILY 360 capsule 1  . pantoprazole (PROTONIX) 40 MG tablet TAKE 1 TABLET(40 MG) BY MOUTH DAILY 90 tablet 2  . Ubiquinol (QUNOL COQ10/UBIQUINOL/MEGA) 100 MG CAPS Take 100 mg by mouth every morning.    . vitamin B-12 (CYANOCOBALAMIN) 250 MCG tablet Take 250 mcg by mouth daily.     No current facility-administered medications for this visit.   Facility-Administered Medications Ordered in Other Visits  Medication Dose Route Frequency Provider Last Rate Last Admin  . sodium chloride 0.9 % nebulizer solution 3 mL  3 mL Nebulization Once [name], MD       Followed by  . methacholine (PROVOCHOLINE) inhaler  solution 0.125 mg  2 mL Inhalation Once [name], MD       Followed by  . methacholine (PROVOCHOLINE) inhaler solution 0.5 mg  2 mL Inhalation Once [name], MD       Followed by  . methacholine (PROVOCHOLINE) inhaler solution 2 mg  2 mL Inhalation Once [name], MD       Followed by  . methacholine (PROVOCHOLINE) inhaler solution 8 mg  2 mL Inhalation Once [name], MD       Followed by  . methacholine (PROVOCHOLINE) inhaler solution 32 mg  2 mL Inhalation Once [name], MD        Allergies:   Abilify [aripiprazole]    Social History:  The patient  reports that she has never smoked. She has never used smokeless tobacco. She reports that she does not drink alcohol and does not use drugs.   Family History:  The patient's family history includes Cancer in her father; Cancer (age of onset: 27) in her daughter; Diabetes in her brother and sister; Heart disease in her brother; Hypertension in her mother; Stroke (age of onset: 74) in her mother.    ROS:  Please see the history of present illness.   Otherwise, review of systems are positive for none.   All other systems are reviewed and negative.    PHYSICAL EXAM: VS:  BP 120/70 (BP Location: Left Arm, Patient Position: Sitting, Cuff Size: Normal)    Pulse 69   Ht 4\' 11"  (1.499 m)   Wt 138 lb 4 oz (62.7 kg)   SpO2 98%   BMI 27.92 kg/m  , BMI Body mass index is 27.92 kg/m. GEN: Well nourished, well developed, in no acute distress  HEENT: normal  Neck: no JVD, carotid bruits, or masses Cardiac: RRR; no rubs, or gallops,no edema .  3/6 crescendo decrescendo systolic murmur in the aortic area which is mid peaking. Respiratory:  clear to auscultation bilaterally, normal work of breathing GI: soft, nontender, nondistended, + BS MS: no deformity or atrophy  Skin: warm and dry, no rash Neuro:  Strength and sensation are intact Psych: euthymic mood, full affect   EKG:  EKG is ordered today. The ekg ordered today demonstrates sinus rhythm, LVH with repolarization abnormalities   Recent Labs: 11/07/2019: ALT 23 11/17/2019: Magnesium 1.5 02/10/2020: BUN 13; Creatinine, Ser 0.77; Hemoglobin 12.2; Platelets 165.0; Potassium 4.4; Sodium 140; TSH 1.54    Lipid Panel    Component Value Date/Time   CHOL 109 02/21/2019 1012   CHOL 115 09/03/2017 1049   TRIG 204.0 (H) 02/21/2019 1012   HDL 45.40 02/21/2019 1012   HDL 50 09/03/2017 1049   CHOLHDL 2 02/21/2019 1012   VLDL 40.8 (H) 02/21/2019 1012   LDLCALC 20 09/13/2018 0906   LDLCALC 29 09/03/2017 1049   LDLDIRECT 35.0 02/21/2019 1012      Wt Readings from Last 3 Encounters:  05/10/20 138 lb 4 oz (62.7 kg)  05/02/20 136 lb (61.7 kg)  04/24/20 136 lb 6.4 oz (61.9 kg)       No flowsheet data found.    ASSESSMENT AND PLAN:  1.  Coronary artery disease involving native coronary arteries with worsening angina, the patient describes progressive symptoms of worsening exertional chest pain.  She has known history of coronary artery disease and moderate severe aortic stenosis.  I discussed different management options with them and recommended proceeding with a right and left cardiac catheterization possible PCI.  We  were about to schedule this procedure as an outpatient but then the  patient started having chest pain at rest in the exam room with associated dizziness and presyncope.  I rechecked her blood pressure in the sitting position and it was 88/60.  This improved to 100/60 with lying down.  We repeated her EKG which showed no acute changes.  She was not hypoxic. She continued to feel dizzy and lightheaded and could not stand on her own.  Given these symptoms and chest pain at rest, will going to transfer the patient to the ED for evaluation.  2.  Moderate to severe aortic stenosis: Was stable on most recent echocardiogram although there was more regurgitation.  Given her declining memory, I am not entirely sure she will be a good candidate for TAVR of aortic stenosis as a culprit.  3.  Essential hypertension: Blood pressure is controlled on current medications.  4.  Hyperlipidemia: Continue treatment with high-dose atorvastatin.  Most recent LDL was 29.  5.  Memory decline: Possible early dementia.  6.  Worsening anxiety since stopping Celexa.  Managed by Dr. Caryl Bis.   Disposition:   Transfer to the ED for evaluation regarding unstable angina and presyncope.  Signed,  Kathlyn Sacramento, MD  05/10/2020 11:07 AM    [place]

## 2020-05-10 NOTE — H&P (View-Only) (Signed)
Cardiology Office Note   Date:  05/10/2020   ID:  Amy Morton, DOB January 31, 1935, MRN [id number]  PCP:  [name], MD  Cardiologist:   Kathlyn Sacramento, MD   Chief Complaint  Patient presents with  . Follow-up    6 month. Meds reviewed by the pt. verbally. Pt. c/o chest pain, shortness of breath and feeling a "jerking" in chest.       History of Present Illness: Amy Morton is a 85 y.o. female who presents for for a follow-up visit regarding coronary artery disease and moderate aortic stenosis. Other medical problems include anemia, hypertension, hyperlipidemia, anxiety and depression. She had previous left circumflex PCI in 2016 with known occluded distal right coronary artery. The patient was hospitalized in June 2019 at [facility] with unstable angina.  She underwent cardiac catheterization which showed patent left circumflex stents without significant restenosis, stable discrete 70% stenosis in the mid LAD and chronically occluded distal RCA with collaterals.  Aortic stenosis was moderate with a peak to peak gradient of 20 mmHg.  LVEDP was mildly elevated.  Overall, there was no significant change in coronary anatomy since 2016.  She lives by herself as her husband died more than 10 years ago.  She only has 1 son alive.  She continues to be independent but her memory has declined and also her anxiety does not seem to be controlled since Celexa was switched to BuSpar. Over the last 2 months, she experienced progressive exertional chest tightness described as burning sensation which is happening with minimal activities when she is trying to do some yard work.  She usually has to stop and rest after few minutes to let the pain go away.  She has been taking her medications regularly.   Chest pai is usually associated with significant shortness of breath.n   Past Medical History:  Diagnosis Date  . Abnormal mammogram 2007   Recommend close follow up  . Anemia    Iron,  b12, SPEP, UPEP normal, 12/2012  . Anxiety   . Aortic stenosis    mild to moderate  . aortic stenosis   . Arthritis   . Asthma   . CAD (coronary artery disease)    s/p angioplasty 1996  . Chronic cough   . Depression   . Diverticulosis   . Dyspnea   . Elevated LFTs 2010   s/p ultrasound w/ possible fatty liver; GI consult. Resolution 2011  . Gastric ulcer   . GERD (gastroesophageal reflux disease)   . History of colonic polyps    Dr. Vira Agar  . HOH (hard of hearing)   . Hyperlipidemia   . Hypertension   . Peritoneal carcinomatosis [facility]) 2001   Dr. Oliva Bustard & Dr. Claiborne Rigg s/p chemo  . Shingles 2008   T-9 distribution  . Valvular heart disease    Mild to Moderate MR, AS  . Vitamin D deficiency   . Wheezing     Past Surgical History:  Procedure Laterality Date  . ABDOMINAL HYSTERECTOMY  2001  . APPENDECTOMY    . CARDIAC CATHETERIZATION  2007   50% mid LAD stenosis, 70% proximal RCA with 100% distal RCA stenosis with collaterals, EF 65%  . CARDIAC CATHETERIZATION N/A 04/27/2015   Procedure: Left Heart Cath and Coronary Angiography;  Surgeon: Jettie Booze, MD;  Location: [place] [facility];  Service: Cardiovascular;  Laterality: N/A;  . CARDIAC CATHETERIZATION N/A 04/27/2015   Procedure: Coronary Stent Intervention;  Surgeon: Jettie Booze,  MD;  Location: [place] [facility];  Service: Cardiovascular;  Laterality: N/A;  . CATARACT EXTRACTION W/PHACO Left 09/25/2016   Procedure: CATARACT EXTRACTION PHACO AND INTRAOCULAR LENS PLACEMENT (IOC);  Surgeon: Eulogio Bear, MD;  Location: [facility];  Service: Ophthalmology;  Laterality: Left;  [place] 46.7 AP% 8.1 CDE 3.80 Fluid pack lot # [id number]  . CATARACT EXTRACTION W/PHACO Right 11/06/2016   Procedure: CATARACT EXTRACTION PHACO AND INTRAOCULAR LENS PLACEMENT ([facility]);  Surgeon: Eulogio Bear, MD;  Location: [facility];  Service: Ophthalmology;  Laterality: Right;  [place] 49.5 AP8.7 CDE4.28 LOT # [id number]  .  CHOLECYSTECTOMY  1987  . COLECTOMY  2001  . CORONARY ANGIOPLASTY  1996  . CORONARY ANGIOPLASTY    . LAPAROSCOPIC BILATERAL SALPINGO OOPHERECTOMY  1988   Dr. Laverta Baltimore  . LEFT HEART CATH AND CORONARY ANGIOGRAPHY N/A 01/25/2018   Procedure: LEFT HEART CATH AND CORONARY ANGIOGRAPHY;  Surgeon: Wellington Hampshire, MD;  Location: [place] [facility];  Service: Cardiovascular;  Laterality: N/A;  . TONSILLECTOMY  1950     Current Outpatient Medications  Medication Sig Dispense Refill  . acetaminophen (TYLENOL) 650 MG CR tablet Take 1,000 mg by mouth 2 (two) times daily.     Marland Kitchen albuterol (VENTOLIN HFA) 108 (90 Base) MCG/ACT inhaler Inhale 2 puffs into the lungs every 4 (four) hours as needed for wheezing or shortness of breath. 18 g 0  . aspirin 81 MG tablet Take 81 mg by mouth daily.    Marland Kitchen atorvastatin (LIPITOR) 80 MG tablet Take 0.5 tablets (40 mg total) by mouth daily at 6 PM. 45 tablet 1  . busPIRone (BUSPAR) 5 MG tablet Take 1 tablet (5 mg total) by mouth 3 (three) times daily. 270 tablet 1  . Cholecalciferol 5000 units TABS Take 5,000 Units by mouth daily.    . clopidogrel (PLAVIX) 75 MG tablet TAKE 1 TABLET BY MOUTH DAILY WITH BREAKFAST 90 tablet 1  . isosorbide mononitrate (IMDUR) 30 MG 24 hr tablet TAKE 1 TABLET(30 MG) BY MOUTH DAILY 90 tablet 2  . loperamide (IMODIUM A-D) 2 MG tablet Take 2 mg by mouth 4 (four) times daily as needed for diarrhea or loose stools.    . metoprolol tartrate (LOPRESSOR) 50 MG tablet TAKE 1 TABLET(50 MG) BY MOUTH TWICE DAILY 180 tablet 2  . mirtazapine (REMERON) 30 MG tablet TAKE 1 TABLET BY MOUTH EVERY NIGHT AT BEDTIME 90 tablet 1  . Multiple Vitamin (MULTIVITAMIN) capsule Take 1 capsule by mouth daily.    . nitroGLYCERIN (NITROSTAT) 0.4 MG SL tablet PLACE 1 TABLET UNDER THE TONGUE EVERY 5 MINUTES AS NEEDED FOR CHEST PAIN, 3 DOSES MAX 25 tablet 1  . Nutritional Supplements (OSTEO ADVANCE) TABS Take by mouth once.    Marland Kitchen omega-3 acid ethyl esters (LOVAZA) 1 g capsule  TAKE 2 CAPSULES BY MOUTH TWICE DAILY 360 capsule 1  . pantoprazole (PROTONIX) 40 MG tablet TAKE 1 TABLET(40 MG) BY MOUTH DAILY 90 tablet 2  . Ubiquinol (QUNOL COQ10/UBIQUINOL/MEGA) 100 MG CAPS Take 100 mg by mouth every morning.    . vitamin B-12 (CYANOCOBALAMIN) 250 MCG tablet Take 250 mcg by mouth daily.     No current facility-administered medications for this visit.   Facility-Administered Medications Ordered in Other Visits  Medication Dose Route Frequency Provider Last Rate Last Admin  . sodium chloride 0.9 % nebulizer solution 3 mL  3 mL Nebulization Once [name], MD       Followed by  . methacholine (PROVOCHOLINE) inhaler  solution 0.125 mg  2 mL Inhalation Once [name], MD       Followed by  . methacholine (PROVOCHOLINE) inhaler solution 0.5 mg  2 mL Inhalation Once [name], MD       Followed by  . methacholine (PROVOCHOLINE) inhaler solution 2 mg  2 mL Inhalation Once [name], MD       Followed by  . methacholine (PROVOCHOLINE) inhaler solution 8 mg  2 mL Inhalation Once [name], MD       Followed by  . methacholine (PROVOCHOLINE) inhaler solution 32 mg  2 mL Inhalation Once [name], MD        Allergies:   Abilify [aripiprazole]    Social History:  The patient  reports that she has never smoked. She has never used smokeless tobacco. She reports that she does not drink alcohol and does not use drugs.   Family History:  The patient's family history includes Cancer in her father; Cancer (age of onset: 26) in her daughter; Diabetes in her brother and sister; Heart disease in her brother; Hypertension in her mother; Stroke (age of onset: 27) in her mother.    ROS:  Please see the history of present illness.   Otherwise, review of systems are positive for none.   All other systems are reviewed and negative.    PHYSICAL EXAM: VS:  BP 120/70 (BP Location: Left Arm, Patient Position: Sitting, Cuff Size: Normal)    Pulse 69   Ht 4\' 11"  (1.499 m)   Wt 138 lb 4 oz (62.7 kg)   SpO2 98%   BMI 27.92 kg/m  , BMI Body mass index is 27.92 kg/m. GEN: Well nourished, well developed, in no acute distress  HEENT: normal  Neck: no JVD, carotid bruits, or masses Cardiac: RRR; no rubs, or gallops,no edema .  3/6 crescendo decrescendo systolic murmur in the aortic area which is mid peaking. Respiratory:  clear to auscultation bilaterally, normal work of breathing GI: soft, nontender, nondistended, + BS MS: no deformity or atrophy  Skin: warm and dry, no rash Neuro:  Strength and sensation are intact Psych: euthymic mood, full affect   EKG:  EKG is ordered today. The ekg ordered today demonstrates sinus rhythm, LVH with repolarization abnormalities   Recent Labs: 11/07/2019: ALT 23 11/17/2019: Magnesium 1.5 02/10/2020: BUN 13; Creatinine, Ser 0.77; Hemoglobin 12.2; Platelets 165.0; Potassium 4.4; Sodium 140; TSH 1.54    Lipid Panel    Component Value Date/Time   CHOL 109 02/21/2019 1012   CHOL 115 09/03/2017 1049   TRIG 204.0 (H) 02/21/2019 1012   HDL 45.40 02/21/2019 1012   HDL 50 09/03/2017 1049   CHOLHDL 2 02/21/2019 1012   VLDL 40.8 (H) 02/21/2019 1012   LDLCALC 20 09/13/2018 0906   LDLCALC 29 09/03/2017 1049   LDLDIRECT 35.0 02/21/2019 1012      Wt Readings from Last 3 Encounters:  05/10/20 138 lb 4 oz (62.7 kg)  05/02/20 136 lb (61.7 kg)  04/24/20 136 lb 6.4 oz (61.9 kg)       No flowsheet data found.    ASSESSMENT AND PLAN:  1.  Coronary artery disease involving native coronary arteries with worsening angina, the patient describes progressive symptoms of worsening exertional chest pain.  She has known history of coronary artery disease and moderate severe aortic stenosis.  I discussed different management options with them and recommended proceeding with a right and left cardiac catheterization possible PCI.  We  were about to schedule this procedure as an outpatient but then the  patient started having chest pain at rest in the exam room with associated dizziness and presyncope.  I rechecked her blood pressure in the sitting position and it was 88/60.  This improved to 100/60 with lying down.  We repeated her EKG which showed no acute changes.  She was not hypoxic. She continued to feel dizzy and lightheaded and could not stand on her own.  Given these symptoms and chest pain at rest, will going to transfer the patient to the ED for evaluation.  2.  Moderate to severe aortic stenosis: Was stable on most recent echocardiogram although there was more regurgitation.  Given her declining memory, I am not entirely sure she will be a good candidate for TAVR of aortic stenosis as a culprit.  3.  Essential hypertension: Blood pressure is controlled on current medications.  4.  Hyperlipidemia: Continue treatment with high-dose atorvastatin.  Most recent LDL was 29.  5.  Memory decline: Possible early dementia.  6.  Worsening anxiety since stopping Celexa.  Managed by Dr. Caryl Bis.   Disposition:   Transfer to the ED for evaluation regarding unstable angina and presyncope.  Signed,  Kathlyn Sacramento, MD  05/10/2020 11:07 AM    [place]

## 2020-05-10 NOTE — Patient Instructions (Signed)
Medication Instructions:  - Your physician recommends that you continue on your current medications as directed. Please refer to the Current Medication list given to you today.  *If you need a refill on your cardiac medications before your next appointment, please call your pharmacy*   Lab Work: - none ordered  If you have labs (blood work) drawn today and your tests are completely normal, you will receive your results only by: Marland Kitchen MyChart Message (if you have MyChart) OR . A paper copy in the mail If you have any lab test that is abnormal or we need to change your treatment, we will call you to review the results.   Testing/Procedures: - none ordered   Follow-Up: At [facility], you and your health needs are our priority.  As part of our continuing mission to provide you with exceptional heart care, we have created designated Provider Care Teams.  These Care Teams include your primary Cardiologist (physician) and Advanced Practice Providers (APPs -  Physician Assistants and Nurse Practitioners) who all work together to provide you with the care you need, when you need it.  We recommend signing up for the patient portal called "MyChart".  Sign up information is provided on this After Visit Summary.  MyChart is used to connect with patients for Virtual Visits (Telemedicine).  Patients are able to view lab/test results, encounter notes, upcoming appointments, etc.  Non-urgent messages can be sent to your provider as well.   To learn more about what you can do with MyChart, go to [email or web address].    Your next appointment:   Pending hospital discharge   The format for your next appointment:   In Person  Provider:   You may see Kathlyn Sacramento, MD or one of the following Advanced Practice Providers on your designated Care Team:    Murray Hodgkins, NP  Christell Faith, PA-C  Marrianne Mood, PA-C  Cadence Kathlen Mody, [place]    Other Instructions - You are being taken to the ER  for sudden onset of chest pain/ hypotension

## 2020-05-10 NOTE — ED Triage Notes (Signed)
Says she had chest pain for just a few minutes while at cardiologist office today and they sent her here.  She denies chest pain now.  Says it was a pulling pain.

## 2020-05-10 NOTE — ED Provider Notes (Signed)
[facility] Emergency Department Provider Note ____________________________________________   First MD Initiated Contact with Patient 05/10/20 1755     (approximate)  I have reviewed the triage vital signs and the nursing notes.   HISTORY  Chief Complaint Chest Pain    HPI KEILANY BURNETTE is a 85 y.o. female with PMH as noted below including CAD and aortic stenosis who presents with an episode of chest pain and near syncope today while at her cardiologist office.  The symptoms started around 11 AM and lasted for a few minutes.  She states that the pain itself lasted for a few seconds and was sharp.  It was substernal and nonradiating.  She has not had any pain while waiting in the ED for the last 6 hours.  She states that she no longer feels lightheaded or near syncopal.  She states that her cardiologist wanted her evaluated in the ER.  Past Medical History:  Diagnosis Date  . Abnormal mammogram 2007   Recommend close follow up  . Anemia    Iron, b12, SPEP, UPEP normal, 12/2012  . Anxiety   . Aortic stenosis    mild to moderate  . aortic stenosis   . Arthritis   . Asthma   . CAD (coronary artery disease)    s/p angioplasty 1996  . Chronic cough   . Depression   . Diverticulosis   . Dyspnea   . Elevated LFTs 2010   s/p ultrasound w/ possible fatty liver; GI consult. Resolution 2011  . Gastric ulcer   . GERD (gastroesophageal reflux disease)   . History of colonic polyps    Dr. Vira Agar  . HOH (hard of hearing)   . Hyperlipidemia   . Hypertension   . Peritoneal carcinomatosis [facility]) 2001   Dr. Oliva Bustard & Dr. Claiborne Rigg s/p chemo  . Shingles 2008   T-9 distribution  . Valvular heart disease    Mild to Moderate MR, AS  . Vitamin D deficiency   . Wheezing     Patient Active Problem List   Diagnosis Date Noted  . Hot flashes 02/10/2020  . Asthma without status asthmaticus 12/15/2019  . Hypertension 12/15/2019  . Prediabetes 02/11/2019   . Memory difficulty 02/08/2018  . Non-rheumatic mitral regurgitation 02/12/2017  . Orthostasis 10/20/2016  . Arthritis 05/22/2016  . COPD (chronic obstructive pulmonary disease) ([place]) 11/21/2015  . Hyponatremia 09/30/2015  . CAD (coronary artery disease) 04/27/2015  . Complex tear of medial meniscus of right knee as current injury 07/26/2014  . Primary osteoarthritis of right knee 07/26/2014  . Anemia 05/12/2014  . Nonrheumatic aortic valve stenosis 04/18/2014  . History of peritoneal carcinoma 04/17/2014  . Chronic diarrhea 04/17/2014  . Dyslipidemia, goal LDL below 70 04/17/2014  . Anxiety and depression 11/13/2013  . Vitamin D deficiency 11/13/2013    Past Surgical History:  Procedure Laterality Date  . ABDOMINAL HYSTERECTOMY  2001  . APPENDECTOMY    . CARDIAC CATHETERIZATION  2007   50% mid LAD stenosis, 70% proximal RCA with 100% distal RCA stenosis with collaterals, EF 65%  . CARDIAC CATHETERIZATION N/A 04/27/2015   Procedure: Left Heart Cath and Coronary Angiography;  Surgeon: Jettie Booze, MD;  Location: [place] [facility];  Service: Cardiovascular;  Laterality: N/A;  . CARDIAC CATHETERIZATION N/A 04/27/2015   Procedure: Coronary Stent Intervention;  Surgeon: Jettie Booze, MD;  Location: [place] [facility];  Service: Cardiovascular;  Laterality: N/A;  . CATARACT EXTRACTION W/PHACO Left 09/25/2016  Procedure: CATARACT EXTRACTION PHACO AND INTRAOCULAR LENS PLACEMENT (IOC);  Surgeon: Eulogio Bear, MD;  Location: [facility] ORS;  Service: Ophthalmology;  Laterality: Left;  [place] 46.7 AP% 8.1 CDE 3.80 Fluid pack lot # [id number]  . CATARACT EXTRACTION W/PHACO Right 11/06/2016   Procedure: CATARACT EXTRACTION PHACO AND INTRAOCULAR LENS PLACEMENT ([place]);  Surgeon: Eulogio Bear, MD;  Location: [facility] ORS;  Service: Ophthalmology;  Laterality: Right;  [place] 49.5 AP8.7 CDE4.28 LOT # [id number]  . CHOLECYSTECTOMY  1987  . COLECTOMY  2001  . CORONARY ANGIOPLASTY  1996  .  CORONARY ANGIOPLASTY    . LAPAROSCOPIC BILATERAL SALPINGO OOPHERECTOMY  1988   Dr. Laverta Baltimore  . LEFT HEART CATH AND CORONARY ANGIOGRAPHY N/A 01/25/2018   Procedure: LEFT HEART CATH AND CORONARY ANGIOGRAPHY;  Surgeon: Wellington Hampshire, MD;  Location: [place] [facility];  Service: Cardiovascular;  Laterality: N/A;  . TONSILLECTOMY  1950    Prior to Admission medications   Medication Sig Start Date End Date Taking? Authorizing Provider  mirtazapine (REMERON) 30 MG tablet TAKE 1 TABLET BY MOUTH EVERY NIGHT AT BEDTIME 03/27/20  Yes [name], MD  acetaminophen (TYLENOL) 650 MG CR tablet Take 1,000 mg by mouth 2 (two) times daily.     [provider]  albuterol (VENTOLIN HFA) 108 (90 Base) MCG/ACT inhaler Inhale 2 puffs into the lungs every 4 (four) hours as needed for wheezing or shortness of breath. 08/19/19   [name], MD  aspirin 81 MG tablet Take 81 mg by mouth daily.    [provider]  atorvastatin (LIPITOR) 80 MG tablet Take 0.5 tablets (40 mg total) by mouth daily at 6 PM. 11/21/19   Wellington Hampshire, MD  busPIRone (BUSPAR) 5 MG tablet Take 1 tablet (5 mg total) by mouth 3 (three) times daily. 04/24/20   [name], MD  Cholecalciferol 5000 units TABS Take 5,000 Units by mouth daily.    [provider]  clopidogrel (PLAVIX) 75 MG tablet TAKE 1 TABLET BY MOUTH DAILY WITH BREAKFAST 03/12/20   Wellington Hampshire, MD  isosorbide mononitrate (IMDUR) 30 MG 24 hr tablet TAKE 1 TABLET(30 MG) BY MOUTH DAILY 12/12/19   Wellington Hampshire, MD  loperamide (IMODIUM A-D) 2 MG tablet Take 2 mg by mouth 4 (four) times daily as needed for diarrhea or loose stools.    [provider]  metoprolol tartrate (LOPRESSOR) 50 MG tablet TAKE 1 TABLET(50 MG) BY MOUTH TWICE DAILY 12/12/19   Wellington Hampshire, MD  Multiple Vitamin (MULTIVITAMIN) capsule Take 1 capsule by mouth daily.    [provider]  nitroGLYCERIN (NITROSTAT) 0.4 MG SL tablet PLACE 1 TABLET  UNDER THE TONGUE EVERY 5 MINUTES AS NEEDED FOR CHEST PAIN, 3 DOSES MAX 11/24/19   Wellington Hampshire, MD  Nutritional Supplements (OSTEO ADVANCE) TABS Take by mouth once.    [provider]  omega-3 acid ethyl esters (LOVAZA) 1 g capsule TAKE 2 CAPSULES BY MOUTH TWICE DAILY 12/12/19   Einar Pheasant, MD  pantoprazole (PROTONIX) 40 MG tablet TAKE 1 TABLET(40 MG) BY MOUTH DAILY 12/12/19   Wellington Hampshire, MD  Ubiquinol (QUNOL COQ10/UBIQUINOL/MEGA) 100 MG CAPS Take 100 mg by mouth every morning.    [provider]  vitamin B-12 (CYANOCOBALAMIN) 250 MCG tablet Take 250 mcg by mouth daily.    [provider]    Allergies Abilify [aripiprazole]  Family History  Problem Relation Age of Onset  . Hypertension Mother   .  Stroke Mother 43       Cerebral Hemorrhage  . Cancer Father        bone cancer  . Diabetes Sister   . Diabetes Brother   . Heart disease Brother   . Cancer Daughter 9       Ovarian cancer  . Colon cancer Neg Hx   . Colon polyps Neg Hx     Social History Social History   Tobacco Use  . Smoking status: Never Smoker  . Smokeless tobacco: Never Used  Vaping Use  . Vaping Use: Never used  Substance Use Topics  . Alcohol use: No    Alcohol/week: 0.0 standard drinks  . Drug use: No    Review of Systems  Constitutional: No fever. Eyes: No redness. ENT: No sore throat. Cardiovascular: Positive for resolved chest pain. Respiratory: Denies shortness of breath. Gastrointestinal: No vomiting or diarrhea.  Genitourinary: Negative for flank pain. Musculoskeletal: Negative for back pain. Skin: Negative for rash. Neurological: Negative for headache.   ____________________________________________   PHYSICAL EXAM:  VITAL SIGNS: ED Triage Vitals  Enc Vitals Group     BP 05/10/20 1225 134/64     Pulse Rate 05/10/20 1225 69     Resp 05/10/20 1225 14     Temp 05/10/20 1225 98.2 F (36.8 C)     Temp Source 05/10/20 1225 Oral     SpO2  05/10/20 1225 96 %     Weight 05/10/20 1226 138 lb 4 oz (62.7 kg)     Height 05/10/20 1226 4\' 11"  (1.499 m)     Head Circumference --      Peak Flow --      Pain Score 05/10/20 1226 0     Pain Loc --      Pain Edu? --      Excl. in [place]? --     Constitutional: Alert and oriented.  Relatively well appearing and in no acute distress. Eyes: Conjunctivae are normal.  Head: Atraumatic. Nose: No congestion/rhinnorhea. Mouth/Throat: Mucous membranes are moist.   Neck: Normal range of motion.  Cardiovascular: Normal rate, regular rhythm.  Systolic aortic murmur.Kermit Balo peripheral circulation. Respiratory: Normal respiratory effort.  No retractions. Lungs CTAB. Gastrointestinal: No distention.  Musculoskeletal: Extremities warm and well perfused.  Neurologic:  Normal speech and language. No gross focal neurologic deficits are appreciated.  Skin:  Skin is warm and dry. No rash noted. Psychiatric: Mood and affect are normal. Speech and behavior are normal.  ____________________________________________   LABS (all labs ordered are listed, but only abnormal results are displayed)  Labs Reviewed  BASIC METABOLIC PANEL - Abnormal; Notable for the following components:      Result Value   Sodium 131 (*)    Chloride 97 (*)    Glucose, Bld 122 (*)    All other components within normal limits  CBC - Abnormal; Notable for the following components:   RBC 3.69 (*)    Hemoglobin 11.6 (*)    HCT 33.1 (*)    All other components within normal limits  TROPONIN I (HIGH SENSITIVITY)  TROPONIN I (HIGH SENSITIVITY)   ____________________________________________  EKG  ED ECG REPORT I, Arta Silence, the attending physician, personally viewed and interpreted this ECG.  Date: 05/10/2020 EKG Time: 1210 Rate: 69 Rhythm: normal sinus rhythm QRS Axis: normal Intervals: normal ST/T Wave abnormalities: normal Narrative Interpretation: no evidence of acute ischemia; no significant change when  compared to EKG of 12/16/2019  ____________________________________________  RADIOLOGY  CXR interpreted  by me shows no focal infiltrate or edema  ____________________________________________   PROCEDURES  Procedure(s) performed: No  Procedures  Critical Care performed: No ____________________________________________   INITIAL IMPRESSION / ASSESSMENT AND PLAN / ED COURSE  Pertinent labs & imaging results that were available during my care of the patient were reviewed by me and considered in my medical decision making (see chart for details).  85 year old female with PMH as noted above including a history of CAD and aortic stenosis presents with a brief and atypical episode of chest pain earlier today which resolved on its own, and was associated with near syncope.  This occurred while she was at her cardiologist office, and she was sent in to the ED for evaluation.  I reviewed the past medical records in Epic including the note from Dr. Velva Harman from earlier today.  The patient is planned for a cardiac catheterization in the near future.  After the episode in the office today, she was sent to the ED for evaluation.  She was briefly hypotensive during this episode.  On exam currently, the patient is overall well-appearing.  Her vital signs are normal except for hypertension.  The physical exam is otherwise unremarkable except for an aortic stenosis murmur.  The patient feels well and has no chest pain or other acute complaints.  The patient waited for over 5 hours prior to being placed in exam room and seen.  During this time work-up was completed including basic labs and troponins x2.  Both are negative.  Chest x-ray shows no acute findings, and the EKG shows no changes.  Overall at this time there is no evidence of MI or any acute ischemia.  The patient feels comfortable and would prefer to go home if possible.  I discussed the case with Dr. Curt Bears from cardiology.  I reviewed the  work-up and clinical presentation with him, and he agreed that the patient would be appropriate for close outpatient follow-up.  I will notify Dr. Fletcher Anon via secure chat.  ----------------------------------------- 7:19 PM on 05/10/2020 -----------------------------------------  Dr. Fletcher Anon responded to my secure chat and agrees with discharge.  He has scheduled the patient for a cardiac catheterization next week.  The patient continues to be asymptomatic.  She is stable for discharge.  Return precautions given, and she expresses understanding.  ____________________________________________   FINAL CLINICAL IMPRESSION(S) / ED DIAGNOSES  Final diagnoses:  Atypical chest pain      NEW MEDICATIONS STARTED DURING THIS VISIT:  New Prescriptions   No medications on file     Note:  This document was prepared using Dragon voice recognition software and may include unintentional dictation errors.    Arta Silence, MD 05/10/20 1919

## 2020-05-11 ENCOUNTER — Telehealth: Payer: Self-pay | Admitting: Cardiovascular Disease

## 2020-05-11 ENCOUNTER — Encounter: Payer: Self-pay | Admitting: *Deleted

## 2020-05-11 NOTE — Telephone Encounter (Signed)
I have called and spoken with the patient's sister in law, [name] (ok per [facility]) and advised her of the patient's cardiac cath instructions. She was expecting the call today and voiced understanding of these instructions.   She is aware an electronic copy will also be sent to the patient's MyChart.

## 2020-05-11 NOTE — Telephone Encounter (Signed)
The patient was seen in clinic yesterday with Dr. Fletcher Anon. She was sent to ER due to acute onset of chest pain and hypotension.  Secure chat message received from Dr. Fletcher Anon today stating the patient was discharged and will need to be set up for an out patient Right & Left heart cath with possible PCI next week on Wednesday 05/16/20.   I have scheduled her as below:     Amy Morton, Amy Morton [id number] Dept: [phone] Loc: [place]  05/11/2020  You are scheduled for a Cardiac Catheterization on Wednesday, October 20 with Dr. Kathlyn Sacramento.  1. Please arrive at the [facility] (Main Entrance A) at [facility]: [address] [place], [place] [address] at 10:30 AM (This time is two hours before your procedure to ensure your preparation). Free valet parking service is available.   Special note: Every effort is made to have your procedure done on time. Please understand that emergencies sometimes delay scheduled procedures.  2. Diet: Do not eat solid foods after midnight.  You may have clear liquids until 5am the day of the procedure.  3. Labs:   Pre-procedure COVID swab:  - Monday 05/14/20 (8:00 am- 1:00 pm) - [facility] at [facility] - this is a drive up test only  4. Medication instructions in preparation for your procedure:   Contrast Allergy: No   - You may take all of your regular medications the morning of your procedure with enough water to get them down safely.   On the morning of your procedure, take your Aspirin and any morning medicines NOT listed above.  You may use sips of water.  5. Plan for one night stay--bring personal belongings. 6. Bring a current list of your medications and current insurance cards. 7. You MUST have a responsible person to drive you home. 8. Someone MUST be with you the first 24 hours after you  arrive home or your discharge will be delayed. 9. Please wear clothes that are easy to get on and off and wear slip-on shoes.  Thank you for allowing [place] to care for you!   -- [facility]

## 2020-05-14 ENCOUNTER — Other Ambulatory Visit: Payer: Self-pay

## 2020-05-14 ENCOUNTER — Other Ambulatory Visit
Admission: RE | Admit: 2020-05-14 | Discharge: 2020-05-14 | Disposition: A | Discharge status: Home / Self Care | Payer: Medicare Other | Source: Ambulatory Visit | Attending: Cardiovascular Disease | Admitting: Cardiovascular Disease

## 2020-05-14 DIAGNOSIS — Z01812 Encounter for preprocedural laboratory examination: Secondary | ICD-10-CM | POA: Diagnosis not present

## 2020-05-14 DIAGNOSIS — Z20822 Contact with and (suspected) exposure to covid-19: Secondary | ICD-10-CM | POA: Diagnosis not present

## 2020-05-15 ENCOUNTER — Telehealth: Payer: Self-pay | Admitting: *Deleted

## 2020-05-15 LAB — SARS CORONAVIRUS 2 (TAT 6-24 HRS): SARS Coronavirus 2: NEGATIVE

## 2020-05-15 NOTE — Telephone Encounter (Addendum)
Pt contacted pre-catheterization scheduled at [facility] for: Wednesday May 16, 2020 12:30 PM Verified arrival time and place: [facility]) at: 10:30 AM   No solid food after midnight prior to cath, clear liquids until 5 AM day of procedure.  AM meds can be  taken pre-cath with sips of water including: ASA 81 mg Plavix 75 mg  Confirmed patient has responsible adult to drive home post procedure and be with patient first 24 hours after arriving home: yes  You are allowed ONE visitor in the waiting room during the time you are at the hospital for your procedure. Both you and your visitor must wear a mask once you enter the hospital.       COVID-19 Pre-Screening Questions:  . In the past 14 days have you had a new cough, new headache, new nasal congestion, fever (100.4 or greater) unexplained body aches, new sore throat, or sudden loss of taste or sense of smell? no . In the past 14 days have you been around anyone with known Covid 19? no   Reviewed procedure/mask/visitor instructions, COVID-19 questions with Ms Way (DPR).

## 2020-05-16 ENCOUNTER — Encounter (HOSPITAL_COMMUNITY)
Admission: RE | Disposition: A | Discharge status: Home / Self Care | Payer: Self-pay | Source: Home / Self Care | Attending: Cardiovascular Disease

## 2020-05-16 ENCOUNTER — Ambulatory Visit (HOSPITAL_COMMUNITY)
Admission: RE | Admit: 2020-05-16 | Discharge: 2020-05-16 | Disposition: A | Discharge status: Home / Self Care | Payer: Medicare Other | Attending: Cardiovascular Disease | Admitting: Cardiovascular Disease

## 2020-05-16 DIAGNOSIS — I350 Nonrheumatic aortic (valve) stenosis: Secondary | ICD-10-CM

## 2020-05-16 DIAGNOSIS — I209 Angina pectoris, unspecified: Secondary | ICD-10-CM

## 2020-05-16 DIAGNOSIS — Z7982 Long term (current) use of aspirin: Secondary | ICD-10-CM | POA: Diagnosis not present

## 2020-05-16 DIAGNOSIS — Z7902 Long term (current) use of antithrombotics/antiplatelets: Secondary | ICD-10-CM | POA: Diagnosis not present

## 2020-05-16 DIAGNOSIS — E559 Vitamin D deficiency, unspecified: Secondary | ICD-10-CM | POA: Insufficient documentation

## 2020-05-16 DIAGNOSIS — F419 Anxiety disorder, unspecified: Secondary | ICD-10-CM | POA: Insufficient documentation

## 2020-05-16 DIAGNOSIS — E785 Hyperlipidemia, unspecified: Secondary | ICD-10-CM | POA: Diagnosis not present

## 2020-05-16 DIAGNOSIS — I25119 Atherosclerotic heart disease of native coronary artery with unspecified angina pectoris: Secondary | ICD-10-CM | POA: Diagnosis not present

## 2020-05-16 DIAGNOSIS — J45909 Unspecified asthma, uncomplicated: Secondary | ICD-10-CM | POA: Diagnosis not present

## 2020-05-16 DIAGNOSIS — D649 Anemia, unspecified: Secondary | ICD-10-CM | POA: Insufficient documentation

## 2020-05-16 DIAGNOSIS — F329 Major depressive disorder, single episode, unspecified: Secondary | ICD-10-CM | POA: Diagnosis not present

## 2020-05-16 DIAGNOSIS — I10 Essential (primary) hypertension: Secondary | ICD-10-CM | POA: Insufficient documentation

## 2020-05-16 DIAGNOSIS — I25110 Atherosclerotic heart disease of native coronary artery with unstable angina pectoris: Secondary | ICD-10-CM | POA: Insufficient documentation

## 2020-05-16 DIAGNOSIS — Z955 Presence of coronary angioplasty implant and graft: Secondary | ICD-10-CM | POA: Diagnosis not present

## 2020-05-16 DIAGNOSIS — K219 Gastro-esophageal reflux disease without esophagitis: Secondary | ICD-10-CM | POA: Diagnosis not present

## 2020-05-16 DIAGNOSIS — Z79899 Other long term (current) drug therapy: Secondary | ICD-10-CM | POA: Diagnosis not present

## 2020-05-16 HISTORY — PX: RIGHT/LEFT HEART CATH AND CORONARY ANGIOGRAPHY: CATH118266

## 2020-05-16 LAB — POCT I-STAT EG7
Acid-Base Excess: 1 mmol/L (ref 0.0–2.0)
Bicarbonate: 26.5 mmol/L (ref 20.0–28.0)
Calcium, Ion: 1.12 mmol/L — ABNORMAL LOW (ref 1.15–1.40)
HCT: 32 % — ABNORMAL LOW (ref 36.0–46.0)
Hemoglobin: 10.9 g/dL — ABNORMAL LOW (ref 12.0–15.0)
O2 Saturation: 68 %
Potassium: 3.1 mmol/L — ABNORMAL LOW (ref 3.5–5.1)
Sodium: 141 mmol/L (ref 135–145)
TCO2: 28 mmol/L (ref 22–32)
pCO2, Ven: 44.1 mmHg (ref 44.0–60.0)
pH, Ven: 7.387 (ref 7.250–7.430)
pO2, Ven: 36 mmHg (ref 32.0–45.0)

## 2020-05-16 LAB — POCT I-STAT 7, (LYTES, BLD GAS, ICA,H+H)
Acid-Base Excess: 1 mmol/L (ref 0.0–2.0)
Bicarbonate: 26.2 mmol/L (ref 20.0–28.0)
Calcium, Ion: 1.11 mmol/L — ABNORMAL LOW (ref 1.15–1.40)
HCT: 32 % — ABNORMAL LOW (ref 36.0–46.0)
Hemoglobin: 10.9 g/dL — ABNORMAL LOW (ref 12.0–15.0)
O2 Saturation: 100 %
Potassium: 3 mmol/L — ABNORMAL LOW (ref 3.5–5.1)
Sodium: 141 mmol/L (ref 135–145)
TCO2: 27 mmol/L (ref 22–32)
pCO2 arterial: 41.1 mmHg (ref 32.0–48.0)
pH, Arterial: 7.413 (ref 7.350–7.450)
pO2, Arterial: 249 mmHg — ABNORMAL HIGH (ref 83.0–108.0)

## 2020-05-16 SURGERY — RIGHT/LEFT HEART CATH AND CORONARY ANGIOGRAPHY
Anesthesia: LOCAL

## 2020-05-16 MED ORDER — ASPIRIN 81 MG PO CHEW: 81.0000 mg | CHEWABLE_TABLET | ORAL | Status: DC

## 2020-05-16 MED ORDER — SODIUM CHLORIDE 0.9 % IV SOLN: INTRAVENOUS | Status: DC

## 2020-05-16 MED ORDER — SODIUM CHLORIDE 0.9 % WEIGHT BASED INFUSION
3.0000 mL/kg/h | INTRAVENOUS | Status: AC
  Administered 2020-05-16: 3 mL/kg/h via INTRAVENOUS

## 2020-05-16 MED ORDER — SODIUM CHLORIDE 0.9 % IV SOLN: 250.0000 mL | INTRAVENOUS | Status: DC | PRN

## 2020-05-16 MED ORDER — FENTANYL CITRATE (PF) 100 MCG/2ML IJ SOLN
INTRAMUSCULAR | Status: AC
  Filled 2020-05-16: qty 2

## 2020-05-16 MED ORDER — LIDOCAINE HCL (PF) 1 % IJ SOLN
INTRAMUSCULAR | Status: AC
  Filled 2020-05-16: qty 30

## 2020-05-16 MED ORDER — HEPARIN SODIUM (PORCINE) 1000 UNIT/ML IJ SOLN
INTRAMUSCULAR | Status: AC
  Filled 2020-05-16: qty 1

## 2020-05-16 MED ORDER — HEPARIN SODIUM (PORCINE) 1000 UNIT/ML IJ SOLN
INTRAMUSCULAR | Status: DC | PRN
  Administered 2020-05-16: 3000 [IU] via INTRAVENOUS

## 2020-05-16 MED ORDER — IOHEXOL 350 MG/ML SOLN
INTRAVENOUS | Status: DC | PRN
  Administered 2020-05-16: 50 mL

## 2020-05-16 MED ORDER — FENTANYL CITRATE (PF) 100 MCG/2ML IJ SOLN
INTRAMUSCULAR | Status: DC | PRN
  Administered 2020-05-16: 25 ug via INTRAVENOUS

## 2020-05-16 MED ORDER — LABETALOL HCL 5 MG/ML IV SOLN: 10.0000 mg | INTRAVENOUS | Status: DC | PRN

## 2020-05-16 MED ORDER — SODIUM CHLORIDE 0.9% FLUSH: 3.0000 mL | INTRAVENOUS | Status: DC | PRN

## 2020-05-16 MED ORDER — MIDAZOLAM HCL 2 MG/2ML IJ SOLN
INTRAMUSCULAR | Status: DC | PRN
  Administered 2020-05-16: 1 mg via INTRAVENOUS

## 2020-05-16 MED ORDER — ACETAMINOPHEN 325 MG PO TABS: 650.0000 mg | ORAL_TABLET | ORAL | Status: DC | PRN

## 2020-05-16 MED ORDER — LIDOCAINE HCL (PF) 1 % IJ SOLN
INTRAMUSCULAR | Status: DC | PRN
  Administered 2020-05-16 (×2): 2 mL

## 2020-05-16 MED ORDER — HEPARIN (PORCINE) IN NACL 1000-0.9 UT/500ML-% IV SOLN
INTRAVENOUS | Status: DC | PRN
  Administered 2020-05-16 (×2): 500 mL

## 2020-05-16 MED ORDER — MIDAZOLAM HCL 2 MG/2ML IJ SOLN
INTRAMUSCULAR | Status: AC
  Filled 2020-05-16: qty 2

## 2020-05-16 MED ORDER — ONDANSETRON HCL 4 MG/2ML IJ SOLN: 4.0000 mg | Freq: Four times a day (QID) | INTRAMUSCULAR | Status: DC | PRN

## 2020-05-16 MED ORDER — VERAPAMIL HCL 2.5 MG/ML IV SOLN
INTRAVENOUS | Status: DC | PRN
  Administered 2020-05-16: 10 mL via INTRA_ARTERIAL

## 2020-05-16 MED ORDER — CLOPIDOGREL BISULFATE 75 MG PO TABS: 75.0000 mg | ORAL_TABLET | ORAL | Status: DC

## 2020-05-16 MED ORDER — VERAPAMIL HCL 2.5 MG/ML IV SOLN
INTRAVENOUS | Status: AC
  Filled 2020-05-16: qty 2

## 2020-05-16 MED ORDER — HEPARIN (PORCINE) IN NACL 1000-0.9 UT/500ML-% IV SOLN
INTRAVENOUS | Status: AC
  Filled 2020-05-16: qty 1000

## 2020-05-16 MED ORDER — SODIUM CHLORIDE 0.9% FLUSH: 3.0000 mL | Freq: Two times a day (BID) | INTRAVENOUS | Status: DC

## 2020-05-16 MED ORDER — SODIUM CHLORIDE 0.9 % WEIGHT BASED INFUSION: 1.0000 mL/kg/h | INTRAVENOUS | Status: DC

## 2020-05-16 SURGICAL SUPPLY — 17 items
CATH INFINITI 5 FR JL3.5 (CATHETERS) ×2 IMPLANT
CATH INFINITI 5FR AL1 (CATHETERS) ×2 IMPLANT
CATH INFINITI 5FR JK (CATHETERS) ×2 IMPLANT
CATH SWAN GANZ 7F STRAIGHT (CATHETERS) ×2 IMPLANT
DEVICE RAD COMP TR BAND LRG (VASCULAR PRODUCTS) ×2 IMPLANT
GLIDESHEATH SLEND SS 6F .021 (SHEATH) ×2 IMPLANT
GLIDESHEATH SLENDER 7FR .021G (SHEATH) ×2 IMPLANT
GUIDEWIRE .025 260CM (WIRE) ×2 IMPLANT
GUIDEWIRE INQWIRE 1.5J.035X260 (WIRE) ×1 IMPLANT
INQWIRE 1.5J .035X260CM (WIRE) ×2
KIT HEART LEFT (KITS) ×2 IMPLANT
PACK CARDIAC CATHETERIZATION (CUSTOM PROCEDURE TRAY) ×2 IMPLANT
SHEATH PROBE COVER 6X72 (BAG) ×2 IMPLANT
TRANSDUCER W/STOPCOCK (MISCELLANEOUS) ×2 IMPLANT
TUBING ART PRESS 72  MALE/MALE (TUBING) ×2 IMPLANT
TUBING CIL FLEX 10 FLL-RA (TUBING) ×2 IMPLANT
WIRE EMERALD ST .035X150CM (WIRE) ×2 IMPLANT

## 2020-05-16 NOTE — Progress Notes (Addendum)
Client confused as to date and place; Dr Fletcher Anon notified; no new orders, per Dr Fletcher Anon ok for client to have no IV and no post IV fluids; per Dr Fletcher Anon client has dementia and family has noted client being confused

## 2020-05-16 NOTE — Discharge Instructions (Signed)
Radial Site Care  This sheet gives you information about how to care for yourself after your procedure. Your health care provider may also give you more specific instructions. If you have problems or questions, contact your health care provider. What can I expect after the procedure? After the procedure, it is common to have:  Bruising and tenderness at the catheter insertion area. Follow these instructions at home: Medicines  Take over-the-counter and prescription medicines only as told by your health care provider. Insertion site care  Follow instructions from your health care provider about how to take care of your insertion site. Make sure you: ? Wash your hands with soap and water before you change your bandage (dressing). If soap and water are not available, use hand sanitizer. ? Change your dressing as told by your health care provider. ? Leave stitches (sutures), skin glue, or adhesive strips in place. These skin closures may need to stay in place for 2 weeks or longer. If adhesive strip edges start to loosen and curl up, you may trim the loose edges. Do not remove adhesive strips completely unless your health care provider tells you to do that.  Check your insertion site every day for signs of infection. Check for: ? Redness, swelling, or pain. ? Fluid or blood. ? Pus or a bad smell. ? Warmth.  Do not take baths, swim, or use a hot tub until your health care provider approves.  You may shower 24-48 hours after the procedure, or as directed by your health care provider. ? Remove the dressing and gently wash the site with plain soap and water. ? Pat the area dry with a clean towel. ? Do not rub the site. That could cause bleeding.  Do not apply powder or lotion to the site. Activity   For 24 hours after the procedure, or as directed by your health care provider: ? Do not flex or bend the affected arm. ? Do not push or pull heavy objects with the affected arm. ? Do not  drive yourself home from the hospital or clinic. You may drive 24 hours after the procedure unless your health care provider tells you not to. ? Do not operate machinery or power tools.  Do not lift anything that is heavier than 10 lb (4.5 kg), or the limit that you are told, until your health care provider says that it is safe.  Ask your health care provider when it is okay to: ? Return to work or school. ? Resume usual physical activities or sports. ? Resume sexual activity. General instructions  If the catheter site starts to bleed, raise your arm and put firm pressure on the site. If the bleeding does not stop, get help right away. This is a medical emergency.  If you went home on the same day as your procedure, a responsible adult should be with you for the first 24 hours after you arrive home.  Keep all follow-up visits as told by your health care provider. This is important. Contact a health care provider if:  You have a fever.  You have redness, swelling, or yellow drainage around your insertion site. Get help right away if:  You have unusual pain at the radial site.  The catheter insertion area swells very fast.  The insertion area is bleeding, and the bleeding does not stop when you hold steady pressure on the area.  Your arm or hand becomes pale, cool, tingly, or numb. These symptoms may represent a serious problem   that is an emergency. Do not wait to see if the symptoms will go away. Get medical help right away. Call your local emergency services (911 in the [place] Do not drive yourself to the hospital. Summary  After the procedure, it is common to have bruising and tenderness at the site.  Follow instructions from your health care provider about how to take care of your radial site wound. Check the wound every day for signs of infection.  Do not lift anything that is heavier than 10 lb (4.5 kg), or the limit that you are told, until your health care provider says  that it is safe. This information is not intended to replace advice given to you by your health care provider. Make sure you discuss any questions you have with your health care provider. Document Revised: 08/19/2017 Document Reviewed: 08/19/2017 Elsevier Patient Education  2020 [employer]  

## 2020-05-16 NOTE — Interval H&P Note (Signed)
Cath Lab Visit (complete for each Cath Lab visit)  Clinical Evaluation Leading to the Procedure:   ACS: No.  Non-ACS:    Anginal Classification: CCS III  Anti-ischemic medical therapy: Maximal Therapy (2 or more classes of medications)  Non-Invasive Test Results: No non-invasive testing performed  Prior CABG: No previous CABG      History and Physical Interval Note:  05/16/2020 2:37 PM  Amy Morton  has presented today for surgery, with the diagnosis of angina.  The various methods of treatment have been discussed with the patient and family. After consideration of risks, benefits and other options for treatment, the patient has consented to  Procedure(s): RIGHT/LEFT HEART CATH AND CORONARY ANGIOGRAPHY (N/A) as a surgical intervention.  The patient's history has been reviewed, patient examined, no change in status, stable for surgery.  I have reviewed the patient's chart and labs.  Questions were answered to the patient's satisfaction.     Kathlyn Sacramento

## 2020-05-17 ENCOUNTER — Encounter (HOSPITAL_COMMUNITY): Payer: Self-pay | Admitting: Cardiovascular Disease

## 2020-05-24 ENCOUNTER — Other Ambulatory Visit: Payer: Self-pay | Admitting: Cardiovascular Disease

## 2020-05-30 ENCOUNTER — Other Ambulatory Visit: Payer: Self-pay

## 2020-05-30 ENCOUNTER — Ambulatory Visit (INDEPENDENT_AMBULATORY_CARE_PROVIDER_SITE_OTHER): Payer: Medicare Other | Admitting: Family

## 2020-05-30 ENCOUNTER — Encounter: Payer: Self-pay | Admitting: Family

## 2020-05-30 VITALS — BP 152/70 | HR 78 | Ht 59.0 in | Wt 135.0 lb

## 2020-05-30 DIAGNOSIS — F419 Anxiety disorder, unspecified: Secondary | ICD-10-CM

## 2020-05-30 DIAGNOSIS — I350 Nonrheumatic aortic (valve) stenosis: Secondary | ICD-10-CM | POA: Diagnosis not present

## 2020-05-30 DIAGNOSIS — I2510 Atherosclerotic heart disease of native coronary artery without angina pectoris: Secondary | ICD-10-CM

## 2020-05-30 DIAGNOSIS — I25118 Atherosclerotic heart disease of native coronary artery with other forms of angina pectoris: Secondary | ICD-10-CM

## 2020-05-30 NOTE — Progress Notes (Signed)
Office Visit    Patient Name: Amy Morton Date of Encounter: 05/31/2020  Primary Care Provider:  [name], MD Primary Cardiologist:  Kathlyn Sacramento, MD Electrophysiologist:  None   Chief Complaint    Amy Morton is a 85 y.o. female with a hx of CAD, aortic stenosis, anemia, HTN, HLD, anxiety, depression presents today for follow-up after cardiac catheterization  Past Medical History    Past Medical History:  Diagnosis Date  . Abnormal mammogram 2007   Recommend close follow up  . Anemia    Iron, b12, SPEP, UPEP normal, 12/2012  . Anxiety   . Aortic stenosis    mild to moderate  . aortic stenosis   . Arthritis   . Asthma   . CAD (coronary artery disease)    s/p angioplasty 1996  . Chronic cough   . Depression   . Diverticulosis   . Dyspnea   . Elevated LFTs 2010   s/p ultrasound w/ possible fatty liver; GI consult. Resolution 2011  . Gastric ulcer   . GERD (gastroesophageal reflux disease)   . History of colonic polyps    Dr. Vira Agar  . HOH (hard of hearing)   . Hyperlipidemia   . Hypertension   . Peritoneal carcinomatosis [facility]) 2001   Dr. Oliva Bustard & Dr. Claiborne Rigg s/p chemo  . Shingles 2008   T-9 distribution  . Valvular heart disease    Mild to Moderate MR, AS  . Vitamin D deficiency   . Wheezing    Past Surgical History:  Procedure Laterality Date  . ABDOMINAL HYSTERECTOMY  2001  . APPENDECTOMY    . CARDIAC CATHETERIZATION  2007   50% mid LAD stenosis, 70% proximal RCA with 100% distal RCA stenosis with collaterals, EF 65%  . CARDIAC CATHETERIZATION N/A 04/27/2015   Procedure: Left Heart Cath and Coronary Angiography;  Surgeon: Jettie Booze, MD;  Location: [place] [facility];  Service: Cardiovascular;  Laterality: N/A;  . CARDIAC CATHETERIZATION N/A 04/27/2015   Procedure: Coronary Stent Intervention;  Surgeon: Jettie Booze, MD;  Location: [place] [facility];  Service: Cardiovascular;  Laterality: N/A;  . CATARACT  EXTRACTION W/PHACO Left 09/25/2016   Procedure: CATARACT EXTRACTION PHACO AND INTRAOCULAR LENS PLACEMENT (IOC);  Surgeon: Eulogio Bear, MD;  Location: [facility];  Service: Ophthalmology;  Laterality: Left;  [place] 46.7 AP% 8.1 CDE 3.80 Fluid pack lot # [id number]  . CATARACT EXTRACTION W/PHACO Right 11/06/2016   Procedure: CATARACT EXTRACTION PHACO AND INTRAOCULAR LENS PLACEMENT ([place]);  Surgeon: Eulogio Bear, MD;  Location: [facility];  Service: Ophthalmology;  Laterality: Right;  [place] 49.5 AP8.7 CDE4.28 LOT # [id number]  . CHOLECYSTECTOMY  1987  . COLECTOMY  2001  . CORONARY ANGIOPLASTY  1996  . CORONARY ANGIOPLASTY    . LAPAROSCOPIC BILATERAL SALPINGO OOPHERECTOMY  1988   Dr. Laverta Baltimore  . LEFT HEART CATH AND CORONARY ANGIOGRAPHY N/A 01/25/2018   Procedure: LEFT HEART CATH AND CORONARY ANGIOGRAPHY;  Surgeon: Wellington Hampshire, MD;  Location: [place] [facility];  Service: Cardiovascular;  Laterality: N/A;  . RIGHT/LEFT HEART CATH AND CORONARY ANGIOGRAPHY N/A 05/16/2020   Procedure: RIGHT/LEFT HEART CATH AND CORONARY ANGIOGRAPHY;  Surgeon: Wellington Hampshire, MD;  Location: [place] [facility];  Service: Cardiovascular;  Laterality: N/A;  . TONSILLECTOMY  1950    Allergies  Allergies  Allergen Reactions  . Abilify [Aripiprazole] Other (See Comments)    Burning in Chest Elevated b/p Insomnia    History of  Present Illness    Amy Morton is a 85 y.o. female with a hx of  CAD, aortic stenosis, anemia, HTN, HLD, anxiety, depression last seen 05/16/2020 for cardiac catheterization by Dr. Fletcher Anon.  Her CAD is s/p left circumflex PCI in 2016 with known occluded distal RCA.  He was hospitalized June 2019 with unstable angina.  Cardiac catheterization with patent left circumflex stent without significant restenosis, stable discrete 70% stenosis in the mid LAD and chronically occluded distal RCA with collaterals.  Aortic stenosis was moderate that time with peak gradient of 20 mmHg, LVEDP mildly  elevated.  Echo 03/06/2020 with LVEF greater than 75%, severe LVH, grade 1 diastolic dysfunction, RV SF low normal, RV normal size, normal PASP, LA mildly dilated, moderate MR, moderate to severe AI with moderate aortic stenosis (VTI 1.18 cm, mean gradient 26.0 mmHg, V-max 3.47 m/s).   She was seen in follow-up 05/10/2020 by Dr. Fletcher Anon.  She noted progressive exertional chest tightness described as burning with minimal activities.  Her EKG was without acute changes.  She had dizziness and presyncope in the office and was noted to be markedly hypotensive.  She was transferred to the ED for evaluation.  She was then scheduled for cardiac catheterization.  She had right and left heart cath 05/16/2020 with no significant change in coronary anatomy since 2019. Noted CTO of RCA with left-to-right collateral, patent left circumflex stents with minimal restenosis, moderate LAD disease. Her right heart catheterization showed low filling pressures, normal pulmonary pressure, normal cardiac output and severe aortic stenosis with mean gradient of 30 mmHg and valve area 0.8 cm^2  Presents today with her friend [name]. She lost her husband 10 years ago and her daughter in 2018 and does not have very much family left.  We reviewed catheterization report in detail.  Reviewed severe aortic stenosis and treatment plan options.  Predominantly discussion centered around TAVR versus no intervention. She reports continued dyspnea on exertion as well as lightheadedness.  We discussed the natural progression of aortic stenosis.  She continues to have difficulties with anxiety and depression does not feel that BuSpar is working as well as Celexa and has been taking twice per day, Celexa previously discontinued due to hyponatremia.  She notes continued problems with her memory which is worsened over the last 2 years but has not seen neurology.  EKGs/Labs/Other Studies Reviewed:   The following studies were reviewed  today:  Cardiac catheterization 05/16/2020  Prox LAD lesion is 40% stenosed.  Mid LAD lesion is 60% stenosed.  Ost 2nd Diag to 2nd Diag lesion is 70% stenosed.  Ost Cx to Prox Cx lesion is 40% stenosed.  Non-stenotic Mid Cx lesion was previously treated.  Prox RCA to Mid RCA lesion is 70% stenosed.  Mid RCA to Dist RCA lesion is 100% stenosed.   1.  No significant change in coronary anatomy since 2019.  Chronically occluded distal right coronary artery with left-to-right collaterals, patent left circumflex stents with minimal restenosis, moderate LAD disease.  The coronary arteries are heavily calcified. 2.  Left ventricular angiography was not performed.  EF was normal by echo. 3.  Right heart catheterization showed low filling pressures, normal pulmonary pressure and normal cardiac output. 4.  Severe aortic stenosis with a mean gradient of 30 mmHg and valve area of 0.8 cm.   Recommendations: Continue medical therapy for coronary artery disease. We will discuss with the patient and family to see if they want to proceed with evaluation regarding TAVR.  Echo  03/06/2020 1. Left ventricular ejection fraction, by estimation, is >75%. The left  ventricle has hyperdynamic function. Left ventricular endocardial border  not optimally defined to evaluate regional wall motion. There is severe  left ventricular hypertrophy. Left  ventricular diastolic parameters are consistent with Grade I diastolic  dysfunction (impaired relaxation). Elevated left atrial pressure.   2. Right ventricular systolic function is low normal. The right  ventricular size is normal. There is normal pulmonary artery systolic  pressure.   3. Left atrial size was mildly dilated.   4. The mitral valve is degenerative. Moderate mitral valve regurgitation.   5. The aortic valve has an indeterminant number of cusps. Aortic valve  regurgitation is moderate to severe. Moderate aortic valve stenosis.  Aortic valve area,  by VTI measures 1.18 cm. Aortic valve mean gradient  measures 26.0 mmHg. Aortic valve Vmax  measures 3.47 m/s.   6. The inferior vena cava is normal in size with greater than 50%  respiratory variability, suggesting right atrial pressure of 3 mmHg.   EKG:  EKG is  ordered today.  The ekg ordered today demonstrates NSR 78 bpm with left axis deviation and LVH.  Recent Labs: 11/07/2019: ALT 23 11/17/2019: Magnesium 1.5 02/10/2020: TSH 1.54 05/10/2020: BUN 11; Creatinine, Ser 0.75; Platelets 191 05/16/2020: Hemoglobin 10.9; Potassium 3.0; Sodium 141  Recent Lipid Panel    Component Value Date/Time   CHOL 109 02/21/2019 1012   CHOL 115 09/03/2017 1049   TRIG 204.0 (H) 02/21/2019 1012   HDL 45.40 02/21/2019 1012   HDL 50 09/03/2017 1049   CHOLHDL 2 02/21/2019 1012   VLDL 40.8 (H) 02/21/2019 1012   LDLCALC 20 09/13/2018 0906   LDLCALC 29 09/03/2017 1049   LDLDIRECT 35.0 02/21/2019 1012    Home Medications   Current Meds  Medication Sig  . acetaminophen (TYLENOL) 500 MG tablet Take 1,000 mg by mouth in the morning and at bedtime.  Marland Kitchen albuterol (VENTOLIN HFA) 108 (90 Base) MCG/ACT inhaler Inhale 2 puffs into the lungs every 4 (four) hours as needed for wheezing or shortness of breath.  Marland Kitchen aspirin 81 MG tablet Take 81 mg by mouth daily.  Marland Kitchen atorvastatin (LIPITOR) 80 MG tablet TAKE 1/2 TABLET(40 MG) BY MOUTH DAILY AT 6 PM  . busPIRone (BUSPAR) 5 MG tablet Take 1 tablet (5 mg total) by mouth 3 (three) times daily.  . carboxymethylcellul-glycerin (REFRESH RELIEVA) 0.5-0.9 % ophthalmic solution Place 1 drop into both eyes daily as needed for dry eyes.  . Cholecalciferol 5000 units TABS Take 5,000 Units by mouth daily.  . clopidogrel (PLAVIX) 75 MG tablet TAKE 1 TABLET BY MOUTH DAILY WITH BREAKFAST (Patient taking differently: Take 75 mg by mouth daily. )  . isosorbide mononitrate (IMDUR) 30 MG 24 hr tablet TAKE 1 TABLET(30 MG) BY MOUTH DAILY (Patient taking differently: Take 30 mg by mouth  daily. )  . loperamide (IMODIUM A-D) 2 MG tablet Take 2 mg by mouth 4 (four) times daily as needed for diarrhea or loose stools.  . metoprolol tartrate (LOPRESSOR) 50 MG tablet TAKE 1 TABLET(50 MG) BY MOUTH TWICE DAILY (Patient taking differently: Take 50 mg by mouth 2 (two) times daily. )  . mirtazapine (REMERON) 30 MG tablet TAKE 1 TABLET BY MOUTH EVERY NIGHT AT BEDTIME (Patient taking differently: Take 30 mg by mouth at bedtime. )  . Multiple Vitamin (MULTIVITAMIN) capsule Take 1 capsule by mouth daily.  . nitroGLYCERIN (NITROSTAT) 0.4 MG SL tablet PLACE 1 TABLET UNDER THE TONGUE EVERY 5 MINUTES  AS NEEDED FOR CHEST PAIN, 3 DOSES MAX (Patient taking differently: Place 0.4 mg under the tongue every 5 (five) minutes as needed for chest pain. EVERY 5 MINUTES AS NEEDED FOR CHEST PAIN, 3 DOSES MAX)  . Nutritional Supplements (OSTEO ADVANCE) TABS Take 1 tablet by mouth daily.   Marland Kitchen omega-3 acid ethyl esters (LOVAZA) 1 g capsule TAKE 2 CAPSULES BY MOUTH TWICE DAILY (Patient taking differently: Take 2 g by mouth 2 (two) times daily. )  . pantoprazole (PROTONIX) 40 MG tablet TAKE 1 TABLET(40 MG) BY MOUTH DAILY (Patient taking differently: Take 40 mg by mouth daily. )  . Ubiquinol (QUNOL COQ10/UBIQUINOL/MEGA) 100 MG CAPS Take 100 mg by mouth every morning.  . vitamin B-12 (CYANOCOBALAMIN) 250 MCG tablet Take 250 mcg by mouth daily.     Review of Systems   Review of Systems  Constitutional: Negative for chills, fever and malaise/fatigue.  Cardiovascular: Positive for dyspnea on exertion. Negative for chest pain, leg swelling, near-syncope, orthopnea, palpitations and syncope.  Respiratory: Positive for shortness of breath. Negative for cough and wheezing.   Gastrointestinal: Negative for nausea and vomiting.  Neurological: Positive for dizziness and light-headedness. Negative for weakness.  Psychiatric/Behavioral: Positive for memory loss.   All other systems reviewed and are otherwise negative except as  noted above.  Physical Exam    VS:  BP (!) 152/70 (BP Location: Left Arm, Patient Position: Sitting, Cuff Size: Normal)   Pulse 78   Ht 4\' 11"  (1.499 m)   Wt 135 lb (61.2 kg)   SpO2 96%   BMI 27.27 kg/m  , BMI Body mass index is 27.27 kg/m.  Wt Readings from Last 3 Encounters:  05/30/20 135 lb (61.2 kg)  05/16/20 134 lb (60.8 kg)  05/10/20 138 lb 4 oz (62.7 kg)    GEN: Well nourished, well developed, in no acute distress. HEENT: normal. Neck: Supple, no JVD, carotid bruits, or masses. Cardiac: RRR, no  rubs, or gallops.  3 out of 6 systolic murmur best auscultated at RUSB, no clubbing, cyanosis, edema.  Radials/DP/PT 2+ and equal bilaterally.  Respiratory:  Respirations regular and unlabored, clear to auscultation bilaterally. GI: Soft, nontender, nondistended. MS: No deformity or atrophy. Skin: Warm and dry, no rash.  Right cath site clean, dry, intact with no hematoma, ecchymosis, signs of infection. Neuro:  Strength and sensation are intact. Psych: Normal affect.  Assessment & Plan    1. CAD - LHC 05/16/2020 with stable coronary anatomy. Known CTO of RCA with left-to-right collateral, patent left circumflex stent with minimal restenosis, moderate LAD disease.  GDMT includes aspirin, beta-blocker, statin, Imdur. Severe aortic stenosis likely driving majority of her her symptoms, see below.  2. Anxiety/depression -continue to follow with primary care.  Does not feel Buspar is working as well as Celexa (previously discontinued due to hyponatremia) and is taking only twice per day instead of 3 times per day.  Encouraged to discuss with her primary care provider and will write him a note to make him aware.  3. Severe aortic stenosis -RHC 05/16/2020 with severe aortic stenosis with mean gradient 30mmHg and valve area 0.8 cm. She reports lightheadedness and dyspnea on exertion.  Long discussion regarding no intervention with life expectancy 12-24 months versus TAVR.  She is uncertain  which direction she wants to proceed. Her friend ("sister by choice") [name] was present for our visit today. Encouraged to discuss with her family members. She is interested in meeting the Structural Heart/Valve Team and referral has been placed.  Due to severe AS we will opt for some element of permissive hypertension.   4. Memory problems - Encouraged to follow up with PCP. Worsening over last 2 years. May benefit from referral to neurology. We discussed the role that anxiety and depression can have on cognition.  Disposition: Refer to valve clinic for consideration of TAVR. Follow up in 2 month(s) with Dr. Fletcher Anon or APP  Signed, Loel Dubonnet, NP 05/31/2020, 8:29 AM [place]

## 2020-05-30 NOTE — Patient Instructions (Addendum)
Medication Instructions:  No medication changes today.   *If you need a refill on your cardiac medications before your next appointment, please call your pharmacy*   Lab Work: None ordered today.  Testing/Procedures: None ordered today. You have been referred to the valvular heart clinic for consideration of TAVR (transfemoral aortic valve replacement).  Follow-Up: At [facility], you and your health needs are our priority.  As part of our continuing mission to provide you with exceptional heart care, we have created designated Provider Care Teams.  These Care Teams include your primary Cardiologist (physician) and Advanced Practice Providers (APPs -  Physician Assistants and Nurse Practitioners) who all work together to provide you with the care you need, when you need it.  We recommend signing up for the patient portal called "MyChart".  Sign up information is provided on this After Visit Summary.  MyChart is used to connect with patients for Virtual Visits (Telemedicine).  Patients are able to view lab/test results, encounter notes, upcoming appointments, etc.  Non-urgent messages can be sent to your provider as well.   To learn more about what you can do with MyChart, go to [email or web address].    Your next appointment:   2 month(s)  The format for your next appointment:   In Person  Provider:   You may see Kathlyn Sacramento, MD or one of the following Advanced Practice Providers on your designated Care Team:    Murray Hodgkins, NP  Christell Faith, PA-C  Marrianne Mood, PA-C  Cadence Kathlen Mody, [place]  Other Instructions    Aortic Valve Stenosis  Aortic valve stenosis is a narrowing of the aortic valve in the heart. The aortic valve opens and closes to regulate blood flow between the left side of the heart (left ventricle) and the artery that leads away from the heart (aorta). When the aortic valve becomes narrow, it is difficult for the heart to pump blood out to the body,  which causes the heart to work harder. The extra work can weaken the heart muscle over time. Aortic valve stenosis can range from mild to severe. If it is not treated, it can become more severe over time and lead to heart failure. What are the causes? This condition may be caused by:  Buildup of calcium around and on the aortic valve. This can occur with aging. This is the most common cause of aortic valve stenosis.  A heart problem that developed in the womb (birth defect).  Rheumatic fever.  Radiation to the chest. What increases the risk? You may be more likely to develop this condition if:  You are older than age 56.  You were born with an abnormal bicuspid valve. What are the signs or symptoms? You may not have any symptoms until your condition becomes severe. It may take 10-20 years for mild or moderate aortic valve stenosis to become severe. Symptoms may include:  Shortness of breath. This may get worse during physical activity.  Feeling unusually weak and tired (fatigue).  Extreme discomfort in the chest, neck, or arm during physical activity (angina).  A heartbeat that is irregular or faster than normal (palpitations).  Dizziness or fainting. This may happen when you get physically tired or after you take certain heart medicines, such as nitroglycerin. How is this diagnosed? This condition may be diagnosed with:  A physical exam.  Echocardiogram. This is a type of imaging test that uses sound waves (ultrasound) to make images of your heart. There are two kinds of this test  that may be used. ? Transthoracic echocardiogram (TTE). For this type, a wand-like tool (transducer) is moved over your chest to create ultrasound images that are recorded by a computer. ? Transesophageal echocardiogram (TEE). For this type, a flexible tube (probe) is inserted down the part of the body that moves food from your mouth to your stomach (esophagus). The heart and the esophagus are close to  each other. Your health care provider will use the probe to take clear, detailed pictures of the heart.  Cardiac catheterization. For this procedure, a small, thin tube (catheter) is passed through a large vein in your neck, groin, or arm. The catheter is used to get information about arteries, structures, blood pressure, and oxygen levels in your heart.  Stress tests. These are tests that evaluate the blood supply to your heart and your heart's response to exercise. You may work with a health care provider who specializes in the heart (cardiologist) for diagnosis and treatment. How is this treated? Treatment depends on how severe your condition is and what your symptoms are. You will need to have your heart checked regularly to make sure that your condition is not getting worse or causing serious problems. Treatment may also include:  Surgery to replace your aortic valve. This is the most common treatment for aortic valve stenosis, and it is the only treatment to cure the condition. Several types of surgeries are available. The surgery may be done: ? Through a large incision over your heart (open-heart surgery). ? Through small incisions, using a flexible tube called a catheter (transcatheter aortic valve replacement, TAVR).  Medicines that help to keep your heart rate regular.  Medicines that thin your blood (anticoagulants) to prevent blood clots.  Antibiotic medicines to help prevent infection. If your condition is mild, you may only need regular follow-up visits for monitoring. Follow these instructions at home: Lifestyle  Limit alcohol intake to no more than 1 drink a day for nonpregnant women and 2 drinks a day for men. One drink equals 12 oz of beer, 5 oz of wine, or 1 oz of hard liquor.  Do not use any products that contain nicotine or tobacco, such as cigarettes and e-cigarettes. If you need help quitting, ask your health care provider.  Work with your health care provider to  manage your blood pressure and cholesterol.  Maintain a healthy weight. Eating and drinking   Eat a heart-healthy diet that includes plenty of fresh fruits and vegetables, whole grains, lean protein, and low-fat or nonfat dairy.  Limit how much caffeine you drink. Caffeine can affect your heart's rate and rhythm.  Avoid foods that are: ? High in salt (sodium), saturated fat, or sugar. ? Canned or highly processed. ? Fried.  Follow instructions from your health care provider about any other eating or drinking restrictions. Activity  Exercise regularly and return to your normal activities as told by your health care provider. Ask your health care provider what amount and type of physical activity is safe for you. ? If your aortic valve stenosis is mild, you may only need to avoid very intense physical activity, such as heavy weight lifting. ? The more severe your aortic valve stenosis is, the more activities you may need to avoid. If you are taking blood thinners:  Before you take any medicines that contain aspirin or NSAIDs, talk with your health care provider. These medicines increase your risk for dangerous bleeding.  Take your medicine exactly as told, at the same time every day.  Avoid activities that could cause injury or bruising, and follow instructions about how to prevent falls.  Wear a medical alert bracelet or carry a card that lists what medicines you take. General instructions  Take over-the-counter and prescription medicines only as told by your health care provider.  If you were prescribed an antibiotic, take it as told by your health care provider. Do not stop taking the antibiotic even if you start to feel better.  If you are a woman and you plan to become pregnant, talk with your health care provider before you become pregnant.  Before you have any type of medical or dental procedure or surgery, tell all health care providers that you have aortic valve stenosis.  This may affect the treatment that you receive.  Keep all follow-up visits as told by your health care provider. This is important. Contact a health care provider if:  You have a fever. Get help right away if:  You develop any of the following symptoms: ? Chest pain. ? Chest tightness. ? Shortness of breath. ? Trouble breathing.  You feel light-headed.  You feel like you might faint.  Your heartbeat is irregular or faster than normal. These symptoms may represent a serious problem that is an emergency. Do not wait to see if the symptoms will go away. Get medical help right away. Call your local emergency services (911 in the [place] Do not drive yourself to the hospital. Summary  Aortic valve stenosis is a narrowing of the aortic valve in the heart. The aortic valve opens and closes to regulate blood flow between the left side of the heart (left ventricle) and the artery that leads away from the heart (aorta).  Aortic valve stenosis can range from mild to severe. If it is not treated, it can become more severe over time and lead to heart failure.  Treatment depends on how severe your condition is and what your symptoms are. You will need to have your heart checked regularly to make sure that your condition is not getting worse or causing serious problems.  Exercise regularly and return to your normal activities as told by your health care provider. Ask your health care provider what amount and type of physical activity is safe for you. This information is not intended to replace advice given to you by your health care provider. Make sure you discuss any questions you have with your health care provider. Document Revised: 06/26/2017 Document Reviewed: 04/16/2017 Elsevier Patient Education  2020 [employer].

## 2020-05-31 ENCOUNTER — Encounter: Payer: Self-pay | Admitting: Family Medicine

## 2020-05-31 ENCOUNTER — Encounter: Payer: Self-pay | Admitting: Family

## 2020-05-31 DIAGNOSIS — G4700 Insomnia, unspecified: Secondary | ICD-10-CM

## 2020-06-04 NOTE — Telephone Encounter (Signed)
Pt c/o BP issue: STAT if pt c/o blurred vision, one-sided weakness or slurred speech  1. What are your last 5 BP readings? This weekend - 173/95 has been as high as 203/103  2. Are you having any other symptoms (ex. Dizziness, headache, blurred vision, passed out)? dizziness  3. What is your BP issue? Patient's BP has been high all weekend.  Patient would prefer to be seen today but nothing available at this time - scheduled with Vella Raring tomorrow at 10:00am.  Please call to discuss.

## 2020-06-05 ENCOUNTER — Other Ambulatory Visit: Payer: Self-pay

## 2020-06-05 ENCOUNTER — Observation Stay
Admission: EM | Admit: 2020-06-05 | Discharge: 2020-06-06 | Disposition: A | Discharge status: Home / Self Care | Payer: Medicare Other | Attending: Internal Medicine | Admitting: Internal Medicine

## 2020-06-05 ENCOUNTER — Emergency Department: Payer: Medicare Other

## 2020-06-05 ENCOUNTER — Ambulatory Visit: Payer: Medicare Other | Admitting: Family

## 2020-06-05 ENCOUNTER — Encounter: Payer: Self-pay | Admitting: Emergency Medicine

## 2020-06-05 ENCOUNTER — Encounter: Payer: Self-pay | Admitting: Family

## 2020-06-05 DIAGNOSIS — Z7982 Long term (current) use of aspirin: Secondary | ICD-10-CM | POA: Diagnosis not present

## 2020-06-05 DIAGNOSIS — I25119 Atherosclerotic heart disease of native coronary artery with unspecified angina pectoris: Secondary | ICD-10-CM | POA: Insufficient documentation

## 2020-06-05 DIAGNOSIS — F32A Depression, unspecified: Secondary | ICD-10-CM | POA: Diagnosis present

## 2020-06-05 DIAGNOSIS — R531 Weakness: Secondary | ICD-10-CM | POA: Diagnosis not present

## 2020-06-05 DIAGNOSIS — Z20822 Contact with and (suspected) exposure to covid-19: Secondary | ICD-10-CM | POA: Diagnosis not present

## 2020-06-05 DIAGNOSIS — J45909 Unspecified asthma, uncomplicated: Secondary | ICD-10-CM | POA: Insufficient documentation

## 2020-06-05 DIAGNOSIS — J449 Chronic obstructive pulmonary disease, unspecified: Secondary | ICD-10-CM | POA: Insufficient documentation

## 2020-06-05 DIAGNOSIS — R200 Anesthesia of skin: Secondary | ICD-10-CM

## 2020-06-05 DIAGNOSIS — G459 Transient cerebral ischemic attack, unspecified: Secondary | ICD-10-CM | POA: Diagnosis not present

## 2020-06-05 DIAGNOSIS — R457 State of emotional shock and stress, unspecified: Secondary | ICD-10-CM | POA: Diagnosis not present

## 2020-06-05 DIAGNOSIS — I2510 Atherosclerotic heart disease of native coronary artery without angina pectoris: Secondary | ICD-10-CM | POA: Diagnosis present

## 2020-06-05 DIAGNOSIS — R0602 Shortness of breath: Secondary | ICD-10-CM | POA: Diagnosis not present

## 2020-06-05 DIAGNOSIS — I350 Nonrheumatic aortic (valve) stenosis: Secondary | ICD-10-CM

## 2020-06-05 DIAGNOSIS — I10 Essential (primary) hypertension: Secondary | ICD-10-CM

## 2020-06-05 DIAGNOSIS — F419 Anxiety disorder, unspecified: Secondary | ICD-10-CM | POA: Diagnosis present

## 2020-06-05 DIAGNOSIS — I119 Hypertensive heart disease without heart failure: Secondary | ICD-10-CM | POA: Insufficient documentation

## 2020-06-05 DIAGNOSIS — M1990 Unspecified osteoarthritis, unspecified site: Secondary | ICD-10-CM | POA: Diagnosis present

## 2020-06-05 DIAGNOSIS — R55 Syncope and collapse: Secondary | ICD-10-CM | POA: Diagnosis not present

## 2020-06-05 DIAGNOSIS — R202 Paresthesia of skin: Secondary | ICD-10-CM | POA: Diagnosis not present

## 2020-06-05 DIAGNOSIS — E785 Hyperlipidemia, unspecified: Secondary | ICD-10-CM

## 2020-06-05 DIAGNOSIS — Z8673 Personal history of transient ischemic attack (TIA), and cerebral infarction without residual deficits: Secondary | ICD-10-CM

## 2020-06-05 DIAGNOSIS — Z79899 Other long term (current) drug therapy: Secondary | ICD-10-CM | POA: Insufficient documentation

## 2020-06-05 DIAGNOSIS — I63411 Cerebral infarction due to embolism of right middle cerebral artery: Secondary | ICD-10-CM

## 2020-06-05 LAB — BASIC METABOLIC PANEL
Anion gap: 14 (ref 5–15)
BUN: 14 mg/dL (ref 8–23)
CO2: 23 mmol/L (ref 22–32)
Calcium: 8.9 mg/dL (ref 8.9–10.3)
Chloride: 97 mmol/L — ABNORMAL LOW (ref 98–111)
Creatinine, Ser: 0.76 mg/dL (ref 0.44–1.00)
GFR, Estimated: >60 mL/min (ref >60)
Glucose, Bld: 133 mg/dL — ABNORMAL HIGH (ref 70–99)
Potassium: 3.4 mmol/L — ABNORMAL LOW (ref 3.5–5.1)
Sodium: 134 mmol/L — ABNORMAL LOW (ref 135–145)

## 2020-06-05 LAB — CBC
HCT: 36.2 % (ref 36.0–46.0)
Hemoglobin: 12.4 g/dL (ref 12.0–15.0)
MCH: 31.2 pg (ref 26.0–34.0)
MCHC: 34.3 g/dL (ref 30.0–36.0)
MCV: 91.2 fL (ref 80.0–100.0)
Platelets: 222 10*3/uL (ref 150–400)
RBC: 3.97 MIL/uL (ref 3.87–5.11)
RDW: 11.9 % (ref 11.5–15.5)
WBC: 10.9 10*3/uL — ABNORMAL HIGH (ref 4.0–10.5)
nRBC: 0 % (ref 0.0–0.2)

## 2020-06-05 LAB — RESPIRATORY PANEL BY RT PCR (FLU A&B, COVID)
Influenza A by PCR: NEGATIVE
Influenza B by PCR: NEGATIVE
SARS Coronavirus 2 by RT PCR: NEGATIVE

## 2020-06-05 LAB — TROPONIN I (HIGH SENSITIVITY)
Troponin I (High Sensitivity): 25 ng/L — ABNORMAL HIGH (ref <18)
Troponin I (High Sensitivity): 20 ng/L — ABNORMAL HIGH (ref <18)

## 2020-06-05 MED ORDER — ASPIRIN 81 MG PO CHEW
324.0000 mg | CHEWABLE_TABLET | Freq: Once | ORAL | Status: AC
  Administered 2020-06-05: 324 mg via ORAL
  Filled 2020-06-05: qty 4

## 2020-06-05 NOTE — H&P (Addendum)
History and Physical   Amy Morton KVQ:[id number] DOB: [birth year] DOA: 06/05/2020  PCP: [name], MD  Outpatient Specialists: [facility] with Dr. Fletcher Anon Patient coming from: home  I have personally briefly reviewed patient's old medical records in [place].  Chief Concern: numbness  HPI: Amy Morton is a 85 y.o. female with medical history significant for aortic stenosis pending evaluation for TAVR, hypertension, history of CAD status post PCI in 2016, anxiety/depression hyperlipidemia presented from nail salon for chief concerns of left arm numbness. She denies having symptoms like this before.  She reports that she was not holding her arm immobile for long period of time.  She reports that she was sitting in chair at salon waiting for her turn when she experienced left arm numbness. This has never happened before. She reports that it lasted for a few minutes and resolved prior to EMS arrival.   She denies associated diaphoresis, abnormal taste in her mouth, changes to baseline weakness, fever, chills, cough, shortness of breath, chest pain, dysphagia, nausea, vomiting, headache, vision changes, abdominal pain, dysuria, hematuria, diarhea, back pain, palpitations.  Prior to going to the ED salon for ingrown toenail removal, she had had a banana and half a coffee today for breakfast.  Social history: lives by herself, retired from working in [employer] educational needs teacher. Denies tobacco, etoh, recreational.   ED Course: discussed with ED provider requesting admission for TIA.  Review of Systems: As per HPI otherwise 10 point review of systems negative.   Assessment/Plan  Principal Problem:   Numbness Active Problems:   Anxiety and depression   Nonrheumatic aortic valve stenosis   CAD (coronary artery disease)   Arthritis   Hypertension   Numbness and tingling of her left neck and left upper extremity lasting several minutes -Suspected TIA -Last echo done on 02/2020  therefore did not reorder -CT was negative for acuity -Ultrasound of the bilateral carotids ordered -MRI and MRA ordered -A.m. team to consult neurology if indicated -Checking B12, vitamin D, TSH -Checking lipid panel and A1c -Aspiration and fall precautions -Admit for observation -Primary team to consult neurology in the a.m.  Elevated troponin with negative delta, no EKG changes, and patient denies chest pain therefore I have low clinical suspicion for ACS at this time -We will consult cardiology if anything changes  Known moderate aortic stenosis-she is currently being evaluated for TAVR  Hypertension-allowing for permissive hypertension to SBP < 180 -Holding metoprolol,, nitroglycerin sublingual -Labetalol 5 mg every 4 hours as needed for SBP greater than 180  Hyperlipidemia-atorvastatin 80 mg nightly  Depression versus/anxiety-Per patient she was told not to take BuSpar 5 mg anymore  History of CAD status post PCI to the left circumflex in 2016 with known occluded distal right coronary artery -Endorses compliance with medication -resumed high intensity atorvastatin -Resumed home Plavix and aspirin  Hypokalemia-mild, potassium chloride 20 mEq daily for 2 doses  History of asthma-not in exacerbation at this time, albuterol as needed resumed  Depression-mirtazapine 30 mg nightly resumed  GERD Protonix 40 mg daily  DVT prophylaxis: SCDs Code Status: Full code Diet: N.p.o. Family Communication: No Disposition Plan: Pending clinical course Consults called: None at this time pending work-up Admission status: Observation with telemetry  Past Medical History:  Diagnosis Date  . Abnormal mammogram 2007   Recommend close follow up  . Anemia    Iron, b12, SPEP, UPEP normal, 12/2012  . Anxiety   . Aortic stenosis    Severe by [facility] 05/2020  .  aortic stenosis   . Arthritis   . Asthma   . CAD (coronary artery disease)    s/p angioplasty 1996  . Chronic cough   .  Depression   . Diverticulosis   . Dyspnea   . Elevated LFTs 2010   s/p ultrasound w/ possible fatty liver; GI consult. Resolution 2011  . Gastric ulcer   . GERD (gastroesophageal reflux disease)   . History of colonic polyps    Dr. Vira Agar  . HOH (hard of hearing)   . Hyperlipidemia   . Hypertension   . Peritoneal carcinomatosis [facility]) 2001   Dr. Oliva Bustard & Dr. Claiborne Rigg s/p chemo  . Shingles 2008   T-9 distribution  . Valvular heart disease    Mild to Moderate MR, AS  . Vitamin D deficiency   . Wheezing    Past Surgical History:  Procedure Laterality Date  . ABDOMINAL HYSTERECTOMY  2001  . APPENDECTOMY    . CARDIAC CATHETERIZATION  2007   50% mid LAD stenosis, 70% proximal RCA with 100% distal RCA stenosis with collaterals, EF 65%  . CARDIAC CATHETERIZATION N/A 04/27/2015   Procedure: Left Heart Cath and Coronary Angiography;  Surgeon: Jettie Booze, MD;  Location: [place] [facility];  Service: Cardiovascular;  Laterality: N/A;  . CARDIAC CATHETERIZATION N/A 04/27/2015   Procedure: Coronary Stent Intervention;  Surgeon: Jettie Booze, MD;  Location: [place] [facility];  Service: Cardiovascular;  Laterality: N/A;  . CATARACT EXTRACTION W/PHACO Left 09/25/2016   Procedure: CATARACT EXTRACTION PHACO AND INTRAOCULAR LENS PLACEMENT (IOC);  Surgeon: Eulogio Bear, MD;  Location: [facility];  Service: Ophthalmology;  Laterality: Left;  [place] 46.7 AP% 8.1 CDE 3.80 Fluid pack lot # [id number]  . CATARACT EXTRACTION W/PHACO Right 11/06/2016   Procedure: CATARACT EXTRACTION PHACO AND INTRAOCULAR LENS PLACEMENT ([place]);  Surgeon: Eulogio Bear, MD;  Location: [facility];  Service: Ophthalmology;  Laterality: Right;  [place] 49.5 AP8.7 CDE4.28 LOT # [id number]  . CHOLECYSTECTOMY  1987  . COLECTOMY  2001  . CORONARY ANGIOPLASTY  1996  . CORONARY ANGIOPLASTY    . LAPAROSCOPIC BILATERAL SALPINGO OOPHERECTOMY  1988   Dr. Laverta Baltimore  . LEFT HEART CATH AND CORONARY ANGIOGRAPHY N/A 01/25/2018    Procedure: LEFT HEART CATH AND CORONARY ANGIOGRAPHY;  Surgeon: Wellington Hampshire, MD;  Location: [place] [facility];  Service: Cardiovascular;  Laterality: N/A;  . RIGHT/LEFT HEART CATH AND CORONARY ANGIOGRAPHY N/A 05/16/2020   Procedure: RIGHT/LEFT HEART CATH AND CORONARY ANGIOGRAPHY;  Surgeon: Wellington Hampshire, MD;  Location: [place] [facility];  Service: Cardiovascular;  Laterality: N/A;  . TONSILLECTOMY  1950   Social History:  reports that she has never smoked. She has never used smokeless tobacco. She reports that she does not drink alcohol and does not use drugs.  Allergies  Allergen Reactions  . Abilify [Aripiprazole] Other (See Comments)    Burning in Chest Elevated b/p Insomnia  . Celexa [Citalopram]     Caused low sodium level.    Family History  Problem Relation Age of Onset  . Hypertension Mother   . Stroke Mother 65       Cerebral Hemorrhage  . Cancer Father        bone cancer  . Diabetes Sister   . Diabetes Brother   . Heart disease Brother   . Cancer Daughter 18       Ovarian cancer  . Colon cancer Neg Hx   . Colon polyps Neg Hx  Family history: Family history reviewed and cerebral hemorrhage in mother at age 8  Prior to Admission medications   Medication Sig Start Date End Date Taking? Authorizing Provider  acetaminophen (TYLENOL) 500 MG tablet Take 1,000 mg by mouth in the morning and at bedtime.    [provider]  albuterol (VENTOLIN HFA) 108 (90 Base) MCG/ACT inhaler Inhale 2 puffs into the lungs every 4 (four) hours as needed for wheezing or shortness of breath. 08/19/19   [name], MD  aspirin 81 MG tablet Take 81 mg by mouth daily.    [provider]  atorvastatin (LIPITOR) 80 MG tablet TAKE 1/2 TABLET(40 MG) BY MOUTH DAILY AT 6 PM Patient taking differently: Take 40 mg by mouth daily.  05/24/20   Wellington Hampshire, MD  busPIRone (BUSPAR) 5 MG tablet Take 1 tablet (5 mg total) by mouth 3 (three) times daily. 04/24/20    [name], MD  carboxymethylcellul-glycerin (REFRESH RELIEVA) 0.5-0.9 % ophthalmic solution Place 1 drop into both eyes daily as needed for dry eyes.    [provider]  Cholecalciferol 5000 units TABS Take 5,000 Units by mouth daily.    [provider]  clopidogrel (PLAVIX) 75 MG tablet TAKE 1 TABLET BY MOUTH DAILY WITH BREAKFAST Patient taking differently: Take 75 mg by mouth daily.  03/12/20   Wellington Hampshire, MD  isosorbide mononitrate (IMDUR) 30 MG 24 hr tablet TAKE 1 TABLET(30 MG) BY MOUTH DAILY Patient taking differently: Take 30 mg by mouth daily.  12/12/19   Wellington Hampshire, MD  loperamide (IMODIUM A-D) 2 MG tablet Take 2 mg by mouth 4 (four) times daily as needed for diarrhea or loose stools.    [provider]  metoprolol tartrate (LOPRESSOR) 50 MG tablet TAKE 1 TABLET(50 MG) BY MOUTH TWICE DAILY Patient taking differently: Take 50 mg by mouth 2 (two) times daily.  12/12/19   Wellington Hampshire, MD  mirtazapine (REMERON) 30 MG tablet TAKE 1 TABLET BY MOUTH EVERY NIGHT AT BEDTIME Patient taking differently: Take 30 mg by mouth at bedtime.  03/27/20   [name], MD  Multiple Vitamin (MULTIVITAMIN) capsule Take 1 capsule by mouth daily.    [provider]  nitroGLYCERIN (NITROSTAT) 0.4 MG SL tablet PLACE 1 TABLET UNDER THE TONGUE EVERY 5 MINUTES AS NEEDED FOR CHEST PAIN, 3 DOSES MAX Patient taking differently: Place 0.4 mg under the tongue every 5 (five) minutes as needed for chest pain. EVERY 5 MINUTES AS NEEDED FOR CHEST PAIN, 3 DOSES MAX 11/24/19   Wellington Hampshire, MD  Nutritional Supplements (OSTEO ADVANCE) TABS Take 1 tablet by mouth daily.     [provider]  omega-3 acid ethyl esters (LOVAZA) 1 g capsule TAKE 2 CAPSULES BY MOUTH TWICE DAILY Patient taking differently: Take 2 g by mouth 2 (two) times daily.  12/12/19   Einar Pheasant, MD  pantoprazole (PROTONIX) 40 MG tablet TAKE 1 TABLET(40 MG) BY MOUTH DAILY Patient  taking differently: Take 40 mg by mouth daily.  12/12/19   Wellington Hampshire, MD  Ubiquinol (QUNOL COQ10/UBIQUINOL/MEGA) 100 MG CAPS Take 100 mg by mouth every morning.    [provider]  vitamin B-12 (CYANOCOBALAMIN) 250 MCG tablet Take 250 mcg by mouth daily.    [provider]   Physical Exam: Vitals:   06/05/20 2215 06/05/20 2230 06/05/20 2245 06/05/20 2345  BP:  (!) 148/79  (!) 158/75  Pulse: 86 79 77 75  Resp: 15 20  18 17  Temp:    97.7 F (36.5 C)  TempSrc:    Oral  SpO2: 96% 95% 95% 94%  Weight:      Height:       Constitutional: appears age-appropriate, NAD, calm, comfortable Eyes: PERRL, lids and conjunctivae normal ENMT: Mucous membranes are moist. Posterior pharynx clear of any exudate or lesions. Age-appropriate dentition. Hearing appropriate Neck: normal, supple, no masses, no thyromegaly Respiratory: clear to auscultation bilaterally, no wheezing, no crackles. Normal respiratory effort. No accessory muscle use.  Cardiovascular: Regular rate and rhythm, no murmurs / rubs / gallops. No extremity edema. 2+ pedal pulses. No carotid bruits.  Abdomen: no tenderness, no masses palpated, no hepatosplenomegaly. Bowel sounds positive.  Musculoskeletal: no clubbing / cyanosis. No joint deformity upper and lower extremities. Good ROM, no contractures, no atrophy. Normal muscle tone.  Skin: no rashes, lesions, ulcers. No induration Neurologic: CN 2-12 grossly intact. Sensation intact. Strength 5/5 in all 4.  Psychiatric: Normal judgment and insight. Alert and oriented x 3. Normal mood.   EKG: Independently reviewed, showing normal sinus rhythm with a rate of 83, LVH  Chest x-ray on Admission: Personally reviewed and I agree with radiologist reading as below.  CT Head Wo Contrast  Result Date: 06/05/2020 CLINICAL DATA:  Left-sided numbness and tingling intermittently EXAM: CT HEAD WITHOUT CONTRAST TECHNIQUE: Contiguous axial images were obtained from the base of  the skull through the vertex without intravenous contrast. COMPARISON:  Dec 16, 2019 FINDINGS: Brain: There is age related volume loss. There is no intracranial mass, hemorrhage, extra-axial fluid collection, or midline shift. There is mild small vessel disease in the centra semiovale bilaterally. Elsewhere brain parenchyma appears unremarkable. No evident acute infarct. Vascular: No hyperdense vessel. There is calcification in each distal vertebral artery and carotid siphon region. Skull: The bony calvarium appears intact. Sinuses/Orbits: There is opacification in seen oil sinus regions bilaterally as well as in several ethmoid air cells. Visualized orbits appear symmetric bilaterally. Other: Mastoid air cells are clear. IMPRESSION: Age related volume loss with mild periventricular small vessel disease. No acute infarct. No mass or hemorrhage. There are foci of arterial vascular calcification. There are areas of paranasal sinus disease, most notably in the sphenoid sinus regions. Electronically Signed   By: Lowella Grip III M.D.   On: 06/05/2020 17:12   DG Chest Portable 1 View  Result Date: 06/05/2020 CLINICAL DATA:  Shortness of breath. EXAM: PORTABLE CHEST 1 VIEW COMPARISON:  March 10, 2020. FINDINGS: The heart size and mediastinal contours are within normal limits. Minimal bibasilar subsegmental atelectasis or scarring is noted. No pneumothorax or pleural effusion is noted. The visualized skeletal structures are unremarkable. IMPRESSION: Minimal bibasilar subsegmental atelectasis or scarring. Aortic Atherosclerosis (ICD10-I70.0). Electronically Signed   By: Marijo Conception M.D.   On: 06/05/2020 17:26   Labs on Admission: I have personally reviewed following labs  CBC: Recent Labs  Lab 06/05/20 1413  WBC 10.9*  HGB 12.4  HCT 36.2  MCV 91.2  PLT 846   Basic Metabolic Panel: Recent Labs  Lab 06/05/20 1413  NA 134*  K 3.4*  CL 97*  CO2 23  GLUCOSE 133*  BUN 14  CREATININE 0.76    CALCIUM 8.9   Urine analysis:    Component Value Date/Time   COLORURINE COLORLESS (A) 12/16/2019 2113   APPEARANCEUR CLEAR (A) 12/16/2019 2113   APPEARANCEUR Hazy 11/17/2014 2031   LABSPEC 1.003 (L) 12/16/2019 2113   LABSPEC 1.014 11/17/2014 2031   PHURINE 8.0  12/16/2019 2113   GLUCOSEU NEGATIVE 12/16/2019 2113   GLUCOSEU Negative 11/17/2014 2031   HGBUR NEGATIVE 12/16/2019 2113   BILIRUBINUR NEGATIVE 12/16/2019 2113   BILIRUBINUR Negative 11/17/2014 2031   KETONESUR NEGATIVE 12/16/2019 2113   PROTEINUR NEGATIVE 12/16/2019 2113   UROBILINOGEN negative 11/17/2014 1206   NITRITE NEGATIVE 12/16/2019 2113   LEUKOCYTESUR NEGATIVE 12/16/2019 2113   LEUKOCYTESUR 1+ 11/17/2014 2031   Shigeru Lampert N Macio Kissoon D.O. [facility]  If 12AM-7AM, please contact overnight-coverage provider If 7AM-7PM, please contact day coverage provider [email or web address]  06/06/2020, 12:54 AM

## 2020-06-05 NOTE — ED Provider Notes (Signed)
[facility] Emergency Department Provider Note  ____________________________________________   First MD Initiated Contact with Patient 06/05/20 1559     (approximate)  I have reviewed the triage vital signs and the nursing notes.   HISTORY  Chief Complaint Near Syncope    HPI Amy Morton is a 85 y.o. female here with near syncopal episode. Pt was getting an ingrown toenail worked on today when she experienced acute onset of left facial numbness, left arm and leg numbness. She felt lightheaded and like she was going to pass out. No vision changes. No CP, palpitations, or SOB. Symptoms persisted for several minutes, have now resolved. She has a h/o AS and has had episodes of lightheadedness, but no episodes similar to this.  Denies any headache.  Denies any vision changes.  No other numbness or weakness.  No known history of CVA or TIA.  No recent medication changes.  No other acute complaints.   Past Medical History:  Diagnosis Date  . Abnormal mammogram 2007   Recommend close follow up  . Anemia    Iron, b12, SPEP, UPEP normal, 12/2012  . Anxiety   . Aortic stenosis    Severe by [facility] 05/2020  . aortic stenosis   . Arthritis   . Asthma   . CAD (coronary artery disease)    s/p angioplasty 1996  . Chronic cough   . Depression   . Diverticulosis   . Dyspnea   . Elevated LFTs 2010   s/p ultrasound w/ possible fatty liver; GI consult. Resolution 2011  . Gastric ulcer   . GERD (gastroesophageal reflux disease)   . History of colonic polyps    Dr. Vira Agar  . HOH (hard of hearing)   . Hyperlipidemia   . Hypertension   . Peritoneal carcinomatosis [facility]) 2001   Dr. Oliva Bustard & Dr. Claiborne Rigg s/p chemo  . Shingles 2008   T-9 distribution  . Valvular heart disease    Mild to Moderate MR, AS  . Vitamin D deficiency   . Wheezing     Patient Active Problem List   Diagnosis Date Noted  . Numbness 06/05/2020  . Hot flashes 02/10/2020  .  Asthma without status asthmaticus 12/15/2019  . Hypertension 12/15/2019  . Prediabetes 02/11/2019  . Memory difficulty 02/08/2018  . Angina pectoris ([place]) 01/22/2018  . Non-rheumatic mitral regurgitation 02/12/2017  . Orthostasis 10/20/2016  . Arthritis 05/22/2016  . COPD (chronic obstructive pulmonary disease) ([place]) 11/21/2015  . Hyponatremia 09/30/2015  . CAD (coronary artery disease) 04/27/2015  . Complex tear of medial meniscus of right knee as current injury 07/26/2014  . Primary osteoarthritis of right knee 07/26/2014  . Anemia 05/12/2014  . Nonrheumatic aortic valve stenosis 04/18/2014  . History of peritoneal carcinoma 04/17/2014  . Chronic diarrhea 04/17/2014  . Dyslipidemia, goal LDL below 70 04/17/2014  . Anxiety and depression 11/13/2013  . Vitamin D deficiency 11/13/2013    Past Surgical History:  Procedure Laterality Date  . ABDOMINAL HYSTERECTOMY  2001  . APPENDECTOMY    . CARDIAC CATHETERIZATION  2007   50% mid LAD stenosis, 70% proximal RCA with 100% distal RCA stenosis with collaterals, EF 65%  . CARDIAC CATHETERIZATION N/A 04/27/2015   Procedure: Left Heart Cath and Coronary Angiography;  Surgeon: Jettie Booze, MD;  Location: [place] CV LAB;  Service: Cardiovascular;  Laterality: N/A;  . CARDIAC CATHETERIZATION N/A 04/27/2015   Procedure: Coronary Stent Intervention;  Surgeon: Jettie Booze, MD;  Location: [place]  CV LAB;  Service: Cardiovascular;  Laterality: N/A;  . CATARACT EXTRACTION W/PHACO Left 09/25/2016   Procedure: CATARACT EXTRACTION PHACO AND INTRAOCULAR LENS PLACEMENT (IOC);  Surgeon: Eulogio Bear, MD;  Location: [facility] ORS;  Service: Ophthalmology;  Laterality: Left;  [place] 46.7 AP% 8.1 CDE 3.80 Fluid pack lot # [id number]  . CATARACT EXTRACTION W/PHACO Right 11/06/2016   Procedure: CATARACT EXTRACTION PHACO AND INTRAOCULAR LENS PLACEMENT ([place]);  Surgeon: Eulogio Bear, MD;  Location: [facility] ORS;  Service: Ophthalmology;   Laterality: Right;  [place] 49.5 AP8.7 CDE4.28 LOT # [id number]  . CHOLECYSTECTOMY  1987  . COLECTOMY  2001  . CORONARY ANGIOPLASTY  1996  . CORONARY ANGIOPLASTY    . LAPAROSCOPIC BILATERAL SALPINGO OOPHERECTOMY  1988   Dr. Laverta Baltimore  . LEFT HEART CATH AND CORONARY ANGIOGRAPHY N/A 01/25/2018   Procedure: LEFT HEART CATH AND CORONARY ANGIOGRAPHY;  Surgeon: Wellington Hampshire, MD;  Location: [place] CV LAB;  Service: Cardiovascular;  Laterality: N/A;  . RIGHT/LEFT HEART CATH AND CORONARY ANGIOGRAPHY N/A 05/16/2020   Procedure: RIGHT/LEFT HEART CATH AND CORONARY ANGIOGRAPHY;  Surgeon: Wellington Hampshire, MD;  Location: [place] CV LAB;  Service: Cardiovascular;  Laterality: N/A;  . TONSILLECTOMY  1950    Prior to Admission medications   Medication Sig Start Date End Date Taking? Authorizing Provider  acetaminophen (TYLENOL) 500 MG tablet Take 1,000 mg by mouth in the morning and at bedtime.    [provider]  albuterol (VENTOLIN HFA) 108 (90 Base) MCG/ACT inhaler Inhale 2 puffs into the lungs every 4 (four) hours as needed for wheezing or shortness of breath. 08/19/19   [name], MD  aspirin 81 MG tablet Take 81 mg by mouth daily.    [provider]  atorvastatin (LIPITOR) 80 MG tablet TAKE 1/2 TABLET(40 MG) BY MOUTH DAILY AT 6 PM Patient taking differently: Take 40 mg by mouth daily.  05/24/20   Wellington Hampshire, MD  busPIRone (BUSPAR) 5 MG tablet Take 1 tablet (5 mg total) by mouth 3 (three) times daily. 04/24/20   [name], MD  carboxymethylcellul-glycerin (REFRESH RELIEVA) 0.5-0.9 % ophthalmic solution Place 1 drop into both eyes daily as needed for dry eyes.    [provider]  Cholecalciferol 5000 units TABS Take 5,000 Units by mouth daily.    [provider]  clopidogrel (PLAVIX) 75 MG tablet TAKE 1 TABLET BY MOUTH DAILY WITH BREAKFAST Patient taking differently: Take 75 mg by mouth daily.  03/12/20   Wellington Hampshire, MD  isosorbide  mononitrate (IMDUR) 30 MG 24 hr tablet TAKE 1 TABLET(30 MG) BY MOUTH DAILY Patient taking differently: Take 30 mg by mouth daily.  12/12/19   Wellington Hampshire, MD  loperamide (IMODIUM A-D) 2 MG tablet Take 2 mg by mouth 4 (four) times daily as needed for diarrhea or loose stools.    [provider]  metoprolol tartrate (LOPRESSOR) 50 MG tablet TAKE 1 TABLET(50 MG) BY MOUTH TWICE DAILY Patient taking differently: Take 50 mg by mouth 2 (two) times daily.  12/12/19   Wellington Hampshire, MD  mirtazapine (REMERON) 30 MG tablet TAKE 1 TABLET BY MOUTH EVERY NIGHT AT BEDTIME Patient taking differently: Take 30 mg by mouth at bedtime.  03/27/20   [name], MD  Multiple Vitamin (MULTIVITAMIN) capsule Take 1 capsule by mouth daily.    [provider]  nitroGLYCERIN (NITROSTAT) 0.4 MG SL tablet PLACE 1 TABLET UNDER THE TONGUE EVERY 5 MINUTES AS NEEDED  FOR CHEST PAIN, 3 DOSES MAX Patient taking differently: Place 0.4 mg under the tongue every 5 (five) minutes as needed for chest pain. EVERY 5 MINUTES AS NEEDED FOR CHEST PAIN, 3 DOSES MAX 11/24/19   Wellington Hampshire, MD  Nutritional Supplements (OSTEO ADVANCE) TABS Take 1 tablet by mouth daily.     [provider]  omega-3 acid ethyl esters (LOVAZA) 1 g capsule TAKE 2 CAPSULES BY MOUTH TWICE DAILY Patient taking differently: Take 2 g by mouth 2 (two) times daily.  12/12/19   Einar Pheasant, MD  pantoprazole (PROTONIX) 40 MG tablet TAKE 1 TABLET(40 MG) BY MOUTH DAILY Patient taking differently: Take 40 mg by mouth daily.  12/12/19   Wellington Hampshire, MD  Ubiquinol (QUNOL COQ10/UBIQUINOL/MEGA) 100 MG CAPS Take 100 mg by mouth every morning.    [provider]  vitamin B-12 (CYANOCOBALAMIN) 250 MCG tablet Take 250 mcg by mouth daily.    [provider]    Allergies Abilify [aripiprazole] and Celexa [citalopram]  Family History  Problem Relation Age of Onset  . Hypertension Mother   . Stroke Mother 39        Cerebral Hemorrhage  . Cancer Father        bone cancer  . Diabetes Sister   . Diabetes Brother   . Heart disease Brother   . Cancer Daughter 42       Ovarian cancer  . Colon cancer Neg Hx   . Colon polyps Neg Hx     Social History Social History   Tobacco Use  . Smoking status: Never Smoker  . Smokeless tobacco: Never Used  Vaping Use  . Vaping Use: Never used  Substance Use Topics  . Alcohol use: No    Alcohol/week: 0.0 standard drinks  . Drug use: No    Review of Systems  Review of Systems  Constitutional: Positive for fatigue. Negative for fever.  HENT: Negative for congestion and sore throat.   Eyes: Negative for visual disturbance.  Respiratory: Negative for cough and shortness of breath.   Cardiovascular: Negative for chest pain.  Gastrointestinal: Negative for abdominal pain, diarrhea, nausea and vomiting.  Genitourinary: Negative for flank pain.  Musculoskeletal: Negative for back pain and neck pain.  Skin: Negative for rash and wound.  Neurological: Positive for weakness and numbness.  All other systems reviewed and are negative.    ____________________________________________  PHYSICAL EXAM:      VITAL SIGNS: ED Triage Vitals  Enc Vitals Group     BP 06/05/20 1413 139/86     Pulse Rate 06/05/20 1413 84     Resp 06/05/20 1413 18     Temp 06/05/20 1413 97.9 F (36.6 C)     Temp src --      SpO2 06/05/20 1413 95 %     Weight 06/05/20 1419 134 lb 14.7 oz (61.2 kg)     Height 06/05/20 1419 4\' 11"  (1.499 m)     Head Circumference --      Peak Flow --      Pain Score 06/05/20 1418 0     Pain Loc --      Pain Edu? --      Excl. in [place]? --      Physical Exam Vitals and nursing note reviewed.  Constitutional:      General: She is not in acute distress.    Appearance: She is well-developed.  HENT:     Head: Normocephalic and atraumatic.  Eyes:  Conjunctiva/sclera: Conjunctivae normal.  Cardiovascular:     Rate and Rhythm: Normal rate and  regular rhythm.     Heart sounds: Normal heart sounds. No murmur heard.  No friction rub.  Pulmonary:     Effort: Pulmonary effort is normal. No respiratory distress.     Breath sounds: Normal breath sounds. No wheezing or rales.  Abdominal:     General: There is no distension.     Palpations: Abdomen is soft.     Tenderness: There is no abdominal tenderness.  Musculoskeletal:     Cervical back: Neck supple.  Skin:    General: Skin is warm.     Capillary Refill: Capillary refill takes less than 2 seconds.     Findings: No rash.  Neurological:     Mental Status: She is alert and oriented to person, place, and time.     Motor: No abnormal muscle tone.     Comments: CNII-XII intact. Normal sensation to light touch. Normal strength and sensation.        ____________________________________________   LABS (all labs ordered are listed, but only abnormal results are displayed)  Labs Reviewed  BASIC METABOLIC PANEL - Abnormal; Notable for the following components:      Result Value   Sodium 134 (*)    Potassium 3.4 (*)    Chloride 97 (*)    Glucose, Bld 133 (*)    All other components within normal limits  CBC - Abnormal; Notable for the following components:   WBC 10.9 (*)    All other components within normal limits  TROPONIN I (HIGH SENSITIVITY) - Abnormal; Notable for the following components:   Troponin I (High Sensitivity) 25 (*)    All other components within normal limits  TROPONIN I (HIGH SENSITIVITY) - Abnormal; Notable for the following components:   Troponin I (High Sensitivity) 20 (*)    All other components within normal limits  RESPIRATORY PANEL BY RT PCR (FLU A&B, COVID)    ____________________________________________  EKG: Normal sinus rhythm, ventricular rate 83. PR 154, QRS 90, QTc 448. No acute ST elevation or depression. No EKG evidence of acute ischemia or infarct. ________________________________________  RADIOLOGY All imaging, including plain  films, CT scans, and ultrasounds, independently reviewed by me, and interpretations confirmed via formal radiology reads.  ED MD interpretation:   CT head: Age-related changes, no acute normality  Official radiology report(s): CT Head Wo Contrast  Result Date: 06/05/2020 CLINICAL DATA:  Left-sided numbness and tingling intermittently EXAM: CT HEAD WITHOUT CONTRAST TECHNIQUE: Contiguous axial images were obtained from the base of the skull through the vertex without intravenous contrast. COMPARISON:  Dec 16, 2019 FINDINGS: Brain: There is age related volume loss. There is no intracranial mass, hemorrhage, extra-axial fluid collection, or midline shift. There is mild small vessel disease in the centra semiovale bilaterally. Elsewhere brain parenchyma appears unremarkable. No evident acute infarct. Vascular: No hyperdense vessel. There is calcification in each distal vertebral artery and carotid siphon region. Skull: The bony calvarium appears intact. Sinuses/Orbits: There is opacification in seen oil sinus regions bilaterally as well as in several ethmoid air cells. Visualized orbits appear symmetric bilaterally. Other: Mastoid air cells are clear. IMPRESSION: Age related volume loss with mild periventricular small vessel disease. No acute infarct. No mass or hemorrhage. There are foci of arterial vascular calcification. There are areas of paranasal sinus disease, most notably in the sphenoid sinus regions. Electronically Signed   By: Lowella Grip III M.D.   On: 06/05/2020 17:12  DG Chest Portable 1 View  Result Date: 06/05/2020 CLINICAL DATA:  Shortness of breath. EXAM: PORTABLE CHEST 1 VIEW COMPARISON:  March 10, 2020. FINDINGS: The heart size and mediastinal contours are within normal limits. Minimal bibasilar subsegmental atelectasis or scarring is noted. No pneumothorax or pleural effusion is noted. The visualized skeletal structures are unremarkable. IMPRESSION: Minimal bibasilar subsegmental  atelectasis or scarring. Aortic Atherosclerosis (ICD10-I70.0). Electronically Signed   By: Marijo Conception M.D.   On: 06/05/2020 17:26    ____________________________________________  PROCEDURES   Procedure(s) performed (including Critical Care):  Procedures  ____________________________________________  INITIAL IMPRESSION / MDM / [place] / ED COURSE  As part of my medical decision making, I reviewed the following data within the [place] notes reviewed and incorporated, Old chart reviewed, Notes from prior ED visits, and [place] Controlled Substance Database       *JANESSA MICKLE was evaluated in Emergency Department on 06/05/2020 for the symptoms described in the history of present illness. She was evaluated in the context of the global COVID-19 pandemic, which necessitated consideration that the patient might be at risk for infection with the SARS-CoV-2 virus that causes COVID-19. Institutional protocols and algorithms that pertain to the evaluation of patients at risk for COVID-19 are in a state of rapid change based on information released by regulatory bodies including the CDC and federal and state organizations. These policies and algorithms were followed during the patient's care in the ED.  Some ED evaluations and interventions may be delayed as a result of limited staffing during the pandemic.*  Clinical Course as of Jun 05 1901  Tue Jun 05, 2020  1616 Troponin I (High Sensitivity)(!): 25 [HS]    Clinical Course User Index [HS] Sherrill Raring, [place]    Medical Decision Making: 85 year old female here with transient left-sided numbness. Concern for TIA. Differential includes transient syncope PE versus near syncope with hypoperfusion causing strokelike symptoms, in the setting of aortic stenosis. CT head reviewed, shows no acute abnormality. Her screening lab work is largely at baseline, though her troponin is elevated at 25. This seems  to be above her baseline. She has no chest pain and EKG shows no evidence of active occlusion, she has known coronary disease diffusely based on recent cath. Will admit for TIA work-up. ____________________________________________  FINAL CLINICAL IMPRESSION(S) / ED DIAGNOSES  Final diagnoses:  TIA (transient ischemic attack)  Near syncope     MEDICATIONS GIVEN DURING THIS VISIT:  Medications  aspirin chewable tablet 324 mg (324 mg Oral Given 06/05/20 1813)     ED Discharge Orders    None       Note:  This document was prepared using Dragon voice recognition software and may include unintentional dictation errors.   Duffy Bruce, MD 06/05/20 1902

## 2020-06-05 NOTE — ED Triage Notes (Signed)
Pt in via EMS from a nail salon. EMS reports pt with left sided arm and leg numbness and tingling intermittently. Pt recently diagnosed with aortic stenosis a week ago and now has severe episode of anxiety. 153/81, 95 %RA, 77HR, FSBS 123

## 2020-06-05 NOTE — ED Notes (Signed)
Attempted to call report - RN has not read the chart. Will call back shortly

## 2020-06-05 NOTE — ED Notes (Signed)
Pt moved to hospital bed.

## 2020-06-05 NOTE — ED Triage Notes (Signed)
Patient to ER via [facility] from salon for feeling warmth and tingling to body, particularly mouth and feeling like she may pass out. Patient has h/o aortic stenosis and is potentially going to have valve replacement soon. Family reports patient was getting pedicure at the time of onset and had ingrown toenail cut, was not sure if it was a response to that. Family also reports patient has h/o anxiety.

## 2020-06-06 ENCOUNTER — Encounter: Payer: Self-pay | Admitting: Internal Medicine

## 2020-06-06 ENCOUNTER — Telehealth: Payer: Self-pay | Admitting: Family Medicine

## 2020-06-06 ENCOUNTER — Observation Stay: Payer: Medicare Other

## 2020-06-06 ENCOUNTER — Telehealth: Payer: Self-pay | Admitting: Cardiovascular Disease

## 2020-06-06 ENCOUNTER — Ambulatory Visit (INDEPENDENT_AMBULATORY_CARE_PROVIDER_SITE_OTHER): Payer: Medicare Other

## 2020-06-06 ENCOUNTER — Telehealth: Payer: Self-pay | Admitting: Physician Assistant

## 2020-06-06 DIAGNOSIS — R55 Syncope and collapse: Secondary | ICD-10-CM | POA: Diagnosis not present

## 2020-06-06 DIAGNOSIS — I10 Essential (primary) hypertension: Secondary | ICD-10-CM | POA: Diagnosis not present

## 2020-06-06 DIAGNOSIS — I350 Nonrheumatic aortic (valve) stenosis: Secondary | ICD-10-CM | POA: Diagnosis not present

## 2020-06-06 DIAGNOSIS — I639 Cerebral infarction, unspecified: Secondary | ICD-10-CM | POA: Diagnosis not present

## 2020-06-06 DIAGNOSIS — I63411 Cerebral infarction due to embolism of right middle cerebral artery: Secondary | ICD-10-CM | POA: Diagnosis not present

## 2020-06-06 DIAGNOSIS — E785 Hyperlipidemia, unspecified: Secondary | ICD-10-CM

## 2020-06-06 DIAGNOSIS — G459 Transient cerebral ischemic attack, unspecified: Secondary | ICD-10-CM

## 2020-06-06 DIAGNOSIS — R200 Anesthesia of skin: Secondary | ICD-10-CM | POA: Diagnosis not present

## 2020-06-06 DIAGNOSIS — I6523 Occlusion and stenosis of bilateral carotid arteries: Secondary | ICD-10-CM | POA: Diagnosis not present

## 2020-06-06 DIAGNOSIS — Z8673 Personal history of transient ischemic attack (TIA), and cerebral infarction without residual deficits: Secondary | ICD-10-CM

## 2020-06-06 LAB — LIPID PANEL
Cholesterol: 95 mg/dL (ref 0–200)
HDL: 41 mg/dL (ref >40)
LDL Cholesterol: 40 mg/dL (ref 0–99)
Total CHOL/HDL Ratio: 2.3 RATIO
Triglycerides: 68 mg/dL (ref <150)
VLDL: 14 mg/dL (ref 0–40)

## 2020-06-06 LAB — VITAMIN B12: Vitamin B-12: 617 pg/mL (ref 180–914)

## 2020-06-06 LAB — HEMOGLOBIN A1C
Hgb A1c MFr Bld: 6 % — ABNORMAL HIGH (ref 4.8–5.6)
Mean Plasma Glucose: 125.5 mg/dL

## 2020-06-06 LAB — TSH: TSH: 2.509 u[IU]/mL (ref 0.350–4.500)

## 2020-06-06 MED ORDER — ATORVASTATIN CALCIUM 20 MG PO TABS: 40.0000 mg | ORAL_TABLET | Freq: Every day | ORAL | Status: DC

## 2020-06-06 MED ORDER — ASPIRIN EC 81 MG PO TBEC
81.0000 mg | DELAYED_RELEASE_TABLET | Freq: Every day | ORAL | Status: DC
  Administered 2020-06-06: 81 mg via ORAL
  Filled 2020-06-06: qty 1

## 2020-06-06 MED ORDER — ACETAMINOPHEN 650 MG RE SUPP: 650.0000 mg | RECTAL | Status: DC | PRN

## 2020-06-06 MED ORDER — ALBUTEROL SULFATE HFA 108 (90 BASE) MCG/ACT IN AERS
2.0000 | INHALATION_SPRAY | RESPIRATORY_TRACT | Status: DC | PRN
  Filled 2020-06-06: qty 6.7

## 2020-06-06 MED ORDER — LABETALOL HCL 5 MG/ML IV SOLN: 5.0000 mg | INTRAVENOUS | Status: DC | PRN

## 2020-06-06 MED ORDER — MIRTAZAPINE 15 MG PO TABS
30.0000 mg | ORAL_TABLET | Freq: Every day | ORAL | Status: DC
  Administered 2020-06-06: 30 mg via ORAL

## 2020-06-06 MED ORDER — CLOPIDOGREL BISULFATE 75 MG PO TABS
75.0000 mg | ORAL_TABLET | Freq: Every day | ORAL | Status: DC
  Administered 2020-06-06: 75 mg via ORAL
  Filled 2020-06-06: qty 1

## 2020-06-06 MED ORDER — NITROGLYCERIN 0.4 MG SL SUBL: 0.4000 mg | SUBLINGUAL_TABLET | SUBLINGUAL | Status: DC | PRN

## 2020-06-06 MED ORDER — CYANOCOBALAMIN 500 MCG PO TABS
250.0000 ug | ORAL_TABLET | Freq: Every day | ORAL | Status: DC
  Administered 2020-06-06: 250 ug via ORAL
  Filled 2020-06-06: qty 1

## 2020-06-06 MED ORDER — ISOSORBIDE MONONITRATE ER 30 MG PO TB24: 30.0000 mg | ORAL_TABLET | Freq: Every day | ORAL | Status: DC

## 2020-06-06 MED ORDER — ACETAMINOPHEN 325 MG PO TABS: 650.0000 mg | ORAL_TABLET | ORAL | Status: DC | PRN

## 2020-06-06 MED ORDER — SODIUM CHLORIDE 0.9 % IV SOLN: INTRAVENOUS | Status: DC

## 2020-06-06 MED ORDER — ACETAMINOPHEN 160 MG/5ML PO SOLN
650.0000 mg | ORAL | Status: DC | PRN
  Filled 2020-06-06: qty 20.3

## 2020-06-06 MED ORDER — ADULT MULTIVITAMIN W/MINERALS CH
1.0000 | ORAL_TABLET | Freq: Every day | ORAL | Status: DC
  Administered 2020-06-06: 1 via ORAL
  Filled 2020-06-06: qty 1

## 2020-06-06 MED ORDER — PANTOPRAZOLE SODIUM 40 MG PO TBEC
40.0000 mg | DELAYED_RELEASE_TABLET | Freq: Every day | ORAL | Status: DC
  Administered 2020-06-06: 40 mg via ORAL
  Filled 2020-06-06: qty 1

## 2020-06-06 MED ORDER — POTASSIUM CHLORIDE CRYS ER 20 MEQ PO TBCR
20.0000 meq | EXTENDED_RELEASE_TABLET | Freq: Every day | ORAL | Status: DC
  Administered 2020-06-06: 20 meq via ORAL
  Filled 2020-06-06: qty 1

## 2020-06-06 MED ORDER — STROKE: EARLY STAGES OF RECOVERY BOOK: Freq: Once | Status: AC

## 2020-06-06 MED ORDER — METOPROLOL TARTRATE 25 MG PO TABS
25.0000 mg | ORAL_TABLET | Freq: Two times a day (BID) | ORAL | 0 refills | Status: DC
Start: 2020-06-06 — End: 2020-07-06

## 2020-06-06 MED ORDER — METOPROLOL TARTRATE 50 MG PO TABS: 50.0000 mg | ORAL_TABLET | Freq: Two times a day (BID) | ORAL | Status: DC

## 2020-06-06 MED ORDER — LABETALOL HCL 5 MG/ML IV SOLN: 10.0000 mg | INTRAVENOUS | Status: DC | PRN

## 2020-06-06 NOTE — Evaluation (Signed)
Occupational Therapy Evaluation Patient Details Name: Amy Morton MRN: [id number] DOB: [birth year] Today's Date: 06/06/2020    History of Present Illness presented to ER secondary to acute onset of L UE numbness; admitted for TIA/CVA management.  MRI significant for small R parietal acute/subacute infarct   Clinical Impression   Amy Morton presented to ED yesterday, following a short episode of L UE numbness, which had resolved by the time EMS arrived. Pt lives alone in a senior community, in a handicapped-accessible home. 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 She reports she has never had a fall. She is widowed and has 2 deceased children. Her remaining child lives nearby and visits her daily. She also visits regularly with her sister. Upon evaluation, Amy Morton displays strength and ROM WFL, grossly symmetrical bilaterally, with no residue numbness in L UE. Reinforced to pt that she did the right thing by calling EMS at first sign of unilateral numbness, provided educ re signs of potential stroke and need to get to hospital ASAP in that case. Pt verbalizes understanding. Pt appears returned to her baseline IND level, no further OT services necessary at present.    Follow Up Recommendations  No OT follow up    Equipment Recommendations  None recommended by OT    Recommendations for Other Services       Precautions / Restrictions Precautions Precautions: Fall Restrictions Weight Bearing Restrictions: No      Mobility Bed Mobility Overal bed mobility: Modified Independent             General bed mobility comments: requires increased time for supine<sit    Transfers Overall transfer level: Modified independent               General transfer comment: requires increased time; uses no AE    Balance Overall balance assessment: Independent                                          ADL either performed or assessed with clinical judgement   ADL Overall ADL's : Independent                                             Vision Baseline Vision/History: Wears glasses Wears Glasses: At all times Patient Visual Report: No change from baseline       Perception     Praxis      Pertinent Vitals/Pain Pain Assessment: No/denies pain     Hand Dominance Right   Extremity/Trunk Assessment Upper Extremity Assessment Upper Extremity Assessment:  (grossly 4+/5 throughout; no focal weakness, sensory or coordination deficit noted)   Lower Extremity Assessment Lower Extremity Assessment:  (grossly 4+/5 throughout; no focal weakness, sensory or coordination deficit noted)   Cervical / Trunk Assessment Cervical / Trunk Assessment: Normal   Communication Communication Communication: No difficulties   Cognition Arousal/Alertness: Awake/alert Behavior During Therapy: WFL for tasks assessed/performed Overall Cognitive Status: Within Functional Limits for tasks assessed                                 General Comments: complains of slight memory loss of late   General Comments  Exercises Other Exercises Other Exercises: Educ: role of OT, stroke awareness, home safety. Bed mobility, transfers, self-feeding, grooming   Shoulder Instructions      Home Living Family/patient expects to be discharged to:: Private residence Living Arrangements: Alone Available Help at Discharge: Family;Available PRN/intermittently Type of Home: House       Home Layout: One level     Bathroom Shower/Tub: Occupational psychologist: Handicapped height Bathroom Accessibility: Yes   Home Equipment: Shower seat   Additional Comments: Drives locally; 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      Prior Functioning/Environment Level of Independence: Independent         Comments: Indep with ADLs, household and community mobilization without assist device; + driving; denies recent fall history.        OT Problem List: Decreased activity tolerance;Decreased strength;Impaired sensation;Impaired UE functional use      OT Treatment/Interventions:      OT Goals(Current goals can be found in the care plan section) Acute Rehab OT Goals Patient Stated Goal: to get home OT Goal Formulation: With patient Time For Goal Achievement: 06/20/20 Potential to Achieve Goals: Good  OT Frequency:     Barriers to D/C:            Co-evaluation              AM-PAC OT "6 Clicks" Daily Activity     Outcome Measure Help from another person eating meals?: None Help from another person taking care of personal grooming?: None Help from another person toileting, which includes using toliet, bedpan, or urinal?: None Help from another person bathing (including washing, rinsing, drying)?: None Help from another person to put on and taking off regular upper body clothing?: None Help from another person to put on and taking off regular lower body clothing?: None 6 Click Score: 24   End of Session Nurse Communication: Mobility status;Other (comment) (removed purewick)  Activity Tolerance: Patient tolerated treatment well Patient left: in chair;with call bell/phone within reach;with chair alarm set  OT Visit Diagnosis: Other abnormalities of gait and mobility (R26.89);Dizziness and giddiness (R42)                Time: 0920-1000 OT Time Calculation (min): 40 min Charges:  OT General Charges $OT Visit: 1 Visit OT Evaluation $OT Eval Low Complexity: 1 Low OT Treatments $Self Care/Home Management : 38-52 mins  Josiah Lobo, PhD, MS, OTR/L ascom [phone] 06/06/20, 10:47 AM

## 2020-06-06 NOTE — Telephone Encounter (Signed)
Patient 's daughter called for [facility] f/u for mother scheduled  on 06-29-2020 at 11:30

## 2020-06-06 NOTE — Telephone Encounter (Signed)
Attempted to schedule.  LMOV to call office.  ° °

## 2020-06-06 NOTE — Telephone Encounter (Signed)
Noted. Will follow.  

## 2020-06-06 NOTE — Discharge Summary (Signed)
[facility] [place] NAME: Amy Morton    MR#:  [id number]  DATE OF BIRTH:  [birth year]  DATE OF ADMISSION:  06/05/2020 ADMITTING PHYSICIAN: Amy N Cox, DO  DATE OF DISCHARGE: 06/06/2020  3:23 PM  PRIMARY CARE PHYSICIAN: [name], MD    ADMISSION DIAGNOSIS:  TIA (transient ischemic attack) [G45.9] Numbness [R20.0] Near syncope [R55]  DISCHARGE DIAGNOSIS:  Principal Problem:   Numbness Active Problems:   Anxiety and depression   Nonrheumatic aortic valve stenosis   CAD (coronary artery disease)   Arthritis   Hypertension   SECONDARY DIAGNOSIS:   Past Medical History:  Diagnosis Date  . Abnormal mammogram 2007   Recommend close follow up  . Anemia    Iron, b12, SPEP, UPEP normal, 12/2012  . Anxiety   . Aortic stenosis    Severe by [facility] 05/2020  . aortic stenosis   . Arthritis   . Asthma   . CAD (coronary artery disease)    s/p angioplasty 1996  . Chronic cough   . Depression   . Diverticulosis   . Dyspnea   . Elevated LFTs 2010   s/p ultrasound w/ possible fatty liver; GI consult. Resolution 2011  . Gastric ulcer   . GERD (gastroesophageal reflux disease)   . History of colonic polyps    Dr. Vira Agar  . HOH (hard of hearing)   . Hyperlipidemia   . Hypertension   . Peritoneal carcinomatosis [facility]) 2001   Dr. Oliva Bustard & Dr. Claiborne Rigg s/p chemo  . Shingles 2008   T-9 distribution  . Valvular heart disease    Mild to Moderate MR, AS  . Vitamin D deficiency   . Wheezing     HOSPITAL COURSE:   1.  Acute strokes seen on MRI of the brain in the right parietal area..  Patient presented with left arm numbness.  The patient is already on dual antiplatelet therapy aspirin and Plavix and already on atorvastatin.  Lipid profile shows an LDL of 40.  Appreciate neurology consultation.  Neurology recommends extended cardiac monitoring.  Since the patient sees Dr. Fletcher Anon as outpatient I contacted [facility] cardiology  and a ZIO AT monitor placed prior to disposition.  Patient did well with physical therapy and no PT follow-up recommended. 2.  Moderate aortic stenosis with moderate aortic valve regurgitation.  Will be evaluated for potential TAVR. 3.  Essential hypertension we will restart metoprolol but at a lower dose. 4.  Hyperlipidemia unspecified continue atorvastatin. 5.  Hypokalemia replaced during hospital course 6.  Depression on Remeron 7.  GERD on Protonix 8.  Impaired fasting glucose.  Hemoglobin A1c 6.0.  Patient not a diabetic.  DISCHARGE CONDITIONS:  Satisfactory  CONSULTS OBTAINED:  Treatment Team:  Catarina Hartshorn, MD  DRUG ALLERGIES:   Allergies  Allergen Reactions  . Abilify [Aripiprazole] Other (See Comments)    Burning in Chest Elevated b/p Insomnia  . Celexa [Citalopram]     Caused low sodium level.     DISCHARGE MEDICATIONS:   Allergies as of 06/06/2020      Reactions   Abilify [aripiprazole] Other (See Comments)   Burning in Chest Elevated b/p Insomnia   Celexa [citalopram]    Caused low sodium level.       Medication List    STOP taking these medications   busPIRone 5 MG tablet Commonly known as: BUSPAR     TAKE these medications   acetaminophen 500 MG tablet Commonly known as:  TYLENOL Take 1,000 mg by mouth in the morning and at bedtime.   albuterol 108 (90 Base) MCG/ACT inhaler Commonly known as: VENTOLIN HFA Inhale 2 puffs into the lungs every 4 (four) hours as needed for wheezing or shortness of breath.   aspirin 81 MG tablet Take 81 mg by mouth daily.   atorvastatin 80 MG tablet Commonly known as: LIPITOR TAKE 1/2 TABLET(40 MG) BY MOUTH DAILY AT 6 PM What changed: See the new instructions. Notes to patient: Not given this hospital visit   Cholecalciferol 125 MCG (5000 UT) Tabs Take 5,000 Units by mouth daily. Notes to patient: Not given this hospital visit   clopidogrel 75 MG tablet Commonly known as: PLAVIX TAKE 1 TABLET BY  MOUTH DAILY WITH BREAKFAST What changed: when to take this   isosorbide mononitrate 30 MG 24 hr tablet Commonly known as: IMDUR TAKE 1 TABLET(30 MG) BY MOUTH DAILY What changed: See the new instructions. Notes to patient: Not given this hospital visit   loperamide 2 MG tablet Commonly known as: IMODIUM A-D Take 2 mg by mouth 4 (four) times daily as needed for diarrhea or loose stools.   metoprolol tartrate 25 MG tablet Commonly known as: LOPRESSOR Take 1 tablet (25 mg total) by mouth 2 (two) times daily. What changed:   medication strength  See the new instructions.   mirtazapine 30 MG tablet Commonly known as: REMERON TAKE 1 TABLET BY MOUTH EVERY NIGHT AT BEDTIME What changed:   how much to take  how to take this  when to take this  additional instructions   multivitamin capsule Take 1 capsule by mouth daily.   nitroGLYCERIN 0.4 MG SL tablet Commonly known as: NITROSTAT PLACE 1 TABLET UNDER THE TONGUE EVERY 5 MINUTES AS NEEDED FOR CHEST PAIN, 3 DOSES MAX What changed:   how much to take  how to take this  when to take this  reasons to take this  additional instructions   omega-3 acid ethyl esters 1 g capsule Commonly known as: LOVAZA TAKE 2 CAPSULES BY MOUTH TWICE DAILY Notes to patient: Not given this hospital visit   Osteo Advance Tabs Take 1 tablet by mouth daily. Notes to patient: Not given this hospital visit   pantoprazole 40 MG tablet Commonly known as: PROTONIX TAKE 1 TABLET(40 MG) BY MOUTH DAILY What changed: See the new instructions.   Qunol CoQ10/Ubiquinol/Mega 100 MG Caps Generic drug: Ubiquinol Take 100 mg by mouth every morning. Notes to patient: Not given this hospital visit   Refresh Relieva 0.5-0.9 % ophthalmic solution Generic drug: carboxymethylcellul-glycerin Place 1 drop into both eyes daily as needed for dry eyes. Notes to patient: Not given this hospital visit   vitamin B-12 250 MCG tablet Commonly known as:  CYANOCOBALAMIN Take 250 mcg by mouth daily.        DISCHARGE INSTRUCTIONS:   Follow-up PMD 5 days Follow-up cardiology in a few weeks  If you experience worsening of your admission symptoms, develop shortness of breath, life threatening emergency, suicidal or homicidal thoughts you must seek medical attention immediately by calling 911 or calling your MD immediately  if symptoms less severe.  You Must read complete instructions/literature along with all the possible adverse reactions/side effects for all the Medicines you take and that have been prescribed to you. Take any new Medicines after you have completely understood and accept all the possible adverse reactions/side effects.   Please note  You were cared for by a hospitalist during your hospital stay. If  you have any questions about your discharge medications or the care you received while you were in the hospital after you are discharged, you can call the unit and asked to speak with the hospitalist on call if the hospitalist that took care of you is not available. Once you are discharged, your primary care physician will handle any further medical issues. Please note that NO REFILLS for any discharge medications will be authorized once you are discharged, as it is imperative that you return to your primary care physician (or establish a relationship with a primary care physician if you do not have one) for your aftercare needs so that they can reassess your need for medications and monitor your lab values.    Today   CHIEF COMPLAINT:   Numbness left arm  HISTORY OF PRESENT ILLNESS:  Amy Morton  is a 85 y.o. female presented with numbness on the left side   VITAL SIGNS:  Blood pressure 139/73, pulse 94, temperature 97.8 F (36.6 C), resp. rate 17, height 4\' 11"  (1.499 m), weight 61.2 kg, SpO2 99 %.  I/O:    Intake/Output Summary (Last 24 hours) at 06/06/2020 1725 Last data filed at 06/06/2020 1350 Gross per 24  hour  Intake 1227.77 ml  Output 250 ml  Net 977.77 ml    PHYSICAL EXAMINATION:  GENERAL:  85 y.o.-year-old patient lying in the bed with no acute distress.  EYES: Pupils equal, round, reactive to light and accommodation. No scleral icterus. Extraocular muscles intact.  HEENT: Head atraumatic, normocephalic. Oropharynx and nasopharynx clear.  LUNGS: Normal breath sounds bilaterally, no wheezing, rales,rhonchi or crepitation. No use of accessory muscles of respiration.  CARDIOVASCULAR: S1, S2 normal. No murmurs, rubs, or gallops.  ABDOMEN: Soft, non-tender, non-distended.  EXTREMITIES: No pedal edema.  NEUROLOGIC: Cranial nerves II through XII are intact. Muscle strength 5/5 in all extremities. Sensation intact. Gait not checked.  PSYCHIATRIC: The patient is alert and oriented x 3.  SKIN: No obvious rash, lesion, or ulcer.   DATA REVIEW:   CBC Recent Labs  Lab 06/05/20 1413  WBC 10.9*  HGB 12.4  HCT 36.2  PLT 222    Chemistries  Recent Labs  Lab 06/05/20 1413  NA 134*  K 3.4*  CL 97*  CO2 23  GLUCOSE 133*  BUN 14  CREATININE 0.76  CALCIUM 8.9    Microbiology Results  Results for orders placed or performed during the hospital encounter of 06/05/20  Respiratory Panel by RT PCR (Flu A&B, Covid) - Nasopharyngeal Swab     Status: None   Collection Time: 06/05/20  5:31 PM   Specimen: Nasopharyngeal Swab  Result Value Ref Range Status   SARS Coronavirus 2 by RT PCR NEGATIVE NEGATIVE Final    Comment: (NOTE) SARS-CoV-2 target nucleic acids are NOT DETECTED.  The SARS-CoV-2 RNA is generally detectable in upper respiratoy specimens during the acute phase of infection. The lowest concentration of SARS-CoV-2 viral copies this assay can detect is 131 copies/mL. A negative result does not preclude SARS-Cov-2 infection and should not be used as the sole basis for treatment or other patient management decisions. A negative result may occur with  improper specimen  collection/handling, submission of specimen other than nasopharyngeal swab, presence of viral mutation(s) within the areas targeted by this assay, and inadequate number of viral copies (<131 copies/mL). A negative result must be combined with clinical observations, patient history, and epidemiological information. The expected result is Negative.  Fact Sheet for Patients:  PinkCheek.be  Fact  Sheet for Healthcare Providers:  GravelBags.it  This test is no t yet approved or cleared by the [place] FDA and  has been authorized for detection and/or diagnosis of SARS-CoV-2 by FDA under an Emergency Use Authorization (EUA). This EUA will remain  in effect (meaning this test can be used) for the duration of the COVID-19 declaration under Section 564(b)(1) of the Act, 21 U.S.C. section 360bbb-3(b)(1), unless the authorization is terminated or revoked sooner.     Influenza A by PCR NEGATIVE NEGATIVE Final   Influenza B by PCR NEGATIVE NEGATIVE Final    Comment: (NOTE) The Xpert Xpress SARS-CoV-2/FLU/RSV assay is intended as an aid in  the diagnosis of influenza from Nasopharyngeal swab specimens and  should not be used as a sole basis for treatment. Nasal washings and  aspirates are unacceptable for Xpert Xpress SARS-CoV-2/FLU/RSV  testing.  Fact Sheet for Patients: PinkCheek.be  Fact Sheet for Healthcare Providers: GravelBags.it  This test is not yet approved or cleared by the [place] FDA and  has been authorized for detection and/or diagnosis of SARS-CoV-2 by  FDA under an Emergency Use Authorization (EUA). This EUA will remain  in effect (meaning this test can be used) for the duration of the  Covid-19 declaration under Section 564(b)(1) of the Act, 21  U.S.C. section 360bbb-3(b)(1), unless the authorization is  terminated or revoked. Performed at [facility], [address] [place],  [address]     RADIOLOGY:  CT Head Wo Contrast  Result Date: 06/05/2020 CLINICAL DATA:  Left-sided numbness and tingling intermittently EXAM: CT HEAD WITHOUT CONTRAST TECHNIQUE: Contiguous axial images were obtained from the base of the skull through the vertex without intravenous contrast. COMPARISON:  Dec 16, 2019 FINDINGS: Brain: There is age related volume loss. There is no intracranial mass, hemorrhage, extra-axial fluid collection, or midline shift. There is mild small vessel disease in the centra semiovale bilaterally. Elsewhere brain parenchyma appears unremarkable. No evident acute infarct. Vascular: No hyperdense vessel. There is calcification in each distal vertebral artery and carotid siphon region. Skull: The bony calvarium appears intact. Sinuses/Orbits: There is opacification in seen oil sinus regions bilaterally as well as in several ethmoid air cells. Visualized orbits appear symmetric bilaterally. Other: Mastoid air cells are clear. IMPRESSION: Age related volume loss with mild periventricular small vessel disease. No acute infarct. No mass or hemorrhage. There are foci of arterial vascular calcification. There are areas of paranasal sinus disease, most notably in the sphenoid sinus regions. Electronically Signed   By: Lowella Grip III M.D.   On: 06/05/2020 17:12   MR ANGIO HEAD WO CONTRAST  Result Date: 06/06/2020 CLINICAL DATA:  Transient ischemic attack (TIA) EXAM: MRI HEAD WITHOUT CONTRAST MRA HEAD WITHOUT CONTRAST TECHNIQUE: Multiplanar, multiecho pulse sequences of the brain and surrounding structures were obtained without intravenous contrast. Angiographic images of the head were obtained using MRA technique without contrast. COMPARISON:  06/05/2020 head CT and prior. FINDINGS: MRI HEAD FINDINGS Brain: Tiny right parietal foci of DWI hyperintensity (5:35). Remote left cerebellar and right thalamic microhemorrhages. No midline  shift, ventriculomegaly or extra-axial fluid collection. No mass lesion. Mild cerebral atrophy with ex vacuo dilatation. Chronic left thalamic lacunar insult. Mild chronic microvascular ischemic changes. Sequela of bilateral anterior temporal insults. Vascular: Please see MRA. Skull and upper cervical spine: Normal marrow signal. Sinuses/Orbits: Sequela of bilateral lens replacement. Mild ethmoid, maxillary and sphenoid sinus mucosal thickening. Clear mastoid air cells. Other: None. MRA HEAD FINDINGS Anterior circulation: Patent ICAs. Moderate  to severe narrowing of the distal left ICA cervical segment. Mild right supraclinoid ICA narrowing. Right A1 segment hypoplasia. Patent ACAs and MCAs. No aneurysm, or vascular malformation. Posterior circulation: Patent V4 segments. Mild narrowing of the mid right V4 segment and linear filling defect may reflect a web versus atheromatous plaque. Patent left PICA. Patent basilar and superior cerebellar arteries. Dominant right AICA. Patent posterior cerebral arteries. No significant stenosis, proximal occlusion, aneurysm, or vascular malformation. Venous sinuses: No evidence of thrombosis. Anatomic variants: None of significance. IMPRESSION: MRI head: Tiny right parietal acute/subacute infarcts. Chronic left thalamic lacunar insult. Mild cerebral atrophy and chronic microvascular ischemic changes. Sequela of remote anterior temporal insults. MRA head: Moderate to severe distal left ICA cervical segment narrowing. Mild right supraclinoid ICA and mid right V4 segment narrowing. These results will be called to the ordering clinician or representative by the Radiologist Assistant, and communication documented in the PACS or [employer]. Electronically Signed   By: Primitivo Gauze M.D.   On: 06/06/2020 08:22   MR BRAIN WO CONTRAST  Result Date: 06/06/2020 CLINICAL DATA:  Transient ischemic attack (TIA) EXAM: MRI HEAD WITHOUT CONTRAST MRA HEAD WITHOUT CONTRAST  TECHNIQUE: Multiplanar, multiecho pulse sequences of the brain and surrounding structures were obtained without intravenous contrast. Angiographic images of the head were obtained using MRA technique without contrast. COMPARISON:  06/05/2020 head CT and prior. FINDINGS: MRI HEAD FINDINGS Brain: Tiny right parietal foci of DWI hyperintensity (5:35). Remote left cerebellar and right thalamic microhemorrhages. No midline shift, ventriculomegaly or extra-axial fluid collection. No mass lesion. Mild cerebral atrophy with ex vacuo dilatation. Chronic left thalamic lacunar insult. Mild chronic microvascular ischemic changes. Sequela of bilateral anterior temporal insults. Vascular: Please see MRA. Skull and upper cervical spine: Normal marrow signal. Sinuses/Orbits: Sequela of bilateral lens replacement. Mild ethmoid, maxillary and sphenoid sinus mucosal thickening. Clear mastoid air cells. Other: None. MRA HEAD FINDINGS Anterior circulation: Patent ICAs. Moderate to severe narrowing of the distal left ICA cervical segment. Mild right supraclinoid ICA narrowing. Right A1 segment hypoplasia. Patent ACAs and MCAs. No aneurysm, or vascular malformation. Posterior circulation: Patent V4 segments. Mild narrowing of the mid right V4 segment and linear filling defect may reflect a web versus atheromatous plaque. Patent left PICA. Patent basilar and superior cerebellar arteries. Dominant right AICA. Patent posterior cerebral arteries. No significant stenosis, proximal occlusion, aneurysm, or vascular malformation. Venous sinuses: No evidence of thrombosis. Anatomic variants: None of significance. IMPRESSION: MRI head: Tiny right parietal acute/subacute infarcts. Chronic left thalamic lacunar insult. Mild cerebral atrophy and chronic microvascular ischemic changes. Sequela of remote anterior temporal insults. MRA head: Moderate to severe distal left ICA cervical segment narrowing. Mild right supraclinoid ICA and mid right V4  segment narrowing. These results will be called to the ordering clinician or representative by the Radiologist Assistant, and communication documented in the PACS or [employer]. Electronically Signed   By: Primitivo Gauze M.D.   On: 06/06/2020 08:22   US Carotid Bilateral (at [facility] and [facility] only)  Result Date: 06/06/2020 CLINICAL DATA:  Numbness, syncope and hypertension. EXAM: BILATERAL CAROTID DUPLEX ULTRASOUND TECHNIQUE: Pearline Cables scale imaging, color Doppler and duplex ultrasound were performed of bilateral carotid and vertebral arteries in the neck. COMPARISON:  None. FINDINGS: Criteria: Quantification of carotid stenosis is based on velocity parameters that correlate the residual internal carotid diameter with NASCET-based stenosis levels, using the diameter of the distal internal carotid lumen as the denominator for stenosis measurement. The following velocity measurements were obtained: RIGHT ICA:  74/23 cm/sec CCA:  78/93 cm/sec SYSTOLIC ICA/CCA RATIO:  1.0 ECA:  83 cm/sec LEFT ICA:  93/27 cm/sec CCA:  81/01 cm/sec SYSTOLIC ICA/CCA RATIO:  1.3 ECA:  94 cm/sec RIGHT CAROTID ARTERY: Mild calcified plaque at the level of the carotid bulb that approaches the ICA origin. No evidence of significant right ICA stenosis with estimated stenosis of less than 50%. RIGHT VERTEBRAL ARTERY: Antegrade flow with normal waveform and velocity. LEFT CAROTID ARTERY: Mild calcified plaque at the level of the distal left common carotid artery, carotid bulb and left ICA origin. Estimated left ICA stenosis is less than 50%. LEFT VERTEBRAL ARTERY: Antegrade flow with normal waveform and velocity. IMPRESSION: Mild plaque at the level of bilateral carotid bulbs and ICA origins. Estimated bilateral ICA stenoses are less than 50%. Electronically Signed   By: Aletta Edouard M.D.   On: 06/06/2020 08:21   DG Chest Portable 1 View  Result Date: 06/05/2020 CLINICAL DATA:  Shortness of breath. EXAM: PORTABLE CHEST 1 VIEW  COMPARISON:  March 10, 2020. FINDINGS: The heart size and mediastinal contours are within normal limits. Minimal bibasilar subsegmental atelectasis or scarring is noted. No pneumothorax or pleural effusion is noted. The visualized skeletal structures are unremarkable. IMPRESSION: Minimal bibasilar subsegmental atelectasis or scarring. Aortic Atherosclerosis (ICD10-I70.0). Electronically Signed   By: Marijo Conception M.D.   On: 06/05/2020 17:26     Management plans discussed with the patient, family and they are in agreement.  CODE STATUS:     Code Status Orders  (From admission, onward)         Start     Ordered   06/06/20 0028  Full code  Continuous        06/06/20 0033        Code Status History    Date Active Date Inactive Code Status Order ID Comments User Context   05/16/2020 1559 05/17/2020 0014 Full Code [id number]  Wellington Hampshire, MD Inpatient   01/22/2018 2003 01/25/2018 2254 Partial Code [id number]  Gorden Harms, MD ED   01/22/2018 2001 01/22/2018 2002 Full Code [id number]  Gorden Harms, MD ED   04/27/2015 1302 04/29/2015 1500 Full Code [id number]  Jettie Booze, MD Inpatient   Advance Care Planning Activity    Advance Directive Documentation     Most Recent Value  Type of Advance Directive Healthcare Power of Attorney  Pre-existing out of facility DNR order (yellow form or pink MOST form) --  "MOST" Form in Place? --      TOTAL TIME TAKING CARE OF THIS PATIENT: 40 minutes.    Loletha Grayer M.D on 06/06/2020 at 5:25 PM  Between 7am to 6pm - Pager - [phone]  After 6pm go to [email or web address] - password [email or web address]  [facility]  CC: Primary care physician; [name], MD

## 2020-06-06 NOTE — Progress Notes (Signed)
Pt discharged home.  Stroke education given, verbalized understanding.  Discharge education given, verbalized understanding. No s/s of distress, VSS.  Pt transported home by [name] (sister in Sports coach) in private vehicle.

## 2020-06-06 NOTE — Evaluation (Signed)
Physical Therapy Evaluation Patient Details Name: Amy Morton MRN: [id number] DOB: 1934/10/03 Today's Date: 06/06/2020   History of Present Illness  presented to ER secondary to acute onset of L UE numbness; admitted for TIA/CVA management.  MRI significant for small R parietal acute/subacute infarct  Clinical Impression  Upon evaluation, patient alert and oriented; follows commands and agreeable to participation with session.  Denies pain; reports initial symptoms have fully resolved at this time.  Bilat UE/LE strength and ROM grossly symmetrical and WFL; no focal weakness, sensory or coordination deficit appreciated.  Able to complete sit/stand, basic transfers and gait (400') without assist device, mod indep/indpe.  Demonstrates reciprocal stepping pattern with fair step height/length; fair/good trunk rotation and arm swing.  Decreased cadence,but no overt buckling or LOB.  Completes dynamic gait components without LOB or safety concern. Appears to be at baseline level of functional ability; no acute PT needs identified at this time. Patient in agreement.  Will complete initial order; please re-consult should needs change.    Follow Up Recommendations No PT follow up    Equipment Recommendations       Recommendations for Other Services       Precautions / Restrictions Precautions Precautions: Fall Restrictions Weight Bearing Restrictions: No      Mobility  Bed Mobility Overal bed mobility: Modified Independent             General bed mobility comments: seated in recliner beginning/end of session    Transfers Overall transfer level: Modified independent Equipment used: None             General transfer comment: good LE strength/power with movement transition  Ambulation/Gait Ambulation/Gait assistance: Modified independent (Device/Increase time) Gait Distance (Feet): 400 Feet Assistive device: None   Gait velocity: 10' walk time, 8-9 seconds    General Gait Details: reciprocal stepping pattern with fair step height/length; fair/good trunk rotation and arm swing.  Decreased cadence,but no overt buckling or LOB.  Completes dynamic gait components without LOB or safety concern.  Stairs            Wheelchair Mobility    Modified Rankin (Stroke Patients Only)       Balance Overall balance assessment: Needs assistance Sitting-balance support: No upper extremity supported;Feet supported Sitting balance-Leahy Scale: Good     Standing balance support: No upper extremity supported Standing balance-Leahy Scale: Good                               Pertinent Vitals/Pain Pain Assessment: No/denies pain    Home Living Family/patient expects to be discharged to:: Private residence Living Arrangements: Alone Available Help at Discharge: Family;Available PRN/intermittently Type of Home: House       Home Layout: One level Home Equipment: Shower seat Additional Comments: Drives locally; 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    Prior Function Level of Independence: Independent         Comments: Indep with ADLs, household and community mobilization without assist device; + driving; denies recent fall history.     Hand Dominance   Dominant Hand: Right    Extremity/Trunk Assessment   Upper Extremity Assessment Upper Extremity Assessment:  (grossly 4+/5 throughout; no focal weakness, sensory or coordination deficit noted)    Lower Extremity Assessment Lower Extremity Assessment:  (grossly 4+/5 throughout; no focal weakness, sensory or coordination deficit noted)    Cervical / Trunk Assessment Cervical / Trunk  Assessment: Normal  Communication   Communication: No difficulties  Cognition Arousal/Alertness: Awake/alert Behavior During Therapy: WFL for tasks assessed/performed Overall Cognitive Status: Within Functional Limits for tasks assessed                                  General Comments: complains of slight memory loss of late      General Comments      Exercises Other Exercises Other Exercises: Educ: role of OT, stroke awareness, home safety. Bed mobility, transfers, self-feeding, grooming   Assessment/Plan    PT Assessment Patent does not need any further PT services  PT Problem List Decreased activity tolerance;Decreased balance;Decreased mobility       PT Treatment Interventions DME instruction;Gait training;Functional mobility training;Therapeutic activities;Patient/family education    PT Goals (Current goals can be found in the Care Plan section)  Acute Rehab PT Goals Patient Stated Goal: to get home Time For Goal Achievement: 06/20/20 Potential to Achieve Goals: Good    Frequency     Barriers to discharge        Co-evaluation               AM-PAC PT "6 Clicks" Mobility  Outcome Measure Help needed turning from your back to your side while in a flat bed without using bedrails?: None Help needed moving from lying on your back to sitting on the side of a flat bed without using bedrails?: None Help needed moving to and from a bed to a chair (including a wheelchair)?: None Help needed standing up from a chair using your arms (e.g., wheelchair or bedside chair)?: None Help needed to walk in hospital room?: None Help needed climbing 3-5 steps with a railing? : None 6 Click Score: 24    End of Session Equipment Utilized During Treatment: Gait belt Activity Tolerance: Patient tolerated treatment well Patient left: in chair;with call bell/phone within reach;with chair alarm set Nurse Communication: Mobility status PT Visit Diagnosis: Muscle weakness (generalized) (M62.81);Difficulty in walking, not elsewhere classified (R26.2)    Time: 7262-0355 PT Time Calculation (min) (ACUTE ONLY): 15 min   Charges:   PT Evaluation $PT Eval Low Complexity: 1 Low         Facundo Allemand H. Owens Shark, PT, DPT,  NCS 06/06/20, 10:51 AM [phone]

## 2020-06-06 NOTE — Telephone Encounter (Signed)
Secure chat message received from Christell Faith, [place] stating that the patient was admitted to [facility] for a stroke.  She needed to have a ZIO AT placed prior to discharge today.  I did go to the floor and place her 14-day ZIO AT. This was explained to the patient and her family member, [name].   They voiced understanding.  Monitor registered in ZioSuite.  Serial # [id number]

## 2020-06-06 NOTE — Telephone Encounter (Signed)
Patient is currently admitted to the hospital. Will await work up and discharge prior to increasing the dose.

## 2020-06-06 NOTE — Plan of Care (Signed)
New care plan initiated 

## 2020-06-06 NOTE — Plan of Care (Signed)
  Problem: Clinical Measurements: Goal: Ability to maintain clinical measurements within normal limits will improve Outcome: Progressing Goal: Will remain free from infection Outcome: Progressing Goal: Respiratory complications will improve Outcome: Progressing Goal: Cardiovascular complication will be avoided Outcome: Progressing   

## 2020-06-06 NOTE — Progress Notes (Signed)
SLP Cancellation Note  Patient Details Name: MEITAL RIEHL MRN: [id number] DOB: 07/13/1935   Cancelled treatment:       Reason Eval/Treat Not Completed: SLP screened, no needs identified, will sign off  Kollins Fenter B. Rutherford Nail M.S., CCC-SLP, [place] Office [phone]  Stormy Fabian 06/06/2020, 10:34 AM

## 2020-06-06 NOTE — Progress Notes (Signed)
Zio AT placed prior to discharge per neurology recommendations.

## 2020-06-06 NOTE — Telephone Encounter (Signed)
-----   Message from Rise Mu, PA-C sent at 06/06/2020  1:36 PM EST ----- Please schedule her for hospital follow up in 4-6 weeks for Zio review

## 2020-06-06 NOTE — Consult Note (Signed)
Neurology Consultation Reason for Consult: Stroke Referring Physician: Earleen Newport, R  CC: Transient left arm numbness  History is obtained from: Patient  HPI: Amy Morton is a 85 y.o. female with a history of aortic stenosis, hypertension, hyperlipidemia, who presents with transient left arm numbness.  She states that it lasted approximately a minute, and did not involve her face or her leg.  Due to the symptoms she presented to the emergency department to be evaluated where she was admitted for TIA work-up.  As part of her work-up for TIA, an MRI was performed which demonstrates juxtacortical diffusion change in the right parietal region.   LKW: 11/9, around noon tpa given?: no, resolution of symptoms    ROS: A 14 point ROS was performed and is negative except as noted in the HPI.  Past Medical History:  Diagnosis Date  . Abnormal mammogram 2007   Recommend close follow up  . Anemia    Iron, b12, SPEP, UPEP normal, 12/2012  . Anxiety   . Aortic stenosis    Severe by [facility] 05/2020  . aortic stenosis   . Arthritis   . Asthma   . CAD (coronary artery disease)    s/p angioplasty 1996  . Chronic cough   . Depression   . Diverticulosis   . Dyspnea   . Elevated LFTs 2010   s/p ultrasound w/ possible fatty liver; GI consult. Resolution 2011  . Gastric ulcer   . GERD (gastroesophageal reflux disease)   . History of colonic polyps    Dr. Vira Agar  . HOH (hard of hearing)   . Hyperlipidemia   . Hypertension   . Peritoneal carcinomatosis [facility]) 2001   Dr. Oliva Bustard & Dr. Claiborne Rigg s/p chemo  . Shingles 2008   T-9 distribution  . Valvular heart disease    Mild to Moderate MR, AS  . Vitamin D deficiency   . Wheezing      Family History  Problem Relation Age of Onset  . Hypertension Mother   . Stroke Mother 73       Cerebral Hemorrhage  . Cancer Father        bone cancer  . Diabetes Sister   . Diabetes Brother   . Heart disease Brother   . Cancer Daughter 77        Ovarian cancer  . Colon cancer Neg Hx   . Colon polyps Neg Hx      Social History:  reports that she has never smoked. She has never used smokeless tobacco. She reports that she does not drink alcohol and does not use drugs.   Exam: Current vital signs: BP (!) 171/81 (BP Location: Right Arm)   Pulse 93   Temp 97.9 F (36.6 C)   Resp 18   Ht 4\' 11"  (1.499 m)   Wt 61.2 kg   SpO2 98%   BMI 27.25 kg/m  Vital signs in last 24 hours: Temp:  [97.7 F (36.5 C)-98.1 F (36.7 C)] 97.9 F (36.6 C) (11/10 0823) Pulse Rate:  [75-97] 93 (11/10 0823) Resp:  [11-26] 18 (11/10 0823) BP: (138-185)/(67-95) 171/81 (11/10 0823) SpO2:  [94 %-98 %] 98 % (11/10 0823) Weight:  [61.2 kg] 61.2 kg (11/09 1419)   Physical Exam  Constitutional: Appears well-developed and well-nourished.  Psych: Affect appropriate to situation Eyes: No scleral injection HENT: No OP obstrucion MSK: no joint deformities.  Cardiovascular: Normal rate and regular rhythm.  Respiratory: Effort normal, non-labored breathing GI: Soft.  No distension. There  is no tenderness.  Skin: WDI  Neuro: Mental Status: Patient is awake, alert, oriented to person, place, month, year, and situation. Patient is able to give a clear and coherent history. No signs of aphasia or neglect Cranial Nerves: II: Visual Fields are full. Pupils are equal, round, and reactive to light.   III,IV, VI: EOMI without ptosis or diploplia.  V: Facial sensation is symmetric to temperature VII: Facial movement is symmetric.  VIII: hearing is intact to voice X: Uvula elevates symmetrically XI: Shoulder shrug is symmetric. XII: tongue is midline without atrophy or fasciculations.  Motor: Tone is normal. Bulk is normal. 5/5 strength was present in all four extremities.  Sensory: Sensation is symmetric to light touch and temperature in the arms and legs. Deep Tendon Reflexes: 2+ and symmetric in the biceps and patellae.  Plantars: Toes are  downgoing bilaterally.  Cerebellar: FNF and HKS are intact bilaterally   I have reviewed labs in epic and the results pertinent to this consultation are: LDL 40 A1c 6.0  I have reviewed the images obtained: MRI brain - Small ischemic stroke in the right parietal region.   Impression: 85 yo F with small likely embolic ischemic event. She is already on dual antiplatelet therapy with good lipid control.  I do think that she could benefit from prolonged cardiac monitoring to look for atrial fibrillation as an outpatient.  Recommendations: 1) continue dual antiplatelet therapy (only monotherapy as needed from stroke prevention after 3 weeks but patient was already on this prior to admission) 2) continue statin therapy 3) recommend extended cardiac monitoring 4) no further recommendations other than the above.  Roland Rack, MD [facility] [phone]  If 7pm- 7am, please page neurology on call as listed in [facility].

## 2020-06-06 NOTE — Plan of Care (Signed)
  Problem: Clinical Measurements: Goal: Ability to maintain clinical measurements within normal limits will improve Outcome: Progressing Goal: Will remain free from infection Outcome: Progressing Goal: Respiratory complications will improve Outcome: Progressing Goal: Cardiovascular complication will be avoided Outcome: Progressing   Problem: Coping: Goal: Will verbalize positive feelings about self Outcome: Progressing Goal: Will identify appropriate support needs Outcome: Progressing   Problem: Self-Care: Goal: Ability to participate in self-care as condition permits will improve Outcome: Progressing   Problem: Ischemic Stroke/TIA Tissue Perfusion: Goal: Complications of ischemic stroke/TIA will be minimized Outcome: Progressing

## 2020-06-07 DIAGNOSIS — G464 Cerebellar stroke syndrome: Secondary | ICD-10-CM | POA: Diagnosis not present

## 2020-06-07 MED ORDER — MIRTAZAPINE 45 MG PO TABS: ORAL_TABLET | ORAL | 3 refills | Status: DC

## 2020-06-07 NOTE — Addendum Note (Signed)
Addended by: Caryl Bis Amarie Tarte G on: 06/07/2020 01:18 PM   Modules accepted: Orders

## 2020-06-07 NOTE — Telephone Encounter (Signed)
Transition Care Management Follow-up Telephone Call  Date of discharge and from where: 06/13/20 from [facility]  How have you been since you were released from the hospital? Information received from sister, HIPAA compliant. Notes patient is doing well. No signs/symptoms presenting. Baseline.  Any questions or concerns? No  Items Reviewed:  Did the pt receive and understand the discharge instructions provided? Yes   Medications obtained and verified? Sister notes she is Engineer, civil (consulting) and no issues. Taking as directed.  Any new allergies since your discharge? No   Dietary orders reviewed? Yes  Do you have support at home? Yes . Family and Friends around the clock..   Home Care and Equipment/Supplies: Were home health services ordered? No Were any new equipment or medical supplies ordered?  No  Functional Questionnaire: (I = Independent and D = Dependent) ADLs: i  Bathing/Dressing- i  Meal Prep- i  Eating- i  Maintaining continence- i  Transferring/Ambulation- i  Managing Meds- Sister assist prn   Follow up appointments reviewed:   PCP Hospital f/u appt confirmed? Yes  Scheduled to see Dr. Caryl Bis on 06/13/20 @ 11:30.  [facility] f/u appt confirmed? Yes  Scheduled to see Cardiology Consult on 06/11/20 @ 11:20.  Are transportation arrangements needed? No   If their condition worsens, is the pt aware to call PCP or go to the Emergency Dept.? Yes  Was the patient provided with contact information for the PCP's office or ED? Yes  Was to pt encouraged to call back with questions or concerns? Yes

## 2020-06-08 NOTE — Telephone Encounter (Signed)
Reviewed

## 2020-06-09 ENCOUNTER — Other Ambulatory Visit: Payer: Self-pay | Admitting: Internal Medicine

## 2020-06-09 LAB — METHYLMALONIC ACID, SERUM: Methylmalonic Acid, Quantitative: 64 nmol/L (ref 0–378)

## 2020-06-11 ENCOUNTER — Other Ambulatory Visit: Payer: Self-pay

## 2020-06-11 ENCOUNTER — Encounter: Payer: Self-pay | Admitting: Cardiovascular Disease

## 2020-06-11 ENCOUNTER — Ambulatory Visit (INDEPENDENT_AMBULATORY_CARE_PROVIDER_SITE_OTHER): Payer: Medicare Other | Admitting: Cardiovascular Disease

## 2020-06-11 VITALS — BP 150/90 | HR 88 | Ht 59.0 in | Wt 133.0 lb

## 2020-06-11 DIAGNOSIS — I200 Unstable angina: Secondary | ICD-10-CM | POA: Diagnosis not present

## 2020-06-11 DIAGNOSIS — I350 Nonrheumatic aortic (valve) stenosis: Secondary | ICD-10-CM

## 2020-06-11 NOTE — Patient Instructions (Signed)
Medication Instructions:  Your physician recommends that you continue on your current medications as directed. Please refer to the Current Medication list given to you today.  *If you need a refill on your cardiac medications before your next appointment, please call your pharmacy*   Lab Work: None If you have labs (blood work) drawn today and your tests are completely normal, you will receive your results only by: Marland Kitchen MyChart Message (if you have MyChart) OR . A paper copy in the mail If you have any lab test that is abnormal or we need to change your treatment, we will call you to review the results.   Testing/Procedures: Please see instruction letter for appointments   Follow-Up: You will be scheduled to follow up with Dr. Angelena Form or the TAVR team after your procedure.   Other Instructions

## 2020-06-11 NOTE — Progress Notes (Signed)
[facility] Consult Note  Chief Complaint  Patient presents with   Follow-up    aortic stenosis   History of Present Illness:85 yo female with history of CAD, anemia, anxiety, depression, GERD, hyperlipidemia, HTN, severe aortic stenosis and moderate to severe aortic insufficiency who is here today as a new consult, referred by Dr. Fletcher Anon, for further discussion regarding her aortic stenosis and possible TAVR. She is known to have CAD with known CTO of the RCA, prior stenting of the Circumflex artery and moderate LAD disease. Her CAD was stable by cardiac cath in October 2021. She has been followed for moderate aortic stenosis with moderate to severe aortic insufficiency. Most recent echo August 2021 with LVEF>75%, severe LVH, normal RV function. Moderate MR. Moderately severe aortic stenosis with moderate to severe aortic insufficiency. Mean gradient 26 mmHg, peak gradient 48 mmHg, AVA 1.18 cm2 with dimensionless index 0.38. By cardiac cath her mean gradient was 30 mmHg and AVA 0.8 cm2. She was admitted to [facility] 06/05/20 with a TIA. She is wearing a cardiac monitor now. Mild carotid artery disease by dopplers 06/06/20.   She tells me today that she has had progressive dyspnea and fatigue. No chest pain. No LE edema. She has full dentures. She lives alone in [place], [place]. She is retired from a Special educational needs teacher. She is here today with her sister in law.   Primary Care Physician: [name], MD Primary Cardiologist: Fletcher Anon Referring Cardiologist: Fletcher Anon  Past Medical History:  Diagnosis Date   Abnormal mammogram 2007   Recommend close follow up   Anemia    Iron, b12, SPEP, UPEP normal, 12/2012   Anxiety    Aortic stenosis    Severe by [facility] 05/2020   aortic stenosis    Arthritis    Asthma    CAD (coronary artery disease)    s/p angioplasty 1996   Chronic cough    Depression    Diverticulosis    Dyspnea    Elevated LFTs 2010   s/p ultrasound w/ possible  fatty liver; GI consult. Resolution 2011   Gastric ulcer    GERD (gastroesophageal reflux disease)    History of colonic polyps    Dr. Vira Agar   [facility] (hard of hearing)    Hyperlipidemia    Hypertension    Peritoneal carcinomatosis [facility]) 2001   Dr. Oliva Bustard & Dr. Claiborne Rigg s/p chemo   Shingles 2008   T-9 distribution   Valvular heart disease    Mild to Moderate MR, AS   Vitamin D deficiency    Wheezing     Past Surgical History:  Procedure Laterality Date   ABDOMINAL HYSTERECTOMY  2001   APPENDECTOMY     CARDIAC CATHETERIZATION  2007   50% mid LAD stenosis, 70% proximal RCA with 100% distal RCA stenosis with collaterals, EF 65%   CARDIAC CATHETERIZATION N/A 04/27/2015   Procedure: Left Heart Cath and Coronary Angiography;  Surgeon: Jettie Booze, MD;  Location: [place] [facility];  Service: Cardiovascular;  Laterality: N/A;   CARDIAC CATHETERIZATION N/A 04/27/2015   Procedure: Coronary Stent Intervention;  Surgeon: Jettie Booze, MD;  Location: [place] [facility];  Service: Cardiovascular;  Laterality: N/A;   CATARACT EXTRACTION W/PHACO Left 09/25/2016   Procedure: CATARACT EXTRACTION PHACO AND INTRAOCULAR LENS PLACEMENT (IOC);  Surgeon: Eulogio Bear, MD;  Location: [facility] OR[facility];  Service: Ophthalmology;  Laterality: Left;  [place] 46.7 AP% 8.1 CDE 3.80 Fluid pack lot # [id number]   CATARACT EXTRACTION  W/PHACO Right 11/06/2016   Procedure: CATARACT EXTRACTION PHACO AND INTRAOCULAR LENS PLACEMENT (IOC);  Surgeon: Eulogio Bear, MD;  Location: [facility] ORS;  Service: Ophthalmology;  Laterality: Right;  [place] 49.5 AP8.7 CDE4.28 LOT # [id number]   CHOLECYSTECTOMY  1987   COLECTOMY  2001   CORONARY ANGIOPLASTY  1996   CORONARY ANGIOPLASTY     LAPAROSCOPIC BILATERAL SALPINGO OOPHERECTOMY  1988   Dr. Wilton CATH AND CORONARY ANGIOGRAPHY N/A 01/25/2018   Procedure: LEFT HEART CATH AND CORONARY ANGIOGRAPHY;  Surgeon: Wellington Hampshire, MD;  Location:  [place] [facility];  Service: Cardiovascular;  Laterality: N/A;   RIGHT/LEFT HEART CATH AND CORONARY ANGIOGRAPHY N/A 05/16/2020   Procedure: RIGHT/LEFT HEART CATH AND CORONARY ANGIOGRAPHY;  Surgeon: Wellington Hampshire, MD;  Location: [place] [facility];  Service: Cardiovascular;  Laterality: N/A;   TONSILLECTOMY  1950    Current Outpatient Medications  Medication Sig Dispense Refill   acetaminophen (TYLENOL) 500 MG tablet Take 1,000 mg by mouth in the morning and at bedtime.     albuterol (VENTOLIN HFA) 108 (90 Base) MCG/ACT inhaler Inhale 2 puffs into the lungs every 4 (four) hours as needed for wheezing or shortness of breath. 18 g 0   aspirin 81 MG tablet Take 81 mg by mouth daily.     atorvastatin (LIPITOR) 80 MG tablet TAKE 1/2 TABLET(40 MG) BY MOUTH DAILY AT 6 PM 45 tablet 1   carboxymethylcellul-glycerin (REFRESH RELIEVA) 0.5-0.9 % ophthalmic solution Place 1 drop into both eyes daily as needed for dry eyes.     Cholecalciferol 5000 units TABS Take 5,000 Units by mouth daily.     clopidogrel (PLAVIX) 75 MG tablet TAKE 1 TABLET BY MOUTH DAILY WITH BREAKFAST 90 tablet 1   isosorbide mononitrate (IMDUR) 30 MG 24 hr tablet TAKE 1 TABLET(30 MG) BY MOUTH DAILY 90 tablet 2   loperamide (IMODIUM A-D) 2 MG tablet Take 2 mg by mouth 4 (four) times daily as needed for diarrhea or loose stools.     metoprolol tartrate (LOPRESSOR) 25 MG tablet Take 1 tablet (25 mg total) by mouth 2 (two) times daily. 60 tablet 0   mirtazapine (REMERON) 45 MG tablet TAKE 1 TABLET BY MOUTH EVERY NIGHT AT BEDTIME 30 tablet 3   Multiple Vitamin (MULTIVITAMIN) capsule Take 1 capsule by mouth daily.     nitroGLYCERIN (NITROSTAT) 0.4 MG SL tablet PLACE 1 TABLET UNDER THE TONGUE EVERY 5 MINUTES AS NEEDED FOR CHEST PAIN, 3 DOSES MAX 25 tablet 1   Nutritional Supplements (OSTEO ADVANCE) TABS Take 1 tablet by mouth daily.      omega-3 acid ethyl esters (LOVAZA) 1 g capsule TAKE 2 CAPSULES BY MOUTH TWICE DAILY  360 capsule 1   pantoprazole (PROTONIX) 40 MG tablet TAKE 1 TABLET(40 MG) BY MOUTH DAILY 90 tablet 2   Ubiquinol (QUNOL COQ10/UBIQUINOL/MEGA) 100 MG CAPS Take 100 mg by mouth every morning.     vitamin B-12 (CYANOCOBALAMIN) 250 MCG tablet Take 250 mcg by mouth daily.     No current facility-administered medications for this visit.   Facility-Administered Medications Ordered in Other Visits  Medication Dose Route Frequency Provider Last Rate Last Admin   sodium chloride 0.9 % nebulizer solution 3 mL  3 mL Nebulization Once [name], MD       Followed by   methacholine (PROVOCHOLINE) inhaler solution 0.125 mg  2 mL Inhalation Once [name], MD       Followed by  methacholine (PROVOCHOLINE) inhaler solution 0.5 mg  2 mL Inhalation Once [name], MD       Followed by   methacholine (PROVOCHOLINE) inhaler solution 2 mg  2 mL Inhalation Once [name], MD       Followed by   methacholine (PROVOCHOLINE) inhaler solution 8 mg  2 mL Inhalation Once [name], MD       Followed by   methacholine (PROVOCHOLINE) inhaler solution 32 mg  2 mL Inhalation Once [name], MD        Allergies  Allergen Reactions   Abilify [Aripiprazole] Other (See Comments)    Burning in Chest Elevated b/p Insomnia   Celexa [Citalopram]     Caused low sodium level.     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 She is widowed and has a daughter [name]), son [name]). She also had a son that was killed in a work related accident [name] - died age).       Caffeine - none   Exercise - walking   Social Determinants of Health   Financial Resource Strain: Low Risk    Difficulty of Paying Living Expenses: Not hard at all  Food Insecurity: No Food Insecurity   Worried About Charity fundraiser in the Last Year: Never true   Ran Out of Food in the Last Year: Never true  Transportation Needs: No Transportation Needs   Lack of Transportation (Medical): No   Lack of Transportation (Non-Medical): No  Physical Activity:    Days of Exercise per Week: Not on file   Minutes of Exercise per Session: Not on file  Stress: No Stress Concern Present   Feeling of Stress : Not at all  Social Connections:    Frequency of Communication with Friends and Family: Not on file   Frequency of Social Gatherings with Friends and Family: Not on file   Attends Religious Services: Not on file   Active Member of Clubs or Organizations: Not on file   Attends Archivist Meetings: Not on file   Marital Status: Not on file  Intimate Partner Violence:    Fear of Current or Ex-Partner: Not on file   Emotionally Abused: Not on file   Physically Abused: Not on file   Sexually Abused: Not on file    Family History  Problem Relation Age of Onset   Hypertension Mother    Stroke Mother 33       Cerebral Hemorrhage   Cancer Father        bone cancer   Diabetes Sister    Diabetes Brother    Heart disease Brother    Cancer Daughter 64       Ovarian  cancer   Colon cancer Neg Hx    Colon polyps Neg Hx     Review of Systems:  As stated in the HPI and otherwise negative.   BP (!) 150/90    Pulse 88    Ht 4\' 11"  (1.499 m)    Wt 133 lb (60.3 kg)    SpO2 97%    BMI 26.86 kg/m   Physical  Examination: General: Well developed, well nourished, NAD  HEENT: OP clear, mucus membranes moist  SKIN: warm, dry. No rashes. Neuro: No focal deficits  Musculoskeletal: Muscle strength 5/5 all ext  Psychiatric: Mood and affect normal  Neck: No JVD, no carotid bruits, no thyromegaly, no lymphadenopathy.  Lungs:Clear bilaterally, no wheezes, rhonci, crackles Cardiovascular: Regular rate and rhythm. Loud, harsh, late peaking systolic murmur.  Abdomen:Soft. Bowel sounds present. Non-tender.  Extremities: No lower extremity edema. Pulses are 2 + in the bilateral DP/PT.  EKG:  EKG is not ordered today. The ekg ordered today demonstrates   Echo 03/06/20: 1. Left ventricular ejection fraction, by estimation, is >75%. The left  ventricle has hyperdynamic function. Left ventricular endocardial border  not optimally defined to evaluate regional wall motion. There is severe  left ventricular hypertrophy. Left  ventricular diastolic parameters are consistent with Grade I diastolic  dysfunction (impaired relaxation). Elevated left atrial pressure.  2. Right ventricular systolic function is low normal. The right  ventricular size is normal. There is normal pulmonary artery systolic  pressure.  3. Left atrial size was mildly dilated.  4. The mitral valve is degenerative. Moderate mitral valve regurgitation.  5. The aortic valve has an indeterminant number of cusps. Aortic valve  regurgitation is moderate to severe. Moderate aortic valve stenosis.  Aortic valve area, by VTI measures 1.18 cm. Aortic valve mean gradient  measures 26.0 mmHg. Aortic valve Vmax  measures 3.47 m/s.  6. The inferior vena cava is normal in size with greater than 50%  respiratory variability, suggesting right atrial pressure of 3 mmHg.   FINDINGS  Left Ventricle: Left ventricular ejection fraction, by estimation, is  >75%. The left ventricle has hyperdynamic function. Left ventricular  endocardial border not  optimally defined to evaluate regional wall motion.  Definity contrast agent was given IV to  delineate the left ventricular endocardial borders. The left ventricular  internal cavity size was normal in size. There is severe left ventricular  hypertrophy. Left ventricular diastolic parameters are consistent with  Grade I diastolic dysfunction  (impaired relaxation). Elevated left atrial pressure.   Right Ventricle: The right ventricular size is normal. No increase in  right ventricular wall thickness. Right ventricular systolic function is  low normal. There is normal pulmonary artery systolic pressure. The  tricuspid regurgitant velocity is 2.44 m/s,  and with an assumed right atrial pressure of 3 mmHg, the estimated right  ventricular systolic pressure is 84.6 mmHg.   Left Atrium: Left atrial size was mildly dilated.   Right Atrium: Right atrial size was normal in size.   Pericardium: There is no evidence of pericardial effusion. Presence of  pericardial fat pad.   Mitral Valve: The mitral valve is degenerative in appearance. Moderate  mitral annular calcification. Moderate mitral valve regurgitation.   Tricuspid Valve: The tricuspid valve is normal in structure. Tricuspid  valve regurgitation is trivial.   Aortic Valve: The aortic valve has an indeterminant number of cusps. .  There is moderate thickening and mild calcification of the aortic valve.  Aortic valve regurgitation is moderate to severe.  Moderate aortic stenosis  is present. There is moderate  thickening of the aortic valve. There is mild calcification of the aortic  valve. Aortic valve mean gradient measures 26.0 mmHg. Aortic valve peak  gradient measures 48.2 mmHg. Aortic valve area, by VTI measures 1.18 cm.   Pulmonic Valve: The pulmonic valve was not well visualized. Pulmonic valve  regurgitation is not visualized. No evidence of pulmonic stenosis.   Aorta: The aortic root and ascending aorta are  structurally normal, with  no evidence of dilitation.   Pulmonary Artery: The pulmonary artery is not well seen.   Venous: The inferior vena cava is normal in size with greater than 50%  respiratory variability, suggesting right atrial pressure of 3 mmHg.   IAS/Shunts: The interatrial septum was not well visualized.     LEFT VENTRICLE  PLAX 2D  LVIDd:     2.90 cm Diastology  LV PW:     1.50 cm LV e' lateral:  4.03 cm/s  LV IVS:    2.20 cm LV E/e' lateral: 14.5  LVOT diam:   2.00 cm LV e' medial:  3.59 cm/s  LV SV:     59    LV E/e' medial: 16.2  LV SV Index:  38  LVOT Area:   3.14 cm     RIGHT VENTRICLE       IVC  RV Basal diam: 3.10 cm   IVC diam: 1.90 cm  RV S prime:   12.30 cm/s  TAPSE (M-mode): 1.4 cm   LEFT ATRIUM       Index    RIGHT ATRIUM      Index  LA diam:    4.90 cm 3.12 cm/m RA Area:   11.40 cm  LA Vol (A2C):  57.2 ml 36.38 ml/m RA Volume:  25.00 ml 15.90 ml/m  LA Vol (A4C):  56.4 ml 35.87 ml/m  LA Biplane Vol: 58.3 ml 37.08 ml/m  AORTIC VALVE          PULMONIC VALVE  AV Area (Vmax):  0.86 cm   PV Vmax:    0.91 m/s  AV Area (Vmean):  0.68 cm   PV Peak grad: 3.3 mmHg  AV Area (VTI):   1.18 cm  AV Vmax:      347.00 cm/s  AV Vmean:     240.000 cm/s  AV VTI:      0.504 m  AV Peak Grad:   48.2 mmHg  AV Mean Grad:   26.0 mmHg  LVOT Vmax:     94.60 cm/s  LVOT Vmean:    51.800 cm/s  LVOT VTI:     0.189 m  LVOT/AV VTI ratio: 0.38    AORTA  Ao Root diam: 3.40 cm  Ao Asc diam: 3.20 cm  Ao Arch diam: 2.8 cm   MITRAL VALVE        TRICUSPID VALVE  MV Area (PHT): 2.33 cm  TR Peak grad:  23.8 mmHg  MV Decel Time: 325 msec  TR Vmax:    244.00 cm/s  MV E velocity: 58.30 cm/s  MV A velocity: 95.50 cm/s SHUNTS  MV E/A ratio: 0.61    Systemic VTI: 0.19 m               Systemic Diam: 2.00 cm     Cardiac cath 05/16/20:  Prox LAD lesion is 40% stenosed.  Mid LAD lesion is 60% stenosed.  Ost 2nd Diag to 2nd Diag lesion is 70% stenosed.  Ost Cx to Prox  Cx lesion is 40% stenosed.  Non-stenotic Mid Cx lesion was previously treated.  Prox RCA to Mid RCA lesion is 70% stenosed.  Mid RCA to Dist RCA lesion is 100% stenosed.   1.  No significant change in coronary anatomy since 2019.  Chronically occluded distal right coronary artery with left-to-right collaterals, patent left circumflex stents with minimal restenosis, moderate LAD disease.  The coronary arteries are heavily calcified. 2.  Left ventricular angiography was not performed.  EF was normal by echo. 3.  Right heart catheterization showed low filling pressures, normal pulmonary pressure and normal cardiac output. 4.  Severe aortic stenosis with a mean gradient of 30 mmHg and valve area of 0.8 cm.  Diagnostic Dominance: Right Left Anterior Descending  Prox LAD lesion is 40% stenosed. The lesion is severely calcified.  Mid LAD lesion is 60% stenosed.  Second Diagonal [employer] 2nd Diag to 2nd Diag lesion is 70% stenosed.  Left Circumflex  Ost Cx to Prox Cx lesion is 40% stenosed. The lesion is calcified.  Non-stenotic Mid Cx lesion was previously treated. The lesion is type C and located at the major branch. The lesion is calcified.  Right Coronary Artery  Prox RCA to Mid RCA lesion is 70% stenosed. The lesion is severely calcified.  Mid RCA to Dist RCA lesion is 100% stenosed.  Right Posterior Atrioventricular Artery  Collaterals  RPAV filled by collaterals from Acute Mrg.    Second Right Posterolateral Branch  Collaterals  2nd RPL filled by collaterals from 3rd Sept.    Intervention  No interventions have been documented. Coronary Diagrams  Diagnostic Dominance: Right  Intervention  Implants   No implant documentation for this case.  Syngo Images  Show images for CARDIAC  CATHETERIZATION Images on Long Term Storage  Show images for [place], [place] to Procedure Log  Procedure Log    Hemo Data   Most Recent Value  Fick Cardiac Output 4.1 L/min  Fick Cardiac Output Index 2.63 (L/min)/BSA  Aortic Mean Gradient 30.1 mmHg  Aortic Peak Gradient 29 mmHg  Aortic Valve Area 0.80  Aortic Value Area Index 0.51 cm2/BSA  RA A Wave 3 mmHg  RA V Wave 2 mmHg  RA Mean 1 mmHg  RV Systolic Pressure 20 mmHg  RV Diastolic Pressure -1 mmHg  RV EDP 0 mmHg  PA Systolic Pressure 21 mmHg  PA Diastolic Pressure 0 mmHg  PA Mean 13 mmHg  PW A Wave 6 mmHg  PW V Wave 5 mmHg  PW Mean 2 mmHg  AO Systolic Pressure 767 mmHg  AO Diastolic Pressure 61 mmHg  AO Mean 88 mmHg  LV Systolic Pressure 209 mmHg  LV Diastolic Pressure 2 mmHg  LV EDP 7 mmHg  AOp Systolic Pressure 470 mmHg  AOp Diastolic Pressure 67 mmHg  AOp Mean Pressure 99 mmHg  LVp Systolic Pressure 962 mmHg  LVp Diastolic Pressure 2 mmHg  LVp EDP Pressure 6 mmHg  QP/QS 1  TPVR Index 4.94 HRUI  TSVR Index 33.44 HRUI  PVR SVR Ratio 0.13  TPVR/TSVR Ratio 0.15   Recent Labs: 11/07/2019: ALT 23 11/17/2019: Magnesium 1.5 06/05/2020: BUN 14; Creatinine, Ser 0.76; Hemoglobin 12.4; Platelets 222; Potassium 3.4; Sodium 134 06/06/2020: TSH 2.509     Wt Readings from Last 3 Encounters:  06/11/20 133 lb (60.3 kg)  06/05/20 134 lb 14.7 oz (61.2 kg)  05/30/20 135 lb (61.2 kg)     Other studies Reviewed: Additional studies/ records that were reviewed today include: echo  images, cath images, office notes Review of the above records demonstrates: severe AS   Assessment and Plan:   1. Severe Aortic Valve Stenosis: She has severe, stage D aortic valve stenosis. I have personally reviewed the echo images. The aortic valve is thickened, calcified with limited leaflet mobility. I think she would benefit from AVR. Given advanced age, she is not a good candidate for conventional AVR by surgical approach. I think  she may be a good candidate for TAVR.   STS Risk Score: Risk of Mortality: 4.320% Renal Failure: 2.148% Permanent Stroke: 3.901% Prolonged Ventilation: 10.259% DSW Infection: 0.072% Reoperation: 5.202% Morbidity or Mortality: 15.681% Short Length of Stay: 26.744% Long Length of Stay: 5.127%  I have reviewed the natural history of aortic stenosis with the patient and their family members  who are present today. We have discussed the limitations of medical therapy and the poor prognosis associated with symptomatic aortic stenosis. We have reviewed potential treatment options, including palliative medical therapy, conventional surgical aortic valve replacement, and transcatheter aortic valve replacement. We discussed treatment options in the context of the patient's specific comorbid medical conditions.   She would like to proceed with planning for TAVR. Risks and benefits of the valve procedure are reviewed with the patient. Will plan a cardiac CT, CTA of the chest/abdomen and pelvis and PT assessment. She will then be referred to see one of the CT surgeons on our TAVR team.      Current medicines are reviewed at length with the patient today.  The patient does not have concerns regarding medicines.  The following changes have been made:  no change  Labs/ tests ordered today include:   Orders Placed This Encounter  Procedures   CT CORONARY MORPH W/CTA COR W/SCORE W/CA W/CM &/OR WO/CM   CT Angio Abd/Pel w/ and/or w/o   CT ANGIO CHEST AORTA W/CM & OR WO/CM     Disposition:   F/U with the valve team.    Signed, Lauree Chandler, MD 06/11/2020 12:05 PM    [address], [place], [place]  [address] Phone: [phone]; Fax: [phone]

## 2020-06-12 LAB — 25-HYDROXY VITAMIN D LCMS D2+D3
25-Hydroxy, Vitamin D-2: <1 ng/mL
25-Hydroxy, Vitamin D-3: 37 ng/mL
25-Hydroxy, Vitamin D: 37 ng/mL

## 2020-06-13 ENCOUNTER — Ambulatory Visit (INDEPENDENT_AMBULATORY_CARE_PROVIDER_SITE_OTHER): Payer: Medicare Other | Admitting: Family Medicine

## 2020-06-13 ENCOUNTER — Other Ambulatory Visit: Payer: Self-pay

## 2020-06-13 ENCOUNTER — Encounter: Payer: Self-pay | Admitting: Family Medicine

## 2020-06-13 DIAGNOSIS — F419 Anxiety disorder, unspecified: Secondary | ICD-10-CM | POA: Diagnosis not present

## 2020-06-13 DIAGNOSIS — I350 Nonrheumatic aortic (valve) stenosis: Secondary | ICD-10-CM | POA: Diagnosis not present

## 2020-06-13 DIAGNOSIS — F32A Depression, unspecified: Secondary | ICD-10-CM | POA: Diagnosis not present

## 2020-06-13 DIAGNOSIS — I200 Unstable angina: Secondary | ICD-10-CM

## 2020-06-13 DIAGNOSIS — Z8673 Personal history of transient ischemic attack (TIA), and cerebral infarction without residual deficits: Secondary | ICD-10-CM | POA: Diagnosis not present

## 2020-06-13 NOTE — Assessment & Plan Note (Signed)
She will proceed with work-up of aortic valve stenosis with plan for TAVR per cardiology.

## 2020-06-13 NOTE — Assessment & Plan Note (Addendum)
Depression continues to be an issue.  She just recently increased the dose of Remeron.  Given difficulty getting this under control we will go ahead and place a psychiatry referral.  Discussed she could cancel this if her depression comes under better control with the increased dose of Remeron.  Discussed it could be several weeks before she notices a difference with the increased medication dosage.  She will continue mirtazapine 45 mg once daily.

## 2020-06-13 NOTE — Progress Notes (Signed)
[name], MD Phone: [phone]  Amy Morton is a 85 y.o. female who presents today for f/u.  Stroke: Patient developed left arm numbness while she was getting her nails done.  It resolved relatively quickly.  She was evaluated in the emergency department and admitted to the hospital.  She was found to have acute strokes on MRI of the brain in the right parietal area.  She was already on dual antiplatelet therapy and had a well-controlled LDL on atorvastatin.  Neurology recommended extended cardiac monitoring.  She has a zio monitor in place.  She does have follow-up with cardiology scheduled.  Aortic stenosis: Patient has been evaluated by cardiology for possible TAVR.  She is in the process of getting this worked up and scheduled.  She does note fatigue and shortness of breath which have been ongoing.  She notes cardiology mentioned allowing her blood pressure to run a little higher until she has the valve replaced.  Depression: Continues to have depressive symptoms.  We recently increased her Remeron 45 mg once daily.  She notes this does help her sleep well.  No drowsiness.  No SI.  Social History   Tobacco Use  Smoking Status Never Smoker  Smokeless Tobacco Never Used     ROS see history of present illness  Objective  Physical Exam Vitals:   06/13/20 1145  BP: 140/70  Pulse: 78  Temp: 98 F (36.7 C)  SpO2: 98%    BP Readings from Last 3 Encounters:  06/13/20 140/70  06/11/20 (!) 150/90  06/06/20 139/73   Wt Readings from Last 3 Encounters:  06/13/20 132 lb 9.6 oz (60.1 kg)  06/11/20 133 lb (60.3 kg)  06/05/20 134 lb 14.7 oz (61.2 kg)    Physical Exam Constitutional:      General: She is not in acute distress.    Appearance: She is not diaphoretic.  Cardiovascular:     Rate and Rhythm: Normal rate and regular rhythm.     Heart sounds: Murmur (3/6 systolic ejection murmur right upper sternal border) heard.   Pulmonary:     Effort: Pulmonary  effort is normal.     Breath sounds: Normal breath sounds.  Skin:    General: Skin is warm and dry.  Neurological:     Mental Status: She is alert.     Comments: EOMI, PERRL, V1 through V3 light touch sensation intact bilaterally, shoulder shrug intact, opens and closes eyes adequately, 5/5 strength in bilateral biceps, triceps, grip, quads, hamstrings, plantar and dorsiflexion, sensation to light touch intact in bilateral UE and LE      Assessment/Plan: Please see individual problem list.  Problem List Items Addressed This Visit    Anxiety and depression    Depression continues to be an issue.  She just recently increased the dose of Remeron.  Given difficulty getting this under control we will go ahead and place a psychiatry referral.  Discussed she could cancel this if her depression comes under better control with the increased dose of Remeron.  Discussed it could be several weeks before she notices a difference with the increased medication dosage.  She will continue mirtazapine 45 mg once daily.      Relevant Orders   Ambulatory referral to Psychiatry   Aortic valve stenosis    She will proceed with work-up of aortic valve stenosis with plan for TAVR per cardiology.      History of CVA (cerebrovascular accident)    No residual symptoms.  Risk factors  are managed adequately at this time.  She will complete the monitor and follow-up with cardiology.  We will continue management of her cholesterol and blood pressure.          This visit occurred during the SARS-CoV-2 public health emergency.  Safety protocols were in place, including screening questions prior to the visit, additional usage of staff PPE, and extensive cleaning of exam room while observing appropriate contact time as indicated for disinfecting solutions.    [name], MD [place]

## 2020-06-13 NOTE — Assessment & Plan Note (Signed)
No residual symptoms.  Risk factors are managed adequately at this time.  She will complete the monitor and follow-up with cardiology.  We will continue management of her cholesterol and blood pressure.

## 2020-06-13 NOTE — Patient Instructions (Signed)
Nice to see you. I am going to refer you to a psychiatrist.  If your depression starts to improve on the higher dose of the Remeron you can cancel the appointment though otherwise I would like for you to see them. Please continue to follow with cardiology. If you have any recurrent stroke symptoms such as numbness, weakness, slurred speech, facial droop, or other stroke symptoms please seek medical attention immediately.

## 2020-06-15 ENCOUNTER — Encounter (HOSPITAL_COMMUNITY): Payer: Self-pay

## 2020-06-15 ENCOUNTER — Ambulatory Visit (HOSPITAL_COMMUNITY)
Admission: RE | Admit: 2020-06-15 | Discharge: 2020-06-15 | Disposition: A | Discharge status: Home / Self Care | Payer: Medicare Other | Source: Ambulatory Visit | Attending: Cardiovascular Disease | Admitting: Cardiovascular Disease

## 2020-06-15 ENCOUNTER — Telehealth: Payer: Self-pay | Admitting: Cardiovascular Disease

## 2020-06-15 ENCOUNTER — Other Ambulatory Visit: Payer: Self-pay

## 2020-06-15 DIAGNOSIS — I350 Nonrheumatic aortic (valve) stenosis: Secondary | ICD-10-CM | POA: Insufficient documentation

## 2020-06-15 MED ORDER — METOPROLOL TARTRATE 5 MG/5ML IV SOLN
INTRAVENOUS | Status: AC
  Filled 2020-06-15: qty 5

## 2020-06-15 MED ORDER — IOHEXOL 350 MG/ML SOLN
100.0000 mL | Freq: Once | INTRAVENOUS | Status: AC | PRN
  Administered 2020-06-15: 100 mL via INTRAVENOUS

## 2020-06-15 MED ORDER — METOPROLOL TARTRATE 5 MG/5ML IV SOLN
5.0000 mg | INTRAVENOUS | Status: DC | PRN
  Administered 2020-06-15: 5 mg via INTRAVENOUS

## 2020-06-15 NOTE — Telephone Encounter (Signed)
Okay to send another ZIO AT if needed.

## 2020-06-15 NOTE — Telephone Encounter (Signed)
This message was forwarded to me from [place] as I placed a ZIO AT on the patient in the hospital on 06/06/20. This was ordered by Christell Faith, PA due to stroke.  She is a patient of Dr. Tyrell Antonio. She was recently seen by Dr. Angelena Form on 06/11/20 for consideration of TAVR.   She went in for a CTA of the chest and Cardiac CT today (pre TAVR).  Will forward to the patient's providers to review- if he ZIO will not stay on, will have them review to see if they would like her to just mail this back or have another monitor mailed to her.  The CT scans were not ordered prior to her monitor placement.

## 2020-06-15 NOTE — Telephone Encounter (Signed)
Shawna from [facility] calling to inform they have to remove the patient's zio monitor for her CT today. She states there is a chance it may not stick anymore.

## 2020-06-18 NOTE — Telephone Encounter (Signed)
DPR on file. Spoke with the patients sister [name]. They were able to reapply the zio monitor after the patients 06/15/20 CT. The patient is still wearing the monitor and will complete as planned on Wed 06/20/20 to total the 14 days. They will then mail the monitor back. Valerie Roys that we will be in contact with the results when available.  No action required. [name] voiced appreciation for the call.

## 2020-06-25 ENCOUNTER — Institutional Professional Consult (permissible substitution) (INDEPENDENT_AMBULATORY_CARE_PROVIDER_SITE_OTHER): Payer: Medicare Other | Admitting: Thoracic Surgery (Cardiothoracic Vascular Surgery)

## 2020-06-25 ENCOUNTER — Encounter: Payer: Self-pay | Admitting: Thoracic Surgery (Cardiothoracic Vascular Surgery)

## 2020-06-25 ENCOUNTER — Other Ambulatory Visit: Payer: Self-pay

## 2020-06-25 VITALS — BP 159/95 | HR 80 | Resp 18 | Ht 59.0 in | Wt 131.0 lb

## 2020-06-25 DIAGNOSIS — I200 Unstable angina: Secondary | ICD-10-CM | POA: Diagnosis not present

## 2020-06-25 DIAGNOSIS — I350 Nonrheumatic aortic (valve) stenosis: Secondary | ICD-10-CM | POA: Diagnosis not present

## 2020-06-25 NOTE — Patient Instructions (Addendum)
Continue all previous medications without any changes at this time  Check your blood pressure on a regular basis and keep a log for your records.  Discussed with your cardiologist and/or primary care physician whether or not your blood pressure medications should be adjusted.  

## 2020-06-25 NOTE — Progress Notes (Signed)
[facility]  CARDIOTHORACIC SURGERY CONSULTATION REPORT  Referring Provider is Lauree Chandler, MD Primary Cardiologist is Kathlyn Sacramento, MD PCP is Caryl Bis Angela Adam, MD  Chief Complaint  Patient presents with  . Aortic Stenosis    Initial surgical consult, TAVR consult    HPI:  Patient is an 85 year old female with history of hypertension, coronary artery disease, aortic stenosis, recent embolic stroke, anxiety, depression, significant memory loss, and anemia who has been referred for surgical consultation to discuss treatment options for management of aortic stenosis.  Patient has long history of coronary artery disease and difficult to control hypertension.  She has been followed by Dr. Fletcher Anon for several years and underwent PCI of the left circumflex in 2016.  The right coronary artery was notably chronically occluded at that time.  2019 she was hospitalized with unstable angina and found to have discrete 70% stenosis in the LAD.  Echocardiogram revealed normal left ventricular function with moderate aortic stenosis.  Catheterization revealed stable coronary artery disease with moderate aortic stenosis.  Most recent echocardiogram performed March 06, 2020 revealed normal left ventricular systolic function with ejection fraction estimated greater than 75%.  There was severe left ventricular hypertrophy with significant diastolic dysfunction and elevated left atrial pressure.  The aortic valve revealed moderate aortic stenosis and moderate to severe aortic regurgitation.  Peak velocity across the aortic valve range between 2.7 and 3.5 m/s corresponding to mean transvalvular gradient estimated as high as 26 mmHg and aortic valve area calculated 1.18 cm by VTI.  The DVI was reported 0.38 and stroke-volume index 38.  Continued medical therapy was recommended.  The patient was seen in follow-up by Dr. Fletcher Anon May 10, 2020.  At that time  the patient reported progressive symptoms of exertional chest tightness associated with shortness of breath.  She subsequently underwent diagnostic cardiac catheterization May 16, 2020.  Catheterization revealed stable coronary anatomy with chronic occlusion of the distal right coronary artery, patent stents in the left circumflex with minimal restenosis, and moderate 60% stenosis of the mid left anterior descending coronary artery.  Peak to peak and mean transvalvular gradients across the aortic valve were measured 29 and 38 mmHg respectively, corresponding to aortic valve area calculated 0.80 cm.  Cardiac output and right heart pressures were normal.  The patient was subsequently referred to the multidisciplinary heart valve clinic but prior to her evaluation she was hospitalized June 05, 2020 with a transient ischemic attack.  At the time the patient was on dual antiplatelet therapy using aspirin and Plavix.  MRI of the brain revealed multiple tiny right parietal acute and subacute infarcts as well as chronic left thalamic lacunar infarct, mild cerebral atrophy, and chronic microvascular ischemic change.  MRA revealed moderate to severe distal left ICA cervical segment narrowing.  Carotid duplex scan revealed no significant extracranial cerebrovascular disease.  The patient had ambulatory telemetry monitor placed for 2 weeks, results remain pending at this time.  Patient was seen in consultation by Dr. Angelena Form and subsequently referred for surgical consultation.  The patient is widowed and lives alone in [place].  She is accompanied by her grandson for her office consultation visit today.  She has a daughter-in-law named [name] who often accompanies her for other healthcare related appointments.  The patient remains functionally independent.  However, she complains that over the past year she has had increasing problems with memory loss.  She still has an automobile but does not drive because she  gets  lost to easily.  She sometimes drives short distance to the grocery store.  At the time of her office visit today she denies any symptoms of exertional shortness of breath or chest discomfort.  Her biggest complaint is that of memory loss.  She reports normal energy and good appetite.  She specifically denies any exertional shortness of breath.  She states that she sometimes gets dizzy when she stands up from a seated position.  She has not had any syncopal episodes.  She recalls the symptoms related to her recent TIA including particular drawing along the left side of her face which she states resolved within 30 to 60 minutes.  She ambulates without need for cane or other mechanical support.  Past Medical History:  Diagnosis Date  . Abnormal mammogram 2007   Recommend close follow up  . Anemia    Iron, b12, SPEP, UPEP normal, 12/2012  . Anxiety   . Aortic stenosis    Severe by [facility] 05/2020  . aortic stenosis   . Arthritis   . Asthma   . CAD (coronary artery disease)    s/p angioplasty 1996  . Chronic cough   . Depression   . Diverticulosis   . Dyspnea   . Elevated LFTs 2010   s/p ultrasound w/ possible fatty liver; GI consult. Resolution 2011  . Gastric ulcer   . GERD (gastroesophageal reflux disease)   . History of colonic polyps    Dr. Vira Agar  . HOH (hard of hearing)   . Hyperlipidemia   . Hypertension   . Peritoneal carcinomatosis [facility]) 2001   Dr. Oliva Bustard & Dr. Claiborne Rigg s/p chemo  . Shingles 2008   T-9 distribution  . Valvular heart disease    Mild to Moderate MR, AS  . Vitamin D deficiency   . Wheezing     Past Surgical History:  Procedure Laterality Date  . ABDOMINAL HYSTERECTOMY  2001  . APPENDECTOMY    . CARDIAC CATHETERIZATION  2007   50% mid LAD stenosis, 70% proximal RCA with 100% distal RCA stenosis with collaterals, EF 65%  . CARDIAC CATHETERIZATION N/A 04/27/2015   Procedure: Left Heart Cath and Coronary Angiography;  Surgeon: Jettie Booze,  MD;  Location: [facility];  Service: Cardiovascular;  Laterality: N/A;  . CARDIAC CATHETERIZATION N/A 04/27/2015   Procedure: Coronary Stent Intervention;  Surgeon: Jettie Booze, MD;  Location: [place] [facility];  Service: Cardiovascular;  Laterality: N/A;  . CATARACT EXTRACTION W/PHACO Left 09/25/2016   Procedure: CATARACT EXTRACTION PHACO AND INTRAOCULAR LENS PLACEMENT (I[facility]);  Surgeon: Eulogio Bear, MD;  Location: [facility];  Service: Ophthalmology;  Laterality: Left;  [place] 46.7 AP% 8.1 CDE 3.80 Fluid pack lot # [id number]  . CATARACT EXTRACTION W/PHACO Right 11/06/2016   Procedure: CATARACT EXTRACTION PHACO AND INTRAOCULAR LENS PLACEMENT ([place]);  Surgeon: Eulogio Bear, MD;  Location: [facility];  Service: Ophthalmology;  Laterality: Right;  [place] 49.5 AP8.7 CDE4.28 LOT # [id number]  . CHOLECYSTECTOMY  1987  . COLECTOMY  2001  . CORONARY ANGIOPLASTY  1996  . CORONARY ANGIOPLASTY    . LAPAROSCOPIC BILATERAL SALPINGO OOPHERECTOMY  1988   Dr. Laverta Baltimore  . LEFT HEART CATH AND CORONARY ANGIOGRAPHY N/A 01/25/2018   Procedure: LEFT HEART CATH AND CORONARY ANGIOGRAPHY;  Surgeon: Wellington Hampshire, MD;  Location: [place] [facility];  Service: Cardiovascular;  Laterality: N/A;  . RIGHT/LEFT HEART CATH AND CORONARY ANGIOGRAPHY N/A 05/16/2020   Procedure: RIGHT/LEFT HEART CATH AND CORONARY ANGIOGRAPHY;  Surgeon: Wellington Hampshire, MD;  Location: [place] [facility];  Service: Cardiovascular;  Laterality: N/A;  . TONSILLECTOMY  1950    Family History  Problem Relation Age of Onset  . Hypertension Mother   . Stroke Mother 29       Cerebral Hemorrhage  . Cancer Father        bone cancer  . Diabetes Sister   . Diabetes Brother   . Heart disease Brother   . Cancer Daughter 64       Ovarian cancer  . Colon cancer Neg Hx   . Colon polyps Neg Hx     Social History   Socioeconomic History  . Marital status: Widowed    Spouse name: Not on file  . Number of children: 3  . Years of  education: Not on file  . Highest education level: Not on file  Occupational History  . Occupation: [employer]    Comment: Retired  . 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 Smoking status: Never Smoker  . Smokeless tobacco: Never Used  Vaping Use  . Vaping Use: Never used  Substance and Sexual Activity  . Alcohol use: No    Alcohol/week: 0.0 standard drinks  . Drug use: No  . Sexual activity: Never  Other Topics Concern  . Not on file  Social History Narrative   Pt lives in [place] by herself. She is widowed and has a daughter [name]), son [name]). She also had a son that was killed in a work related accident [name] - died age).       Caffeine - none   Exercise - walking   Social Determinants of Health   Financial Resource Strain: Low Risk   . Difficulty of Paying Living Expenses: Not hard at all  Food Insecurity: No Food Insecurity  . Worried About Charity fundraiser in the Last Year: Never true  . Ran Out of Food in the Last Year: Never true  Transportation Needs: No Transportation Needs  . Lack of Transportation (Medical): No  . Lack of Transportation (Non-Medical): No  Physical Activity:   . Days of Exercise per Week: Not on file  . Minutes of Exercise per Session: Not on file  Stress: No Stress Concern Present  . Feeling of Stress : Not at all  Social Connections:   . Frequency of Communication with Friends and Family: Not on file  . Frequency of Social Gatherings with Friends and Family: Not on file  . Attends Religious Services: Not on file  . Active Member of Clubs or Organizations: Not on file  . Attends Archivist Meetings: Not on file  . Marital Status: Not on file  Intimate Partner Violence:   . Fear of Current or Ex-Partner: Not on file  . Emotionally Abused: Not on file  . Physically Abused: Not on file  . Sexually Abused: Not on file    Current Outpatient Medications  Medication Sig Dispense  Refill  . acetaminophen (TYLENOL) 500 MG tablet Take 1,000 mg by mouth in the morning and at bedtime.    Marland Kitchen albuterol (VENTOLIN HFA) 108 (90 Base) MCG/ACT inhaler Inhale 2 puffs into the lungs every 4 (four) hours as needed for wheezing or shortness of breath. 18 g 0  . aspirin 81 MG tablet Take 81 mg by mouth daily.    Marland Kitchen atorvastatin (LIPITOR) 80 MG tablet TAKE 1/2 TABLET(40 MG) BY MOUTH DAILY AT 6  PM 45 tablet 1  . carboxymethylcellul-glycerin (REFRESH RELIEVA) 0.5-0.9 % ophthalmic solution Place 1 drop into both eyes daily as needed for dry eyes.    . Cholecalciferol 5000 units TABS Take 5,000 Units by mouth daily.    . clopidogrel (PLAVIX) 75 MG tablet TAKE 1 TABLET BY MOUTH DAILY WITH BREAKFAST 90 tablet 1  . isosorbide mononitrate (IMDUR) 30 MG 24 hr tablet TAKE 1 TABLET(30 MG) BY MOUTH DAILY 90 tablet 2  . loperamide (IMODIUM A-D) 2 MG tablet Take 2 mg by mouth 4 (four) times daily as needed for diarrhea or loose stools.    . metoprolol tartrate (LOPRESSOR) 25 MG tablet Take 1 tablet (25 mg total) by mouth 2 (two) times daily. 60 tablet 0  . mirtazapine (REMERON) 45 MG tablet TAKE 1 TABLET BY MOUTH EVERY NIGHT AT BEDTIME 30 tablet 3  . Multiple Vitamin (MULTIVITAMIN) capsule Take 1 capsule by mouth daily.    . nitroGLYCERIN (NITROSTAT) 0.4 MG SL tablet PLACE 1 TABLET UNDER THE TONGUE EVERY 5 MINUTES AS NEEDED FOR CHEST PAIN, 3 DOSES MAX 25 tablet 1  . Nutritional Supplements (OSTEO ADVANCE) TABS Take 1 tablet by mouth daily.     Marland Kitchen omega-3 acid ethyl esters (LOVAZA) 1 g capsule TAKE 2 CAPSULES BY MOUTH TWICE DAILY 360 capsule 1  . pantoprazole (PROTONIX) 40 MG tablet TAKE 1 TABLET(40 MG) BY MOUTH DAILY 90 tablet 2  . Ubiquinol (QUNOL COQ10/UBIQUINOL/MEGA) 100 MG CAPS Take 100 mg by mouth every morning.    . vitamin B-12 (CYANOCOBALAMIN) 250 MCG tablet Take 250 mcg by mouth daily.     No current facility-administered medications for this visit.   Facility-Administered Medications Ordered in  Other Visits  Medication Dose Route Frequency Provider Last Rate Last Admin  . sodium chloride 0.9 % nebulizer solution 3 mL  3 mL Nebulization Once [name], MD       Followed by  . methacholine (PROVOCHOLINE) inhaler solution 0.125 mg  2 mL Inhalation Once [name], MD       Followed by  . methacholine (PROVOCHOLINE) inhaler solution 0.5 mg  2 mL Inhalation Once [name], MD       Followed by  . methacholine (PROVOCHOLINE) inhaler solution 2 mg  2 mL Inhalation Once [name], MD       Followed by  . methacholine (PROVOCHOLINE) inhaler solution 8 mg  2 mL Inhalation Once [name], MD       Followed by  . methacholine (PROVOCHOLINE) inhaler solution 32 mg  2 mL Inhalation Once [name], MD        Allergies  Allergen Reactions  . Abilify [Aripiprazole] Other (See Comments)    Burning in Chest Elevated b/p Insomnia  . Celexa [Citalopram]     Caused low sodium level.       Review of Systems:   General:  normal appetite, normal energy, no weight gain, no weight loss, no fever  Cardiac:  no chest pain with exertion, no chest pain at rest, no SOB with exertion, no resting SOB, no PND, no orthopnea, no palpitations, no arrhythmia, no atrial fibrillation, no LE edema, mild dizzy spells, no syncope  Respiratory:  no shortness of breath, no home oxygen, no productive cough, no dry cough, no bronchitis, no wheezing, no hemoptysis, no asthma, no pain with inspiration or cough, no sleep apnea, no CPAP at night  GI:   no difficulty swallowing, no reflux, no frequent heartburn, no  hiatal hernia, no abdominal pain, no constipation, no diarrhea, no hematochezia, no hematemesis, no melena  GU:   no dysuria,  no frequency, no urinary tract infection, no hematuria, no kidney stones, no kidney disease  Vascular:  no pain suggestive of claudication, no pain in feet, no leg cramps, no varicose veins, no DVT, no non-healing foot ulcer  Neuro:   +  recent stroke, + TIA's, no seizures, no headaches, no temporary blindness one eye,  no slurred speech, no peripheral neuropathy, no chronic pain, mild instability of gait, + significant memory/cognitive dysfunction  Musculoskeletal: + arthritis, no joint swelling, no myalgias, no difficulty walking, normal mobility   Skin:   no rash, no itching, no skin infections, no pressure sores or ulcerations  Psych:   + anxiety, + depression, no nervousness, no unusual recent stress  Eyes:   no blurry vision, no floaters, no recent vision changes, + wears glasses or contacts  ENT:   mild hearing loss, no loose or painful teeth, full dentures  Hematologic:  + easy bruising, no abnormal bleeding, no clotting disorder, no frequent epistaxis  Endocrine:  no diabetes, does not check CBG's at home           Physical Exam:   BP (!) 159/95 (BP Location: Right Arm, Patient Position: Sitting)   Pulse 80   Resp 18   Ht 4\' 11"  (1.499 m)   Wt 131 lb (59.4 kg)   SpO2 96% Comment: RA with mask on  BMI 26.46 kg/m   General:  Elderly but  well-appearing  HEENT:  Unremarkable   Neck:   no JVD, no bruits, no adenopathy   Chest:   clear to auscultation, symmetrical breath sounds, no wheezes, no rhonchi   CV:   RRR, grade III/VI crescendo/decrescendo murmur heard best at RSB,  no diastolic murmur  Abdomen:  soft, non-tender, no masses   Extremities:  warm, well-perfused, pulses diminished, no LE edema  Rectal/GU  Deferred  Neuro:   Grossly non-focal and symmetrical throughout  Skin:   Clean and dry, no rashes, no breakdown   Diagnostic Tests:   ECHOCARDIOGRAM REPORT       Patient Name:  CHANTELLE VERDI Date of Exam: 03/06/2020  Medical Rec #: [id number]     Height:    59.0 in  Accession #:  [id number]     Weight:    137.4 lb  Date of Birth: February 26, 1935     BSA:     1.572 m  Patient Age:  32 years      BP:      130/78 mmHg  Patient Gender: F          HR:      76 bpm.  Exam Location: [facility]   Procedure: 2D Echo, Cardiac Doppler, Color Doppler and Intracardiac       Opacification Agent   Indications:  I35.0 Nonrheumatic aortic (valve) stenosis    History:    Patient has prior history of Echocardiogram examinations,  most         recent 03/16/2019. CAD, COPD,         Signs/Symptoms:Dizziness/Lightheadedness; Risk         Factors:Hypertension, Dyslipidemia and Non-Smoker.    Sonographer:  Pilar Jarvis RDMS, RVT, RDCS  Referring Phys: [id number] A [facility]     Sonographer Comments: Limited acoustic windows and COPD  IMPRESSIONS    1. Left ventricular ejection fraction, by estimation, is >75%. The left  ventricle has hyperdynamic function. Left ventricular  endocardial border  not optimally defined to evaluate regional wall motion. There is severe  left ventricular hypertrophy. Left  ventricular diastolic parameters are consistent with Grade I diastolic  dysfunction (impaired relaxation). Elevated left atrial pressure.  2. Right ventricular systolic function is low normal. The right  ventricular size is normal. There is normal pulmonary artery systolic  pressure.  3. Left atrial size was mildly dilated.  4. The mitral valve is degenerative. Moderate mitral valve regurgitation.  5. The aortic valve has an indeterminant number of cusps. Aortic valve  regurgitation is moderate to severe. Moderate aortic valve stenosis.  Aortic valve area, by VTI measures 1.18 cm. Aortic valve mean gradient  measures 26.0 mmHg. Aortic valve Vmax  measures 3.47 m/s.  6. The inferior vena cava is normal in size with greater than 50%  respiratory variability, suggesting right atrial pressure of 3 mmHg.   FINDINGS  Left Ventricle: Left ventricular ejection fraction, by estimation, is  >75%. The left ventricle has hyperdynamic function. Left ventricular  endocardial border not optimally  defined to evaluate regional wall motion.  Definity contrast agent was given IV to  delineate the left ventricular endocardial borders. The left ventricular  internal cavity size was normal in size. There is severe left ventricular  hypertrophy. Left ventricular diastolic parameters are consistent with  Grade I diastolic dysfunction  (impaired relaxation). Elevated left atrial pressure.   Right Ventricle: The right ventricular size is normal. No increase in  right ventricular wall thickness. Right ventricular systolic function is  low normal. There is normal pulmonary artery systolic pressure. The  tricuspid regurgitant velocity is 2.44 m/s,  and with an assumed right atrial pressure of 3 mmHg, the estimated right  ventricular systolic pressure is 49.7 mmHg.   Left Atrium: Left atrial size was mildly dilated.   Right Atrium: Right atrial size was normal in size.   Pericardium: There is no evidence of pericardial effusion. Presence of  pericardial fat pad.   Mitral Valve: The mitral valve is degenerative in appearance. Moderate  mitral annular calcification. Moderate mitral valve regurgitation.   Tricuspid Valve: The tricuspid valve is normal in structure. Tricuspid  valve regurgitation is trivial.   Aortic Valve: The aortic valve has an indeterminant number of cusps. .  There is moderate thickening and mild calcification of the aortic valve.  Aortic valve regurgitation is moderate to severe. Moderate aortic stenosis  is present. There is moderate  thickening of the aortic valve. There is mild calcification of the aortic  valve. Aortic valve mean gradient measures 26.0 mmHg. Aortic valve peak  gradient measures 48.2 mmHg. Aortic valve area, by VTI measures 1.18 cm.   Pulmonic Valve: The pulmonic valve was not well visualized. Pulmonic valve  regurgitation is not visualized. No evidence of pulmonic stenosis.   Aorta: The aortic root and ascending aorta are structurally normal,  with  no evidence of dilitation.   Pulmonary Artery: The pulmonary artery is not well seen.   Venous: The inferior vena cava is normal in size with greater than 50%  respiratory variability, suggesting right atrial pressure of 3 mmHg.   IAS/Shunts: The interatrial septum was not well visualized.     LEFT VENTRICLE  PLAX 2D  LVIDd:     2.90 cm Diastology  LV PW:     1.50 cm LV e' lateral:  4.03 cm/s  LV IVS:    2.20 cm LV E/e' lateral: 14.5  LVOT diam:   2.00 cm LV e' medial:  3.59  cm/s  LV SV:     59    LV E/e' medial: 16.2  LV SV Index:  38  LVOT Area:   3.14 cm     RIGHT VENTRICLE       IVC  RV Basal diam: 3.10 cm   IVC diam: 1.90 cm  RV S prime:   12.30 cm/s  TAPSE (M-mode): 1.4 cm   LEFT ATRIUM       Index    RIGHT ATRIUM      Index  LA diam:    4.90 cm 3.12 cm/m RA Area:   11.40 cm  LA Vol (A2C):  57.2 ml 36.38 ml/m RA Volume:  25.00 ml 15.90 ml/m  LA Vol (A4C):  56.4 ml 35.87 ml/m  LA Biplane Vol: 58.3 ml 37.08 ml/m  AORTIC VALVE          PULMONIC VALVE  AV Area (Vmax):  0.86 cm   PV Vmax:    0.91 m/s  AV Area (Vmean):  0.68 cm   PV Peak grad: 3.3 mmHg  AV Area (VTI):   1.18 cm  AV Vmax:      347.00 cm/s  AV Vmean:     240.000 cm/s  AV VTI:      0.504 m  AV Peak Grad:   48.2 mmHg  AV Mean Grad:   26.0 mmHg  LVOT Vmax:     94.60 cm/s  LVOT Vmean:    51.800 cm/s  LVOT VTI:     0.189 m  LVOT/AV VTI ratio: 0.38    AORTA  Ao Root diam: 3.40 cm  Ao Asc diam: 3.20 cm  Ao Arch diam: 2.8 cm   MITRAL VALVE        TRICUSPID VALVE  MV Area (PHT): 2.33 cm  TR Peak grad:  23.8 mmHg  MV Decel Time: 325 msec  TR Vmax:    244.00 cm/s  MV E velocity: 58.30 cm/s  MV A velocity: 95.50 cm/s SHUNTS  MV E/A ratio: 0.61    Systemic VTI: 0.19 m               Systemic Diam: 2.00 cm   Nelva Bush MD   Electronically signed by Nelva Bush MD  Signature Date/Time: 03/06/2020/6:40:39 PM       RIGHT/LEFT HEART CATH AND CORONARY ANGIOGRAPHY  Conclusion    Prox LAD lesion is 40% stenosed.  Mid LAD lesion is 60% stenosed.  Ost 2nd Diag to 2nd Diag lesion is 70% stenosed.  Ost Cx to Prox Cx lesion is 40% stenosed.  Non-stenotic Mid Cx lesion was previously treated.  Prox RCA to Mid RCA lesion is 70% stenosed.  Mid RCA to Dist RCA lesion is 100% stenosed.   1.  No significant change in coronary anatomy since 2019.  Chronically occluded distal right coronary artery with left-to-right collaterals, patent left circumflex stents with minimal restenosis, moderate LAD disease.  The coronary arteries are heavily calcified. 2.  Left ventricular angiography was not performed.  EF was normal by echo. 3.  Right heart catheterization showed low filling pressures, normal pulmonary pressure and normal cardiac output. 4.  Severe aortic stenosis with a mean gradient of 30 mmHg and valve area of 0.8 cm.  Recommendations: Continue medical therapy for coronary artery disease. We will discuss with the patient and family to see if they want to proceed with evaluation regarding TAVR.  Indications  Angina pectoris (HCC) [I20.9 (ICD-10-CM)]  Nonrheumatic aortic valve stenosis [I35.0 (ICD-10-CM)]  Procedural  Details  Technical Details Procedural Details: The pre-existing IV in the right antecubital vein was exchanged under sterile fashion to a 6/7 slender sheath. The right wrist was prepped, draped, and anesthetized with 1% lidocaine. Using the modified Seldinger technique, a 5/6 French sheath was introduced into the right radial artery. 3 mg of verapamil was administered through the sheath, weight-based unfractionated heparin was administered intravenously. Right heart catheterization was performed using a 7 French Swan-Ganz catheter. Cardiac output was calculated by the Fick method. A Jackie  catheter was used for selective coronary angiography.  A JL 3.5 catheter was needed to engage the left main coronary artery.  I tried to cross the aortic valve with a Jacky catheter with a straight wire but was not able to do so.  Ultimately, the JL 3.5 catheter crossed the aortic valve when I was torquing the catheter to engage the left main coronary artery.  Catheter exchanges were performed over an exchange length guidewire. There were no immediate procedural complications. A TR band was used for radial hemostasis at the completion of the procedure.  The patient was transferred to the post catheterization recovery area for further monitoring.  Estimated blood loss <50 mL.   During this procedure medications were administered to achieve and maintain moderate conscious sedation while the patient's heart rate, blood pressure, and oxygen saturation were continuously monitored and I was present face-to-face 100% of this time.  Medications (Filter: Administrations occurring from 1425 to 1544 on 05/16/20) fentaNYL (SUBLIMAZE) injection (mcg) Total dose:  25 mcg Date/Time  Rate/Dose/Volume Action  05/16/20 1434  25 mcg Given    midazolam (VERSED) injection (mg) Total dose:  1 mg Date/Time  Rate/Dose/Volume Action  05/16/20 1434  1 mg Given    Heparin (Porcine) in NaCl 1000-0.9 UT/500ML-% SOLN (mL) Total volume:  1,000 mL Date/Time  Rate/Dose/Volume Action  05/16/20 1436  500 mL Given  1436  500 mL Given    lidocaine (PF) (XYLOCAINE) 1 % injection (mL) Total volume:  4 mL Date/Time  Rate/Dose/Volume Action  05/16/20 1445  2 mL Given  1455  2 mL Given    Radial Cocktail/Verapamil only (mL) Total volume:  10 mL Date/Time  Rate/Dose/Volume Action  05/16/20 1457  10 mL Given    heparin sodium (porcine) injection (Units) Total dose:  3,000 Units Date/Time  Rate/Dose/Volume Action  05/16/20 1459  3,000 Units Given    iohexol (OMNIPAQUE) 350 MG/ML injection (mL) Total volume:  50  mL Date/Time  Rate/Dose/Volume Action  05/16/20 1523  50 mL Given    Sedation Time  Sedation Time Physician-1: 46 minutes 12 seconds  Contrast  Medication Name Total Dose  iohexol (OMNIPAQUE) 350 MG/ML injection 50 mL    Radiation/Fluoro  Fluoro time: 9.6 (min) DAP: 14 (Gycm2) Cumulative Air Kerma: 299.3 (mGy)  Coronary Findings  Diagnostic Dominance: Right Left Anterior Descending  Prox LAD lesion is 40% stenosed. The lesion is severely calcified.  Mid LAD lesion is 60% stenosed.  Second Diagonal [employer] 2nd Diag to 2nd Diag lesion is 70% stenosed.  Left Circumflex  Ost Cx to Prox Cx lesion is 40% stenosed. The lesion is calcified.  Non-stenotic Mid Cx lesion was previously treated. The lesion is type C and located at the major branch. The lesion is calcified.  Right Coronary Artery  Prox RCA to Mid RCA lesion is 70% stenosed. The lesion is severely calcified.  Mid RCA to Dist RCA lesion is 100% stenosed.  Right Posterior Atrioventricular Artery  Collaterals  RPAV filled by collaterals from Acute Mrg.    Second Right Posterolateral Branch  Collaterals  2nd RPL filled by collaterals from 3rd Sept.    Intervention  No interventions have been documented. Coronary Diagrams  Diagnostic Dominance: Right  Intervention  Implants   No implant documentation for this case.  Syngo Images  Show images for CARDIAC CATHETERIZATION Images on Long Term Storage  Show images for Kirstyn, Lean to Procedure Log  Procedure Log    Hemo Data   Most Recent Value  Fick Cardiac Output 4.1 L/min  Fick Cardiac Output Index 2.63 (L/min)/BSA  Aortic Mean Gradient 30.1 mmHg  Aortic Peak Gradient 29 mmHg  Aortic Valve Area 0.80  Aortic Value Area Index 0.51 cm2/BSA  RA A Wave 3 mmHg  RA V Wave 2 mmHg  RA Mean 1 mmHg  RV Systolic Pressure 20 mmHg  RV Diastolic Pressure -1 mmHg  RV EDP 0 mmHg  PA Systolic Pressure 21 mmHg  PA Diastolic Pressure 0 mmHg  PA  Mean 13 mmHg  PW A Wave 6 mmHg  PW V Wave 5 mmHg  PW Mean 2 mmHg  AO Systolic Pressure 423 mmHg  AO Diastolic Pressure 61 mmHg  AO Mean 88 mmHg  LV Systolic Pressure 536 mmHg  LV Diastolic Pressure 2 mmHg  LV EDP 7 mmHg  AOp Systolic Pressure 144 mmHg  AOp Diastolic Pressure 67 mmHg  AOp Mean Pressure 99 mmHg  LVp Systolic Pressure 315 mmHg  LVp Diastolic Pressure 2 mmHg  LVp EDP Pressure 6 mmHg  QP/QS 1  TPVR Index 4.94 HRUI  TSVR Index 33.44 HRUI  PVR SVR Ratio 0.13  TPVR/TSVR Ratio 0.15     MRI HEAD WITHOUT CONTRAST  MRA HEAD WITHOUT CONTRAST  TECHNIQUE: Multiplanar, multiecho pulse sequences of the brain and surrounding structures were obtained without intravenous contrast. Angiographic images of the head were obtained using MRA technique without contrast.  COMPARISON:  06/05/2020 head CT and prior.  FINDINGS: MRI HEAD FINDINGS  Brain: Tiny right parietal foci of DWI hyperintensity (5:35). Remote left cerebellar and right thalamic microhemorrhages. No midline shift, ventriculomegaly or extra-axial fluid collection. No mass lesion. Mild cerebral atrophy with ex vacuo dilatation. Chronic left thalamic lacunar insult. Mild chronic microvascular ischemic changes. Sequela of bilateral anterior temporal insults.  Vascular: Please see MRA.  Skull and upper cervical spine: Normal marrow signal.  Sinuses/Orbits: Sequela of bilateral lens replacement. Mild ethmoid, maxillary and sphenoid sinus mucosal thickening. Clear mastoid air cells.  Other: None.  MRA HEAD FINDINGS  Anterior circulation: Patent ICAs. Moderate to severe narrowing of the distal left ICA cervical segment. Mild right supraclinoid ICA narrowing. Right A1 segment hypoplasia. Patent ACAs and MCAs. No aneurysm, or vascular malformation.  Posterior circulation: Patent V4 segments. Mild narrowing of the mid right V4 segment and linear filling defect may reflect a web  versus atheromatous plaque. Patent left PICA. Patent basilar and superior cerebellar arteries. Dominant right AICA. Patent posterior cerebral arteries. No significant stenosis, proximal occlusion, aneurysm, or vascular malformation.  Venous sinuses: No evidence of thrombosis.  Anatomic variants: None of significance.  IMPRESSION: MRI head:  Tiny right parietal acute/subacute infarcts. Chronic left thalamic lacunar insult.  Mild cerebral atrophy and chronic microvascular ischemic changes.  Sequela of remote anterior temporal insults.  MRA head:  Moderate to severe distal left ICA cervical segment narrowing.  Mild right supraclinoid ICA and mid right V4 segment narrowing.  These results will be called to the ordering clinician or representative  by the Radiologist Assistant, and communication documented in the PACS or [employer].   Electronically Signed   By: Primitivo Gauze M.D.   On: 06/06/2020 08:22    Cardiac TAVR CT  TECHNIQUE: The patient was scanned on a Siemens Force 962 slice scanner. A 120 kV retrospective scan was triggered in the descending thoracic aorta at 111 HU's. Gantry rotation speed was 270 msecs and collimation was .9 mm. The 3D data set was reconstructed in 5% intervals of the R-R cycle. Systolic and diastolic phases were analyzed on a dedicated work station using MPR, MIP and VRT modes. The patient received 141mL OMNIPAQUE IOHEXOL 350 MG/ML SOLN of contrast.  FINDINGS: Aortic Valve: Tricuspid aortic valve. Severely reduced cusp separation. Severely thickened, severely calcified aortic valve cusps.  AV calcium score: 1152  Virtual Basal Annulus Measurements:  Maximum/Minimum Diameter: 26.3 x 20.3 mm  Perimeter: 72.7 mm  Area: 395 mm2  No significant LVOT calcifications.  Based on these measurements, the annulus would be suitable for a 23 mm Sapien 3 valve.  Sinus of Valsalva  Measurements:  Non-coronary:  32 mm  Right - coronary:  32 mm  Left - coronary:  32 mm  Sinus of Valsalva Height:  Left: 14.3 mm  Right: 18.7 mm  Aorta: 3 vessel aortic arch branch pattern. Moderate aortic atherosclerosis in arch and descending thoracic aorta.  Sinotubular Junction:  27 mm  Ascending Thoracic Aorta:  32 mm  Aortic Arch:  25 mm  Descending Thoracic Aorta:  22 mm  Coronary Artery Height above Annulus:  Left Main: 15.5 mm  Right Coronary: 15.0 mm  Coronary Arteries: 3 vessel CAD.  Optimum Fluoroscopic Angle for Delivery: LAO 22, CAU 18  Moderate-severe mitral annular calcification.  No LA appendage thrombus.  Thickened mitral valve.  Severe asymmetric left ventricular hypertrophy, at least 17 mm at the basal septum.  Angulation of the aorta.  IMPRESSION: 1. Tricuspid aortic valve. Severely reduced cusp separation. Severely thickened, severely calcified aortic valve cusps.  2.  AV calcium score: 1152  3.  Annulus area 395 mm2, suitable for 23 mm Sapien 3 valve.  4.  Adequate coronary artery heights from annulus.  5. Optimum Fluoroscopic Angle for Delivery: LAO 22, CAU 18  6. Severe asymmetric left ventricular hypertrophy, at least 17 mm at basal septum, and angulation of the aorta.   Electronically Signed   By: Cherlynn Kaiser   On: 06/17/2020 11:51    CT CHEST, ABDOMEN AND PELVIS WITHOUT CONTRAST  TECHNIQUE: Multidetector CT imaging of the chest, abdomen and pelvis was performed following the standard protocol without IV contrast.  COMPARISON:  Chest CT 04/12/2010. CT the abdomen and pelvis 07/13/2009.  FINDINGS: CTA CHEST FINDINGS  Cardiovascular: Heart size is normal. There is no significant pericardial fluid, thickening or pericardial calcification. There is aortic atherosclerosis, as well as atherosclerosis of the great vessels of the mediastinum and the coronary arteries,  including calcified atherosclerotic plaque in the left main, left anterior descending, left circumflex and right coronary arteries. Severe thickening calcification of the aortic valve. Calcifications of the mitral annulus.  Mediastinum/Lymph Nodes: No pathologically enlarged mediastinal or hilar lymph nodes. Esophagus is unremarkable in appearance. No axillary lymphadenopathy.  Lungs/Pleura: No suspicious appearing pulmonary nodules or masses are noted. No acute consolidative airspace disease. No pleural effusions.  Musculoskeletal/Soft Tissues: There are no aggressive appearing lytic or blastic lesions noted in the visualized portions of the skeleton.  CTA ABDOMEN AND PELVIS FINDINGS  Hepatobiliary: No suspicious cystic or  solid hepatic lesions. No intra or extrahepatic biliary ductal dilatation. Status post cholecystectomy.  Pancreas: No pancreatic mass. No pancreatic ductal dilatation. No pancreatic or peripancreatic fluid collections or inflammatory changes.  Spleen: Unremarkable.  Adrenals/Urinary Tract: Bilateral kidneys and bilateral adrenal glands are normal in appearance. No hydroureteronephrosis. Urinary bladder is normal in appearance.  Stomach/Bowel: Normal appearance of the stomach. No pathologic dilatation of small bowel or colon. Numerous colonic diverticula are noted, without surrounding inflammatory changes to suggest an acute diverticulitis at this time. Status post right hemicolectomy.  Vascular/Lymphatic: Aortic atherosclerosis, without evidence of aneurysm or dissection in the abdominal or pelvic vasculature. Vascular findings and measurements pertinent to potential TAVR procedure, as detailed below. No lymphadenopathy noted in the abdomen or pelvis.  Reproductive: Status post hysterectomy. Ovaries are not confidently identified may be surgically absent or atrophic.  Other: No significant volume of ascites.  No  pneumoperitoneum.  Musculoskeletal: There are no aggressive appearing lytic or blastic lesions noted in the visualized portions of the skeleton.  VASCULAR MEASUREMENTS PERTINENT TO TAVR:  AORTA:  Minimal Aortic Diameter-12 x 12 mm  Severity of Aortic Calcification-moderate  RIGHT PELVIS:  Right Common Iliac Artery -  Minimal Diameter-8.3 x 8.6 mm  Tortuosity-mild  Calcification-mild  Right External Iliac Artery -  Minimal Diameter-5.9 x 5.9 mm  Tortuosity-moderate  Calcification-none  Right Common Femoral Artery -  Minimal Diameter-6.8 x 6.5 mm  Tortuosity-mild  Calcification-mild  LEFT PELVIS:  Left Common Iliac Artery -  Minimal Diameter-7.9 x 8.6 mm  Tortuosity-mild  Calcification-mild  Left External Iliac Artery -  Minimal Diameter-6.2 x 6.2 mm  Tortuosity-moderate  Calcification-none  Left Common Femoral Artery -  Minimal Diameter-6.8 x 4.6 mm  Tortuosity-mild  Calcification-mild  Review of the MIP images confirms the above findings.  IMPRESSION: 1. Vascular findings and measurements pertinent to potential TAVR procedure, as detailed above. 2. Severe thickening calcification of the aortic valve, compatible with reported clinical history of severe aortic stenosis. 3. Aortic atherosclerosis, in addition to left main and 3 vessel coronary artery disease. 4. Colonic diverticulosis without evidence of acute diverticulitis at this time. 5. Additional incidental findings, as above.   Electronically Signed   By: Vinnie Langton M.D.   On: 06/16/2020 05:22    EKG: NSR w/out significant AV conduction delay (06/06/2020)    Impression:  Patient has mixed aortic valve disease with at least moderate aortic stenosis and moderate to severe aortic insufficiency with normal left ventricular systolic function.  Intermittently in the past she has experienced symptoms of both exertional chest tightness and  shortness of breath, although in our office today she denies any recent history of either.  She recently suffered a TIA with multiple small embolic strokes identified on MRI.  She does not have significant extracranial cerebrovascular disease.  She has no known history of atrial fibrillation although 14-day ambulatory telemetry monitor test has just been completed and results remain pending.  At present the patient's most concerning complaint is that of greater than 1 year history of worsening memory loss that is becoming problematic.  The patient's grandson remarks that her memory has gotten considerably worse and also notes that her blood pressure frequently runs greater than 300 mmHg systolic at home.  I have personally reviewed the patient's most recent transthoracic echocardiogram, diagnostic cardiac catheterization, CT angiograms, EKG, MRI and carotid duplex scan.  Echocardiogram performed August 2021 was notable for marginal sound transmission but clearly identified the presence of normal left ventricular systolic function and severe left  ventricular hypertrophy with significant diastolic dysfunction.  The aortic valve is trileaflet with moderate to severe thickening and some calcification involving all 3 leaflets.  Peak velocity across aortic valve measured as high as 3.5 m/s corresponding to mean transvalvular gradient estimated as high as 26 mmHg and aortic valve area calculated 1.18 cm by VTI.  The DVI was reported 0.38 and stroke-volume index 38.  Diagnostic cardiac catheterization revealed stable multivessel coronary artery disease and confirmed the presence of aortic stenosis with mean transvalvular gradient measured 30 mmHg corresponding to aortic valve area calculated 0.80 cm.  Cardiac output and right heart pressures were normal. Gated CT angiogram of the heart reveals trileaflet aortic valve with reduced cusp separation and significant leaflet calcification. The calcium score was 1152. The  aortic annulus was relatively small but suitable for transcatheter aortic valve replacement using a 23 mm balloon expandable Sapien 3 transcatheter heart valve. There is severe asymmetric septal and left ventricular hypertrophy.  Aortography reveals what appears to be adequate pelvic vascular access for transcatheter aortic valve replacement via transfemoral approach.  Most recent EKG reveals sinus rhythm without significant AV conduction delay.  MRI of the brain reveals the presence of multiple tiny defects in the right parietal cortex consistent with embolic stroke.  Carotid duplex scan reveals no sign of significant extracranial stenosis of either left or right internal carotid artery.    Plan:  I have discussed the results of the patient's multiple recent diagnostic tests at length with the patient and her grandson in the office today.  We discussed the presence of aortic stenosis and aortic insufficiency that has been progressive over the last few years.  We discussed potentially significant associated symptoms including previously reported history of exertional shortness of breath and chest discomfort.  We discussed the natural history of aortic valve disease as well as treatment options at length.  The risks associated with conventional surgical aortic valve replacement were discussed in detail, as were expectations for post-operative convalescence, and why I would not consider this patient a candidate for conventional surgery under any circumstances.  Issues specific to transcatheter aortic valve replacement were discussed including questions about long term valve durability, the potential for paravalvular leak, possible increased risk of need for permanent pacemaker placement, and other technical complications related to the procedure itself.  Long-term prognosis with medical therapy was discussed. This discussion was placed in the context of the patient's own specific clinical presentation and past  medical history.  All of their questions have been addressed.  We discussed the implications of the patient's recent embolic stroke and possible causes.  As the next step the patient's multiple diagnostic tests will be reviewed at length by our multidisciplinary heart valve team.  We will further discuss options including continued close follow-up on medical therapy versus proceeding with elective transcatheter aortic valve replacement in the near future.  We also discussed whether or not transesophageal echocardiogram might be reasonable to further evaluate possible embolic source for the patient's recent stroke.  All questions have been answered.   I spent in excess of 90 minutes during the conduct of this office consultation and >50% of this time involved direct face-to-face encounter with the patient for counseling and/or coordination of their care.       Valentina Gu. Roxy Manns, MD 06/25/2020 12:02 PM

## 2020-06-26 ENCOUNTER — Other Ambulatory Visit: Payer: Self-pay

## 2020-06-26 DIAGNOSIS — I517 Cardiomegaly: Secondary | ICD-10-CM

## 2020-06-26 DIAGNOSIS — I350 Nonrheumatic aortic (valve) stenosis: Secondary | ICD-10-CM

## 2020-06-26 DIAGNOSIS — I351 Nonrheumatic aortic (valve) insufficiency: Secondary | ICD-10-CM

## 2020-06-27 ENCOUNTER — Telehealth: Payer: Self-pay | Admitting: *Deleted

## 2020-06-27 NOTE — Telephone Encounter (Signed)
Spoke with [name] to discuss preferred weekdays and times for scheduling the Cardiac MRI that was ordered by Dr. Roxy Manns.  Once we hear from the insurance company regarding the prior authorization, I will call with the appointment.  [name] voiced her understanding.

## 2020-06-29 ENCOUNTER — Encounter: Payer: Self-pay | Admitting: *Deleted

## 2020-06-29 ENCOUNTER — Ambulatory Visit: Payer: Medicare Other | Admitting: Family Medicine

## 2020-06-29 NOTE — Telephone Encounter (Signed)
Spoke with [facility] (caregiver) regarding Cardiac MRI scheduled appointemnt on Tuesday 07/24/20 at 12:00pm---arrival time is 11:30 am at [facility]--1st floor admissions office for check in.  Will mail information to patient and it is available in My Chart-  [name] voiced her understanding.

## 2020-07-01 ENCOUNTER — Other Ambulatory Visit: Payer: Self-pay

## 2020-07-01 ENCOUNTER — Emergency Department
Admission: EM | Admit: 2020-07-01 | Discharge: 2020-07-01 | Disposition: A | Discharge status: Home / Self Care | Payer: Medicare Other | Attending: Emergency Medicine | Admitting: Emergency Medicine

## 2020-07-01 DIAGNOSIS — Z9861 Coronary angioplasty status: Secondary | ICD-10-CM | POA: Diagnosis not present

## 2020-07-01 DIAGNOSIS — Z79899 Other long term (current) drug therapy: Secondary | ICD-10-CM | POA: Diagnosis not present

## 2020-07-01 DIAGNOSIS — Z8673 Personal history of transient ischemic attack (TIA), and cerebral infarction without residual deficits: Secondary | ICD-10-CM | POA: Diagnosis not present

## 2020-07-01 DIAGNOSIS — Z85028 Personal history of other malignant neoplasm of stomach: Secondary | ICD-10-CM | POA: Insufficient documentation

## 2020-07-01 DIAGNOSIS — Z7901 Long term (current) use of anticoagulants: Secondary | ICD-10-CM | POA: Insufficient documentation

## 2020-07-01 DIAGNOSIS — Z7982 Long term (current) use of aspirin: Secondary | ICD-10-CM | POA: Insufficient documentation

## 2020-07-01 DIAGNOSIS — J45909 Unspecified asthma, uncomplicated: Secondary | ICD-10-CM | POA: Insufficient documentation

## 2020-07-01 DIAGNOSIS — I10 Essential (primary) hypertension: Secondary | ICD-10-CM | POA: Insufficient documentation

## 2020-07-01 DIAGNOSIS — I2510 Atherosclerotic heart disease of native coronary artery without angina pectoris: Secondary | ICD-10-CM | POA: Diagnosis not present

## 2020-07-01 DIAGNOSIS — J449 Chronic obstructive pulmonary disease, unspecified: Secondary | ICD-10-CM | POA: Insufficient documentation

## 2020-07-01 LAB — COMPREHENSIVE METABOLIC PANEL
ALT: 23 U/L (ref 0–44)
AST: 33 U/L (ref 15–41)
Albumin: 4.5 g/dL (ref 3.5–5.0)
Alkaline Phosphatase: 46 U/L (ref 38–126)
Anion gap: 11 (ref 5–15)
BUN: 10 mg/dL (ref 8–23)
CO2: 27 mmol/L (ref 22–32)
Calcium: 9.4 mg/dL (ref 8.9–10.3)
Chloride: 95 mmol/L — ABNORMAL LOW (ref 98–111)
Creatinine, Ser: 0.63 mg/dL (ref 0.44–1.00)
GFR, Estimated: >60 mL/min (ref >60)
Glucose, Bld: 121 mg/dL — ABNORMAL HIGH (ref 70–99)
Potassium: 3.6 mmol/L (ref 3.5–5.1)
Sodium: 133 mmol/L — ABNORMAL LOW (ref 135–145)
Total Bilirubin: 1 mg/dL (ref 0.3–1.2)
Total Protein: 7.8 g/dL (ref 6.5–8.1)

## 2020-07-01 LAB — CBC WITH DIFFERENTIAL/PLATELET
Abs Immature Granulocytes: 0.02 10*3/uL (ref 0.00–0.07)
Basophils Absolute: 0 10*3/uL (ref 0.0–0.1)
Basophils Relative: 0 %
Eosinophils Absolute: 0 10*3/uL (ref 0.0–0.5)
Eosinophils Relative: 1 %
HCT: 35.9 % — ABNORMAL LOW (ref 36.0–46.0)
Hemoglobin: 12.6 g/dL (ref 12.0–15.0)
Immature Granulocytes: 0 %
Lymphocytes Relative: 29 %
Lymphs Abs: 2.2 10*3/uL (ref 0.7–4.0)
MCH: 31.8 pg (ref 26.0–34.0)
MCHC: 35.1 g/dL (ref 30.0–36.0)
MCV: 90.7 fL (ref 80.0–100.0)
Monocytes Absolute: 0.5 10*3/uL (ref 0.1–1.0)
Monocytes Relative: 6 %
Neutro Abs: 4.9 10*3/uL (ref 1.7–7.7)
Neutrophils Relative %: 64 %
Platelets: 199 10*3/uL (ref 150–400)
RBC: 3.96 MIL/uL (ref 3.87–5.11)
RDW: 11.9 % (ref 11.5–15.5)
WBC: 7.7 10*3/uL (ref 4.0–10.5)
nRBC: 0 % (ref 0.0–0.2)

## 2020-07-01 LAB — TROPONIN I (HIGH SENSITIVITY)
Troponin I (High Sensitivity): 28 ng/L — ABNORMAL HIGH (ref <18)
Troponin I (High Sensitivity): 31 ng/L — ABNORMAL HIGH (ref <18)

## 2020-07-01 MED ORDER — METOPROLOL TARTRATE 5 MG/5ML IV SOLN
5.0000 mg | Freq: Once | INTRAVENOUS | Status: AC
  Administered 2020-07-01: 5 mg via INTRAVENOUS
  Filled 2020-07-01: qty 5

## 2020-07-01 NOTE — ED Notes (Signed)
Pt stating she feels "funny"- pt states she is not having chest pain but feels like she is going to pass out

## 2020-07-01 NOTE — ED Provider Notes (Signed)
[facility] Emergency Department Provider Note   ____________________________________________   I have reviewed the triage vital signs and the nursing notes.   HISTORY  Chief Complaint Hypertension   History limited by: Not Limited   HPI Amy Morton is a 85 y.o. female who presents to the emergency department today because of concerns for elevated blood pressure.  Patient states that she checks her blood pressure every day.  She has noticed recently that it has been running high.  She states that she does get occasional funny feelings in her chest but this has been going on for a long time.  She denies any pain in her chest.  Patient denies missing any of her blood pressure medications.  Records reviewed. Per medical record review patient has a history of anxiety, HTN. Recent admission for TIA.   Past Medical History:  Diagnosis Date  . Abnormal mammogram 2007   Recommend close follow up  . Anemia    Iron, b12, SPEP, UPEP normal, 12/2012  . Anxiety   . Aortic stenosis    Severe by [facility] 05/2020  . aortic stenosis   . Arthritis   . Asthma   . CAD (coronary artery disease)    s/p angioplasty 1996  . Chronic cough   . Depression   . Diverticulosis   . Dyspnea   . Elevated LFTs 2010   s/p ultrasound w/ possible fatty liver; GI consult. Resolution 2011  . Gastric ulcer   . GERD (gastroesophageal reflux disease)   . History of colonic polyps    Dr. Vira Agar  . HOH (hard of hearing)   . Hyperlipidemia   . Hypertension   . Peritoneal carcinomatosis [facility]) 2001   Dr. Oliva Bustard & Dr. Claiborne Rigg s/p chemo  . Shingles 2008   T-9 distribution  . Valvular heart disease    Mild to Moderate MR, AS  . Vitamin D deficiency   . Wheezing     Patient Active Problem List   Diagnosis Date Noted  . History of CVA (cerebrovascular accident)   . Numbness 06/05/2020  . Hot flashes 02/10/2020  . Asthma without status asthmaticus 12/15/2019  . Essential  hypertension 12/15/2019  . Prediabetes 02/11/2019  . Memory difficulty 02/08/2018  . Angina pectoris ([place]) 01/22/2018  . Non-rheumatic mitral regurgitation 02/12/2017  . Orthostasis 10/20/2016  . Arthritis 05/22/2016  . COPD (chronic obstructive pulmonary disease) ([place]) 11/21/2015  . Hyponatremia 09/30/2015  . CAD (coronary artery disease) 04/27/2015  . Complex tear of medial meniscus of right knee as current injury 07/26/2014  . Primary osteoarthritis of right knee 07/26/2014  . Anemia 05/12/2014  . Aortic valve stenosis 04/18/2014  . History of peritoneal carcinoma 04/17/2014  . Chronic diarrhea 04/17/2014  . Hyperlipidemia 04/17/2014  . Anxiety and depression 11/13/2013  . Vitamin D deficiency 11/13/2013    Past Surgical History:  Procedure Laterality Date  . ABDOMINAL HYSTERECTOMY  2001  . APPENDECTOMY    . CARDIAC CATHETERIZATION  2007   50% mid LAD stenosis, 70% proximal RCA with 100% distal RCA stenosis with collaterals, EF 65%  . CARDIAC CATHETERIZATION N/A 04/27/2015   Procedure: Left Heart Cath and Coronary Angiography;  Surgeon: Jettie Booze, MD;  Location: [place] [facility];  Service: Cardiovascular;  Laterality: N/A;  . CARDIAC CATHETERIZATION N/A 04/27/2015   Procedure: Coronary Stent Intervention;  Surgeon: Jettie Booze, MD;  Location: [place] [facility];  Service: Cardiovascular;  Laterality: N/A;  . CATARACT EXTRACTION W/PHACO Left 09/25/2016  Procedure: CATARACT EXTRACTION PHACO AND INTRAOCULAR LENS PLACEMENT (IOC);  Surgeon: Eulogio Bear, MD;  Location: [facility] ORS;  Service: Ophthalmology;  Laterality: Left;  [place] 46.7 AP% 8.1 CDE 3.80 Fluid pack lot # [id number]  . CATARACT EXTRACTION W/PHACO Right 11/06/2016   Procedure: CATARACT EXTRACTION PHACO AND INTRAOCULAR LENS PLACEMENT ([place]);  Surgeon: Eulogio Bear, MD;  Location: [facility] ORS;  Service: Ophthalmology;  Laterality: Right;  [place] 49.5 AP8.7 CDE4.28 LOT # [id number]  . CHOLECYSTECTOMY  1987   . COLECTOMY  2001  . CORONARY ANGIOPLASTY  1996  . CORONARY ANGIOPLASTY    . LAPAROSCOPIC BILATERAL SALPINGO OOPHERECTOMY  1988   Dr. Laverta Baltimore  . LEFT HEART CATH AND CORONARY ANGIOGRAPHY N/A 01/25/2018   Procedure: LEFT HEART CATH AND CORONARY ANGIOGRAPHY;  Surgeon: Wellington Hampshire, MD;  Location: [place] [facility];  Service: Cardiovascular;  Laterality: N/A;  . RIGHT/LEFT HEART CATH AND CORONARY ANGIOGRAPHY N/A 05/16/2020   Procedure: RIGHT/LEFT HEART CATH AND CORONARY ANGIOGRAPHY;  Surgeon: Wellington Hampshire, MD;  Location: [place] [facility];  Service: Cardiovascular;  Laterality: N/A;  . TONSILLECTOMY  1950    Prior to Admission medications   Medication Sig Start Date End Date Taking? Authorizing Provider  acetaminophen (TYLENOL) 500 MG tablet Take 1,000 mg by mouth in the morning and at bedtime.    [provider]  albuterol (VENTOLIN HFA) 108 (90 Base) MCG/ACT inhaler Inhale 2 puffs into the lungs every 4 (four) hours as needed for wheezing or shortness of breath. 08/19/19   [name], MD  aspirin 81 MG tablet Take 81 mg by mouth daily.    [provider]  atorvastatin (LIPITOR) 80 MG tablet TAKE 1/2 TABLET(40 MG) BY MOUTH DAILY AT 6 PM 05/24/20   Wellington Hampshire, MD  carboxymethylcellul-glycerin (REFRESH RELIEVA) 0.5-0.9 % ophthalmic solution Place 1 drop into both eyes daily as needed for dry eyes.    [provider]  Cholecalciferol 5000 units TABS Take 5,000 Units by mouth daily.    [provider]  clopidogrel (PLAVIX) 75 MG tablet TAKE 1 TABLET BY MOUTH DAILY WITH BREAKFAST 03/12/20   Wellington Hampshire, MD  isosorbide mononitrate (IMDUR) 30 MG 24 hr tablet TAKE 1 TABLET(30 MG) BY MOUTH DAILY 12/12/19   Wellington Hampshire, MD  loperamide (IMODIUM A-D) 2 MG tablet Take 2 mg by mouth 4 (four) times daily as needed for diarrhea or loose stools.    [provider]  metoprolol tartrate (LOPRESSOR) 25 MG tablet Take 1 tablet (25 mg  total) by mouth 2 (two) times daily. 06/06/20   Loletha Grayer, MD  mirtazapine (REMERON) 45 MG tablet TAKE 1 TABLET BY MOUTH EVERY NIGHT AT BEDTIME 06/07/20   [name], MD  Multiple Vitamin (MULTIVITAMIN) capsule Take 1 capsule by mouth daily.    [provider]  nitroGLYCERIN (NITROSTAT) 0.4 MG SL tablet PLACE 1 TABLET UNDER THE TONGUE EVERY 5 MINUTES AS NEEDED FOR CHEST PAIN, 3 DOSES MAX 11/24/19   Wellington Hampshire, MD  Nutritional Supplements (OSTEO ADVANCE) TABS Take 1 tablet by mouth daily.     [provider]  omega-3 acid ethyl esters (LOVAZA) 1 g capsule TAKE 2 CAPSULES BY MOUTH TWICE DAILY 06/11/20   Einar Pheasant, MD  pantoprazole (PROTONIX) 40 MG tablet TAKE 1 TABLET(40 MG) BY MOUTH DAILY 12/12/19   Wellington Hampshire, MD  Ubiquinol (QUNOL COQ10/UBIQUINOL/MEGA) 100 MG CAPS Take 100 mg by mouth every morning.    [provider]  vitamin B-12 (CYANOCOBALAMIN) 250 MCG tablet Take 250 mcg by mouth daily.    [provider]    Allergies Abilify [aripiprazole] and Celexa [citalopram]  Family History  Problem Relation Age of Onset  . Hypertension Mother   . Stroke Mother 24       Cerebral Hemorrhage  . Cancer Father        bone cancer  . Diabetes Sister   . Diabetes Brother   . Heart disease Brother   . Cancer Daughter 40       Ovarian cancer  . Colon cancer Neg Hx   . Colon polyps Neg Hx     Social History Social History   Tobacco Use  . Smoking status: Never Smoker  . Smokeless tobacco: Never Used  Vaping Use  . Vaping Use: Never used  Substance Use Topics  . Alcohol use: No    Alcohol/week: 0.0 standard drinks  . Drug use: No    Review of Systems Constitutional: No fever/chills Eyes: No visual changes. ENT: No sore throat. Cardiovascular: Denies chest pain. Respiratory: Denies shortness of breath. Gastrointestinal: No abdominal pain.  No nausea, no vomiting.  No diarrhea.   Genitourinary: Negative for  dysuria. Musculoskeletal: Negative for back pain. Skin: Negative for rash. Neurological: Negative for headaches, focal weakness or numbness.  ____________________________________________   PHYSICAL EXAM:  VITAL SIGNS: ED Triage Vitals  Enc Vitals Group     BP 07/01/20 1530 (!) 196/101     Pulse Rate 07/01/20 1530 97     Resp 07/01/20 1530 18     Temp 07/01/20 1530 98.4 F (36.9 C)     Temp Source 07/01/20 1530 Oral     SpO2 07/01/20 1530 99 %     Weight 07/01/20 1527 129 lb (58.5 kg)     Height 07/01/20 1527 4\' 11"  (1.499 m)     Head Circumference --      Peak Flow --      Pain Score 07/01/20 1527 0   Constitutional: Alert and oriented.  Eyes: Conjunctivae are normal.  ENT      Head: Normocephalic and atraumatic.      Nose: No congestion/rhinnorhea.      Mouth/Throat: Mucous membranes are moist.      Neck: No stridor. Hematological/Lymphatic/Immunilogical: No cervical lymphadenopathy. Cardiovascular: Normal rate, regular rhythm.  Systolic murmur. Respiratory: Normal respiratory effort without tachypnea nor retractions. Breath sounds are clear and equal bilaterally. No wheezes/rales/rhonchi. Gastrointestinal: Soft and non tender. No rebound. No guarding.  Genitourinary: Deferred Musculoskeletal: Normal range of motion in all extremities. No lower extremity edema. Neurologic:  Normal speech and language. No gross focal neurologic deficits are appreciated.  Skin:  Skin is warm, dry and intact. No rash noted. Psychiatric: Mood and affect are normal. Speech and behavior are normal. Patient exhibits appropriate insight and judgment.  ____________________________________________    LABS (pertinent positives/negatives)  CMP na 133, k 3.6, co2 27, glu 121, cr 0.63 CBC wbc 7.7, hgb 12.6, plt 199 Trop hs 28 to 31  ____________________________________________   EKG  I, Nance Pear, attending physician, personally viewed and interpreted this EKG  EKG Time:  1607 Rate: 99 Rhythm: normal sinus rhythm Axis: left axis deviation Intervals: qtc 454 QRS: narrow ST changes: no st elevation Impression: abnormal ekg ____________________________________________    RADIOLOGY  None  ____________________________________________   PROCEDURES  Procedures  ____________________________________________   INITIAL IMPRESSION / ASSESSMENT AND PLAN / ED COURSE  Pertinent labs & imaging results that were  available during my care of the patient were reviewed by me and considered in my medical decision making (see chart for details).   Patient presented to the emergency department today because of concerns for high blood pressure.  Patient has history of high blood pressure.  She states she checks it multiple times a day.  She did show me a piece of paper where she is checked at roughly 10 times already today before coming to the emergency department.  She denies any pain.  Work-up here does show a slightly elevated troponin although she had this in previous visit.  Discussed with patient that frequent blood pressure check itself can help drive anxiety which can create elevation of blood pressure.  Did recommend that she cut down the frequency of blood pressure checks. Did give dose of blood pressure medication here which helped her blood pressure.   ____________________________________________   FINAL CLINICAL IMPRESSION(S) / ED DIAGNOSES  Final diagnoses:  Hypertension, unspecified type     Note: This dictation was prepared with Dragon dictation. Any transcriptional errors that result from this process are unintentional     Nance Pear, MD 07/01/20 2236

## 2020-07-01 NOTE — ED Notes (Signed)
EKG looked at by Dr Ellender Hose who gave VO for blood work

## 2020-07-01 NOTE — ED Triage Notes (Signed)
Pt states she has a hx of hypertyension and has been keeping a check on it and it has been progressively getting higher- pt has a list of her bp's with her- pt states she took 2 of her SL nitroglycerin to help get her BP down but they did not help- pt denies any chest pain or other accompanying symptoms

## 2020-07-01 NOTE — Discharge Instructions (Addendum)
Please seek medical attention for any high fevers, chest pain, shortness of breath, change in behavior, persistent vomiting, bloody stool or any other new or concerning symptoms.  

## 2020-07-02 ENCOUNTER — Other Ambulatory Visit: Payer: Self-pay

## 2020-07-06 ENCOUNTER — Ambulatory Visit: Payer: Medicare Other | Admitting: Family Medicine

## 2020-07-06 ENCOUNTER — Telehealth: Payer: Self-pay | Admitting: Cardiovascular Disease

## 2020-07-06 NOTE — Telephone Encounter (Signed)
Pt c/o BP issue: STAT if pt c/o blurred vision, one-sided weakness or slurred speech  1. What are your last 5 BP readings?  Today 9:30 am 142/78, 8:30 179/89 No other reading available  2. Are you having any other symptoms (ex. Dizziness, headache, blurred vision, passed out)? Dizziness and some occasional pain in her chest, took Nitro last night "to bring her BP down  3. What is your BP issue? Elevated  Pt c/o of Chest Pain: STAT if CP now or developed within 24 hours  1. Are you having CP right now? No, but had some pain last night  2. Are you experiencing any other symptoms (ex. SOB, nausea, vomiting, sweating)? dizziness  3. How long have you been experiencing CP? Last night  4. Is your CP continuous or coming and going? Off and on last night  5. Have you taken Nitroglycerin? 2 Nitro last night.  ?

## 2020-07-06 NOTE — Telephone Encounter (Signed)
Amy Morton made aware of [name], NP response and recommendation with verbalized understanding. She will back on Tues or Wed next week to report the patients BP readings and give an update. Amy Morton voiced appreciation for the call back.

## 2020-07-06 NOTE — Telephone Encounter (Signed)
Anxiety medication will need to be addressed by her primary care provider. She may be able to see another provider in their office if Dr. Caryl Bis is out or be seen next week by their office.  Anticipate checking BP frequently contributes to anxiety about BP. Recommend only checking once per day 2-3 hours after medications. A systolic BP after medications of 120-145 is okay. She has severe aortic stenosis which is the likely driving cause of her dizziness. With her severe aortic stenosis an elevated blood pressure is okay as it allows greater pressure behind the valve while we proceed with workup with Dr. Angelena Form.  Recommend checking BP once per day 2-3 hours after medications and keeping a log. If she calls those readings in on Tuesday or Wednesday we can address whether any changes need to be made.  Loel Dubonnet, NP

## 2020-07-06 NOTE — Telephone Encounter (Signed)
Spoke with the patients sister and care taker [name].  The reading provide below taken at 8:30 am this morning 179/89 was jut 30 min after her medication. Her BP reading at 9:30 am 142/78. Valerie Roys that ideally Bp should be checked a min 2-3 hours after medication.   I asked [name] to recheck the pt BP and HR while I held the line 140/82 72 bpm. (checked manually). The patient is currently asymptomatic. Last night the patient did complain of dizziness and chest pressure, her BP elevated at the time. Those readings are not available the patient thew away her BP log. She was given Nitro x2 to help bring down her BP.  [name] sts that the patient was taking metoprolol 50 mg bid for years it was decreased to 25 mg bid after the patients last d/c from the hospital. She was seen in the ED on 07/01/20 for hypertension. [name] increased the pt metoprolol back up to 50 mg bid starting on Tues 07/03/20. She is also taking imdur 30 mg qd. [name] sts that the patient has been stressed regarding her upcoming procedure/sx and thinks the patient needs something for her nerves. The pt was schedule to see her pcp today but the appt was cancelled since Dr. Caryl Bis had a family emergency.  [name] ask if [name], NP was prescribe something for the patient. I adv her that we do NOT prescribe anxiety medications. She insisted that the rqst be fwd to [facility] anyway.  Valerie Roys that I will fwd the msg to [facility] and call back with her recommendation.

## 2020-07-09 ENCOUNTER — Encounter: Payer: Self-pay | Admitting: Family Medicine

## 2020-07-09 ENCOUNTER — Other Ambulatory Visit: Payer: Self-pay

## 2020-07-09 ENCOUNTER — Ambulatory Visit (INDEPENDENT_AMBULATORY_CARE_PROVIDER_SITE_OTHER): Payer: Medicare Other | Admitting: Family Medicine

## 2020-07-09 DIAGNOSIS — I10 Essential (primary) hypertension: Secondary | ICD-10-CM | POA: Diagnosis not present

## 2020-07-09 DIAGNOSIS — F419 Anxiety disorder, unspecified: Secondary | ICD-10-CM

## 2020-07-09 DIAGNOSIS — I200 Unstable angina: Secondary | ICD-10-CM

## 2020-07-09 DIAGNOSIS — F32A Depression, unspecified: Secondary | ICD-10-CM

## 2020-07-09 MED ORDER — CLONAZEPAM 0.5 MG PO TABS: 0.2500 mg | ORAL_TABLET | Freq: Two times a day (BID) | ORAL | 0 refills | Status: DC | PRN

## 2020-07-09 MED ORDER — ISOSORBIDE MONONITRATE ER 30 MG PO TB24: 45.0000 mg | ORAL_TABLET | Freq: Every day | ORAL | 1 refills | Status: DC

## 2020-07-09 NOTE — Patient Instructions (Addendum)
Nice to see you. We will try a short term supply of klonopin for your anxiety. Please take this only when you feel that you need it. Please be careful as it may make you drowsy. Please do not drive while taking this medication.  Please increase your imdur to 45 mg once daily.

## 2020-07-13 ENCOUNTER — Telehealth: Payer: Self-pay | Admitting: Family

## 2020-07-13 NOTE — Telephone Encounter (Signed)
Called and spoke with [name] the patient sister to check on her blood pressure per DPR.  She reports she checked Ms. Dills's blood pressure yesterday and it was better.  She does not recall the exact reading.  Tells me her blood pressure is improved since increasing Imdur to 1.5 tablets.  She has not had to take the Klonopin but feels reassured that she has it if she needs it.  We reviewed that she has an upcoming appointment 07/18/2020. Loel Dubonnet, NP

## 2020-07-16 NOTE — Assessment & Plan Note (Signed)
Anxiety continues to be an issue.  No depression.  She will remain on Remeron 30 mg once daily.  She did have what appeared to be a panic attack while in the office.  She was able to calm down and the symptoms resolved.  Discussed that we could trial a very low-dose of clonazepam to use only when really needed.  Discussed risks of drowsiness as well as addiction and dependence and the risk of dementia with chronic use.  We will follow up with her in 6 weeks.

## 2020-07-16 NOTE — Assessment & Plan Note (Signed)
Improved control though still not quite at goal.  I conferred with cardiology, [name], NP, who recommended increasing her Imdur to 45 mg once daily.  The patient will continue on metoprolol 50 mg twice daily.  She will follow-up with me in 6 weeks.  I do believe the patient's anxiety is driving at least some portion of her blood pressure issues.

## 2020-07-16 NOTE — Progress Notes (Signed)
[name], MD Phone: [phone]  Amy Morton is a 85 y.o. female who presents today for follow-up.  Hypertension: Blood pressures range 140-183/69-86 at home.  Pulse 72-94.  She is currently on Imdur and metoprolol.  No chest pain.  No shortness of breath.  Anxiety: Patient notes quite a bit of anxiety.  She is been taking Remeron 30 mg nightly.  Has not helped significantly with her anxiety though does note she is sleeping quite a bit better.  Her sister notes that the patient worries about financial things and her son also gets on her nerves.  They wonder about something that they could take on an as-needed basis to help with her anxiety.  The patient feels as though she is having panic attacks at times.  She feels as though her chest will flutter when she gets very nervous.  In the past she is taken a nitroglycerin and that is helped with those symptoms.  Social History   Tobacco Use  Smoking Status Never Smoker  Smokeless Tobacco Never Used     ROS see history of present illness  Objective  Physical Exam Vitals:   07/09/20 1600  BP: 130/70  Pulse: 92  Temp: 98.2 F (36.8 C)  SpO2: 95%    BP Readings from Last 3 Encounters:  07/09/20 130/70  07/01/20 (!) 148/96  06/25/20 (!) 159/95   Wt Readings from Last 3 Encounters:  07/09/20 128 lb 3.2 oz (58.2 kg)  07/01/20 129 lb (58.5 kg)  06/25/20 131 lb (59.4 kg)    Physical Exam Constitutional:      General: She is not in acute distress.    Appearance: She is not diaphoretic.  Cardiovascular:     Rate and Rhythm: Normal rate and regular rhythm.     Heart sounds: Normal heart sounds.  Pulmonary:     Effort: Pulmonary effort is normal.     Breath sounds: Normal breath sounds.  Musculoskeletal:        General: No edema.  Skin:    General: Skin is warm and dry.  Neurological:     Mental Status: She is alert.  Psychiatric:     Comments: Appears anxious      Assessment/Plan: Please see individual  problem list.  Problem List Items Addressed This Visit    Anxiety and depression    Anxiety continues to be an issue.  No depression.  She will remain on Remeron 30 mg once daily.  She did have what appeared to be a panic attack while in the office.  She was able to calm down and the symptoms resolved.  Discussed that we could trial a very low-dose of clonazepam to use only when really needed.  Discussed risks of drowsiness as well as addiction and dependence and the risk of dementia with chronic use.  We will follow up with her in 6 weeks.      Relevant Medications   clonazePAM (KLONOPIN) 0.5 MG tablet   Essential hypertension    Improved control though still not quite at goal.  I conferred with cardiology, [name], NP, who recommended increasing her Imdur to 45 mg once daily.  The patient will continue on metoprolol 50 mg twice daily.  She will follow-up with me in 6 weeks.  I do believe the patient's anxiety is driving at least some portion of her blood pressure issues.      Relevant Medications   isosorbide mononitrate (IMDUR) 30 MG 24 hr tablet  This visit occurred during the SARS-CoV-2 public health emergency.  Safety protocols were in place, including screening questions prior to the visit, additional usage of staff PPE, and extensive cleaning of exam room while observing appropriate contact time as indicated for disinfecting solutions.    Rook Maue, MD [facility] [place]  

## 2020-07-18 ENCOUNTER — Encounter: Payer: Self-pay | Admitting: Family

## 2020-07-18 ENCOUNTER — Ambulatory Visit (INDEPENDENT_AMBULATORY_CARE_PROVIDER_SITE_OTHER): Payer: Medicare Other | Admitting: Family

## 2020-07-18 ENCOUNTER — Other Ambulatory Visit: Payer: Self-pay

## 2020-07-18 VITALS — BP 126/80 | HR 80 | Ht 59.0 in | Wt 129.0 lb

## 2020-07-18 DIAGNOSIS — I25118 Atherosclerotic heart disease of native coronary artery with other forms of angina pectoris: Secondary | ICD-10-CM | POA: Diagnosis not present

## 2020-07-18 DIAGNOSIS — I10 Essential (primary) hypertension: Secondary | ICD-10-CM | POA: Diagnosis not present

## 2020-07-18 DIAGNOSIS — I350 Nonrheumatic aortic (valve) stenosis: Secondary | ICD-10-CM

## 2020-07-18 DIAGNOSIS — E782 Mixed hyperlipidemia: Secondary | ICD-10-CM | POA: Diagnosis not present

## 2020-07-18 MED ORDER — ATORVASTATIN CALCIUM 40 MG PO TABS: 40.0000 mg | ORAL_TABLET | Freq: Every day | ORAL | 3 refills | Status: DC

## 2020-07-18 NOTE — Patient Instructions (Addendum)
Medication Instructions:  Your physician has recommended you make the following change in your medication:   We have sent the Atorvastatin to 40mg  daily tablet. You will take one tablet per day.   *If you need a refill on your cardiac medications before your next appointment, please call your pharmacy*  Lab Work: Your provider recommends that you return for lab work today: BMP  If you have labs (blood work) drawn today and your tests are completely normal, you will receive your results only by: Marland Kitchen MyChart Message (if you have MyChart) OR . A paper copy in the mail If you have any lab test that is abnormal or we need to change your treatment, we will call you to review the results.  Testing/Procedures: Your cardiac MRI is scheduled for 07/24/20.    Follow-Up: At [facility], you and your health needs are our priority.  As part of our continuing mission to provide you with exceptional heart care, we have created designated Provider Care Teams.  These Care Teams include your primary Cardiologist (physician) and Advanced Practice Providers (APPs -  Physician Assistants and Nurse Practitioners) who all work together to provide you with the care you need, when you need it.  We recommend signing up for the patient portal called "MyChart".  Sign up information is provided on this After Visit Summary.  MyChart is used to connect with patients for Virtual Visits (Telemedicine).  Patients are able to view lab/test results, encounter notes, upcoming appointments, etc.  Non-urgent messages can be sent to your provider as well.   To learn more about what you can do with MyChart, go to [email or web address].    Your next appointment:   In April   The format for your next appointment:   In Person  Provider:   You may see Kathlyn Sacramento, MD or one of the following Advanced Practice Providers on your designated Care Team:    Murray Hodgkins, NP  Christell Faith, PA-C  Marrianne Mood,  PA-C  Cadence Kathlen Mody, [place]  [name], NP  Other Instructions  Call our office if your blood pressure is consistently more than 130/80.

## 2020-07-18 NOTE — Progress Notes (Signed)
Office Visit    Patient Name: Amy Morton Date of Encounter: 07/18/2020  Primary Care Provider:  [name], MD Primary Cardiologist:  Amy Sacramento, MD Electrophysiologist:  None   Chief Complaint    Amy Morton is a 85 y.o. female with a hx of CAD, severe aortic stenosis, anemia, HTN, HLD, anxiety, depression presents today for follow-up of blood pressure and severe aortic stenosis.  Past Medical History    Past Medical History:  Diagnosis Date  . Abnormal mammogram 2007   Recommend close follow up  . Anemia    Iron, b12, SPEP, UPEP normal, 12/2012  . Anxiety   . Aortic stenosis    Severe by [facility] 05/2020  . aortic stenosis   . Arthritis   . Asthma   . CAD (coronary artery disease)    s/p angioplasty 1996  . Chronic cough   . Depression   . Diverticulosis   . Dyspnea   . Elevated LFTs 2010   s/p ultrasound w/ possible fatty liver; GI consult. Resolution 2011  . Gastric ulcer   . GERD (gastroesophageal reflux disease)   . History of colonic polyps    Dr. Vira Morton  . HOH (hard of hearing)   . Hyperlipidemia   . Hypertension   . Peritoneal carcinomatosis [facility]) 2001   Dr. Oliva Morton & Dr. Claiborne Morton s/p chemo  . Shingles 2008   T-9 distribution  . Valvular heart disease    Mild to Moderate MR, AS  . Vitamin D deficiency   . Wheezing    Past Surgical History:  Procedure Laterality Date  . ABDOMINAL HYSTERECTOMY  2001  . APPENDECTOMY    . CARDIAC CATHETERIZATION  2007   50% mid LAD stenosis, 70% proximal RCA with 100% distal RCA stenosis with collaterals, EF 65%  . CARDIAC CATHETERIZATION N/A 04/27/2015   Procedure: Left Heart Cath and Coronary Angiography;  Surgeon: Amy Booze, MD;  Location: [facility];  Service: Cardiovascular;  Laterality: N/A;  . CARDIAC CATHETERIZATION N/A 04/27/2015   Procedure: Coronary Stent Intervention;  Surgeon: Amy Booze, MD;  Location: [facility];  Service: Cardiovascular;   Laterality: N/A;  . CATARACT EXTRACTION W/PHACO Left 09/25/2016   Procedure: CATARACT EXTRACTION PHACO AND INTRAOCULAR LENS PLACEMENT (I[facility]);  Surgeon: Amy Bear, MD;  Location: [facility];  Service: Ophthalmology;  Laterality: Left;  [place] 46.7 AP% 8.1 CDE 3.80 Fluid pack lot # [id number]  . CATARACT EXTRACTION W/PHACO Right 11/06/2016   Procedure: CATARACT EXTRACTION PHACO AND INTRAOCULAR LENS PLACEMENT ([place]);  Surgeon: Amy Bear, MD;  Location: [facility];  Service: Ophthalmology;  Laterality: Right;  [place] 49.5 AP8.7 CDE4.28 LOT # [id number]  . CHOLECYSTECTOMY  1987  . COLECTOMY  2001  . CORONARY ANGIOPLASTY  1996  . CORONARY ANGIOPLASTY    . LAPAROSCOPIC BILATERAL SALPINGO OOPHERECTOMY  1988   Dr. Laverta Morton  . LEFT HEART CATH AND CORONARY ANGIOGRAPHY N/A 01/25/2018   Procedure: LEFT HEART CATH AND CORONARY ANGIOGRAPHY;  Surgeon: Amy Hampshire, MD;  Location: [place];  Service: Cardiovascular;  Laterality: N/A;  . RIGHT/LEFT HEART CATH AND CORONARY ANGIOGRAPHY N/A 05/16/2020   Procedure: RIGHT/LEFT HEART CATH AND CORONARY ANGIOGRAPHY;  Surgeon: Amy Hampshire, MD;  Location: [place] [facility];  Service: Cardiovascular;  Laterality: N/A;  . TONSILLECTOMY  1950    Allergies  Allergies  Allergen Reactions  . Abilify [Aripiprazole] Other (See Comments)    Burning in Chest Elevated b/p  Insomnia  . Celexa [Citalopram]     Caused low sodium level.     History of Present Illness    Amy Morton is a 85 y.o. female with a hx of  CAD, aortic stenosis, anemia, HTN, HLD, anxiety, depression last seen 06/11/2020 by Dr. Angelena Morton  Her CAD is s/p left circumflex PCI in 2016 with known occluded distal RCA.  He was hospitalized June 2019 with unstable angina.  Cardiac catheterization with patent left circumflex stent without significant restenosis, stable discrete 70% stenosis in the mid LAD and chronically occluded distal RCA with collaterals.  Aortic stenosis was  moderate that time with peak gradient of 20 mmHg, LVEDP mildly elevated.  Echo 03/06/2020 with LVEF greater than 75%, severe LVH, grade 1 diastolic dysfunction, RV SF low normal, RV normal size, normal PASP, LA mildly dilated, moderate MR, moderate to severe AI with moderate aortic stenosis (VTI 1.18 cm, mean gradient 26.0 mmHg, V-max 3.47 m/s).   She was seen in follow-up 05/10/2020 by Dr. Fletcher Morton.  She noted progressive exertional chest tightness described as burning with minimal activities.  Her EKG was without acute changes.  She had dizziness and presyncope in the office and was noted to be markedly hypotensive.  She was transferred to the ED for evaluation.  She was then scheduled for cardiac catheterization.  She had right and left heart cath 05/16/2020 with no significant change in coronary anatomy since 2019. Noted CTO of RCA with left-to-right collateral, patent left circumflex stents with minimal restenosis, moderate LAD disease. Her right heart catheterization showed low filling pressures, normal pulmonary pressure, normal cardiac output and severe aortic stenosis with mean gradient of 30 mmHg and valve area 0.8 cm^2.  Since last time she has seen Dr. Angelena Morton and is undergoing TAVR work-up.  Presents today with her friend Amy Morton. Amy Morton lost her husband 10 years ago and her daughter in 2018 and does not have very much family left.  Her friend Amy Morton is her "sister by choice" and helps with a lot of her medical care.  She was seen by her primary care provider last week due to elevated blood pressures after discussion with her primary care provider her Imdur was increased to 45 mg daily.  Reports at that time her blood pressure has been fair.  She did purchase a small cuff that fits her arm better and blood pressure this morning was 140s over 70s.  Well controlled today in clinic.  She reports her symptoms of lightheadedness and dyspnea on exertion are stable at baseline.  Reports no chest pain,  pressure, tightness.  EKGs/Labs/Other Studies Reviewed:   The following studies were reviewed today:  Cardiac catheterization 05/16/2020  Prox LAD lesion is 40% stenosed.  Mid LAD lesion is 60% stenosed.  Ost 2nd Diag to 2nd Diag lesion is 70% stenosed.  Ost Cx to Prox Cx lesion is 40% stenosed.  Non-stenotic Mid Cx lesion was previously treated.  Prox RCA to Mid RCA lesion is 70% stenosed.  Mid RCA to Dist RCA lesion is 100% stenosed.   1.  No significant change in coronary anatomy since 2019.  Chronically occluded distal right coronary artery with left-to-right collaterals, patent left circumflex stents with minimal restenosis, moderate LAD disease.  The coronary arteries are heavily calcified. 2.  Left ventricular angiography was not performed.  EF was normal by echo. 3.  Right heart catheterization showed low filling pressures, normal pulmonary pressure and normal cardiac output. 4.  Severe aortic stenosis with a mean  gradient of 30 mmHg and valve area of 0.8 cm.   Recommendations: Continue medical therapy for coronary artery disease. We will discuss with the patient and family to see if they want to proceed with evaluation regarding TAVR.  Echo 03/06/2020 1. Left ventricular ejection fraction, by estimation, is >75%. The left  ventricle has hyperdynamic function. Left ventricular endocardial border  not optimally defined to evaluate regional wall motion. There is severe  left ventricular hypertrophy. Left  ventricular diastolic parameters are consistent with Grade I diastolic  dysfunction (impaired relaxation). Elevated left atrial pressure.   2. Right ventricular systolic function is low normal. The right  ventricular size is normal. There is normal pulmonary artery systolic  pressure.   3. Left atrial size was mildly dilated.   4. The mitral valve is degenerative. Moderate mitral valve regurgitation.   5. The aortic valve has an indeterminant number of cusps. Aortic  valve  regurgitation is moderate to severe. Moderate aortic valve stenosis.  Aortic valve area, by VTI measures 1.18 cm. Aortic valve mean gradient  measures 26.0 mmHg. Aortic valve Vmax  measures 3.47 m/s.   6. The inferior vena cava is normal in size with greater than 50%  respiratory variability, suggesting right atrial pressure of 3 mmHg.   EKG:  EKG is  ordered today.  The ekg ordered today demonstrates NSR 78 bpm with left axis deviation and LVH.  Recent Labs: 11/17/2019: Magnesium 1.5 06/06/2020: TSH 2.509 07/01/2020: ALT 23; BUN 10; Creatinine, Ser 0.63; Hemoglobin 12.6; Platelets 199; Potassium 3.6; Sodium 133  Recent Lipid Panel    Component Value Date/Time   CHOL 95 06/06/2020 0552   CHOL 115 09/03/2017 1049   TRIG 68 06/06/2020 0552   HDL 41 06/06/2020 0552   HDL 50 09/03/2017 1049   CHOLHDL 2.3 06/06/2020 0552   VLDL 14 06/06/2020 0552   LDLCALC 40 06/06/2020 0552   LDLCALC 29 09/03/2017 1049   LDLDIRECT 35.0 02/21/2019 1012    Home Medications   Current Meds  Medication Sig  . acetaminophen (TYLENOL) 500 MG tablet Take 1,000 mg by mouth in the morning and at bedtime.  . Ascorbic Acid (VITAMIN C PO) Take by mouth.  Marland Kitchen aspirin 81 MG tablet Take 81 mg by mouth daily.  . carboxymethylcellul-glycerin (REFRESH RELIEVA) 0.5-0.9 % ophthalmic solution Place 1 drop into both eyes daily as needed for dry eyes.  . Cholecalciferol 5000 units TABS Take 5,000 Units by mouth daily.  . clonazePAM (KLONOPIN) 0.5 MG tablet Take 0.5 tablets (0.25 mg total) by mouth 2 (two) times daily as needed for anxiety.  . clopidogrel (PLAVIX) 75 MG tablet TAKE 1 TABLET BY MOUTH DAILY WITH BREAKFAST  . isosorbide mononitrate (IMDUR) 30 MG 24 hr tablet Take 1.5 tablets (45 mg total) by mouth daily.  Marland Kitchen loperamide (IMODIUM A-D) 2 MG tablet Take 2 mg by mouth 4 (four) times daily as needed for diarrhea or loose stools.  . metoprolol tartrate (LOPRESSOR) 50 MG tablet Take 50 mg by mouth 2 (two) times  daily.  . mirtazapine (REMERON) 45 MG tablet TAKE 1 TABLET BY MOUTH EVERY NIGHT AT BEDTIME  . Multiple Vitamin (MULTIVITAMIN) capsule Take 1 capsule by mouth daily.  . nitroGLYCERIN (NITROSTAT) 0.4 MG SL tablet PLACE 1 TABLET UNDER THE TONGUE EVERY 5 MINUTES AS NEEDED FOR CHEST PAIN, 3 DOSES MAX  . Nutritional Supplements (OSTEO ADVANCE) TABS Take 1 tablet by mouth daily.   Marland Kitchen omega-3 acid ethyl esters (LOVAZA) 1 g capsule TAKE 2 CAPSULES  BY MOUTH TWICE DAILY  . pantoprazole (PROTONIX) 40 MG tablet TAKE 1 TABLET(40 MG) BY MOUTH DAILY  . Ubiquinol 100 MG CAPS Take 100 mg by mouth every morning.  . vitamin B-12 (CYANOCOBALAMIN) 250 MCG tablet Take 250 mcg by mouth daily.  . [DISCONTINUED] atorvastatin (LIPITOR) 80 MG tablet TAKE 1/2 TABLET(40 MG) BY MOUTH DAILY AT 6 PM     Review of Systems   All other systems reviewed and are otherwise negative except as noted above.  Physical Exam    VS:  BP 126/80 (BP Location: Left Arm, Patient Position: Sitting, Cuff Size: Normal)   Pulse 80   Ht 4\' 11"  (1.499 m)   Wt 129 lb (58.5 kg)   SpO2 97%   BMI 26.05 kg/m  , BMI Body mass index is 26.05 kg/m.  Wt Readings from Last 3 Encounters:  07/18/20 129 lb (58.5 kg)  07/09/20 128 lb 3.2 oz (58.2 kg)  07/01/20 129 lb (58.5 kg)    GEN: Well nourished, well developed, in no acute distress. HEENT: normal. Neck: Supple, no JVD, carotid bruits, or masses. Cardiac: RRR, no  rubs, or gallops.  3 out of 6 systolic murmur best auscultated at RUSB, no clubbing, cyanosis, edema.  Radials/DP/PT 2+ and equal bilaterally.  Respiratory:  Respirations regular and unlabored, clear to auscultation bilaterally. GI: Soft, nontender, nondistended. MS: No deformity or atrophy. Skin: Warm and dry, no rash.  Right cath site clean, dry, intact with no hematoma, ecchymosis, signs of infection. Neuro:  Strength and sensation are intact. Psych: Normal affect.  Assessment & Plan    1. CAD - LHC 05/16/2020 with stable  coronary anatomy. Known CTO of RCA with left-to-right collateral, patent left circumflex stent with minimal restenosis, moderate LAD disease.  GDMT includes aspirin, beta-blocker, statin, Imdur. Severe aortic stenosis likely driving majority of her her symptoms, see below.  2. Severe aortic stenosis -[facility]HC 05/16/2020 with severe aortic stenosis with mean gradient 41mmHg and valve area 0.8 cm. She reports lightheadedness and dyspnea on exertion.   She is presently undergoing TAVR evaluation with Dr. Angelena Morton and has cardiac MRI upcoming next week.  Collect BMP today for precardiac MRI lab work.   Due to severe AS we will opt for some element of permissive hypertension.   3. HTN - allow for some element of permissive hypertension given severe aortic stenosis.  Recently purchased her blood pressure cuff with a small cuff her arm and her blood pressure this morningHer friend, Amy Morton, always 140s over 70s.  Blood pressure today in clinic well controlled.  Continue present antihypertensive regimen of Imdur 45 mg daily, Lopressor 50 mg twice daily.  4. HLD -continue atorvastatin 40 mg daily.  We will send refill and as she has been cutting the 80 mg tablets in half and would prefer 40 mg tablet.  Disposition: Follow-up with valve clinic as directed.  Cardiac MRI 07/16/2020.  Follow up In approximately 4 months with Dr. Fletcher Morton or APP though this may be adjusted pending her TAVR work-up.  Signed, Loel Dubonnet, NP 07/18/2020, 1:10 PM [place]

## 2020-07-19 LAB — BASIC METABOLIC PANEL
BUN/Creatinine Ratio: 16 (ref 12–28)
BUN: 12 mg/dL (ref 8–27)
CO2: 20 mmol/L (ref 20–29)
Calcium: 8.8 mg/dL (ref 8.7–10.3)
Chloride: 102 mmol/L (ref 96–106)
Creatinine, Ser: 0.76 mg/dL (ref 0.57–1.00)
GFR calc Af Amer: 84 mL/min/{1.73_m2} (ref >59)
GFR calc non Af Amer: 73 mL/min/{1.73_m2} (ref >59)
Glucose: 110 mg/dL — ABNORMAL HIGH (ref 65–99)
Potassium: 4.2 mmol/L (ref 3.5–5.2)
Sodium: 138 mmol/L (ref 134–144)

## 2020-07-23 ENCOUNTER — Telehealth (HOSPITAL_COMMUNITY): Payer: Self-pay | Admitting: Emergency Medicine

## 2020-07-23 NOTE — Telephone Encounter (Signed)
Reaching out to patient to offer assistance regarding upcoming cardiac imaging study; pt verbalizes understanding of appt date/time, parking situation and where to check in, and verified current allergies; name and call back number provided for further questions should they arise Amy Bond RN Navigator Cardiac Imaging [facility] [phone] office [phone] cell   Spoke with [name] who verbalized understanding

## 2020-07-24 ENCOUNTER — Other Ambulatory Visit: Payer: Self-pay | Admitting: Thoracic Surgery (Cardiothoracic Vascular Surgery)

## 2020-07-24 ENCOUNTER — Other Ambulatory Visit: Payer: Self-pay

## 2020-07-24 ENCOUNTER — Ambulatory Visit: Payer: Medicare Other | Admitting: Family Medicine

## 2020-07-24 ENCOUNTER — Ambulatory Visit (HOSPITAL_COMMUNITY)
Admission: RE | Admit: 2020-07-24 | Discharge: 2020-07-24 | Disposition: A | Discharge status: Home / Self Care | Payer: Medicare Other | Source: Ambulatory Visit | Attending: Thoracic Surgery (Cardiothoracic Vascular Surgery) | Admitting: Thoracic Surgery (Cardiothoracic Vascular Surgery)

## 2020-07-24 DIAGNOSIS — I350 Nonrheumatic aortic (valve) stenosis: Secondary | ICD-10-CM

## 2020-07-24 DIAGNOSIS — I517 Cardiomegaly: Secondary | ICD-10-CM

## 2020-07-24 DIAGNOSIS — I351 Nonrheumatic aortic (valve) insufficiency: Secondary | ICD-10-CM | POA: Insufficient documentation

## 2020-07-24 NOTE — Progress Notes (Addendum)
  Evaluation after gadolinium extravasation  Patient seen and examined immediately after gadolinium extravasation while in MRI waiting area. Per MRI tech approximately 5-10 cc of gadolinium instilled prior to patient reporting right arm pain. IV was removed, imaging was completed and patient is now dressed in waiting area prior to returning home. She denies any tenderness, numbness, tingling or change in ROM.  Exam: There is swelling at the right forearm area - approximately 8 x 4 cm area of non tender induration at medial portion of right forearm extending from elbow to just above mid forearm area.  There is minimal erythema. There is no discoloration. There are no blisters. There are no signs of decreased perfusion of the skin.  It is warm to touch.  The patient has full ROM in fingers.  Radial pulse is normal, easily palpable.  Per contrast extravasation protocol, I have instructed the patient to keep an ice pack on the area for 20-60 minutes at a time for about 48 hours.   Keep arm elevated as much as possible.   The patient understands to call the radiology department if there is: - increase in pain or swelling - changed or altered sensation - ulceration or blistering - increasing redness - warmth or increasing firmness - decreased tissue perfusion as noted by decreased capillary refill or discoloration of skin - decreased pulses peripheral to site   [employer] PA-C 07/24/2020 1:16 PM

## 2020-07-31 ENCOUNTER — Ambulatory Visit: Payer: Medicare Other | Admitting: Family Medicine

## 2020-08-02 ENCOUNTER — Ambulatory Visit: Payer: Medicare Other | Admitting: Cardiovascular Disease

## 2020-08-07 ENCOUNTER — Other Ambulatory Visit: Payer: Self-pay | Admitting: Physician Assistant

## 2020-08-07 DIAGNOSIS — I350 Nonrheumatic aortic (valve) stenosis: Secondary | ICD-10-CM

## 2020-08-07 DIAGNOSIS — I517 Cardiomegaly: Secondary | ICD-10-CM

## 2020-08-16 ENCOUNTER — Encounter (HOSPITAL_COMMUNITY): Payer: Medicare Other

## 2020-08-16 ENCOUNTER — Encounter (HOSPITAL_COMMUNITY): Payer: Self-pay

## 2020-08-16 ENCOUNTER — Other Ambulatory Visit: Payer: Self-pay

## 2020-08-18 ENCOUNTER — Other Ambulatory Visit: Payer: Self-pay

## 2020-08-18 ENCOUNTER — Emergency Department
Admission: EM | Admit: 2020-08-18 | Discharge: 2020-08-18 | Disposition: A | Discharge status: Home / Self Care | Payer: Medicare Other | Attending: Emergency Medicine | Admitting: Emergency Medicine

## 2020-08-18 ENCOUNTER — Emergency Department: Payer: Medicare Other

## 2020-08-18 ENCOUNTER — Encounter: Payer: Self-pay | Admitting: Emergency Medicine

## 2020-08-18 DIAGNOSIS — Z8673 Personal history of transient ischemic attack (TIA), and cerebral infarction without residual deficits: Secondary | ICD-10-CM | POA: Insufficient documentation

## 2020-08-18 DIAGNOSIS — I2510 Atherosclerotic heart disease of native coronary artery without angina pectoris: Secondary | ICD-10-CM | POA: Insufficient documentation

## 2020-08-18 DIAGNOSIS — J3489 Other specified disorders of nose and nasal sinuses: Secondary | ICD-10-CM | POA: Diagnosis not present

## 2020-08-18 DIAGNOSIS — W19XXXA Unspecified fall, initial encounter: Secondary | ICD-10-CM

## 2020-08-18 DIAGNOSIS — Y92009 Unspecified place in unspecified non-institutional (private) residence as the place of occurrence of the external cause: Secondary | ICD-10-CM | POA: Diagnosis not present

## 2020-08-18 DIAGNOSIS — S199XXA Unspecified injury of neck, initial encounter: Secondary | ICD-10-CM | POA: Diagnosis not present

## 2020-08-18 DIAGNOSIS — W01198A Fall on same level from slipping, tripping and stumbling with subsequent striking against other object, initial encounter: Secondary | ICD-10-CM | POA: Diagnosis not present

## 2020-08-18 DIAGNOSIS — Z7902 Long term (current) use of antithrombotics/antiplatelets: Secondary | ICD-10-CM | POA: Diagnosis not present

## 2020-08-18 DIAGNOSIS — S0181XA Laceration without foreign body of other part of head, initial encounter: Secondary | ICD-10-CM | POA: Diagnosis not present

## 2020-08-18 DIAGNOSIS — Z79899 Other long term (current) drug therapy: Secondary | ICD-10-CM | POA: Insufficient documentation

## 2020-08-18 DIAGNOSIS — M4802 Spinal stenosis, cervical region: Secondary | ICD-10-CM | POA: Diagnosis not present

## 2020-08-18 DIAGNOSIS — R220 Localized swelling, mass and lump, head: Secondary | ICD-10-CM | POA: Diagnosis not present

## 2020-08-18 DIAGNOSIS — J45909 Unspecified asthma, uncomplicated: Secondary | ICD-10-CM | POA: Diagnosis not present

## 2020-08-18 DIAGNOSIS — I10 Essential (primary) hypertension: Secondary | ICD-10-CM | POA: Diagnosis not present

## 2020-08-18 DIAGNOSIS — Z7982 Long term (current) use of aspirin: Secondary | ICD-10-CM | POA: Diagnosis not present

## 2020-08-18 DIAGNOSIS — S0993XA Unspecified injury of face, initial encounter: Secondary | ICD-10-CM | POA: Diagnosis present

## 2020-08-18 DIAGNOSIS — S0101XA Laceration without foreign body of scalp, initial encounter: Secondary | ICD-10-CM | POA: Diagnosis not present

## 2020-08-18 DIAGNOSIS — M47812 Spondylosis without myelopathy or radiculopathy, cervical region: Secondary | ICD-10-CM | POA: Diagnosis not present

## 2020-08-18 MED ORDER — LIDOCAINE HCL 1 % IJ SOLN
5.0000 mL | Freq: Once | INTRAMUSCULAR | Status: AC
  Administered 2020-08-18: 5 mL
  Filled 2020-08-18: qty 10

## 2020-08-18 NOTE — ED Provider Notes (Signed)
[facility]EMERGENCY DEPARTMENT  ____________________________________________  Time seen: Approximately 6:31 PM  I have reviewed the triage vital signs and the nursing notes.   HISTORY  Chief Complaint Fall   Historian Patient     HPI Amy Morton is a 86 y.o. female presents to the emergency department after patient had a mechanical fall at her house.  She reports that she tripped on a rug in her home and fell and hit her head.  She has a 2 and half centimeter mostly linear laceration along the mid forehead and significant bruising along the right jaw.  She denies neck pain.  No numbness or tingling in the upper and lower extremities.  No chest pain, chest tightness or abdominal pain.  No other alleviating measures have been attempted.   Past Medical History:  Diagnosis Date  . Abnormal mammogram 2007   Recommend close follow up  . Anemia    Iron, b12, SPEP, UPEP normal, 12/2012  . Anxiety   . Aortic stenosis    Severe by [facility] 05/2020  . aortic stenosis   . Arthritis   . Asthma   . CAD (coronary artery disease)    s/p angioplasty 1996  . Chronic cough   . Depression   . Diverticulosis   . Dyspnea   . Elevated LFTs 2010   s/p ultrasound w/ possible fatty liver; GI consult. Resolution 2011  . Gastric ulcer   . GERD (gastroesophageal reflux disease)   . History of colonic polyps    Dr. Vira Agar  . HOH (hard of hearing)   . Hyperlipidemia   . Hypertension   . Peritoneal carcinomatosis [facility]) 2001   Dr. Oliva Bustard & Dr. Claiborne Rigg s/p chemo  . Shingles 2008   T-9 distribution  . Valvular heart disease    Mild to Moderate MR, AS  . Vitamin D deficiency   . Wheezing      Immunizations up to date:  Yes.     Past Medical History:  Diagnosis Date  . Abnormal mammogram 2007   Recommend close follow up  . Anemia    Iron, b12, SPEP, UPEP normal, 12/2012  . Anxiety   . Aortic stenosis    Severe by [facility] 05/2020  . aortic stenosis   . Arthritis   . Asthma   .  CAD (coronary artery disease)    s/p angioplasty 1996  . Chronic cough   . Depression   . Diverticulosis   . Dyspnea   . Elevated LFTs 2010   s/p ultrasound w/ possible fatty liver; GI consult. Resolution 2011  . Gastric ulcer   . GERD (gastroesophageal reflux disease)   . History of colonic polyps    Dr. Vira Agar  . HOH (hard of hearing)   . Hyperlipidemia   . Hypertension   . Peritoneal carcinomatosis [facility]) 2001   Dr. Oliva Bustard & Dr. Claiborne Rigg s/p chemo  . Shingles 2008   T-9 distribution  . Valvular heart disease    Mild to Moderate MR, AS  . Vitamin D deficiency   . Wheezing     Patient Active Problem List   Diagnosis Date Noted  . History of CVA (cerebrovascular accident)   . Numbness 06/05/2020  . Hot flashes 02/10/2020  . Asthma without status asthmaticus 12/15/2019  . Essential hypertension 12/15/2019  . Prediabetes 02/11/2019  . Memory difficulty 02/08/2018  . Angina pectoris ([place]) 01/22/2018  . Non-rheumatic mitral regurgitation 02/12/2017  . Orthostasis 10/20/2016  . Arthritis 05/22/2016  . COPD (chronic  obstructive pulmonary disease) ([place]) 11/21/2015  . Hyponatremia 09/30/2015  . CAD (coronary artery disease) 04/27/2015  . Complex tear of medial meniscus of right knee as current injury 07/26/2014  . Primary osteoarthritis of right knee 07/26/2014  . Anemia 05/12/2014  . Aortic valve stenosis 04/18/2014  . History of peritoneal carcinoma 04/17/2014  . Chronic diarrhea 04/17/2014  . Hyperlipidemia 04/17/2014  . Anxiety and depression 11/13/2013  . Vitamin D deficiency 11/13/2013    Past Surgical History:  Procedure Laterality Date  . ABDOMINAL HYSTERECTOMY  2001  . APPENDECTOMY    . CARDIAC CATHETERIZATION  2007   50% mid LAD stenosis, 70% proximal RCA with 100% distal RCA stenosis with collaterals, EF 65%  . CARDIAC CATHETERIZATION N/A 04/27/2015   Procedure: Left Heart Cath and Coronary Angiography;  Surgeon: Jettie Booze, MD;  Location:  [place] [facility];  Service: Cardiovascular;  Laterality: N/A;  . CARDIAC CATHETERIZATION N/A 04/27/2015   Procedure: Coronary Stent Intervention;  Surgeon: Jettie Booze, MD;  Location: [place] CV [facility];  Service: Cardiovascular;  Laterality: N/A;  . CATARACT EXTRACTION W/PHACO Left 09/25/2016   Procedure: CATARACT EXTRACTION PHACO AND INTRAOCULAR LENS PLACEMENT (IOC);  Surgeon: Eulogio Bear, MD;  Location: [facility] ORS;  Service: Ophthalmology;  Laterality: Left;  [place] 46.7 AP% 8.1 CDE 3.80 Fluid pack lot # [id number]  . CATARACT EXTRACTION W/PHACO Right 11/06/2016   Procedure: CATARACT EXTRACTION PHACO AND INTRAOCULAR LENS PLACEMENT ([place]);  Surgeon: Eulogio Bear, MD;  Location: [facility] OR[facility];  Service: Ophthalmology;  Laterality: Right;  [place] 49.5 AP8.7 CDE4.28 LOT # [id number]  . CHOLECYSTECTOMY  1987  . COLECTOMY  2001  . CORONARY ANGIOPLASTY  1996  . CORONARY ANGIOPLASTY    . LAPAROSCOPIC BILATERAL SALPINGO OOPHERECTOMY  1988   Dr. Laverta Baltimore  . LEFT HEART CATH AND CORONARY ANGIOGRAPHY N/A 01/25/2018   Procedure: LEFT HEART CATH AND CORONARY ANGIOGRAPHY;  Surgeon: Wellington Hampshire, MD;  Location: [place] [facility];  Service: Cardiovascular;  Laterality: N/A;  . RIGHT/LEFT HEART CATH AND CORONARY ANGIOGRAPHY N/A 05/16/2020   Procedure: RIGHT/LEFT HEART CATH AND CORONARY ANGIOGRAPHY;  Surgeon: Wellington Hampshire, MD;  Location: [place] CV LAB;  Service: Cardiovascular;  Laterality: N/A;  . TONSILLECTOMY  1950    Prior to Admission medications   Medication Sig Start Date End Date Taking? Authorizing Provider  acetaminophen (TYLENOL) 500 MG tablet Take 1,000 mg by mouth in the morning and at bedtime.    [provider]  Ascorbic Acid (VITAMIN C PO) Take by mouth.    [provider]  aspirin 81 MG tablet Take 81 mg by mouth daily.    [provider]  atorvastatin (LIPITOR) 40 MG tablet Take 1 tablet (40 mg total) by mouth daily. 07/18/20 07/13/21  Loel Dubonnet, NP  carboxymethylcellul-glycerin (REFRESH RELIEVA) 0.5-0.9 % ophthalmic solution Place 1 drop into both eyes daily as needed for dry eyes.    [provider]  Cholecalciferol 5000 units TABS Take 5,000 Units by mouth daily.    [provider]  clonazePAM (KLONOPIN) 0.5 MG tablet Take 0.5 tablets (0.25 mg total) by mouth 2 (two) times daily as needed for anxiety. 07/09/20   [name], MD  clopidogrel (PLAVIX) 75 MG tablet TAKE 1 TABLET BY MOUTH DAILY WITH BREAKFAST 03/12/20   Wellington Hampshire, MD  isosorbide mononitrate (IMDUR) 30 MG 24 hr tablet Take 1.5 tablets (45 mg total) by mouth daily. 07/09/20   [name], MD  loperamide (IMODIUM A-D) 2 MG tablet Take 2 mg by mouth 4 (four) times daily as needed for diarrhea or loose stools.    [provider]  metoprolol tartrate (LOPRESSOR) 50 MG tablet Take 50 mg by mouth 2 (two) times daily.    [provider]  mirtazapine (REMERON) 45 MG tablet TAKE 1 TABLET BY MOUTH EVERY NIGHT AT BEDTIME 06/07/20   [name], MD  Multiple Vitamin (MULTIVITAMIN) capsule Take 1 capsule by mouth daily.    [provider]  nitroGLYCERIN (NITROSTAT) 0.4 MG SL tablet PLACE 1 TABLET UNDER THE TONGUE EVERY 5 MINUTES AS NEEDED FOR CHEST PAIN, 3 DOSES MAX 11/24/19   Wellington Hampshire, MD  Nutritional Supplements (OSTEO ADVANCE) TABS Take 1 tablet by mouth daily.     [provider]  omega-3 acid ethyl esters (LOVAZA) 1 g capsule TAKE 2 CAPSULES BY MOUTH TWICE DAILY 06/11/20   Einar Pheasant, MD  pantoprazole (PROTONIX) 40 MG tablet TAKE 1 TABLET(40 MG) BY MOUTH DAILY 12/12/19   Wellington Hampshire, MD  Ubiquinol 100 MG CAPS Take 100 mg by mouth every morning.    [provider]  vitamin B-12 (CYANOCOBALAMIN) 250 MCG tablet Take 250 mcg by mouth daily.    [provider]    Allergies Abilify [aripiprazole] and Celexa [citalopram]  Family History  Problem Relation Age of  Onset  . Hypertension Mother   . Stroke Mother 72       Cerebral Hemorrhage  . Cancer Father        bone cancer  . Diabetes Sister   . Diabetes Brother   . Heart disease Brother   . Cancer Daughter 41       Ovarian cancer  . Colon cancer Neg Hx   . Colon polyps Neg Hx     Social History Social History   Tobacco Use  . Smoking status: Never Smoker  . Smokeless tobacco: Never Used  Vaping Use  . Vaping Use: Never used  Substance Use Topics  . Alcohol use: No    Alcohol/week: 0.0 standard drinks  . Drug use: No     Review of Systems  Constitutional: No fever/chills Eyes:  No discharge ENT: No upper respiratory complaints. Respiratory: no cough. No SOB/ use of accessory muscles to breath Gastrointestinal:   No nausea, no vomiting.  No diarrhea.  No constipation. Musculoskeletal: Negative for musculoskeletal pain. Skin: Patient has laceration.     ____________________________________________   PHYSICAL EXAM:  VITAL SIGNS: ED Triage Vitals  Enc Vitals Group     BP 08/18/20 1504 (!) 171/102     Pulse Rate 08/18/20 1504 100     Resp 08/18/20 1504 20     Temp 08/18/20 1504 98.5 F (36.9 C)     Temp Source 08/18/20 1504 Oral     SpO2 08/18/20 1504 99 %     Weight 08/18/20 1505 125 lb (56.7 kg)     Height 08/18/20 1505 4\' 11"  (1.499 m)     Head Circumference --      Peak Flow --      Pain Score 08/18/20 1504 6     Pain Loc --      Pain Edu? --      Excl. in [place]? --      Constitutional: Alert and oriented. Well appearing and in no acute distress. Eyes: Conjunctivae are normal. PERRL. EOMI. Head: Atraumatic. ENT:      Nose: No congestion/rhinnorhea.  Mouth/Throat: Mucous membranes are moist.  Patient has significant swelling and ecchymosis along the right lower jaw. Neck: No stridor.  FROM.   Cardiovascular: Normal rate, regular rhythm. Normal S1 and S2.  Good peripheral circulation. Respiratory: Normal respiratory effort without tachypnea or  retractions. Lungs CTAB. Good air entry to the bases with no decreased or absent breath sounds Gastrointestinal: Bowel sounds x 4 quadrants. Soft and nontender to palpation. No guarding or rigidity. No distention. Musculoskeletal: Full range of motion to all extremities. No obvious deformities noted Neurologic:  Normal for age. No gross focal neurologic deficits are appreciated.  Skin: Patient has a 2 and half centimeter linear laceration along mid forehead. Psychiatric: Mood and affect are normal for age. Speech and behavior are normal.   ____________________________________________   LABS (all labs ordered are listed, but only abnormal results are displayed)  Labs Reviewed - No data to display ____________________________________________  EKG   ____________________________________________  RADIOLOGY Unk Pinto, personally viewed and evaluated these images (plain radiographs) as part of my medical decision making, as well as reviewing the written report by the radiologist.  CT Head Wo Contrast  Result Date: 08/18/2020 CLINICAL DATA:  [place], hit head, right forehead laceration, anticoagulated EXAM: CT HEAD WITHOUT CONTRAST TECHNIQUE: Contiguous axial images were obtained from the base of the skull through the vertex without intravenous contrast. COMPARISON:  06/06/2020, 06/05/2020 FINDINGS: Brain: No acute infarct or hemorrhage. Lateral ventricles and midline structures are unremarkable. No acute extra-axial fluid collections. No mass effect. Vascular: Extensive atherosclerosis.  No hyperdense vessel. Skull: Midline frontal scalp laceration. No underlying fracture. Remainder of the calvarium is unremarkable. Sinuses/Orbits: No acute finding. Other: None. IMPRESSION: 1. Frontal scalp laceration.  No acute intracranial process. Electronically Signed   By: Randa Ngo M.D.   On: 08/18/2020 16:28   CT Cervical Spine Wo Contrast  Result Date: 08/18/2020 CLINICAL DATA:  [place], hit  head, frontal scalp laceration EXAM: CT CERVICAL SPINE WITHOUT CONTRAST TECHNIQUE: Multidetector CT imaging of the cervical spine was performed without intravenous contrast. Multiplanar CT image reconstructions were also generated. COMPARISON:  12/16/2019 FINDINGS: Alignment: Alignment is anatomic. Skull base and vertebrae: No acute fracture. No primary bone lesion or focal pathologic process. Soft tissues and spinal canal: No prevertebral fluid or swelling. No visible canal hematoma. Disc levels: Mild multilevel spondylosis and facet hypertrophy most pronounced from C3-4 and C4-5. Mild right predominant neural foraminal narrowing at C3-4 and symmetrical neural foraminal narrowing at C4-5. Upper chest: Airway is patent.  Lung apices are clear. Other: Reconstructed images demonstrate no additional findings. IMPRESSION: 1. Stable cervical spondylosis and facet hypertrophy. No acute fracture. Electronically Signed   By: Randa Ngo M.D.   On: 08/18/2020 16:31   CT Maxillofacial Wo Contrast  Result Date: 08/18/2020 CLINICAL DATA:  [place], frontal scalp laceration, right-sided facial swelling EXAM: CT MAXILLOFACIAL WITHOUT CONTRAST TECHNIQUE: Multidetector CT imaging of the maxillofacial structures was performed. Multiplanar CT image reconstructions were also generated. COMPARISON:  None. FINDINGS: Osseous: No acute displaced fractures.  No destructive lesions. Orbits: Negative. No traumatic or inflammatory finding. Sinuses: Polypoid mucosal thickening within the sphenoid sinuses. Remaining paranasal sinuses are clear. Soft tissues: Soft tissue swelling overlies the right side mandible. No fluid collections. Remaining soft tissues are unremarkable. Limited intracranial: No significant or unexpected finding. IMPRESSION: 1. Soft tissue swelling overlying right side mandible. No acute fractures. Electronically Signed   By: Randa Ngo M.D.   On: 08/18/2020 16:35     ____________________________________________    PROCEDURES  Procedure(s) performed:     Marland KitchenMarland KitchenLaceration Repair  Date/Time: 08/18/2020 6:52 PM Performed by: Lannie Fields, PA-C Authorized by: Lannie Fields, PA-C   Consent:    Consent obtained:  Verbal   Consent given by:  Patient   Risks discussed:  Infection and pain Universal protocol:    Patient identity confirmed:  Verbally with patient Anesthesia:    Anesthesia method:  None Laceration details:    Length (cm):  2.5 Pre-procedure details:    Preparation:  Patient was prepped and draped in usual sterile fashion Exploration:    Contaminated: no   Treatment:    Amount of cleaning:  Standard   Irrigation solution:  Sterile saline   Visualized foreign bodies/material removed: no   Skin repair:    Repair method:  Tissue adhesive and sutures   Suture size:  6-0   Number of sutures:  7 Approximation:    Approximation:  Close Repair type:    Repair type:  Simple Post-procedure details:    Dressing:  Open (no dressing)   Procedure completion:  Tolerated well, no immediate complications       Medications  lidocaine (XYLOCAINE) 1 % (with pres) injection 5 mL (5 mLs Infiltration Given 08/18/20 1846)     ____________________________________________   INITIAL IMPRESSION / ASSESSMENT AND PLAN / ED COURSE  Pertinent labs & imaging results that were available during my care of the patient were reviewed by me and considered in my medical decision making (see chart for details).       Assessment and plan Forehead laceration 86 year old female presents to the emergency department with a 2 and half centimeter forehead laceration.  Patient was hypertensive and mildly tachycardic at triage.  On exam, patient was alert, active and nontoxic-appearing with no neurodeficits noted on exam.  CT of the head, face and cervical spine revealed no evidence of intracranial bleed, skull fracture or C-spine fracture.   Patient's laceration was repaired in the emergency department without complication and patient's tetanus status is up-to-date.  Patient declined pain medication multiple times during this emergency department encounter.  She was advised to have external sutures removed by primary care or an urgent care in 5 days.  Patient education regarding wound care was relayed to patient and her sister by phone.   Return precautions were given to return with new or worsening symptoms. All patient questions were answered.    ____________________________________________  FINAL CLINICAL IMPRESSION(S) / ED DIAGNOSES  Final diagnoses:  Fall, initial encounter  Facial laceration, initial encounter      NEW MEDICATIONS STARTED DURING THIS VISIT:  ED Discharge Orders    None          This chart was dictated using voice recognition software/Dragon. Despite best efforts to proofread, errors can occur which can change the meaning. Any change was purely unintentional.     Lannie Fields, PA-C 08/18/20 Gwyndolyn Kaufman, MD 08/18/20 [phone]

## 2020-08-18 NOTE — ED Triage Notes (Signed)
Pt via POV from home. Pt had a mechanical fall prior to arrival. Pt is on Plavix. Pt hit the front of her head on the hardwood floor. Laceration to pt's forehead and R side of pt's jaw is swollen. Pt is A&Ox4 and NAD.

## 2020-08-18 NOTE — Discharge Instructions (Signed)
Have sutures removed in five days.

## 2020-08-20 ENCOUNTER — Encounter: Payer: Self-pay | Admitting: Family Medicine

## 2020-08-20 ENCOUNTER — Ambulatory Visit (INDEPENDENT_AMBULATORY_CARE_PROVIDER_SITE_OTHER): Payer: Medicare Other | Admitting: Family Medicine

## 2020-08-20 ENCOUNTER — Other Ambulatory Visit: Payer: Self-pay

## 2020-08-20 DIAGNOSIS — F32A Depression, unspecified: Secondary | ICD-10-CM

## 2020-08-20 DIAGNOSIS — S0990XA Unspecified injury of head, initial encounter: Secondary | ICD-10-CM | POA: Diagnosis not present

## 2020-08-20 DIAGNOSIS — I10 Essential (primary) hypertension: Secondary | ICD-10-CM | POA: Diagnosis not present

## 2020-08-20 DIAGNOSIS — F419 Anxiety disorder, unspecified: Secondary | ICD-10-CM | POA: Diagnosis not present

## 2020-08-20 NOTE — Assessment & Plan Note (Signed)
Adequately controlled.  She will continue Imdur 45 mg once daily and metoprolol 50 mg twice daily.  I advised that she only check her blood pressure once daily about 2 hours after taking her morning medications.

## 2020-08-20 NOTE — Patient Instructions (Signed)
Nice to see you. Please let us know if you change your mind regarding seeing neurology and psychiatry. Please check your blood pressure only once per day 2 hours after you take your morning medicines.

## 2020-08-20 NOTE — Progress Notes (Signed)
[name], MD Phone: [phone]  Amy Morton is a 86 y.o. female who presents today for f/u.  HYPERTENSION  Disease Monitoring  Home BP Monitoring difficult for her to recall home BPs Chest pain- no    Dyspnea- no Medications  Compliance-  Taking imdur, metoprolol.   Edema- no  Fall: Patient had a fall at home on 08/18/2020.  She was evaluated in the emergency room due to facial laceration on her brow.  She had a CT head, CT cervical spine, and CT maxillofacial that were all negative for acute changes.  She had 7 sutures placed with advice to remove in 5 days.  She has bruising over her cheeks and around her eyes as well as on her chin.  She notes no syncope.  Notes a little bit of a headache with this.  She also reports bruising on her left breast.  They think she hit her breast on a recliner.  Anxiety: Patient reports this is bad.  Her sister indicates that she feels it may be related to the patient not being able to do what she used to do.  She notes there are some family members that are suggesting she limit what she can do given her age.  She feels as though her memory is an issue as well.  Some occasional depression.  No SI.  She remains on Remeron.  She has not needed to take any of the clonazepam.    Social History   Tobacco Use  Smoking Status Never Smoker  Smokeless Tobacco Never Used    Current Outpatient Medications on File Prior to Visit  Medication Sig Dispense Refill  . acetaminophen (TYLENOL) 500 MG tablet Take 1,000 mg by mouth in the morning and at bedtime.    . Ascorbic Acid (VITAMIN C PO) Take by mouth.    Marland Kitchen aspirin 81 MG tablet Take 81 mg by mouth daily.    Marland Kitchen atorvastatin (LIPITOR) 40 MG tablet Take 1 tablet (40 mg total) by mouth daily. 90 tablet 3  . carboxymethylcellul-glycerin (REFRESH RELIEVA) 0.5-0.9 % ophthalmic solution Place 1 drop into both eyes daily as needed for dry eyes.    . Cholecalciferol 5000 units TABS Take 5,000 Units by mouth  daily.    . clonazePAM (KLONOPIN) 0.5 MG tablet Take 0.5 tablets (0.25 mg total) by mouth 2 (two) times daily as needed for anxiety. 10 tablet 0  . clopidogrel (PLAVIX) 75 MG tablet TAKE 1 TABLET BY MOUTH DAILY WITH BREAKFAST 90 tablet 1  . isosorbide mononitrate (IMDUR) 30 MG 24 hr tablet Take 1.5 tablets (45 mg total) by mouth daily. 135 tablet 1  . loperamide (IMODIUM A-D) 2 MG tablet Take 2 mg by mouth 4 (four) times daily as needed for diarrhea or loose stools.    . metoprolol tartrate (LOPRESSOR) 50 MG tablet Take 50 mg by mouth 2 (two) times daily.    . mirtazapine (REMERON) 45 MG tablet TAKE 1 TABLET BY MOUTH EVERY NIGHT AT BEDTIME 30 tablet 3  . Multiple Vitamin (MULTIVITAMIN) capsule Take 1 capsule by mouth daily.    . nitroGLYCERIN (NITROSTAT) 0.4 MG SL tablet PLACE 1 TABLET UNDER THE TONGUE EVERY 5 MINUTES AS NEEDED FOR CHEST PAIN, 3 DOSES MAX 25 tablet 1  . Nutritional Supplements (OSTEO ADVANCE) TABS Take 1 tablet by mouth daily.     Marland Kitchen omega-3 acid ethyl esters (LOVAZA) 1 g capsule TAKE 2 CAPSULES BY MOUTH TWICE DAILY 360 capsule 1  . pantoprazole (PROTONIX) 40 MG  tablet TAKE 1 TABLET(40 MG) BY MOUTH DAILY 90 tablet 2  . Ubiquinol 100 MG CAPS Take 100 mg by mouth every morning.    . vitamin B-12 (CYANOCOBALAMIN) 250 MCG tablet Take 250 mcg by mouth daily.     Current Facility-Administered Medications on File Prior to Visit  Medication Dose Route Frequency Provider Last Rate Last Admin  . sodium chloride 0.9 % nebulizer solution 3 mL  3 mL Nebulization Once Glori Luis, MD       Followed by  . methacholine (PROVOCHOLINE) inhaler solution 0.125 mg  2 mL Inhalation Once Glori Luis, MD       Followed by  . methacholine (PROVOCHOLINE) inhaler solution 0.5 mg  2 mL Inhalation Once Glori Luis, MD       Followed by  . methacholine (PROVOCHOLINE) inhaler solution 2 mg  2 mL Inhalation Once Glori Luis, MD       Followed by  . methacholine (PROVOCHOLINE)  inhaler solution 8 mg  2 mL Inhalation Once Glori Luis, MD       Followed by  . methacholine (PROVOCHOLINE) inhaler solution 32 mg  2 mL Inhalation Once Glori Luis, MD         ROS see history of present illness  Objective  Physical Exam Vitals:   08/20/20 1347  BP: (!) 142/80  Pulse: 75  Temp: 98 F (36.7 C)  SpO2: 95%    BP Readings from Last 3 Encounters:  08/20/20 (!) 142/80  08/18/20 (!) 189/103  07/18/20 126/80   Wt Readings from Last 3 Encounters:  08/20/20 126 lb 9.6 oz (57.4 kg)  08/18/20 125 lb (56.7 kg)  07/18/20 129 lb (58.5 kg)    Physical Exam Constitutional:      General: She is not in acute distress.    Appearance: She is not diaphoretic.  HENT:     Head:      Comments: Bruising around eyes, and over right chin Cardiovascular:     Rate and Rhythm: Normal rate and regular rhythm.     Heart sounds: Murmur (3/6 systolic murmur) heard.    Pulmonary:     Effort: Pulmonary effort is normal.     Breath sounds: Normal breath sounds.  Chest:    Musculoskeletal:        General: No edema.  Skin:    General: Skin is warm and dry.  Neurological:     Mental Status: She is alert.      Assessment/Plan: Please see individual problem list.  Problem List Items Addressed This Visit    Anxiety and depression    Seems to be stable.  Offered referral to psychiatry given lack of improvement though she declines this.  Offered referral for therapy though she declines that as well.  Offered referral to neurology given her reported memory issues though she declines that as well.  She will remain on Remeron at this time.  She has clonazepam to take on an as-needed basis.      Essential hypertension    Adequately controlled.  She will continue Imdur 45 mg once daily and metoprolol 50 mg twice daily.  I advised that she only check her blood pressure once daily about 2 hours after taking her morning medications.      Head injury    Patient with  fall with head injury recently.  Laceration appears to be well-healing with no signs of infection.  She will return on Thursday for suture removal.  I discussed that the bruising would likely take weeks if not months to resolve completely.  She will monitor.           This visit occurred during the SARS-CoV-2 public health emergency.  Safety protocols were in place, including screening questions prior to the visit, additional usage of staff PPE, and extensive cleaning of exam room while observing appropriate contact time as indicated for disinfecting solutions.    [name], MD [place]

## 2020-08-20 NOTE — Assessment & Plan Note (Signed)
Patient with fall with head injury recently.  Laceration appears to be well-healing with no signs of infection.  She will return on Thursday for suture removal.  I discussed that the bruising would likely take weeks if not months to resolve completely.  She will monitor.

## 2020-08-20 NOTE — Assessment & Plan Note (Signed)
Seems to be stable.  Offered referral to psychiatry given lack of improvement though she declines this.  Offered referral for therapy though she declines that as well.  Offered referral to neurology given her reported memory issues though she declines that as well.  She will remain on Remeron at this time.  She has clonazepam to take on an as-needed basis.

## 2020-08-23 ENCOUNTER — Encounter: Payer: Self-pay | Admitting: Internal Medicine

## 2020-08-23 ENCOUNTER — Ambulatory Visit (INDEPENDENT_AMBULATORY_CARE_PROVIDER_SITE_OTHER): Payer: Medicare Other | Admitting: Internal Medicine

## 2020-08-23 ENCOUNTER — Ambulatory Visit: Payer: Medicare Other | Admitting: Family Medicine

## 2020-08-23 ENCOUNTER — Other Ambulatory Visit: Payer: Self-pay

## 2020-08-23 VITALS — BP 138/70 | HR 83 | Temp 97.5°F | Ht 59.0 in | Wt 125.8 lb

## 2020-08-23 DIAGNOSIS — Z4802 Encounter for removal of sutures: Secondary | ICD-10-CM

## 2020-08-23 DIAGNOSIS — I38 Endocarditis, valve unspecified: Secondary | ICD-10-CM | POA: Insufficient documentation

## 2020-08-23 NOTE — Progress Notes (Addendum)
Chief Complaint  Patient presents with  . Suture / Staple Removal   F/u with friend [name] who is former sister in Sports coach and they have remained friends 1. Right forehead suture removal after seen for fall 08/18/20 [facility] did not have LOC no tripping or dizziness just fall  Face still bruised and sore 7 sutures to remove  2. Valve d/o planned PYP procedure in [place] Dr. Roxy Manns and Angelena Form upcoming to see if has amylodosis and discussions of valve replacement    Review of Systems  Respiratory: Negative for shortness of breath.   Cardiovascular: Negative for chest pain.  Musculoskeletal: Positive for falls.  Neurological: Negative for loss of consciousness.   Past Medical History:  Diagnosis Date  . Abnormal mammogram 2007   Recommend close follow up  . Anemia    Iron, b12, SPEP, UPEP normal, 12/2012  . Anxiety   . Aortic stenosis    Severe by [facility] 05/2020  . aortic stenosis   . Arthritis   . Asthma   . CAD (coronary artery disease)    s/p angioplasty 1996  . Chronic cough   . Depression   . Diverticulosis   . Dyspnea   . Elevated LFTs 2010   s/p ultrasound w/ possible fatty liver; GI consult. Resolution 2011  . Gastric ulcer   . GERD (gastroesophageal reflux disease)   . History of colonic polyps    Dr. Vira Agar  . HOH (hard of hearing)   . Hyperlipidemia   . Hypertension   . Peritoneal carcinomatosis [facility]) 2001   Dr. Oliva Bustard & Dr. Claiborne Rigg s/p chemo  . Shingles 2008   T-9 distribution  . Valvular heart disease    Mild to Moderate MR, AS  . Vitamin D deficiency   . Wheezing    Past Surgical History:  Procedure Laterality Date  . ABDOMINAL HYSTERECTOMY  2001  . APPENDECTOMY    . CARDIAC CATHETERIZATION  2007   50% mid LAD stenosis, 70% proximal RCA with 100% distal RCA stenosis with collaterals, EF 65%  . CARDIAC CATHETERIZATION N/A 04/27/2015   Procedure: Left Heart Cath and Coronary Angiography;  Surgeon: Jettie Booze, MD;  Location: [place] [facility];   Service: Cardiovascular;  Laterality: N/A;  . CARDIAC CATHETERIZATION N/A 04/27/2015   Procedure: Coronary Stent Intervention;  Surgeon: Jettie Booze, MD;  Location: [place] [facility];  Service: Cardiovascular;  Laterality: N/A;  . CATARACT EXTRACTION W/PHACO Left 09/25/2016   Procedure: CATARACT EXTRACTION PHACO AND INTRAOCULAR LENS PLACEMENT (IOC);  Surgeon: Eulogio Bear, MD;  Location: [facility];  Service: Ophthalmology;  Laterality: Left;  [place] 46.7 AP% 8.1 CDE 3.80 Fluid pack lot # [id number]  . CATARACT EXTRACTION W/PHACO Right 11/06/2016   Procedure: CATARACT EXTRACTION PHACO AND INTRAOCULAR LENS PLACEMENT ([place]);  Surgeon: Eulogio Bear, MD;  Location: [facility];  Service: Ophthalmology;  Laterality: Right;  [place] 49.5 AP8.7 CDE4.28 LOT # [id number]  . CHOLECYSTECTOMY  1987  . COLECTOMY  2001  . CORONARY ANGIOPLASTY  1996  . CORONARY ANGIOPLASTY    . LAPAROSCOPIC BILATERAL SALPINGO OOPHERECTOMY  1988   Dr. Laverta Baltimore  . LEFT HEART CATH AND CORONARY ANGIOGRAPHY N/A 01/25/2018   Procedure: LEFT HEART CATH AND CORONARY ANGIOGRAPHY;  Surgeon: Wellington Hampshire, MD;  Location: [place] [facility];  Service: Cardiovascular;  Laterality: N/A;  . RIGHT/LEFT HEART CATH AND CORONARY ANGIOGRAPHY N/A 05/16/2020   Procedure: RIGHT/LEFT HEART CATH AND CORONARY ANGIOGRAPHY;  Surgeon: Wellington Hampshire, MD;  Location:  [place] [facility];  Service: Cardiovascular;  Laterality: N/A;  . TONSILLECTOMY  1950   Family History  Problem Relation Age of Onset  . Hypertension Mother   . Stroke Mother 25       Cerebral Hemorrhage  . Cancer Father        bone cancer  . Diabetes Sister   . Diabetes Brother   . Heart disease Brother   . Cancer Daughter 29       Ovarian cancer  . Colon cancer Neg Hx   . Colon polyps Neg Hx    Social History   Socioeconomic History  . Marital status: Widowed    Spouse name: Not on file  . Number of children: 3  . Years of education: Not on file  . Highest education  level: Not on file  Occupational History  . Occupation: [employer]    Comment: Retired  . 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 Smoking status: Never Smoker  . Smokeless tobacco: Never Used  Vaping Use  . Vaping Use: Never used  Substance and Sexual Activity  . Alcohol use: No    Alcohol/week: 0.0 standard drinks  . Drug use: No  . Sexual activity: Never  Other Topics Concern  . Not on file  Social History Narrative   Pt lives in [place] by herself. She is widowed and has a daughter [name]), son [name]). She also had a son that was killed in a work related accident [name] - died age).       Caffeine - none   Exercise - walking   Social Determinants of Health   Financial Resource Strain: Low Risk   . Difficulty of Paying Living Expenses: Not hard at all  Food Insecurity: No Food Insecurity  . Worried About Charity fundraiser in the Last Year: Never true  . Ran Out of Food in the Last Year: Never true  Transportation Needs: No Transportation Needs  . Lack of Transportation (Medical): No  . Lack of Transportation (Non-Medical): No  Physical Activity: Not on file  Stress: No Stress Concern Present  . Feeling of Stress : Not at all  Social Connections: Not on file  Intimate Partner Violence: Not on file   Current Meds  Medication Sig  . acetaminophen (TYLENOL) 500 MG tablet Take 1,000 mg by mouth in the morning and at bedtime.  . Ascorbic Acid (VITAMIN C PO) Take by mouth.  Marland Kitchen aspirin 81 MG tablet Take 81 mg by mouth daily.  Marland Kitchen atorvastatin (LIPITOR) 40 MG tablet Take 1 tablet (40 mg total) by mouth daily.  . Cholecalciferol 5000 units TABS Take 5,000 Units by mouth daily.  . clopidogrel (PLAVIX) 75 MG tablet TAKE 1 TABLET BY MOUTH DAILY WITH BREAKFAST  . isosorbide mononitrate (IMDUR) 30 MG 24 hr tablet Take 1.5 tablets (45 mg total) by mouth daily.  Marland Kitchen loperamide (IMODIUM A-D) 2 MG tablet Take 2 mg by mouth 4 (four) times daily as  needed for diarrhea or loose stools.  . metoprolol tartrate (LOPRESSOR) 50 MG tablet Take 50 mg by mouth 2 (two) times daily.  . mirtazapine (REMERON) 45 MG tablet TAKE 1 TABLET BY MOUTH EVERY NIGHT AT BEDTIME  . Multiple Vitamin (MULTIVITAMIN) capsule Take 1 capsule by mouth daily.  . Nutritional Supplements (OSTEO ADVANCE) TABS Take 1 tablet by mouth daily.   Marland Kitchen omega-3 acid ethyl esters (LOVAZA) 1 g capsule TAKE 2 CAPSULES BY MOUTH  TWICE DAILY  . pantoprazole (PROTONIX) 40 MG tablet TAKE 1 TABLET(40 MG) BY MOUTH DAILY  . Ubiquinol 100 MG CAPS Take 100 mg by mouth every morning.  . vitamin B-12 (CYANOCOBALAMIN) 250 MCG tablet Take 250 mcg by mouth daily.   Allergies  Allergen Reactions  . Abilify [Aripiprazole] Other (See Comments)    Burning in Chest Elevated b/p Insomnia  . Celexa [Citalopram]     Caused low sodium level.   . Ibuprofen Other (See Comments)    GIB   Recent Results (from the past 2160 hour(s))  Basic metabolic panel     Status: Abnormal   Collection Time: 06/05/20  2:13 PM  Result Value Ref Range   Sodium 134 (L) 135 - 145 mmol/L   Potassium 3.4 (L) 3.5 - 5.1 mmol/L   Chloride 97 (L) 98 - 111 mmol/L   CO2 23 22 - 32 mmol/L   Glucose, Bld 133 (H) 70 - 99 mg/dL    Comment: Glucose reference range applies only to samples taken after fasting for at least 8 hours.   BUN 14 8 - 23 mg/dL   Creatinine, Ser 0.76 0.44 - 1.00 mg/dL   Calcium 8.9 8.9 - 10.3 mg/dL   GFR, Estimated >60 >60 mL/min    Comment: (NOTE) Calculated using the CKD-EPI Creatinine Equation (2021)    Anion gap 14 5 - 15    Comment: Performed at [facility], [address] [place], [place] [address]  CBC     Status: Abnormal   Collection Time: 06/05/20  2:13 PM  Result Value Ref Range   WBC 10.9 (H) 4.0 - 10.5 K/uL   RBC 3.97 3.87 - 5.11 MIL/uL   Hemoglobin 12.4 12.0 - 15.0 g/dL   HCT 36.2 36.0 - 46.0 %   MCV 91.2 80.0 - 100.0 fL   MCH 31.2 26.0 - 34.0 pg   MCHC 34.3 30.0 - 36.0  g/dL   RDW 11.9 11.5 - 15.5 %   Platelets 222 150 - 400 K/uL   nRBC 0.0 0.0 - 0.2 %    Comment: Performed at [facility], [address] [place], [place] [address]  Troponin I (High Sensitivity)     Status: Abnormal   Collection Time: 06/05/20  2:13 PM  Result Value Ref Range   Troponin I (High Sensitivity) 25 (H) <18 ng/L    Comment: (NOTE) Elevated high sensitivity troponin I (hsTnI) values and significant  changes across serial measurements may suggest ACS but many other  chronic and acute conditions are known to elevate hsTnI results.  Refer to the "Links" section for chest pain algorithms and additional  guidance. Performed at [facility], [place], [place] [address]   Troponin I (High Sensitivity)     Status: Abnormal   Collection Time: 06/05/20  4:46 PM  Result Value Ref Range   Troponin I (High Sensitivity) 20 (H) <18 ng/L    Comment: (NOTE) Elevated high sensitivity troponin I (hsTnI) values and significant  changes across serial measurements may suggest ACS but many other  chronic and acute conditions are known to elevate hsTnI results.  Refer to the "Links" section for chest pain algorithms and additional  guidance. Performed at [facility], [address] [place], [place] [address]   Respiratory Panel by RT PCR (Flu A&B, Covid) - Nasopharyngeal Swab     Status: None   Collection Time: 06/05/20  5:31 PM   Specimen: Nasopharyngeal Swab  Result Value  Ref Range   SARS Coronavirus 2 by RT PCR NEGATIVE NEGATIVE    Comment: (NOTE) SARS-CoV-2 target nucleic acids are NOT DETECTED.  The SARS-CoV-2 RNA is generally detectable in upper respiratoy specimens during the acute phase of infection. The lowest concentration of SARS-CoV-2 viral copies this assay can detect is 131 copies/mL. A negative result does not preclude SARS-Cov-2 infection and should not be used as the sole basis for treatment or other patient management  decisions. A negative result may occur with  improper specimen collection/handling, submission of specimen other than nasopharyngeal swab, presence of viral mutation(s) within the areas targeted by this assay, and inadequate number of viral copies (<131 copies/mL). A negative result must be combined with clinical observations, patient history, and epidemiological information. The expected result is Negative.  Fact Sheet for Patients:  PinkCheek.be  Fact Sheet for Healthcare Providers:  GravelBags.it  This test is no t yet approved or cleared by the [place] FDA and  has been authorized for detection and/or diagnosis of SARS-CoV-2 by FDA under an Emergency Use Authorization (EUA). This EUA will remain  in effect (meaning this test can be used) for the duration of the COVID-19 declaration under Section 564(b)(1) of the Act, 21 U.S.C. section 360bbb-3(b)(1), unless the authorization is terminated or revoked sooner.     Influenza A by PCR NEGATIVE NEGATIVE   Influenza B by PCR NEGATIVE NEGATIVE    Comment: (NOTE) The Xpert Xpress SARS-CoV-2/FLU/RSV assay is intended as an aid in  the diagnosis of influenza from Nasopharyngeal swab specimens and  should not be used as a sole basis for treatment. Nasal washings and  aspirates are unacceptable for Xpert Xpress SARS-CoV-2/FLU/RSV  testing.  Fact Sheet for Patients: PinkCheek.be  Fact Sheet for Healthcare Providers: GravelBags.it  This test is not yet approved or cleared by the [place] FDA and  has been authorized for detection and/or diagnosis of SARS-CoV-2 by  FDA under an Emergency Use Authorization (EUA). This EUA will remain  in effect (meaning this test can be used) for the duration of the  Covid-19 declaration under Section 564(b)(1) of the Act, 21  U.S.C. section 360bbb-3(b)(1), unless the  authorization is  terminated or revoked. Performed at [facility], [address] [place], [place] [address]   Hemoglobin A1c     Status: Abnormal   Collection Time: 06/06/20  5:52 AM  Result Value Ref Range   Hgb A1c MFr Bld 6.0 (H) 4.8 - 5.6 %    Comment: (NOTE) Pre diabetes:          5.7%-6.4%  Diabetes:              >6.4%  Glycemic control for   <7.0% adults with diabetes    Mean Plasma Glucose 125.5 mg/dL    Comment: Performed at [facility] [address] [place], [place] [address]  Lipid panel     Status: None   Collection Time: 06/06/20  5:52 AM  Result Value Ref Range   Cholesterol 95 0 - 200 mg/dL   Triglycerides 68 <150 mg/dL   HDL 41 >40 mg/dL   Total CHOL/HDL Ratio 2.3 RATIO   VLDL 14 0 - 40 mg/dL   LDL Cholesterol 40 0 - 99 mg/dL    Comment:        Total Cholesterol/HDL:CHD Risk Coronary Heart Disease Risk Table                     Men  Women  1/2 Average Risk   3.4   3.3  Average Risk       5.0   4.4  2 X Average Risk   9.6   7.1  3 X Average Risk  23.4   11.0        Use the calculated Patient Ratio above and the CHD Risk Table to determine the patient's CHD Risk.        ATP III CLASSIFICATION (LDL):  <100     mg/dL   Optimal  100-129  mg/dL   Near or Above                    Optimal  130-159  mg/dL   Borderline  160-189  mg/dL   High  >190     mg/dL   Very High Performed at [facility], [address] [place], [place] [address]   TSH     Status: None   Collection Time: 06/06/20  5:52 AM  Result Value Ref Range   TSH 2.509 0.350 - 4.500 uIU/mL    Comment: Performed by a 3rd Generation assay with a functional sensitivity of <=0.01 uIU/mL. Performed at [facility], [address] [place], [place] [address]   Methylmalonic acid, serum     Status: None   Collection Time: 06/06/20  5:52 AM  Result Value Ref Range   Methylmalonic Acid, Quantitative 64 0 - 378 nmol/L   Disclaimer: Comment     Comment:  (NOTE) This test was developed and its performance characteristics determined by [facility]. It has not been cleared or approved by the Food and Drug Administration. Performed At: [facility] [place], [place] [id number] Rush Farmer MD [id number]   Vitamin B12     Status: None   Collection Time: 06/06/20  5:52 AM  Result Value Ref Range   Vitamin B-12 617 180 - 914 pg/mL    Comment: (NOTE) This assay is not validated for testing neonatal or myeloproliferative syndrome specimens for Vitamin B12 levels. Performed at [facility], [address] [place], [place] [address]   25-Hydroxy vitamin D Lcms D2+D3     Status: None   Collection Time: 06/06/20  5:52 AM  Result Value Ref Range   25-Hydroxy, Vitamin D 37 ng/mL    Comment: (NOTE) Reference Range: All Ages: Target levels 30 - 100    25-Hydroxy, Vitamin D-2 <1.0 ng/mL    Comment: (NOTE) This test was developed and its performance characteristics determined by [facility]. It has not been cleared or approved by the Food and Drug Administration.    25-Hydroxy, Vitamin D-3 37 ng/mL    Comment: (NOTE) This test was developed and its performance characteristics determined by [facility]. It has not been cleared or approved by the Food and Drug Administration. Performed At: [place], [place] [id number] Martha Clan MD [id number]   CBC with Differential     Status: Abnormal   Collection Time: 07/01/20  4:17 PM  Result Value Ref Range   WBC 7.7 4.0 - 10.5 K/uL   RBC 3.96 3.87 - 5.11 MIL/uL   Hemoglobin 12.6 12.0 - 15.0 g/dL   HCT 35.9 (L) 36.0 - 46.0 %   MCV 90.7 80.0 - 100.0 fL   MCH 31.8 26.0 - 34.0 pg   MCHC 35.1 30.0 - 36.0 g/dL   RDW 11.9 11.5 - 15.5 %   Platelets 199 150 - 400  K/uL   nRBC 0.0 0.0 - 0.2 %   Neutrophils Relative % 64 %   Neutro Abs 4.9 1.7 - 7.7 K/uL   Lymphocytes Relative 29 %   Lymphs Abs 2.2 0.7 - 4.0 K/uL   Monocytes Relative  6 %   Monocytes Absolute 0.5 0.1 - 1.0 K/uL   Eosinophils Relative 1 %   Eosinophils Absolute 0.0 0.0 - 0.5 K/uL   Basophils Relative 0 %   Basophils Absolute 0.0 0.0 - 0.1 K/uL   Immature Granulocytes 0 %   Abs Immature Granulocytes 0.02 0.00 - 0.07 K/uL    Comment: Performed at [facility], [address] [place], [place] [address]  Comprehensive metabolic panel     Status: Abnormal   Collection Time: 07/01/20  4:17 PM  Result Value Ref Range   Sodium 133 (L) 135 - 145 mmol/L   Potassium 3.6 3.5 - 5.1 mmol/L   Chloride 95 (L) 98 - 111 mmol/L   CO2 27 22 - 32 mmol/L   Glucose, Bld 121 (H) 70 - 99 mg/dL    Comment: Glucose reference range applies only to samples taken after fasting for at least 8 hours.   BUN 10 8 - 23 mg/dL   Creatinine, Ser 0.63 0.44 - 1.00 mg/dL   Calcium 9.4 8.9 - 10.3 mg/dL   Total Protein 7.8 6.5 - 8.1 g/dL   Albumin 4.5 3.5 - 5.0 g/dL   AST 33 15 - 41 U/L   ALT 23 0 - 44 U/L   Alkaline Phosphatase 46 38 - 126 U/L   Total Bilirubin 1.0 0.3 - 1.2 mg/dL   GFR, Estimated >60 >60 mL/min    Comment: (NOTE) Calculated using the CKD-EPI Creatinine Equation (2021)    Anion gap 11 5 - 15    Comment: Performed at [facility], [address] [place], [place] [address]  Troponin I (High Sensitivity)     Status: Abnormal   Collection Time: 07/01/20  4:17 PM  Result Value Ref Range   Troponin I (High Sensitivity) 28 (H) <18 ng/L    Comment: (NOTE) Elevated high sensitivity troponin I (hsTnI) values and significant  changes across serial measurements may suggest ACS but many other  chronic and acute conditions are known to elevate hsTnI results.  Refer to the "Links" section for chest pain algorithms and additional  guidance. Performed at [facility], [address] [place], [place] [address]   Troponin I (High Sensitivity)     Status: Abnormal   Collection Time: 07/01/20  8:18 PM  Result Value Ref Range   Troponin I (High  Sensitivity) 31 (H) <18 ng/L    Comment: (NOTE) Elevated high sensitivity troponin I (hsTnI) values and significant  changes across serial measurements may suggest ACS but many other  chronic and acute conditions are known to elevate hsTnI results.  Refer to the "Links" section for chest pain algorithms and additional  guidance. Performed at [facility], [address] [place], [place] [address]   Basic metabolic panel     Status: Abnormal   Collection Time: 07/18/20 12:05 PM  Result Value Ref Range   Glucose 110 (H) 65 - 99 mg/dL   BUN 12 8 - 27 mg/dL   Creatinine, Ser 0.76 0.57 - 1.00 mg/dL   GFR calc non Af Amer 73 >59 mL/min/1.73   GFR calc Af Amer 84 >59 mL/min/1.73    Comment: **In accordance with recommendations from the NKF-ASN Task force,**  [facility] is in the process of updating its eGFR calculation to the   2021 CKD-EPI creatinine equation that estimates kidney function   without a race variable.    BUN/Creatinine Ratio 16 12 - 28   Sodium 138 134 - 144 mmol/L   Potassium 4.2 3.5 - 5.2 mmol/L   Chloride 102 96 - 106 mmol/L   CO2 20 20 - 29 mmol/L   Calcium 8.8 8.7 - 10.3 mg/dL   Objective  Body mass index is 25.41 kg/m. Wt Readings from Last 3 Encounters:  08/23/20 125 lb 12.8 oz (57.1 kg)  08/20/20 126 lb 9.6 oz (57.4 kg)  08/18/20 125 lb (56.7 kg)   Temp Readings from Last 3 Encounters:  08/23/20 (!) 97.5 F (36.4 C) (Oral)  08/20/20 98 F (36.7 C)  08/18/20 99 F (37.2 C) (Oral)   BP Readings from Last 3 Encounters:  08/23/20 138/70  08/20/20 (!) 142/80  08/18/20 (!) 189/103   Pulse Readings from Last 3 Encounters:  08/23/20 83  08/20/20 75  08/18/20 (!) 110    Physical Exam Vitals and nursing note reviewed.  Constitutional:      Appearance: Normal appearance. She is well-developed and well-groomed.  HENT:     Head: Normocephalic and atraumatic.  Eyes:     Conjunctiva/sclera: Conjunctivae normal.     Pupils: Pupils are equal,  round, and reactive to light.  Cardiovascular:     Rate and Rhythm: Normal rate and regular rhythm.     Heart sounds: Murmur heard.    Pulmonary:     Effort: Pulmonary effort is normal.     Breath sounds: Normal breath sounds.  Skin:      Neurological:     General: No focal deficit present.     Mental Status: She is alert and oriented to person, place, and time. Mental status is at baseline.     Gait: Gait normal.  Psychiatric:        Attention and Perception: Attention and perception normal.        Mood and Affect: Mood and affect normal.        Speech: Speech normal.        Behavior: Behavior normal. Behavior is cooperative.        Thought Content: Thought content normal.        Cognition and Memory: Cognition and memory normal.        Judgment: Judgment normal.     Assessment  Plan  Visit for suture removal 7 sutures removed right forehead after fall  rec vasoline and steri strips and vitamin E   Heart valve disease  Pending PYP test cards in future and considering valve replacement has MR, AR, and AS  F/u leb cards in [place] PCP in 2-3 months  Provider: Dr. Olivia Mackie McLean-Scocuzza-Internal Medicine

## 2020-08-23 NOTE — Patient Instructions (Signed)
Aortic Valve Stenosis  Aortic valve stenosis is a narrowing of the aortic valve in the heart. The aortic valve opens and closes to regulate blood flow between the left side of the heart (left ventricle) and the artery that leads away from the heart (aorta). When the aortic valve becomes narrow, it is difficult for the heart to pump blood out to the body, which causes the heart to work harder. The extra work can weaken the heart muscle over time. Aortic valve stenosis can range from mild to severe. If it is not treated, it can become more severe over time and lead to heart failure. What are the causes? This condition may be caused by:  Buildup of calcium around and on the aortic valve. This can occur with aging. This is the most common cause of aortic valve stenosis.  A heart problem that developed in the womb (birth defect).  Rheumatic fever.  Radiation to the chest. What increases the risk? You may be more likely to develop this condition if:  You are older than age 41.  You were born with an abnormal bicuspid valve. What are the signs or symptoms? You may not have any symptoms until your condition becomes severe. It may take 10-20 years for mild or moderate aortic valve stenosis to become severe. Symptoms may include:  Shortness of breath. This may get worse during physical activity.  Feeling unusually weak and tired (fatigue).  Extreme discomfort in the chest, neck, or arm during physical activity (angina).  A heartbeat that is irregular or faster than normal (palpitations).  Dizziness or fainting. This may happen when you get physically tired or after you take certain heart medicines, such as nitroglycerin. How is this diagnosed? This condition may be diagnosed with:  A physical exam.  Echocardiogram. This is a type of imaging test that uses sound waves (ultrasound) to make images of your heart. There are two kinds of this test that may be used. ? Transthoracic  echocardiogram (TTE). For this type, a wand-like tool (transducer) is moved over your chest to create ultrasound images that are recorded by a computer. ? Transesophageal echocardiogram (TEE). For this type, a flexible tube (probe) is inserted down the part of the body that moves food from your mouth to your stomach (esophagus). The heart and the esophagus are close to each other. Your health care provider will use the probe to take clear, detailed pictures of the heart.  Cardiac catheterization. For this procedure, a small, thin tube (catheter) is passed through a large vein in your neck, groin, or arm. The catheter is used to get information about arteries, structures, blood pressure, and oxygen levels in your heart.  Stress tests. These are tests that evaluate the blood supply to your heart and your heart's response to exercise. You may work with a health care provider who specializes in the heart (cardiologist) for diagnosis and treatment. How is this treated? Treatment depends on how severe your condition is and what your symptoms are. You will need to have your heart checked regularly to make sure that your condition is not getting worse or causing serious problems. Treatment may also include:  Surgery to replace your aortic valve. This is the most common treatment for aortic valve stenosis, and it is the only treatment to cure the condition. Several types of surgeries are available. The surgery may be done: ? Through a large incision over your heart (open-heart surgery). ? Through small incisions, using a flexible tube called a catheter (  transcatheter aortic valve replacement, TAVR).  Medicines that help to keep your heart rate regular.  Medicines that thin your blood (anticoagulants) to prevent blood clots.  Antibiotic medicines to help prevent infection. If your condition is mild, you may only need regular follow-up visits for monitoring. Follow these instructions at  home: Lifestyle  Limit alcohol intake to no more than 1 drink a day for nonpregnant women and 2 drinks a day for men. One drink equals 12 oz of beer, 5 oz of wine, or 1 oz of hard liquor.  Do not use any products that contain nicotine or tobacco, such as cigarettes and e-cigarettes. If you need help quitting, ask your health care provider.  Work with your health care provider to manage your blood pressure and cholesterol.  Maintain a healthy weight. Eating and drinking  Eat a heart-healthy diet that includes plenty of fresh fruits and vegetables, whole grains, lean protein, and low-fat or nonfat dairy.  Limit how much caffeine you drink. Caffeine can affect your heart's rate and rhythm.  Avoid foods that are: ? High in salt (sodium), saturated fat, or sugar. ? Canned or highly processed. ? Fried.  Follow instructions from your health care provider about any other eating or drinking restrictions.   Activity  Exercise regularly and return to your normal activities as told by your health care provider. Ask your health care provider what amount and type of physical activity is safe for you. ? If your aortic valve stenosis is mild, you may only need to avoid very intense physical activity, such as heavy weight lifting. ? The more severe your aortic valve stenosis is, the more activities you may need to avoid. If you are taking blood thinners:  Before you take any medicines that contain aspirin or NSAIDs, talk with your health care provider. These medicines increase your risk for dangerous bleeding.  Take your medicine exactly as told, at the same time every day.  Avoid activities that could cause injury or bruising, and follow instructions about how to prevent falls.  Wear a medical alert bracelet or carry a card that lists what medicines you take. General instructions  Take over-the-counter and prescription medicines only as told by your health care provider.  If you were  prescribed an antibiotic, take it as told by your health care provider. Do not stop taking the antibiotic even if you start to feel better.  If you are a woman and you plan to become pregnant, talk with your health care provider before you become pregnant.  Before you have any type of medical or dental procedure or surgery, tell all health care providers that you have aortic valve stenosis. This may affect the treatment that you receive.  Keep all follow-up visits as told by your health care provider. This is important. Contact a health care provider if:  You have a fever. Get help right away if:  You develop any of the following symptoms: ? Chest pain. ? Chest tightness. ? Shortness of breath. ? Trouble breathing.  You feel light-headed.  You feel like you might faint.  Your heartbeat is irregular or faster than normal. These symptoms may represent a serious problem that is an emergency. Do not wait to see if the symptoms will go away. Get medical help right away. Call your local emergency services (911 in the [place] Do not drive yourself to the hospital. Summary  Aortic valve stenosis is a narrowing of the aortic valve in the heart. The aortic valve opens  and closes to regulate blood flow between the left side of the heart (left ventricle) and the artery that leads away from the heart (aorta).  Aortic valve stenosis can range from mild to severe. If it is not treated, it can become more severe over time and lead to heart failure.  Treatment depends on how severe your condition is and what your symptoms are. You will need to have your heart checked regularly to make sure that your condition is not getting worse or causing serious problems.  Exercise regularly and return to your normal activities as told by your health care provider. Ask your health care provider what amount and type of physical activity is safe for you. This information is not intended to replace advice given to you  by your health care provider. Make sure you discuss any questions you have with your health care provider. Document Revised: 12/28/2019 Document Reviewed: 12/28/2019 Elsevier Patient Education  2021 [place].  Aortic Valve Regurgitation  Aortic valve regurgitation is a condition that happens when the aortic valve does not close all the way. The aortic valve is a gate-like structure between the lower left chamber of the heart (left ventricle) and the main blood vessel that supplies blood to the rest of the body (aorta). The aortic valve opens when the left ventricle squeezes to pump blood into the aorta, and it closes when the left ventricle relaxes. In aortic valve regurgitation, which may also be called aortic insufficiency, blood in the aorta leaks through the aortic valve after it has closed. This causes the heart to work harder than usual. If aortic valve regurgitation is not treated, it causes enlargement and weakening of the left ventricle. This can result in heart failure, abnormal heart rhythms (arrhythmias), and other dangerous conditions. If this condition develops suddenly, it may need to be treated with emergency surgery. What are the causes? This condition may be caused by anything that weakens the aortic valve, such as:  Severe high blood pressure (hypertension).  Infection of the inner lining of the heart or the heart valves (endocarditis).  A ballooning of a weak spot in the aorta wall (aortic aneurysm).  A tear or separation of the inner walls of the aorta (aortic dissection).  Injury (trauma) that damages the aortic valve.  Certain medicines.  Disease of a protein in the body called collagen (collagen vascular disease).  A heart problem (bicuspid aortic valve) that is present at birth (congenital).  An inflammatory condition that can develop after an untreated strep throat infection (rheumatic fever).  Complications during or after a heart surgery. This is  rare. What are the signs or symptoms? Symptoms of this condition include:  Fatigue.  Shortness of breath.  Difficulty breathing while lying flat (orthopnea). You may need to sleep on two or more pillows to breathe better.  Chest discomfort (angina).  Head bobbing.  A fluttering feeling in the chest (palpitations).  An irregular or faster-than-normal heartbeat. Symptoms usually develop gradually, unless this condition was caused by a major injury or by endocarditis. How is this diagnosed? This condition is diagnosed based on:  A physical exam.  An imaging test that uses sound waves to produce images of the heart (echocardiogram). You may also have other tests to confirm the diagnosis, including:  Chest X-ray.  MRI.  A test that records the electrical impulses of the heart (electrocardiogram, ECG).  CT angiogram (CTA). In this procedure, a large X-ray machine, called a CT scanner, takes detailed pictures of blood  vessels after dye has been injected into the vessels.  Aortic angiogram. In this procedure, X-ray images are taken after dye has been injected into blood vessels. This tests the function of the aorta. How is this treated? Treatment depends on your symptoms, how severe the condition is, and what problems the condition is causing. Treatment may include:  Observation. If your condition is mild, you may not need treatment. However, you will need to have your condition checked regularly to make sure it is not getting worse or causing serious problems.  Medicines that help the heart work more efficiently.  Surgery to repair or replace the valve, in severe cases. Surgery is usually recommended if the left ventricle enlarges beyond a certain point. If aortic valve regurgitation occurs suddenly, surgery may be needed immediately. Follow these instructions at home:  Take over-the-counter and prescription medicines only as told by your health care provider.  Do not use any  products that contain nicotine or tobacco, such as cigarettes, e-cigarettes, and chewing tobacco. If you need help quitting, ask your health care provider.  If directed by your health care provider, avoid heavy weight lifting and contact sports such as football.  Follow instructions from your health care provider about eating or drinking restrictions. Your health care provider may recommend that you: ? Limit alcohol use to:  0-1 drink a day for women.  0-2 drinks a day for men. ? Be aware of how much alcohol is in your drink. In the [place] one drink equals one 12 oz bottle of beer (355 mL), one 5 oz glass of wine (148 mL), or one 1 oz glass of hard liquor (44 mL). ? Eat foods that are high in fiber, such as fresh fruits and vegetables, whole grains, and beans. ? Eat less salt (sodium) and salty foods. Check ingredients and nutrition facts on packaged foods and beverages.  Keep all follow-up visits as told by your health care provider. This is important. You may need regular tests to monitor your condition and check how well your heart is pumping blood. Contact a health care provider if:  Your angina symptoms are more frequent or seem to be getting worse.  Your breathing problems seem to be getting worse.  You feel dizzy or close to fainting.  You have swelling in your feet, ankles, legs, or abdomen.  You urinate more than usual during the night (nocturia).  You have an unexplained fever that lasts 2 days or longer.  You develop new symptoms. Get help right away if you:  Have severe chest pain.  Have severe shortness of breath.  Feel rapid or irregular heartbeats.  Feel light-headed or you faint.  Have sudden, unexplained weight gain. Summary  Aortic valve regurgitation is a condition in which the aortic valve does not close all the way. This causes the heart to work harder than usual.  This condition may be treated with observation, medicines, or surgery.  Take  over-the-counter and prescription medicines only as told by your health care provider.  Eat less salt (sodium) and salty foods. Check ingredients and nutrition facts on packaged foods and beverages.  Get help right away if you have severe chest pain, shortness of breath, irregular heartbeats, sudden weight gain, or if you feel light-headed or you faint. This information is not intended to replace advice given to you by your health care provider. Make sure you discuss any questions you have with your health care provider. Document Revised: 04/20/2019 Document Reviewed: 04/06/2018 Elsevier Patient Education  [place] Prevention in the Home, Adult Falls can cause injuries and can affect people from all age groups. There are many simple things that you can do to make your home safe and to help prevent falls. Ask for help when making these changes, if needed. What actions can I take to prevent falls? General instructions  Use good lighting in all rooms. Replace any light bulbs that burn out.  Turn on lights if it is dark. Use night-lights.  Place frequently used items in easy-to-reach places. Lower the shelves around your home if necessary.  Set up furniture so that there are clear paths around it. Avoid moving your furniture around.  Remove throw rugs and other tripping hazards from the floor.  Avoid walking on wet floors.  Fix any uneven floor surfaces.  Add color or contrast paint or tape to grab bars and handrails in your home. Place contrasting color strips on the first and last steps of stairways.  When you use a stepladder, make sure that it is completely opened and that the sides are firmly locked. Have someone hold the ladder while you are using it. Do not climb a closed stepladder.  Be aware of any and all pets. What can I do in the bathroom?  Keep the floor dry. Immediately clean up any water that spills onto the floor.  Remove soap buildup in the tub or  shower on a regular basis.  Use non-skid mats or decals on the floor of the tub or shower.  Attach bath mats securely with double-sided, non-slip rug tape.  If you need to sit down while you are in the shower, use a plastic, non-slip stool.  Install grab bars by the toilet and in the tub and shower. Do not use towel bars as grab bars.      What can I do in the bedroom?  Make sure that a bedside light is easy to reach.  Do not use oversized bedding that drapes onto the floor.  Have a firm chair that has side arms to use for getting dressed. What can I do in the kitchen?  Clean up any spills right away.  If you need to reach for something above you, use a sturdy step stool that has a grab bar.  Keep electrical cables out of the way.  Do not use floor polish or wax that makes floors slippery. If you must use wax, make sure that it is non-skid floor wax. What can I do in the stairways?  Do not leave any items on the stairs.  Make sure that you have a light switch at the top of the stairs and the bottom of the stairs. Have them installed if you do not have them.  Make sure that there are handrails on both sides of the stairs. Fix handrails that are broken or loose. Make sure that handrails are as long as the stairways.  Install non-slip stair treads on all stairs in your home.  Avoid having throw rugs at the top or bottom of stairways, or secure the rugs with carpet tape to prevent them from moving.  Choose a carpet design that does not hide the edge of steps on the stairway.  Check any carpeting to make sure that it is firmly attached to the stairs. Fix any carpet that is loose or worn. What can I do on the outside of my home?  Use bright outdoor lighting.  Regularly repair the edges of walkways and driveways  and fix any cracks.  Remove high doorway thresholds.  Trim any shrubbery on the main path into your home.  Regularly check that handrails are securely fastened  and in good repair. Both sides of any steps should have handrails.  Install guardrails along the edges of any raised decks or porches.  Clear walkways of debris and clutter, including tools and rocks.  Have leaves, snow, and ice cleared regularly.  Use sand or salt on walkways during winter months.  In the garage, clean up any spills right away, including grease or oil spills. What other actions can I take?  Wear closed-toe shoes that fit well and support your feet. Wear shoes that have rubber soles or low heels.  Use mobility aids as needed, such as canes, walkers, scooters, and crutches.  Review your medicines with your health care provider. Some medicines can cause dizziness or changes in blood pressure, which increase your risk of falling. Talk with your health care provider about other ways that you can decrease your risk of falls. This may include working with a physical therapist or trainer to improve your strength, balance, and endurance. Where to find more information  Centers for Disease Control and Prevention, [facility]: WebmailGuide.co.za  [employer] on Aging: BrainJudge.co.uk Contact a health care provider if:  You are afraid of falling at home.  You feel weak, drowsy, or dizzy at home.  You fall at home. Summary  There are many simple things that you can do to make your home safe and to help prevent falls.  Ways to make your home safe include removing tripping hazards and installing grab bars in the bathroom.  Ask for help when making these changes in your home. This information is not intended to replace advice given to you by your health care provider. Make sure you discuss any questions you have with your health care provider. Document Revised: 06/26/2017 Document Reviewed: 02/26/2017 Elsevier Patient Education  2021 [place] and Fractures  Falls can be very serious, especially for older adults or people with  osteoporosis  Falls can be caused by:  Tripping or slipping  Slow reflexes  Balance problems  Reduced muscle strength  Poor vision or a recent change in prescription  Illness and some medications (especially blood pressure pills, diuretics, heart medicines, muscle relaxants and sleep medications)  Drinking alcohol  To prevent falls outdoors:  Use a can or walker if needed  Wear rubber-soled shoes so you don't slip  DO NOT buy "shape up" shoes with rocker bottom soles if you have balance problems.  The thick soles and shape make it more difficult to keep your balance.  Put kitty litter or salt on icy sidewalks  Walk on the grass if the sidewalks are slick  Avoid walking on uneven ground whenever possible  T prevent falls indoors:  Keep rooms clutter-free, especially hallways, stairs and paths to light switches  Remove throw rugs  Install night lights, especially to and in the bathroom  Turn on lights before going downstairs  Keep a flashlight next to your bed  Buy a cordless phone to keep with you instead of jumping up to answer the phone  Install grab bars in the bathroom near the shower and toilet  Install rails on both sides of the stairs.  Make sure the stairs are well lit  Wear slippers with non-skid soles.  Do not walk around in stockings or socks  Balance problems and dizziness are not a normal part of  growing older.  If you begin having balance problems or dizziness see your doctor.  Physical Therapy can help you with many balance problems, strengthening hip and leg muscles and with gait training.  To keep your bones healthy make sure you are getting enough calcium and Vitamin D each day.  Ask your doctor or pharmacist about supplements.  Regular weight-bearing exercise like walking, lifting weights or dancing can help strengthen bones and prevent osteoporosis.

## 2020-08-23 NOTE — Addendum Note (Signed)
Addended by: Thressa Sheller on: 08/23/2020 01:33 PM   Modules accepted: Orders

## 2020-08-30 ENCOUNTER — Encounter (HOSPITAL_COMMUNITY)
Admission: RE | Admit: 2020-08-30 | Discharge: 2020-08-30 | Disposition: A | Discharge status: Home / Self Care | Payer: Medicare Other | Source: Ambulatory Visit | Attending: Physician Assistant | Admitting: Physician Assistant

## 2020-08-30 ENCOUNTER — Other Ambulatory Visit: Payer: Self-pay

## 2020-08-30 ENCOUNTER — Ambulatory Visit (HOSPITAL_COMMUNITY)
Admission: RE | Admit: 2020-08-30 | Discharge: 2020-08-30 | Disposition: A | Discharge status: Home / Self Care | Payer: Medicare Other | Source: Ambulatory Visit | Attending: Physician Assistant | Admitting: Physician Assistant

## 2020-08-30 DIAGNOSIS — I517 Cardiomegaly: Secondary | ICD-10-CM | POA: Diagnosis not present

## 2020-08-30 DIAGNOSIS — I350 Nonrheumatic aortic (valve) stenosis: Secondary | ICD-10-CM | POA: Insufficient documentation

## 2020-08-30 DIAGNOSIS — I509 Heart failure, unspecified: Secondary | ICD-10-CM | POA: Diagnosis not present

## 2020-08-30 MED ORDER — TECHNETIUM TC 99M PYROPHOSPHATE
21.8000 | Freq: Once | INTRAVENOUS | Status: AC | PRN
  Administered 2020-08-30: 21.8 via INTRAVENOUS
  Filled 2020-08-30: qty 22

## 2020-08-30 NOTE — Progress Notes (Signed)
Scan not c/w TTR amyloid.

## 2020-09-04 ENCOUNTER — Telehealth: Payer: Self-pay

## 2020-09-04 NOTE — Telephone Encounter (Signed)
  [facility]   MULTIDISCIPLINARY HEART VALVE TEAM  The pt's case was reviewed by the Multidisciplinary Heart Valve Team this morning. The pt underwent PYP scan 2/3 and this was not consistent with TTR amyloid.  The team felt the pt has Moderate Aortic Stenosis and will require continued evaluation with her primary cardiologist, Dr Fletcher Anon. The team also has concerns about the pt's memory loss issues and if AS progresses to severe we will need to have a discussion about potential worsening of memory loss due to anesthesia use with TAVR. I spoke with the pt's sister, Marsa Aris, and made her aware of the teams recommendation.  Per [name] the pt is happy to not have any upcoming appointments on her calender because she has had a number of tests performed over the past few months.  She also states that the pt told her last week that she feels like she is declining and feels like her time is limited.  At this time the pt will keep her scheduled appointment with Dr Fletcher Anon on 4/22. [name] verbalized understanding of plan and will contact me with any additional questions or concerns.

## 2020-09-04 NOTE — Telephone Encounter (Signed)
Thanks Lauren!

## 2020-09-07 ENCOUNTER — Other Ambulatory Visit: Payer: Self-pay | Admitting: Cardiovascular Disease

## 2020-09-07 DIAGNOSIS — I10 Essential (primary) hypertension: Secondary | ICD-10-CM

## 2020-09-11 ENCOUNTER — Emergency Department
Admission: EM | Admit: 2020-09-11 | Discharge: 2020-09-12 | Disposition: A | Discharge status: Home / Self Care | Payer: Medicare Other | Attending: Emergency Medicine | Admitting: Emergency Medicine

## 2020-09-11 ENCOUNTER — Emergency Department: Payer: Medicare Other

## 2020-09-11 ENCOUNTER — Other Ambulatory Visit: Payer: Self-pay

## 2020-09-11 DIAGNOSIS — N390 Urinary tract infection, site not specified: Secondary | ICD-10-CM | POA: Diagnosis not present

## 2020-09-11 DIAGNOSIS — Z7902 Long term (current) use of antithrombotics/antiplatelets: Secondary | ICD-10-CM | POA: Insufficient documentation

## 2020-09-11 DIAGNOSIS — Z8589 Personal history of malignant neoplasm of other organs and systems: Secondary | ICD-10-CM | POA: Diagnosis not present

## 2020-09-11 DIAGNOSIS — I10 Essential (primary) hypertension: Secondary | ICD-10-CM | POA: Diagnosis not present

## 2020-09-11 DIAGNOSIS — Z7982 Long term (current) use of aspirin: Secondary | ICD-10-CM | POA: Insufficient documentation

## 2020-09-11 DIAGNOSIS — J45909 Unspecified asthma, uncomplicated: Secondary | ICD-10-CM | POA: Insufficient documentation

## 2020-09-11 DIAGNOSIS — Z20822 Contact with and (suspected) exposure to covid-19: Secondary | ICD-10-CM | POA: Insufficient documentation

## 2020-09-11 DIAGNOSIS — Z79899 Other long term (current) drug therapy: Secondary | ICD-10-CM | POA: Diagnosis not present

## 2020-09-11 DIAGNOSIS — J449 Chronic obstructive pulmonary disease, unspecified: Secondary | ICD-10-CM | POA: Insufficient documentation

## 2020-09-11 DIAGNOSIS — I2510 Atherosclerotic heart disease of native coronary artery without angina pectoris: Secondary | ICD-10-CM | POA: Insufficient documentation

## 2020-09-11 DIAGNOSIS — R079 Chest pain, unspecified: Secondary | ICD-10-CM | POA: Diagnosis not present

## 2020-09-11 DIAGNOSIS — R030 Elevated blood-pressure reading, without diagnosis of hypertension: Secondary | ICD-10-CM | POA: Diagnosis present

## 2020-09-11 LAB — CBC
HCT: 32.4 % — ABNORMAL LOW (ref 36.0–46.0)
Hemoglobin: 11.3 g/dL — ABNORMAL LOW (ref 12.0–15.0)
MCH: 31.9 pg (ref 26.0–34.0)
MCHC: 34.9 g/dL (ref 30.0–36.0)
MCV: 91.5 fL (ref 80.0–100.0)
Platelets: 198 10*3/uL (ref 150–400)
RBC: 3.54 MIL/uL — ABNORMAL LOW (ref 3.87–5.11)
RDW: 12 % (ref 11.5–15.5)
WBC: 8.5 10*3/uL (ref 4.0–10.5)
nRBC: 0 % (ref 0.0–0.2)

## 2020-09-11 LAB — RESP PANEL BY RT-PCR (FLU A&B, COVID) ARPGX2
Influenza A by PCR: NEGATIVE
Influenza B by PCR: NEGATIVE
SARS Coronavirus 2 by RT PCR: NEGATIVE

## 2020-09-11 LAB — BASIC METABOLIC PANEL
Anion gap: 12 (ref 5–15)
BUN: 11 mg/dL (ref 8–23)
CO2: 22 mmol/L (ref 22–32)
Calcium: 8.6 mg/dL — ABNORMAL LOW (ref 8.9–10.3)
Chloride: 95 mmol/L — ABNORMAL LOW (ref 98–111)
Creatinine, Ser: 0.61 mg/dL (ref 0.44–1.00)
GFR, Estimated: >60 mL/min (ref >60)
Glucose, Bld: 164 mg/dL — ABNORMAL HIGH (ref 70–99)
Potassium: 3.3 mmol/L — ABNORMAL LOW (ref 3.5–5.1)
Sodium: 129 mmol/L — ABNORMAL LOW (ref 135–145)

## 2020-09-11 LAB — URINALYSIS, COMPLETE (UACMP) WITH MICROSCOPIC
Bilirubin Urine: NEGATIVE
Glucose, UA: NEGATIVE mg/dL
Ketones, ur: NEGATIVE mg/dL
Nitrite: NEGATIVE
Protein, ur: NEGATIVE mg/dL
Specific Gravity, Urine: 1.003 — ABNORMAL LOW (ref 1.005–1.030)
Squamous Epithelial / HPF: NONE SEEN (ref 0–5)
pH: 6 (ref 5.0–8.0)

## 2020-09-11 LAB — TROPONIN I (HIGH SENSITIVITY)
Troponin I (High Sensitivity): 23 ng/L — ABNORMAL HIGH (ref <18)
Troponin I (High Sensitivity): 32 ng/L — ABNORMAL HIGH (ref <18)

## 2020-09-11 MED ORDER — SODIUM CHLORIDE 0.9 % IV SOLN
1.0000 g | Freq: Once | INTRAVENOUS | Status: AC
  Administered 2020-09-11: 1 g via INTRAVENOUS
  Filled 2020-09-11: qty 10

## 2020-09-11 MED ORDER — POTASSIUM CHLORIDE CRYS ER 20 MEQ PO TBCR
40.0000 meq | EXTENDED_RELEASE_TABLET | Freq: Once | ORAL | Status: AC
  Administered 2020-09-11: 40 meq via ORAL
  Filled 2020-09-11: qty 2

## 2020-09-11 MED ORDER — SODIUM CHLORIDE 0.9 % IV BOLUS
1000.0000 mL | Freq: Once | INTRAVENOUS | Status: AC
  Administered 2020-09-11: 1000 mL via INTRAVENOUS

## 2020-09-11 MED ORDER — CEPHALEXIN 250 MG PO CAPS: 250.0000 mg | ORAL_CAPSULE | Freq: Three times a day (TID) | ORAL | 0 refills | Status: AC

## 2020-09-11 NOTE — Discharge Instructions (Signed)
Please take your antibiotics as prescribed for their entire course.  Please follow-up with your doctor in 2 to 3 days for recheck/reevaluation.  Return to the emergency department for any symptom personally concerning to yourself.

## 2020-09-11 NOTE — ED Notes (Signed)
This RN spoke with [name], pt's son, on pt request to ask for a ride home. Tim advising he will pick patient up around midnight.

## 2020-09-11 NOTE — ED Provider Notes (Signed)
[facility] Emergency Department Provider Note  Time seen: 9:41 PM  I have reviewed the triage vital signs and the nursing notes.   HISTORY  Chief Complaint Hypertension   HPI Amy Morton is a 86 y.o. female with a past medical history of CAD, anxiety, arthritis, depression, gastric reflux, hypertension, hyperlipidemia, presents to the emergency department for evaluation.  According to the patient she was "just not feeling right" at home earlier today.  States she checked her blood pressure and it was noted to be elevated.  Patient states she took nitroglycerin at home but denies ever having chest pain.  Patient's triage note describes chest pain prior to arrival however I discussed this at length with the patient and she states she did not have any chest pain tonight.  Denies any shortness of breath nausea vomiting or diarrhea.  Denies any chest pain or abdominal pain.  No fever cough or congestion.  Overall patient appears well, denies any symptoms at this time.   Past Medical History:  Diagnosis Date  . Abnormal mammogram 2007   Recommend close follow up  . Anemia    Iron, b12, SPEP, UPEP normal, 12/2012  . Anxiety   . Aortic stenosis    Severe by [facility] 05/2020  . aortic stenosis   . Arthritis   . Asthma   . CAD (coronary artery disease)    s/p angioplasty 1996  . Chronic cough   . Depression   . Diverticulosis   . Dyspnea   . Elevated LFTs 2010   s/p ultrasound w/ possible fatty liver; GI consult. Resolution 2011  . Gastric ulcer   . GERD (gastroesophageal reflux disease)   . History of colonic polyps    Dr. Vira Agar  . HOH (hard of hearing)   . Hyperlipidemia   . Hypertension   . Peritoneal carcinomatosis [facility]) 2001   Dr. Oliva Bustard & Dr. Claiborne Rigg s/p chemo  . Shingles 2008   T-9 distribution  . Valvular heart disease    Mild to Moderate MR, AS  . Vitamin D deficiency   . Wheezing     Patient Active Problem List   Diagnosis Date  Noted  . Heart valve disease 08/23/2020  . Head injury 08/20/2020  . History of CVA (cerebrovascular accident)   . Numbness 06/05/2020  . Hot flashes 02/10/2020  . Asthma without status asthmaticus 12/15/2019  . Essential hypertension 12/15/2019  . Prediabetes 02/11/2019  . Memory difficulty 02/08/2018  . Angina pectoris ([place]) 01/22/2018  . Non-rheumatic mitral regurgitation 02/12/2017  . Orthostasis 10/20/2016  . Arthritis 05/22/2016  . COPD (chronic obstructive pulmonary disease) ([place]) 11/21/2015  . Hyponatremia 09/30/2015  . CAD (coronary artery disease) 04/27/2015  . Complex tear of medial meniscus of right knee as current injury 07/26/2014  . Primary osteoarthritis of right knee 07/26/2014  . Anemia 05/12/2014  . Aortic valve stenosis 04/18/2014  . History of peritoneal carcinoma 04/17/2014  . Chronic diarrhea 04/17/2014  . Hyperlipidemia 04/17/2014  . Anxiety and depression 11/13/2013  . Vitamin D deficiency 11/13/2013    Past Surgical History:  Procedure Laterality Date  . ABDOMINAL HYSTERECTOMY  2001  . APPENDECTOMY    . CARDIAC CATHETERIZATION  2007   50% mid LAD stenosis, 70% proximal RCA with 100% distal RCA stenosis with collaterals, EF 65%  . CARDIAC CATHETERIZATION N/A 04/27/2015   Procedure: Left Heart Cath and Coronary Angiography;  Surgeon: Jettie Booze, MD;  Location: [place] CV LAB;  Service: Cardiovascular;  Laterality: N/A;  . CARDIAC CATHETERIZATION N/A 04/27/2015   Procedure: Coronary Stent Intervention;  Surgeon: Jettie Booze, MD;  Location: [place] CV LAB;  Service: Cardiovascular;  Laterality: N/A;  . CATARACT EXTRACTION W/PHACO Left 09/25/2016   Procedure: CATARACT EXTRACTION PHACO AND INTRAOCULAR LENS PLACEMENT (IOC);  Surgeon: Eulogio Bear, MD;  Location: [facility] ORS;  Service: Ophthalmology;  Laterality: Left;  [place] 46.7 AP% 8.1 CDE 3.80 Fluid pack lot # [id number]  . CATARACT EXTRACTION W/PHACO Right 11/06/2016   Procedure:  CATARACT EXTRACTION PHACO AND INTRAOCULAR LENS PLACEMENT ([place]);  Surgeon: Eulogio Bear, MD;  Location: [facility] ORS;  Service: Ophthalmology;  Laterality: Right;  [place] 49.5 AP8.7 CDE4.28 LOT # [id number]  . CHOLECYSTECTOMY  1987  . COLECTOMY  2001  . CORONARY ANGIOPLASTY  1996  . CORONARY ANGIOPLASTY    . LAPAROSCOPIC BILATERAL SALPINGO OOPHERECTOMY  1988   Dr. Laverta Baltimore  . LEFT HEART CATH AND CORONARY ANGIOGRAPHY N/A 01/25/2018   Procedure: LEFT HEART CATH AND CORONARY ANGIOGRAPHY;  Surgeon: Wellington Hampshire, MD;  Location: [place] CV LAB;  Service: Cardiovascular;  Laterality: N/A;  . RIGHT/LEFT HEART CATH AND CORONARY ANGIOGRAPHY N/A 05/16/2020   Procedure: RIGHT/LEFT HEART CATH AND CORONARY ANGIOGRAPHY;  Surgeon: Wellington Hampshire, MD;  Location: [place] CV LAB;  Service: Cardiovascular;  Laterality: N/A;  . TONSILLECTOMY  1950    Prior to Admission medications   Medication Sig Start Date End Date Taking? Authorizing Provider  acetaminophen (TYLENOL) 500 MG tablet Take 1,000 mg by mouth in the morning and at bedtime.    [provider]  Ascorbic Acid (VITAMIN C PO) Take by mouth.    [provider]  aspirin 81 MG tablet Take 81 mg by mouth daily.    [provider]  atorvastatin (LIPITOR) 40 MG tablet Take 1 tablet (40 mg total) by mouth daily. 07/18/20 07/13/21  Loel Dubonnet, NP  Cholecalciferol 5000 units TABS Take 5,000 Units by mouth daily.    [provider]  clonazePAM (KLONOPIN) 0.5 MG tablet Take 0.5 tablets (0.25 mg total) by mouth 2 (two) times daily as needed for anxiety. Patient not taking: Reported on 08/23/2020 07/09/20   [name], MD  clopidogrel (PLAVIX) 75 MG tablet TAKE 1 TABLET BY MOUTH DAILY WITH BREAKFAST 09/07/20   Wellington Hampshire, MD  isosorbide mononitrate (IMDUR) 30 MG 24 hr tablet Take 1.5 tablets (45 mg total) by mouth daily. 09/07/20   Wellington Hampshire, MD  loperamide (IMODIUM A-D) 2 MG tablet Take 2 mg by  mouth 4 (four) times daily as needed for diarrhea or loose stools.    [provider]  metoprolol tartrate (LOPRESSOR) 50 MG tablet TAKE 1 TABLET(50 MG) BY MOUTH TWICE DAILY 09/07/20   Wellington Hampshire, MD  mirtazapine (REMERON) 45 MG tablet TAKE 1 TABLET BY MOUTH EVERY NIGHT AT BEDTIME 06/07/20   [name], MD  Multiple Vitamin (MULTIVITAMIN) capsule Take 1 capsule by mouth daily.    [provider]  nitroGLYCERIN (NITROSTAT) 0.4 MG SL tablet PLACE 1 TABLET UNDER THE TONGUE EVERY 5 MINUTES AS NEEDED FOR CHEST PAIN, 3 DOSES MAX Patient not taking: Reported on 08/23/2020 11/24/19   Wellington Hampshire, MD  Nutritional Supplements (OSTEO ADVANCE) TABS Take 1 tablet by mouth daily.     [provider]  omega-3 acid ethyl esters (LOVAZA) 1 g capsule TAKE 2 CAPSULES BY MOUTH TWICE DAILY 06/11/20   Einar Pheasant, MD  pantoprazole (PROTONIX) 40  MG tablet TAKE 1 TABLET(40 MG) BY MOUTH DAILY 09/07/20   Wellington Hampshire, MD  Ubiquinol 100 MG CAPS Take 100 mg by mouth every morning.    [provider]  vitamin B-12 (CYANOCOBALAMIN) 250 MCG tablet Take 250 mcg by mouth daily.    [provider]    Allergies  Allergen Reactions  . Abilify [Aripiprazole] Other (See Comments)    Burning in Chest Elevated b/p Insomnia  . Celexa [Citalopram]     Caused low sodium level.   . Ibuprofen Other (See Comments)    GIB    Family History  Problem Relation Age of Onset  . Hypertension Mother   . Stroke Mother 36       Cerebral Hemorrhage  . Cancer Father        bone cancer  . Diabetes Sister   . Diabetes Brother   . Heart disease Brother   . Cancer Daughter 5       Ovarian cancer  . Colon cancer Neg Hx   . Colon polyps Neg Hx     Social History Social History   Tobacco Use  . Smoking status: Never Smoker  . Smokeless tobacco: Never Used  Vaping Use  . Vaping Use: Never used  Substance Use Topics  . Alcohol use: No    Alcohol/week: 0.0  standard drinks  . Drug use: No    Review of Systems Constitutional: Negative for fever. Cardiovascular: Negative for chest pain. Respiratory: Negative for shortness of breath. Gastrointestinal: Negative for abdominal pain, vomiting  Musculoskeletal: Negative for musculoskeletal complaints Neurological: Negative for headache All other ROS negative  ____________________________________________   PHYSICAL EXAM:  VITAL SIGNS: ED Triage Vitals  Enc Vitals Group     BP 09/11/20 2006 (!) 186/95     Pulse Rate 09/11/20 2006 (!) 110     Resp 09/11/20 2006 20     Temp 09/11/20 2006 98.5 F (36.9 C)     Temp Source 09/11/20 2006 Oral     SpO2 09/11/20 2006 96 %     Weight 09/11/20 2008 127 lb (57.6 kg)     Height 09/11/20 2008 4\' 11"  (1.499 m)     Head Circumference --      Peak Flow --      Pain Score 09/11/20 2008 0     Pain Loc --      Pain Edu? --      Excl. in [place]? --     Constitutional: Alert and oriented. Well appearing and in no distress. Eyes: Normal exam ENT      Head: Normocephalic and atraumatic.      Mouth/Throat: Mucous membranes are moist. Cardiovascular: Normal rate, regular rhythm. Respiratory: Normal respiratory effort without tachypnea nor retractions. Breath sounds are clear Gastrointestinal: Soft and nontender. No distention.   Musculoskeletal: Nontender with normal range of motion in all extremities.  Neurologic:  Normal speech and language. No gross focal neurologic deficits  Skin:  Skin is warm, dry and intact.  Psychiatric: Mood and affect are normal.   ____________________________________________    EKG  EKG viewed and interpreted by myself shows a sinus tachycardia at 110 bpm with a narrow QRS, left axis deviation, largely normal intervals with no concerning ST changes.  ____________________________________________    RADIOLOGY  Chest x-ray shows atelectasis versus infiltrate.  ____________________________________________   INITIAL  IMPRESSION / ASSESSMENT AND PLAN / ED COURSE  Pertinent labs & imaging results that were available during my care of the patient  were reviewed by me and considered in my medical decision making (see chart for details).   Patient presents emergency department for "just not feeling right" earlier today.  Cannot really describe the sensation any further.  Denies any syncope or near syncope denies any chest pain or shortness of breath.  Nearly completely negative review of systems.  Currently the patient appears well she is mildly tachycardic at 110 bpm.  Hypertensive 171/90.  Patient's labs do show mild hyponatremia 129 although the patient historical labs show low 130s as baseline.  Troponin is 23 however this is similar to historical values, we will recheck a troponin at the 2-hour mark as a precaution.  CBC is at baseline.  Reassuring EKG and chest x-ray with no cough fever or concerning symptoms.  We will check a Covid swab as a precaution.  IV hydrating well awaiting for the results.  Overall patient appears well if repeat troponin and Covid swab is negative I anticipate likely discharge home after fluids.  Covid is negative.  Urinalysis and second troponin pending.  Urinalysis consistent with urinary tract infection.  Repeat troponin unchanged.  We will dose Rocephin and discharged with a short course of antibiotics.  Patient agreeable plan of care.  Patient will follow up with her doctor this week.  Amy Morton was evaluated in Emergency Department on 09/11/2020 for the symptoms described in the history of present illness. She was evaluated in the context of the global COVID-19 pandemic, which necessitated consideration that the patient might be at risk for infection with the SARS-CoV-2 virus that causes COVID-19. Institutional protocols and algorithms that pertain to the evaluation of patients at risk for COVID-19 are in a state of rapid change based on information released by regulatory  bodies including the CDC and federal and state organizations. These policies and algorithms were followed during the patient's care in the ED.  ____________________________________________   FINAL CLINICAL IMPRESSION(S) / ED DIAGNOSES  Hypertension UTI   Harvest Dark, MD 09/11/20 2252

## 2020-09-11 NOTE — ED Triage Notes (Signed)
Pt to ED POV for HTN, states she took nitro x2 PTA. States CP PTA, denies CP at this time

## 2020-09-12 ENCOUNTER — Telehealth: Payer: Self-pay | Admitting: Family Medicine

## 2020-09-12 NOTE — Telephone Encounter (Signed)
Pt sister called to schedule a ED follow up for Hypertension,and a UTI  ED told her that she needed to be seen in the next 3 days  No appts available

## 2020-09-12 NOTE — Telephone Encounter (Signed)
I called and spoke with the patients daughter and scheduled her to be seen Friday.  Demetric Dunnaway,cma

## 2020-09-14 ENCOUNTER — Ambulatory Visit (INDEPENDENT_AMBULATORY_CARE_PROVIDER_SITE_OTHER): Payer: Medicare Other | Admitting: Family Medicine

## 2020-09-14 ENCOUNTER — Encounter: Payer: Self-pay | Admitting: Family Medicine

## 2020-09-14 ENCOUNTER — Other Ambulatory Visit: Payer: Self-pay

## 2020-09-14 VITALS — Ht 59.0 in | Wt 125.0 lb

## 2020-09-14 DIAGNOSIS — E871 Hypo-osmolality and hyponatremia: Secondary | ICD-10-CM

## 2020-09-14 DIAGNOSIS — I10 Essential (primary) hypertension: Secondary | ICD-10-CM | POA: Diagnosis not present

## 2020-09-14 DIAGNOSIS — N3000 Acute cystitis without hematuria: Secondary | ICD-10-CM | POA: Diagnosis not present

## 2020-09-14 DIAGNOSIS — N390 Urinary tract infection, site not specified: Secondary | ICD-10-CM | POA: Insufficient documentation

## 2020-09-14 LAB — URINE CULTURE: Culture: >=100000 — AB

## 2020-09-14 NOTE — Assessment & Plan Note (Signed)
Symptoms she describes could certainly be related to having a UTI.  She feels better overall since being on the antibiotics.  She will complete her course of antibiotics.

## 2020-09-14 NOTE — Progress Notes (Signed)
Virtual Visit via telephone Note  This visit type was conducted due to national recommendations for restrictions regarding the COVID-19 pandemic (e.g. social distancing).  This format is felt to be most appropriate for this patient at this time.  All issues noted in this document were discussed and addressed.  No physical exam was performed (except for noted visual exam findings with Video Visits).   I connected with Amy Morton today at 10:30 AM EST by telephone and verified that I am speaking with the correct person using two identifiers. Location patient: home Location provider: work Persons participating in the virtual visit: patient, provider  I discussed the limitations, risks, security and privacy concerns of performing an evaluation and management service by telephone and the availability of in person appointments. I also discussed with the patient that there may be a patient responsible charge related to this service. The patient expressed understanding and agreed to proceed.  Interactive audio and video telecommunications were attempted between this provider and patient, however failed, due to patient having technical difficulties OR patient did not have access to video capability.  We continued and completed visit with audio only.   Reason for visit: f/u  HPI: Hypertension: Patient notes she had an episode where she just did not feel right.  She notes her mind was not working correctly.  She checked her blood pressure and her systolic BP was over 062.  She was evaluated in the emergency department and her blood pressure did come down slightly.  She had no issues with chest pain or shortness of breath.  She had taken 2 nitroglycerin that did help bring her blood pressure down slightly.  She had a reassuring EKG.  Lab work overall was reassuring with slight hyponatremia and slight hypokalemia which are near her baseline.  She has been on Imdur 45 mg once daily and metoprolol.  Most  recent blood pressure is 150/77.  She has been checking her blood pressure 4-5 times a day.  UTI: Found on work-up in the ED.  She noted urinary frequency and urgency.  No dysuria.  No hematuria.  She has been on Keflex since discharge from the ED.  She feels as though her symptoms have improved with regards to her urinary symptoms as well as her not feeling right.   ROS: See pertinent positives and negatives per HPI.  Past Medical History:  Diagnosis Date  . Abnormal mammogram 2007   Recommend close follow up  . Anemia    Iron, b12, SPEP, UPEP normal, 12/2012  . Anxiety   . Aortic stenosis    Severe by [facility] 05/2020  . aortic stenosis   . Arthritis   . Asthma   . CAD (coronary artery disease)    s/p angioplasty 1996  . Chronic cough   . Depression   . Diverticulosis   . Dyspnea   . Elevated LFTs 2010   s/p ultrasound w/ possible fatty liver; GI consult. Resolution 2011  . Gastric ulcer   . GERD (gastroesophageal reflux disease)   . History of colonic polyps    Dr. Vira Agar  . HOH (hard of hearing)   . Hyperlipidemia   . Hypertension   . Peritoneal carcinomatosis [facility]) 2001   Dr. Oliva Bustard & Dr. Claiborne Rigg s/p chemo  . Shingles 2008   T-9 distribution  . Valvular heart disease    Mild to Moderate MR, AS  . Vitamin D deficiency   . Wheezing     Past Surgical History:  Procedure Laterality Date  . ABDOMINAL HYSTERECTOMY  2001  . APPENDECTOMY    . CARDIAC CATHETERIZATION  2007   50% mid LAD stenosis, 70% proximal RCA with 100% distal RCA stenosis with collaterals, EF 65%  . CARDIAC CATHETERIZATION N/A 04/27/2015   Procedure: Left Heart Cath and Coronary Angiography;  Surgeon: Jettie Booze, MD;  Location: [place] CV LAB;  Service: Cardiovascular;  Laterality: N/A;  . CARDIAC CATHETERIZATION N/A 04/27/2015   Procedure: Coronary Stent Intervention;  Surgeon: Jettie Booze, MD;  Location: [place] CV LAB;  Service: Cardiovascular;  Laterality: N/A;  .  CATARACT EXTRACTION W/PHACO Left 09/25/2016   Procedure: CATARACT EXTRACTION PHACO AND INTRAOCULAR LENS PLACEMENT (IOC);  Surgeon: Eulogio Bear, MD;  Location: [facility] ORS;  Service: Ophthalmology;  Laterality: Left;  [place] 46.7 AP% 8.1 CDE 3.80 Fluid pack lot # [id number]  . CATARACT EXTRACTION W/PHACO Right 11/06/2016   Procedure: CATARACT EXTRACTION PHACO AND INTRAOCULAR LENS PLACEMENT ([place]);  Surgeon: Eulogio Bear, MD;  Location: [facility] ORS;  Service: Ophthalmology;  Laterality: Right;  [place] 49.5 AP8.7 CDE4.28 LOT # [id number]  . CHOLECYSTECTOMY  1987  . COLECTOMY  2001  . CORONARY ANGIOPLASTY  1996  . CORONARY ANGIOPLASTY    . LAPAROSCOPIC BILATERAL SALPINGO OOPHERECTOMY  1988   Dr. Laverta Baltimore  . LEFT HEART CATH AND CORONARY ANGIOGRAPHY N/A 01/25/2018   Procedure: LEFT HEART CATH AND CORONARY ANGIOGRAPHY;  Surgeon: Wellington Hampshire, MD;  Location: [place] CV LAB;  Service: Cardiovascular;  Laterality: N/A;  . RIGHT/LEFT HEART CATH AND CORONARY ANGIOGRAPHY N/A 05/16/2020   Procedure: RIGHT/LEFT HEART CATH AND CORONARY ANGIOGRAPHY;  Surgeon: Wellington Hampshire, MD;  Location: [place] CV LAB;  Service: Cardiovascular;  Laterality: N/A;  . TONSILLECTOMY  1950    Family History  Problem Relation Age of Onset  . Hypertension Mother   . Stroke Mother 74       Cerebral Hemorrhage  . Cancer Father        bone cancer  . Diabetes Sister   . Diabetes Brother   . Heart disease Brother   . Cancer Daughter 30       Ovarian cancer  . Colon cancer Neg Hx   . Colon polyps Neg Hx     SOCIAL HX: Non-smoker   Current Outpatient Medications:  .  acetaminophen (TYLENOL) 500 MG tablet, Take 1,000 mg by mouth in the morning and at bedtime., Disp: , Rfl:  .  Ascorbic Acid (VITAMIN C PO), Take by mouth., Disp: , Rfl:  .  aspirin 81 MG tablet, Take 81 mg by mouth daily., Disp: , Rfl:  .  atorvastatin (LIPITOR) 40 MG tablet, Take 1 tablet (40 mg total) by mouth daily., Disp: 90 tablet, Rfl: 3 .   cephALEXin (KEFLEX) 250 MG capsule, Take 1 capsule (250 mg total) by mouth 3 (three) times daily for 7 days., Disp: 21 capsule, Rfl: 0 .  Cholecalciferol 5000 units TABS, Take 5,000 Units by mouth daily., Disp: , Rfl:  .  clonazePAM (KLONOPIN) 0.5 MG tablet, Take 0.5 tablets (0.25 mg total) by mouth 2 (two) times daily as needed for anxiety., Disp: 10 tablet, Rfl: 0 .  clopidogrel (PLAVIX) 75 MG tablet, TAKE 1 TABLET BY MOUTH DAILY WITH BREAKFAST, Disp: 90 tablet, Rfl: 0 .  isosorbide mononitrate (IMDUR) 30 MG 24 hr tablet, Take 1.5 tablets (45 mg total) by mouth daily., Disp: 135 tablet, Rfl: 0 .  loperamide (IMODIUM A-D) 2 MG tablet, Take 2 mg  by mouth 4 (four) times daily as needed for diarrhea or loose stools., Disp: , Rfl:  .  metoprolol tartrate (LOPRESSOR) 50 MG tablet, TAKE 1 TABLET(50 MG) BY MOUTH TWICE DAILY, Disp: 180 tablet, Rfl: 0 .  mirtazapine (REMERON) 45 MG tablet, TAKE 1 TABLET BY MOUTH EVERY NIGHT AT BEDTIME, Disp: 30 tablet, Rfl: 3 .  Multiple Vitamin (MULTIVITAMIN) capsule, Take 1 capsule by mouth daily., Disp: , Rfl:  .  nitroGLYCERIN (NITROSTAT) 0.4 MG SL tablet, PLACE 1 TABLET UNDER THE TONGUE EVERY 5 MINUTES AS NEEDED FOR CHEST PAIN, 3 DOSES MAX, Disp: 25 tablet, Rfl: 1 .  Nutritional Supplements (OSTEO ADVANCE) TABS, Take 1 tablet by mouth daily. , Disp: , Rfl:  .  omega-3 acid ethyl esters (LOVAZA) 1 g capsule, TAKE 2 CAPSULES BY MOUTH TWICE DAILY, Disp: 360 capsule, Rfl: 1 .  pantoprazole (PROTONIX) 40 MG tablet, TAKE 1 TABLET(40 MG) BY MOUTH DAILY, Disp: 90 tablet, Rfl: 0 .  Ubiquinol 100 MG CAPS, Take 100 mg by mouth every morning., Disp: , Rfl:  .  vitamin B-12 (CYANOCOBALAMIN) 250 MCG tablet, Take 250 mcg by mouth daily., Disp: , Rfl:  No current facility-administered medications for this visit.  Facility-Administered Medications Ordered in Other Visits:  .  sodium chloride 0.9 % nebulizer solution 3 mL, 3 mL, Nebulization, Once **FOLLOWED BY** methacholine  (PROVOCHOLINE) inhaler solution 0.125 mg, 2 mL, Inhalation, Once **FOLLOWED BY** methacholine (PROVOCHOLINE) inhaler solution 0.5 mg, 2 mL, Inhalation, Once **FOLLOWED BY** methacholine (PROVOCHOLINE) inhaler solution 2 mg, 2 mL, Inhalation, Once **FOLLOWED BY** methacholine (PROVOCHOLINE) inhaler solution 8 mg, 2 mL, Inhalation, Once **FOLLOWED BY** methacholine (PROVOCHOLINE) inhaler solution 32 mg, 2 mL, Inhalation, Once **FOLLOWED BY** [COMPLETED] albuterol (PROVENTIL) (2.5 MG/3ML) 0.083% nebulizer solution 2.5 mg, 2.5 mg, Nebulization, Once, [name], MD, 2.5 mg at 08/27/17 0908  EXAM:  VITALS per patient if applicable:  GENERAL: alert, oriented, appears well and in no acute distress  HEENT: atraumatic, conjunttiva clear, no obvious abnormalities on inspection of external nose and ears  NECK: normal movements of the head and neck  LUNGS: on inspection no signs of respiratory distress, breathing rate appears normal, no obvious gross SOB, gasping or wheezing  CV: no obvious cyanosis  MS: moves all visible extremities without noticeable abnormality  PSYCH/NEURO: pleasant and cooperative, no obvious depression or anxiety, speech and thought processing grossly intact  ASSESSMENT AND PLAN:  Discussed the following assessment and plan:  Problem List Items Addressed This Visit    Essential hypertension    Blood pressure was elevated though has returned to more reasonable level allowing some permissive hypertension given her aortic valve stenosis.  She will continue Imdur 45 mg once daily and metoprolol 50 mg twice daily.  I advised that she only check her blood pressure once daily about 2 hours after taking her medications.  I do believe anxiety and worry about her blood pressure does drive her BP up.      Hyponatremia - Primary    Chronic intermittent issues with this.  We will plan on rechecking.      Relevant Orders   Basic Metabolic Panel (BMET)   UTI (urinary tract  infection)    Symptoms she describes could certainly be related to having a UTI.  She feels better overall since being on the antibiotics.  She will complete her course of antibiotics.          I discussed the assessment and treatment plan with the patient. The patient was provided  an opportunity to ask questions and all were answered. The patient agreed with the plan and demonstrated an understanding of the instructions.   The patient was advised to call back or seek an in-person evaluation if the symptoms worsen or if the condition fails to improve as anticipated.  I provided 10 minutes of non-face-to-face time during this encounter.   [name], MD

## 2020-09-14 NOTE — Assessment & Plan Note (Signed)
Blood pressure was elevated though has returned to more reasonable level allowing some permissive hypertension given her aortic valve stenosis.  She will continue Imdur 45 mg once daily and metoprolol 50 mg twice daily.  I advised that she only check her blood pressure once daily about 2 hours after taking her medications.  I do believe anxiety and worry about her blood pressure does drive her BP up.

## 2020-09-14 NOTE — Assessment & Plan Note (Signed)
Chronic intermittent issues with this.  We will plan on rechecking.

## 2020-09-23 ENCOUNTER — Other Ambulatory Visit: Payer: Self-pay | Admitting: Family Medicine

## 2020-09-23 DIAGNOSIS — G4700 Insomnia, unspecified: Secondary | ICD-10-CM

## 2020-11-06 ENCOUNTER — Encounter: Payer: Self-pay | Admitting: Family Medicine

## 2020-11-07 ENCOUNTER — Other Ambulatory Visit: Payer: Self-pay | Admitting: Family Medicine

## 2020-11-07 MED ORDER — COLESTIPOL HCL 1 G PO TABS: 1.0000 g | ORAL_TABLET | Freq: Every day | ORAL | 1 refills | Status: DC

## 2020-11-16 ENCOUNTER — Other Ambulatory Visit: Payer: Self-pay | Admitting: Family Medicine

## 2020-11-16 ENCOUNTER — Other Ambulatory Visit: Payer: Self-pay

## 2020-11-16 ENCOUNTER — Ambulatory Visit (INDEPENDENT_AMBULATORY_CARE_PROVIDER_SITE_OTHER): Payer: Medicare Other | Admitting: Cardiovascular Disease

## 2020-11-16 ENCOUNTER — Encounter: Payer: Self-pay | Admitting: Cardiovascular Disease

## 2020-11-16 VITALS — BP 162/54 | HR 92 | Ht 59.0 in | Wt 123.5 lb

## 2020-11-16 DIAGNOSIS — E785 Hyperlipidemia, unspecified: Secondary | ICD-10-CM | POA: Diagnosis not present

## 2020-11-16 DIAGNOSIS — I2510 Atherosclerotic heart disease of native coronary artery without angina pectoris: Secondary | ICD-10-CM | POA: Diagnosis not present

## 2020-11-16 DIAGNOSIS — F32A Depression, unspecified: Secondary | ICD-10-CM

## 2020-11-16 DIAGNOSIS — I10 Essential (primary) hypertension: Secondary | ICD-10-CM

## 2020-11-16 DIAGNOSIS — F419 Anxiety disorder, unspecified: Secondary | ICD-10-CM

## 2020-11-16 DIAGNOSIS — I350 Nonrheumatic aortic (valve) stenosis: Secondary | ICD-10-CM

## 2020-11-16 NOTE — Progress Notes (Signed)
Cardiology Office Note   Date:  11/16/2020   ID:  Amy Morton, Amy Morton 1934-10-05, MRN [id number]  PCP:  [name], MD  Cardiologist:   Kathlyn Sacramento, MD   Chief Complaint  Patient presents with  . Other    C/o elevated BP. Meds reviewed verbally with pt.      History of Present Illness: Amy Morton is a 86 y.o. female who presents for for a follow-up visit regarding coronary artery disease and moderate aortic stenosis. Other medical problems include anemia, hypertension, hyperlipidemia, anxiety and depression. She had previous left circumflex PCI in 2016 with known occluded distal right coronary artery. The patient was hospitalized in June 2019 at [facility] with unstable angina.  She underwent cardiac catheterization which showed patent left circumflex stents without significant restenosis, stable discrete 70% stenosis in the mid LAD and chronically occluded distal RCA with collaterals.  Aortic stenosis was moderate with a peak to peak gradient of 20 mmHg.  LVEDP was mildly elevated.  Overall, there was no significant change in coronary anatomy since 2016.  She lives by herself as her husband died more than 10 years ago.  She only has 1 son alive.  She continues to be independent but her memory has declined and also her anxiety does not seem to be controlled since Celexa was switched to BuSpar.  She was most recently seen by me in October for unstable angina.  She had some dizziness, active chest pain and mild hypertension and thus she was admitted to the hospital from our clinic.  I proceeded with right and left cardiac catheterization which showed no significant change in coronary anatomy since 2019.  Right heart catheterization showed low filling pressures, normal pulmonary pressure, normal cardiac output and severe aortic stenosis with mean gradient of 30 mmHg and valve area of 0.82 square. She was hospitalized in November with TIA.  The patient was seen by our TAVR  team.  She underwent PYP scan which was not consistent with T TR amyloid.  It was felt that her aortic stenosis was moderate and there was concern about the patient's memory and continued functional decline.  She continues to struggle with anxiety, memory decline and weight loss.  She denies chest pain or shortness of breath.  She lost 15 pounds over the last 6 months.   Past Medical History:  Diagnosis Date  . Abnormal mammogram 2007   Recommend close follow up  . Anemia    Iron, b12, SPEP, UPEP normal, 12/2012  . Anxiety   . Aortic stenosis    Severe by [facility] 05/2020  . aortic stenosis   . Arthritis   . Asthma   . CAD (coronary artery disease)    s/p angioplasty 1996  . Chronic cough   . Depression   . Diverticulosis   . Dyspnea   . Elevated LFTs 2010   s/p ultrasound w/ possible fatty liver; GI consult. Resolution 2011  . Gastric ulcer   . GERD (gastroesophageal reflux disease)   . History of colonic polyps    Dr. Vira Agar  . HOH (hard of hearing)   . Hyperlipidemia   . Hypertension   . Peritoneal carcinomatosis [facility]) 2001   Dr. Oliva Bustard & Dr. Claiborne Rigg s/p chemo  . Shingles 2008   T-9 distribution  . Valvular heart disease    Mild to Moderate MR, AS  . Vitamin D deficiency   . Wheezing     Past Surgical History:  Procedure Laterality  Date  . ABDOMINAL HYSTERECTOMY  2001  . APPENDECTOMY    . CARDIAC CATHETERIZATION  2007   50% mid LAD stenosis, 70% proximal RCA with 100% distal RCA stenosis with collaterals, EF 65%  . CARDIAC CATHETERIZATION N/A 04/27/2015   Procedure: Left Heart Cath and Coronary Angiography;  Surgeon: Jettie Booze, MD;  Location: [place] [facility];  Service: Cardiovascular;  Laterality: N/A;  . CARDIAC CATHETERIZATION N/A 04/27/2015   Procedure: Coronary Stent Intervention;  Surgeon: Jettie Booze, MD;  Location: [place] [facility];  Service: Cardiovascular;  Laterality: N/A;  . CATARACT EXTRACTION W/PHACO Left 09/25/2016    Procedure: CATARACT EXTRACTION PHACO AND INTRAOCULAR LENS PLACEMENT (IOC);  Surgeon: Eulogio Bear, MD;  Location: [facility] ORS;  Service: Ophthalmology;  Laterality: Left;  [place] 46.7 AP% 8.1 CDE 3.80 Fluid pack lot # [id number]  . CATARACT EXTRACTION W/PHACO Right 11/06/2016   Procedure: CATARACT EXTRACTION PHACO AND INTRAOCULAR LENS PLACEMENT ([place]);  Surgeon: Eulogio Bear, MD;  Location: [facility] ORS;  Service: Ophthalmology;  Laterality: Right;  [place] 49.5 AP8.7 CDE4.28 LOT # [id number]  . CHOLECYSTECTOMY  1987  . COLECTOMY  2001  . CORONARY ANGIOPLASTY  1996  . CORONARY ANGIOPLASTY    . LAPAROSCOPIC BILATERAL SALPINGO OOPHERECTOMY  1988   Dr. Laverta Baltimore  . LEFT HEART CATH AND CORONARY ANGIOGRAPHY N/A 01/25/2018   Procedure: LEFT HEART CATH AND CORONARY ANGIOGRAPHY;  Surgeon: Wellington Hampshire, MD;  Location: [place] [facility];  Service: Cardiovascular;  Laterality: N/A;  . RIGHT/LEFT HEART CATH AND CORONARY ANGIOGRAPHY N/A 05/16/2020   Procedure: RIGHT/LEFT HEART CATH AND CORONARY ANGIOGRAPHY;  Surgeon: Wellington Hampshire, MD;  Location: [place] [facility];  Service: Cardiovascular;  Laterality: N/A;  . TONSILLECTOMY  1950     Current Outpatient Medications  Medication Sig Dispense Refill  . acetaminophen (TYLENOL) 500 MG tablet Take 1,000 mg by mouth in the morning and at bedtime.    . Ascorbic Acid (VITAMIN C PO) Take by mouth.    Marland Kitchen aspirin 81 MG tablet Take 81 mg by mouth daily.    Marland Kitchen atorvastatin (LIPITOR) 40 MG tablet Take 1 tablet (40 mg total) by mouth daily. 90 tablet 3  . Cholecalciferol 5000 units TABS Take 5,000 Units by mouth daily.    . clonazePAM (KLONOPIN) 0.5 MG tablet Take 0.5 tablets (0.25 mg total) by mouth 2 (two) times daily as needed for anxiety. 10 tablet 0  . clopidogrel (PLAVIX) 75 MG tablet TAKE 1 TABLET BY MOUTH DAILY WITH BREAKFAST 90 tablet 0  . colestipol (COLESTID) 1 g tablet TAKE 1 TABLET(1 GRAM) BY MOUTH DAILY 90 tablet 1  . isosorbide mononitrate (IMDUR) 30 MG  24 hr tablet Take 1.5 tablets (45 mg total) by mouth daily. 135 tablet 0  . metoprolol tartrate (LOPRESSOR) 50 MG tablet TAKE 1 TABLET(50 MG) BY MOUTH TWICE DAILY 180 tablet 0  . mirtazapine (REMERON) 45 MG tablet TAKE 1 TABLET BY MOUTH EVERY NIGHT AT BEDTIME 30 tablet 3  . Multiple Vitamin (MULTIVITAMIN) capsule Take 1 capsule by mouth daily.    . nitroGLYCERIN (NITROSTAT) 0.4 MG SL tablet PLACE 1 TABLET UNDER THE TONGUE EVERY 5 MINUTES AS NEEDED FOR CHEST PAIN, 3 DOSES MAX 25 tablet 1  . Nutritional Supplements (OSTEO ADVANCE) TABS Take 1 tablet by mouth daily.     Marland Kitchen omega-3 acid ethyl esters (LOVAZA) 1 g capsule TAKE 2 CAPSULES BY MOUTH TWICE DAILY 360 capsule 1  . pantoprazole (PROTONIX) 40 MG tablet TAKE  1 TABLET(40 MG) BY MOUTH DAILY 90 tablet 0  . Ubiquinol 100 MG CAPS Take 100 mg by mouth every morning.    . vitamin B-12 (CYANOCOBALAMIN) 250 MCG tablet Take 250 mcg by mouth daily.     No current facility-administered medications for this visit.   Facility-Administered Medications Ordered in Other Visits  Medication Dose Route Frequency Provider Last Rate Last Admin  . sodium chloride 0.9 % nebulizer solution 3 mL  3 mL Nebulization Once [name], MD       Followed by  . methacholine (PROVOCHOLINE) inhaler solution 0.125 mg  2 mL Inhalation Once [name], MD       Followed by  . methacholine (PROVOCHOLINE) inhaler solution 0.5 mg  2 mL Inhalation Once [name], MD       Followed by  . methacholine (PROVOCHOLINE) inhaler solution 2 mg  2 mL Inhalation Once [name], MD       Followed by  . methacholine (PROVOCHOLINE) inhaler solution 8 mg  2 mL Inhalation Once [name], MD       Followed by  . methacholine (PROVOCHOLINE) inhaler solution 32 mg  2 mL Inhalation Once [name], MD        Allergies:   Abilify [aripiprazole], Celexa [citalopram], and Ibuprofen    Social History:  The patient  reports that she has never  smoked. She has never used smokeless tobacco. She reports that she does not drink alcohol and does not use drugs.   Family History:  The patient's family history includes Cancer in her father; Cancer (age of onset: 66) in her daughter; Diabetes in her brother and sister; Heart disease in her brother; Hypertension in her mother; Stroke (age of onset: 50) in her mother.    ROS:  Please see the history of present illness.   Otherwise, review of systems are positive for none.   All other systems are reviewed and negative.    PHYSICAL EXAM: VS:  BP (!) 162/54 (BP Location: Left Arm, Patient Position: Sitting, Cuff Size: Normal)   Pulse 92   Ht 4\' 11"  (1.499 m)   Wt 123 lb 8 oz (56 kg)   SpO2 98%   BMI 24.94 kg/m  , BMI Body mass index is 24.94 kg/m. GEN: Well nourished, well developed, in no acute distress  HEENT: normal  Neck: no JVD, carotid bruits, or masses Cardiac: RRR; no rubs, or gallops,no edema .  3/6 crescendo decrescendo systolic murmur in the aortic area which is mid peaking. Respiratory:  clear to auscultation bilaterally, normal work of breathing GI: soft, nontender, nondistended, + BS MS: no deformity or atrophy  Skin: warm and dry, no rash Neuro:  Strength and sensation are intact Psych: euthymic mood, full affect   EKG:  EKG is ordered today. The ekg ordered today demonstrates sinus rhythm, LVH with repolarization abnormalities.  Left axis deviation   Recent Labs: 06/06/2020: TSH 2.509 07/01/2020: ALT 23 09/11/2020: BUN 11; Creatinine, Ser 0.61; Hemoglobin 11.3; Platelets 198; Potassium 3.3; Sodium 129    Lipid Panel    Component Value Date/Time   CHOL 95 06/06/2020 0552   CHOL 115 09/03/2017 1049   TRIG 68 06/06/2020 0552   HDL 41 06/06/2020 0552   HDL 50 09/03/2017 1049   CHOLHDL 2.3 06/06/2020 0552   VLDL 14 06/06/2020 0552   LDLCALC 40 06/06/2020 0552   LDLCALC 29 09/03/2017 1049   LDLDIRECT 35.0 02/21/2019 1012  Wt Readings from Last 3  Encounters:  11/16/20 123 lb 8 oz (56 kg)  09/14/20 125 lb (56.7 kg)  09/11/20 127 lb (57.6 kg)       No flowsheet data found.    ASSESSMENT AND PLAN:  1.  Coronary artery disease involving native coronary arteries without angina: Most recent cardiac catheterization in October of last year showed stable coronary findings with chronically occluded RCA with left-to-right collaterals and patent left circumflex stents.  Recommend continuing medical therapy.    2.  Moderate to severe aortic stenosis: Given gradual memory decline, severe anxiety, continued weight loss and poor functional capacity, I agree that she is not a good candidate for TAVR.  3.  Essential hypertension: Blood pressure is elevated today.  Target blood pressure is around 150 mmHg given significant fluctuation as well as underlying severe aortic stenosis.  I made no changes today.  4.  Hyperlipidemia: Continue treatment with high-dose atorvastatin.  Most recent LDL was 29.  5.  Memory decline: Possible early dementia.     Disposition:   Follow-up with me in 6 months.  Signed,  Kathlyn Sacramento, MD  11/16/2020 2:29 PM    [place]

## 2020-11-16 NOTE — Telephone Encounter (Signed)
PT daughter called in regards to the clonazePAM (KLONOPIN) 0.5 MG tablet requesting the refill and a larger quantity since she stated the PT will be taking it twice daily.

## 2020-11-16 NOTE — Patient Instructions (Signed)

## 2020-11-16 NOTE — Telephone Encounter (Signed)
I sent in a refill for 10 tablets. This was supposed to just be for as needed use and not daily use. If she feels like she needs this daily then we need to follow-up to discuss other options for treatment of her anxiety.

## 2020-11-20 NOTE — Telephone Encounter (Signed)
[name] patient's sister is who I spoke with on DPR. Stated that patient is having A LOT of anxiety. She is asking people to stay with her or sit with her. Her son stays with her nights & he will be watching tv in his room & she will ask if son will sit with her in living room. They just saw Dr. Fletcher Anon who according to sister advise that she take it daily if she needs it & is having so much anxiety. It does help patient. Sister is putting klonopin in medication box for her to take the 0.5 tablet BID. Patient's sister stated that anxiety also is effecting BP causing it to elevate. Pt needs something daily to take for the anxiety. Pt scheduled with you Thursday @ 11.

## 2020-11-21 NOTE — Telephone Encounter (Signed)
Noted.  Plan to discuss further at her visit on Thursday.

## 2020-11-22 ENCOUNTER — Other Ambulatory Visit: Payer: Self-pay

## 2020-11-22 ENCOUNTER — Ambulatory Visit (INDEPENDENT_AMBULATORY_CARE_PROVIDER_SITE_OTHER): Payer: Medicare Other | Admitting: Family Medicine

## 2020-11-22 ENCOUNTER — Telehealth: Payer: Self-pay | Admitting: Family Medicine

## 2020-11-22 ENCOUNTER — Encounter: Payer: Self-pay | Admitting: Family Medicine

## 2020-11-22 DIAGNOSIS — F419 Anxiety disorder, unspecified: Secondary | ICD-10-CM | POA: Diagnosis not present

## 2020-11-22 DIAGNOSIS — F32A Depression, unspecified: Secondary | ICD-10-CM

## 2020-11-22 DIAGNOSIS — R350 Frequency of micturition: Secondary | ICD-10-CM | POA: Insufficient documentation

## 2020-11-22 DIAGNOSIS — Z5181 Encounter for therapeutic drug level monitoring: Secondary | ICD-10-CM

## 2020-11-22 DIAGNOSIS — I2510 Atherosclerotic heart disease of native coronary artery without angina pectoris: Secondary | ICD-10-CM

## 2020-11-22 DIAGNOSIS — M545 Low back pain, unspecified: Secondary | ICD-10-CM

## 2020-11-22 LAB — POCT URINALYSIS DIPSTICK
Bilirubin, UA: NEGATIVE
Glucose, UA: NEGATIVE
Ketones, UA: NEGATIVE
Nitrite, UA: POSITIVE
Protein, UA: NEGATIVE
Spec Grav, UA: 1.01 (ref 1.010–1.025)
Urobilinogen, UA: 0.2 E.U./dL
pH, UA: 5.5 (ref 5.0–8.0)

## 2020-11-22 LAB — URINALYSIS, MICROSCOPIC ONLY: RBC / HPF: NONE SEEN (ref 0–?)

## 2020-11-22 MED ORDER — CLONAZEPAM 0.5 MG PO TABS: ORAL_TABLET | ORAL | 0 refills | Status: DC

## 2020-11-22 MED ORDER — DULOXETINE HCL 30 MG PO CPEP: ORAL_CAPSULE | ORAL | 1 refills | Status: DC

## 2020-11-22 NOTE — Assessment & Plan Note (Signed)
We will check a urinalysis to rule out UTI.

## 2020-11-22 NOTE — Assessment & Plan Note (Signed)
I suspect the patient is strained a muscle in her back.  Discussed relative rest.  She can use Tylenol for any discomfort.  She will monitor.

## 2020-11-22 NOTE — Telephone Encounter (Signed)
I called and spoke with the patient/s sister and informed her about the medication change and she understood.  She wanted to wait until her visit to check her sodium level.  Karisma Meiser,cma

## 2020-11-22 NOTE — Patient Instructions (Signed)
Nice to see you. I will talk to our clinical pharmacist and we will let you know what we come up with for medication for anxiety and depression. We are checking a urinalysis and we will let you know what those results are as well. You can use Tylenol over-the-counter for your back pain.

## 2020-11-22 NOTE — Addendum Note (Signed)
Addended by: [name] on: 11/22/2020 02:36 PM   Modules accepted: Orders

## 2020-11-22 NOTE — Telephone Encounter (Signed)
I called and informed the patients sister that the medication was sent to the pharmacy and I scheduled a recheck in 3 weeks.  Mannie Wineland,cma

## 2020-11-22 NOTE — Telephone Encounter (Signed)
Please let the patient know that I would like to switch her from mirtazepine to cymbalta. She would start the cymbalta the day after stopping the mirtazepine. We will need to check labs a few weeks after making this change to ensure that her sodium level is ok. I can send this in for her once you speak with her. Thanks.

## 2020-11-22 NOTE — Assessment & Plan Note (Signed)
The patient has had continued issues with anxiety and depression that are uncontrolled.  I discussed that we need to change medications.  I will discuss with our clinical pharmacist given the patient's prior history of hyponatremia.  I advised that if I am not able to find a medicine that will work for her we will need her to see a psychiatrist to get their input and management of her symptoms.  We can try clonazepam 0.5 mg twice daily.  Advised of the risk of drowsiness.  I did discuss this is not a long-term solution for her symptoms.

## 2020-11-22 NOTE — Progress Notes (Signed)
[name], MD Phone: [phone]  Amy Morton is a 86 y.o. female who presents today for f/u.  Anxiety: Patient has ongoing issues with this.  She does somewhat feel depressed at times.  She denies SI.  She has been on Remeron.  She has been taking clonazepam 0.25 mg twice daily over the last week and has not noticed much of a difference with taking this.  We tried BuSpar with no benefit.  She had hyponatremia related to the Celexa.  Its gotten to the point where her son stays with her at night. They note she cries at times when she is by herself.  She starts to feel "knotted up" once the sun goes down.  She is not driving.  She notes no life changes that would contribute to this.  Back pain: She woke up this morning with a pulling sensation in her right low back.  She had no pain yesterday.  She wonders if she lifted something yesterday though cannot remember doing anything specific that would cause this.  No injury.  No radiation.  No numbness.  No weakness.  No incontinence.  She has not taken any medications for this.    Urine frequency: They do report UTI about a month ago though the patient has no dysuria.  She said no urgency though does report some frequency that is new.  Social History   Tobacco Use  Smoking Status Never Smoker  Smokeless Tobacco Never Used    Current Outpatient Medications on File Prior to Visit  Medication Sig Dispense Refill  . acetaminophen (TYLENOL) 500 MG tablet Take 1,000 mg by mouth in the morning and at bedtime.    . Ascorbic Acid (VITAMIN C PO) Take by mouth.    Marland Kitchen aspirin 81 MG tablet Take 81 mg by mouth daily.    Marland Kitchen atorvastatin (LIPITOR) 40 MG tablet Take 1 tablet (40 mg total) by mouth daily. 90 tablet 3  . Cholecalciferol 5000 units TABS Take 5,000 Units by mouth daily.    . clopidogrel (PLAVIX) 75 MG tablet TAKE 1 TABLET BY MOUTH DAILY WITH BREAKFAST 90 tablet 0  . colestipol (COLESTID) 1 g tablet TAKE 1 TABLET(1 GRAM) BY MOUTH DAILY  90 tablet 1  . isosorbide mononitrate (IMDUR) 30 MG 24 hr tablet Take 1.5 tablets (45 mg total) by mouth daily. 135 tablet 0  . metoprolol tartrate (LOPRESSOR) 50 MG tablet TAKE 1 TABLET(50 MG) BY MOUTH TWICE DAILY 180 tablet 0  . mirtazapine (REMERON) 45 MG tablet TAKE 1 TABLET BY MOUTH EVERY NIGHT AT BEDTIME 30 tablet 3  . Multiple Vitamin (MULTIVITAMIN) capsule Take 1 capsule by mouth daily.    . nitroGLYCERIN (NITROSTAT) 0.4 MG SL tablet PLACE 1 TABLET UNDER THE TONGUE EVERY 5 MINUTES AS NEEDED FOR CHEST PAIN, 3 DOSES MAX 25 tablet 1  . Nutritional Supplements (OSTEO ADVANCE) TABS Take 1 tablet by mouth daily.     Marland Kitchen omega-3 acid ethyl esters (LOVAZA) 1 g capsule TAKE 2 CAPSULES BY MOUTH TWICE DAILY 360 capsule 1  . pantoprazole (PROTONIX) 40 MG tablet TAKE 1 TABLET(40 MG) BY MOUTH DAILY 90 tablet 0  . Ubiquinol 100 MG CAPS Take 100 mg by mouth every morning.    . vitamin B-12 (CYANOCOBALAMIN) 250 MCG tablet Take 250 mcg by mouth daily.     Current Facility-Administered Medications on File Prior to Visit  Medication Dose Route Frequency Provider Last Rate Last Admin  . sodium chloride 0.9 % nebulizer solution 3 mL  3 mL Nebulization Once [name], MD       Followed by  . methacholine (PROVOCHOLINE) inhaler solution 0.125 mg  2 mL Inhalation Once [name], MD       Followed by  . methacholine (PROVOCHOLINE) inhaler solution 0.5 mg  2 mL Inhalation Once [name], MD       Followed by  . methacholine (PROVOCHOLINE) inhaler solution 2 mg  2 mL Inhalation Once [name], MD       Followed by  . methacholine (PROVOCHOLINE) inhaler solution 8 mg  2 mL Inhalation Once [name], MD       Followed by  . methacholine (PROVOCHOLINE) inhaler solution 32 mg  2 mL Inhalation Once [name], MD         ROS see history of present illness  Objective  Physical Exam Vitals:   11/22/20 1105  BP: 140/80  Pulse: 76  Temp: (!) 95.8 F (35.4  C)  SpO2: 98%    BP Readings from Last 3 Encounters:  11/22/20 140/80  11/16/20 (!) 162/54  09/12/20 (!) 175/95   Wt Readings from Last 3 Encounters:  11/22/20 126 lb (57.2 kg)  11/16/20 123 lb 8 oz (56 kg)  09/14/20 125 lb (56.7 kg)    Physical Exam Constitutional:      General: She is not in acute distress.    Appearance: She is not diaphoretic.  Cardiovascular:     Rate and Rhythm: Normal rate and regular rhythm.     Heart sounds: Murmur (3/6 systolic) heard.    Pulmonary:     Effort: Pulmonary effort is normal.     Breath sounds: Normal breath sounds.  Musculoskeletal:     Comments: No midline spine tenderness, no midline spine step-off, there is right lumbar muscular back tenderness around the top of her SI joint, no overlying skin changes  Skin:    General: Skin is warm and dry.  Neurological:     Mental Status: She is alert.      Assessment/Plan: Please see individual problem list.  Problem List Items Addressed This Visit    Anxiety and depression    The patient has had continued issues with anxiety and depression that are uncontrolled.  I discussed that we need to change medications.  I will discuss with our clinical pharmacist given the patient's prior history of hyponatremia.  I advised that if I am not able to find a medicine that will work for her we will need her to see a psychiatrist to get their input and management of her symptoms.  We can try clonazepam 0.5 mg twice daily.  Advised of the risk of drowsiness.  I did discuss this is not a long-term solution for her symptoms.      Relevant Medications   clonazePAM (KLONOPIN) 0.5 MG tablet   Urinary frequency    We will check a urinalysis to rule out UTI.      Relevant Orders   POCT Urinalysis Dipstick (Completed)   Urine Culture   Urine Microscopic   Low back pain    I suspect the patient is strained a muscle in her back.  Discussed relative rest.  She can use Tylenol for any discomfort.  She will  monitor.         This visit occurred during the SARS-CoV-2 public health emergency.  Safety protocols were in place, including screening questions prior to the visit, additional usage of staff PPE, and  extensive cleaning of exam room while observing appropriate contact time as indicated for disinfecting solutions.    [name], MD [place]

## 2020-11-22 NOTE — Telephone Encounter (Signed)
I sent the Cymbalta into the pharmacy.  She really needs to have her sodium rechecked sooner than 6 weeks from now.  It should be done about 3 weeks after starting the Cymbalta.  Please try to reinforce this with her.

## 2020-11-24 LAB — URINE CULTURE
MICRO NUMBER:: 11826509
SPECIMEN QUALITY:: ADEQUATE

## 2020-11-25 ENCOUNTER — Other Ambulatory Visit: Payer: Self-pay | Admitting: Family Medicine

## 2020-11-25 MED ORDER — CEPHALEXIN 500 MG PO CAPS: 500.0000 mg | ORAL_CAPSULE | Freq: Four times a day (QID) | ORAL | 0 refills | Status: AC

## 2020-11-27 ENCOUNTER — Encounter: Payer: Self-pay | Admitting: Family Medicine

## 2020-11-28 NOTE — Telephone Encounter (Signed)
I agree with the need to decrease the klonopin to 0.25 mg twice daily as needed. Please follow-up with the patient and her sister to see how she is doing today.

## 2020-11-29 NOTE — Telephone Encounter (Signed)
Noted  

## 2020-12-01 ENCOUNTER — Other Ambulatory Visit: Payer: Self-pay | Admitting: Cardiovascular Disease

## 2020-12-01 DIAGNOSIS — I10 Essential (primary) hypertension: Secondary | ICD-10-CM

## 2020-12-03 NOTE — Telephone Encounter (Signed)
Rx request sent to pharmacy.  

## 2020-12-05 ENCOUNTER — Observation Stay (HOSPITAL_BASED_OUTPATIENT_CLINIC_OR_DEPARTMENT_OTHER)
Admit: 2020-12-05 | Discharge: 2020-12-05 | Disposition: A | Discharge status: Home / Self Care | Payer: Medicare Other | Attending: Internal Medicine | Admitting: Internal Medicine

## 2020-12-05 ENCOUNTER — Other Ambulatory Visit: Payer: Self-pay

## 2020-12-05 ENCOUNTER — Observation Stay
Admission: EM | Admit: 2020-12-05 | Discharge: 2020-12-06 | Disposition: A | Discharge status: Home / Self Care | Payer: Medicare Other | Attending: Internal Medicine | Admitting: Internal Medicine

## 2020-12-05 ENCOUNTER — Emergency Department: Payer: Medicare Other

## 2020-12-05 DIAGNOSIS — F419 Anxiety disorder, unspecified: Secondary | ICD-10-CM | POA: Diagnosis not present

## 2020-12-05 DIAGNOSIS — I350 Nonrheumatic aortic (valve) stenosis: Secondary | ICD-10-CM

## 2020-12-05 DIAGNOSIS — J9811 Atelectasis: Secondary | ICD-10-CM | POA: Diagnosis not present

## 2020-12-05 DIAGNOSIS — J45909 Unspecified asthma, uncomplicated: Secondary | ICD-10-CM | POA: Diagnosis not present

## 2020-12-05 DIAGNOSIS — R55 Syncope and collapse: Secondary | ICD-10-CM | POA: Diagnosis not present

## 2020-12-05 DIAGNOSIS — M47812 Spondylosis without myelopathy or radiculopathy, cervical region: Secondary | ICD-10-CM | POA: Diagnosis not present

## 2020-12-05 DIAGNOSIS — R7989 Other specified abnormal findings of blood chemistry: Secondary | ICD-10-CM | POA: Diagnosis present

## 2020-12-05 DIAGNOSIS — W19XXXA Unspecified fall, initial encounter: Secondary | ICD-10-CM | POA: Diagnosis not present

## 2020-12-05 DIAGNOSIS — K5750 Diverticulosis of both small and large intestine without perforation or abscess without bleeding: Secondary | ICD-10-CM | POA: Diagnosis not present

## 2020-12-05 DIAGNOSIS — J449 Chronic obstructive pulmonary disease, unspecified: Secondary | ICD-10-CM | POA: Insufficient documentation

## 2020-12-05 DIAGNOSIS — R778 Other specified abnormalities of plasma proteins: Secondary | ICD-10-CM | POA: Diagnosis not present

## 2020-12-05 DIAGNOSIS — I700 Atherosclerosis of aorta: Secondary | ICD-10-CM | POA: Diagnosis not present

## 2020-12-05 DIAGNOSIS — E876 Hypokalemia: Secondary | ICD-10-CM | POA: Diagnosis not present

## 2020-12-05 DIAGNOSIS — E871 Hypo-osmolality and hyponatremia: Secondary | ICD-10-CM | POA: Diagnosis present

## 2020-12-05 DIAGNOSIS — N390 Urinary tract infection, site not specified: Secondary | ICD-10-CM | POA: Diagnosis present

## 2020-12-05 DIAGNOSIS — G319 Degenerative disease of nervous system, unspecified: Secondary | ICD-10-CM | POA: Diagnosis not present

## 2020-12-05 DIAGNOSIS — B9620 Unspecified Escherichia coli [E. coli] as the cause of diseases classified elsewhere: Secondary | ICD-10-CM | POA: Diagnosis not present

## 2020-12-05 DIAGNOSIS — Z7982 Long term (current) use of aspirin: Secondary | ICD-10-CM | POA: Diagnosis not present

## 2020-12-05 DIAGNOSIS — S22030A Wedge compression fracture of third thoracic vertebra, initial encounter for closed fracture: Secondary | ICD-10-CM | POA: Diagnosis not present

## 2020-12-05 DIAGNOSIS — R1084 Generalized abdominal pain: Secondary | ICD-10-CM | POA: Diagnosis not present

## 2020-12-05 DIAGNOSIS — R911 Solitary pulmonary nodule: Secondary | ICD-10-CM | POA: Diagnosis not present

## 2020-12-05 DIAGNOSIS — F32A Depression, unspecified: Secondary | ICD-10-CM | POA: Diagnosis not present

## 2020-12-05 DIAGNOSIS — Z20822 Contact with and (suspected) exposure to covid-19: Secondary | ICD-10-CM | POA: Diagnosis not present

## 2020-12-05 DIAGNOSIS — R0902 Hypoxemia: Secondary | ICD-10-CM | POA: Diagnosis not present

## 2020-12-05 DIAGNOSIS — I2510 Atherosclerotic heart disease of native coronary artery without angina pectoris: Secondary | ICD-10-CM | POA: Diagnosis present

## 2020-12-05 DIAGNOSIS — I739 Peripheral vascular disease, unspecified: Secondary | ICD-10-CM | POA: Diagnosis not present

## 2020-12-05 DIAGNOSIS — Z79899 Other long term (current) drug therapy: Secondary | ICD-10-CM | POA: Insufficient documentation

## 2020-12-05 DIAGNOSIS — S24109A Unspecified injury at unspecified level of thoracic spinal cord, initial encounter: Secondary | ICD-10-CM | POA: Diagnosis present

## 2020-12-05 DIAGNOSIS — I10 Essential (primary) hypertension: Secondary | ICD-10-CM | POA: Diagnosis present

## 2020-12-05 DIAGNOSIS — N3001 Acute cystitis with hematuria: Secondary | ICD-10-CM

## 2020-12-05 DIAGNOSIS — R1033 Periumbilical pain: Secondary | ICD-10-CM | POA: Insufficient documentation

## 2020-12-05 DIAGNOSIS — M47814 Spondylosis without myelopathy or radiculopathy, thoracic region: Secondary | ICD-10-CM | POA: Diagnosis not present

## 2020-12-05 DIAGNOSIS — I340 Nonrheumatic mitral (valve) insufficiency: Secondary | ICD-10-CM

## 2020-12-05 DIAGNOSIS — Z043 Encounter for examination and observation following other accident: Secondary | ICD-10-CM | POA: Diagnosis not present

## 2020-12-05 DIAGNOSIS — R1013 Epigastric pain: Secondary | ICD-10-CM | POA: Diagnosis not present

## 2020-12-05 LAB — URINALYSIS, COMPLETE (UACMP) WITH MICROSCOPIC
Bilirubin Urine: NEGATIVE
Glucose, UA: NEGATIVE mg/dL
Ketones, ur: 20 mg/dL — AB
Nitrite: POSITIVE — AB
Protein, ur: 100 mg/dL — AB
Specific Gravity, Urine: 1.009 (ref 1.005–1.030)
pH: 6 (ref 5.0–8.0)

## 2020-12-05 LAB — COMPREHENSIVE METABOLIC PANEL
ALT: 21 U/L (ref 0–44)
AST: 35 U/L (ref 15–41)
Albumin: 4.3 g/dL (ref 3.5–5.0)
Alkaline Phosphatase: 44 U/L (ref 38–126)
Anion gap: 13 (ref 5–15)
BUN: 9 mg/dL (ref 8–23)
CO2: 26 mmol/L (ref 22–32)
Calcium: 9.2 mg/dL (ref 8.9–10.3)
Chloride: 91 mmol/L — ABNORMAL LOW (ref 98–111)
Creatinine, Ser: 0.65 mg/dL (ref 0.44–1.00)
GFR, Estimated: >60 mL/min (ref >60)
Glucose, Bld: 139 mg/dL — ABNORMAL HIGH (ref 70–99)
Potassium: 3 mmol/L — ABNORMAL LOW (ref 3.5–5.1)
Sodium: 130 mmol/L — ABNORMAL LOW (ref 135–145)
Total Bilirubin: 1 mg/dL (ref 0.3–1.2)
Total Protein: 7.8 g/dL (ref 6.5–8.1)

## 2020-12-05 LAB — TROPONIN I (HIGH SENSITIVITY)
Troponin I (High Sensitivity): 53 ng/L — ABNORMAL HIGH (ref <18)
Troponin I (High Sensitivity): 95 ng/L — ABNORMAL HIGH (ref <18)
Troponin I (High Sensitivity): 117 ng/L (ref <18)
Troponin I (High Sensitivity): 143 ng/L (ref <18)

## 2020-12-05 LAB — ECHOCARDIOGRAM COMPLETE
AR max vel: 1.09 cm2
AV Area VTI: 1 cm2
AV Area mean vel: 1.14 cm2
AV Mean grad: 16.8 mmHg
AV Peak grad: 27.2 mmHg
Ao pk vel: 2.61 m/s
Area-P 1/2: 5.13 cm2
Height: 59 in
S' Lateral: 2.17 cm
Weight: 2229.29 oz

## 2020-12-05 LAB — CBC WITH DIFFERENTIAL/PLATELET
Abs Immature Granulocytes: 0.08 10*3/uL — ABNORMAL HIGH (ref 0.00–0.07)
Basophils Absolute: 0 10*3/uL (ref 0.0–0.1)
Basophils Relative: 0 %
Eosinophils Absolute: 0 10*3/uL (ref 0.0–0.5)
Eosinophils Relative: 0 %
HCT: 37.2 % (ref 36.0–46.0)
Hemoglobin: 13.3 g/dL (ref 12.0–15.0)
Immature Granulocytes: 1 %
Lymphocytes Relative: 8 %
Lymphs Abs: 1.1 10*3/uL (ref 0.7–4.0)
MCH: 31.7 pg (ref 26.0–34.0)
MCHC: 35.8 g/dL (ref 30.0–36.0)
MCV: 88.8 fL (ref 80.0–100.0)
Monocytes Absolute: 0.8 10*3/uL (ref 0.1–1.0)
Monocytes Relative: 5 %
Neutro Abs: 12.5 10*3/uL — ABNORMAL HIGH (ref 1.7–7.7)
Neutrophils Relative %: 86 %
Platelets: 192 10*3/uL (ref 150–400)
RBC: 4.19 MIL/uL (ref 3.87–5.11)
RDW: 11.6 % (ref 11.5–15.5)
WBC: 14.5 10*3/uL — ABNORMAL HIGH (ref 4.0–10.5)
nRBC: 0 % (ref 0.0–0.2)

## 2020-12-05 LAB — RESP PANEL BY RT-PCR (FLU A&B, COVID) ARPGX2
Influenza A by PCR: NEGATIVE
Influenza B by PCR: NEGATIVE
SARS Coronavirus 2 by RT PCR: NEGATIVE

## 2020-12-05 LAB — MAGNESIUM: Magnesium: 1 mg/dL — ABNORMAL LOW (ref 1.7–2.4)

## 2020-12-05 LAB — CK: Total CK: 319 U/L — ABNORMAL HIGH (ref 38–234)

## 2020-12-05 MED ORDER — TRAMADOL HCL 50 MG PO TABS: 50.0000 mg | ORAL_TABLET | Freq: Four times a day (QID) | ORAL | Status: DC | PRN

## 2020-12-05 MED ORDER — ATORVASTATIN CALCIUM 20 MG PO TABS
40.0000 mg | ORAL_TABLET | Freq: Every day | ORAL | Status: DC
  Administered 2020-12-05: 40 mg via ORAL
  Filled 2020-12-05: qty 2

## 2020-12-05 MED ORDER — OMEGA-3-ACID ETHYL ESTERS 1 G PO CAPS
2.0000 | ORAL_CAPSULE | Freq: Two times a day (BID) | ORAL | Status: DC
  Administered 2020-12-05 – 2020-12-06 (×2): 2 g via ORAL
  Filled 2020-12-05 (×3): qty 2

## 2020-12-05 MED ORDER — ASPIRIN EC 81 MG PO TBEC
81.0000 mg | DELAYED_RELEASE_TABLET | Freq: Every day | ORAL | Status: DC
  Administered 2020-12-06: 81 mg via ORAL
  Filled 2020-12-05: qty 1

## 2020-12-05 MED ORDER — METOPROLOL TARTRATE 50 MG PO TABS
50.0000 mg | ORAL_TABLET | Freq: Two times a day (BID) | ORAL | Status: DC
  Administered 2020-12-05 – 2020-12-06 (×2): 50 mg via ORAL
  Filled 2020-12-05 (×2): qty 1

## 2020-12-05 MED ORDER — ISOSORBIDE MONONITRATE ER 30 MG PO TB24
45.0000 mg | ORAL_TABLET | Freq: Every day | ORAL | Status: DC
  Administered 2020-12-05 – 2020-12-06 (×2): 45 mg via ORAL
  Filled 2020-12-05 (×3): qty 2

## 2020-12-05 MED ORDER — POTASSIUM CHLORIDE CRYS ER 20 MEQ PO TBCR
40.0000 meq | EXTENDED_RELEASE_TABLET | Freq: Once | ORAL | Status: AC
  Administered 2020-12-05: 40 meq via ORAL
  Filled 2020-12-05: qty 2

## 2020-12-05 MED ORDER — NITROGLYCERIN 0.4 MG SL SUBL: 0.4000 mg | SUBLINGUAL_TABLET | SUBLINGUAL | Status: DC | PRN

## 2020-12-05 MED ORDER — CEPHALEXIN 500 MG PO CAPS
500.0000 mg | ORAL_CAPSULE | Freq: Once | ORAL | Status: AC
  Administered 2020-12-05: 500 mg via ORAL
  Filled 2020-12-05: qty 1

## 2020-12-05 MED ORDER — MULTIVITAMINS PO CAPS: 1.0000 | ORAL_CAPSULE | Freq: Every day | ORAL | Status: DC

## 2020-12-05 MED ORDER — DOCUSATE SODIUM 100 MG PO CAPS
100.0000 mg | ORAL_CAPSULE | Freq: Two times a day (BID) | ORAL | Status: DC
  Administered 2020-12-05: 100 mg via ORAL
  Filled 2020-12-05: qty 1

## 2020-12-05 MED ORDER — DULOXETINE HCL 30 MG PO CPEP
60.0000 mg | ORAL_CAPSULE | Freq: Every day | ORAL | Status: DC
  Administered 2020-12-06: 60 mg via ORAL
  Filled 2020-12-05: qty 2

## 2020-12-05 MED ORDER — ACETAMINOPHEN 500 MG PO TABS
1000.0000 mg | ORAL_TABLET | Freq: Two times a day (BID) | ORAL | Status: DC
  Administered 2020-12-05 – 2020-12-06 (×2): 1000 mg via ORAL
  Filled 2020-12-05 (×2): qty 2

## 2020-12-05 MED ORDER — COLESTIPOL HCL 1 G PO TABS
1.0000 g | ORAL_TABLET | Freq: Every day | ORAL | Status: DC
  Administered 2020-12-06: 1 g via ORAL
  Filled 2020-12-05 (×3): qty 1

## 2020-12-05 MED ORDER — ONDANSETRON HCL 4 MG/2ML IJ SOLN: 4.0000 mg | Freq: Four times a day (QID) | INTRAMUSCULAR | Status: DC | PRN

## 2020-12-05 MED ORDER — METOPROLOL TARTRATE 50 MG PO TABS
50.0000 mg | ORAL_TABLET | Freq: Once | ORAL | Status: AC
  Administered 2020-12-05: 50 mg via ORAL
  Filled 2020-12-05: qty 1

## 2020-12-05 MED ORDER — CLONAZEPAM 0.5 MG PO TABS: 0.5000 mg | ORAL_TABLET | Freq: Two times a day (BID) | ORAL | Status: DC | PRN

## 2020-12-05 MED ORDER — ADULT MULTIVITAMIN W/MINERALS CH
1.0000 | ORAL_TABLET | Freq: Every day | ORAL | Status: DC
  Administered 2020-12-06: 1 via ORAL
  Filled 2020-12-05: qty 1

## 2020-12-05 MED ORDER — PANTOPRAZOLE SODIUM 40 MG PO TBEC
40.0000 mg | DELAYED_RELEASE_TABLET | Freq: Every day | ORAL | Status: DC
  Administered 2020-12-06: 40 mg via ORAL
  Filled 2020-12-05: qty 1

## 2020-12-05 MED ORDER — CYANOCOBALAMIN 500 MCG PO TABS
250.0000 ug | ORAL_TABLET | Freq: Every day | ORAL | Status: DC
  Administered 2020-12-06: 250 ug via ORAL
  Filled 2020-12-05 (×3): qty 1

## 2020-12-05 MED ORDER — SODIUM CHLORIDE 0.9 % IV SOLN
1.0000 g | INTRAVENOUS | Status: DC
  Administered 2020-12-05: 1 g via INTRAVENOUS
  Filled 2020-12-05: qty 1
  Filled 2020-12-05: qty 10

## 2020-12-05 MED ORDER — ENOXAPARIN SODIUM 40 MG/0.4ML IJ SOSY
40.0000 mg | PREFILLED_SYRINGE | INTRAMUSCULAR | Status: DC
  Administered 2020-12-05: 40 mg via SUBCUTANEOUS
  Filled 2020-12-05: qty 0.4

## 2020-12-05 MED ORDER — ASCORBIC ACID 500 MG PO TABS
500.0000 mg | ORAL_TABLET | Freq: Every day | ORAL | Status: DC
  Administered 2020-12-06: 500 mg via ORAL
  Filled 2020-12-05: qty 1

## 2020-12-05 MED ORDER — CLOPIDOGREL BISULFATE 75 MG PO TABS
75.0000 mg | ORAL_TABLET | Freq: Every day | ORAL | Status: DC
  Administered 2020-12-06: 75 mg via ORAL
  Filled 2020-12-05: qty 1

## 2020-12-05 MED ORDER — SODIUM CHLORIDE 0.9% FLUSH
3.0000 mL | Freq: Two times a day (BID) | INTRAVENOUS | Status: DC
  Administered 2020-12-05 – 2020-12-06 (×3): 3 mL via INTRAVENOUS

## 2020-12-05 MED ORDER — ACETAMINOPHEN 500 MG PO TABS
1000.0000 mg | ORAL_TABLET | Freq: Once | ORAL | Status: AC
  Administered 2020-12-05: 1000 mg via ORAL
  Filled 2020-12-05: qty 2

## 2020-12-05 MED ORDER — ONDANSETRON HCL 4 MG PO TABS: 4.0000 mg | ORAL_TABLET | Freq: Four times a day (QID) | ORAL | Status: DC | PRN

## 2020-12-05 MED ORDER — UBIQUINOL 100 MG PO CAPS: 100.0000 mg | ORAL_CAPSULE | Freq: Every morning | ORAL | Status: DC

## 2020-12-05 MED ORDER — VITAMIN D 25 MCG (1000 UNIT) PO TABS
5000.0000 [IU] | ORAL_TABLET | Freq: Every day | ORAL | Status: DC
  Administered 2020-12-06: 5000 [IU] via ORAL
  Filled 2020-12-05: qty 5

## 2020-12-05 NOTE — ED Provider Notes (Signed)
[facility] Emergency Department Provider Note ____________________________________________   Event Date/Time   First MD Initiated Contact with Patient 12/05/20 [phone]     (approximate)  I have reviewed the triage vital signs and the nursing notes.  HISTORY  Chief Complaint Fall   HPI Amy Morton is a 86 y.o. femalewho presents to the ED for evaluation of unwitnessed fall.  Chart review indicates history of aortic stenosis, HTN and CAD s/p stenting.  Declining memory and possible dementia.  HTN, HLD.  Patient on DAPT with Plavix.  Resides at home with son.  Patient arrived to the ED for evaluation after an unwitnessed fall and possible syncopal episode that occurred this morning in the bathroom.  Patient reports feeling fine yesterday, and feeling normal this morning.  She reports going to the restroom to void and then is uncertain what happened after this.  EMS reports that the patient's son found her sprawled in the bathtub, concern that she fell into the tub after getting up from the toilet.  Patient reports feeling fine and has no pain while laying supine, she reports some epigastric discomfort when she leans forward as her only other complaint.   Past Medical History:  Diagnosis Date  . Abnormal mammogram 2007   Recommend close follow up  . Anemia    Iron, b12, SPEP, UPEP normal, 12/2012  . Anxiety   . Aortic stenosis    Severe by [facility] 05/2020  . aortic stenosis   . Arthritis   . Asthma   . CAD (coronary artery disease)    s/p angioplasty 1996  . Chronic cough   . Depression   . Diverticulosis   . Dyspnea   . Elevated LFTs 2010   s/p ultrasound w/ possible fatty liver; GI consult. Resolution 2011  . Gastric ulcer   . GERD (gastroesophageal reflux disease)   . History of colonic polyps    Dr. Vira Agar  . HOH (hard of hearing)   . Hyperlipidemia   . Hypertension   . Peritoneal carcinomatosis [facility]) 2001   Dr. Oliva Bustard & Dr. Claiborne Rigg s/p chemo  . Shingles 2008   T-9 distribution  . Valvular heart disease    Mild to Moderate MR, AS  . Vitamin D deficiency   . Wheezing     Patient Active Problem List   Diagnosis Date Noted  . Urinary frequency 11/22/2020  . Low back pain 11/22/2020  . UTI (urinary tract infection) 09/14/2020  . Heart valve disease 08/23/2020  . Head injury 08/20/2020  . History of CVA (cerebrovascular accident)   . Numbness 06/05/2020  . Hot flashes 02/10/2020  . Asthma without status asthmaticus 12/15/2019  . Essential hypertension 12/15/2019  . Prediabetes 02/11/2019  . Memory difficulty 02/08/2018  . Angina pectoris ([place]) 01/22/2018  . Non-rheumatic mitral regurgitation 02/12/2017  . Orthostasis 10/20/2016  . Arthritis 05/22/2016  . COPD (chronic obstructive pulmonary disease) ([place]) 11/21/2015  . Hyponatremia 09/30/2015  . CAD (coronary artery disease) 04/27/2015  . Complex tear of medial meniscus of right knee as current injury 07/26/2014  . Primary osteoarthritis of right knee 07/26/2014  . Anemia 05/12/2014  . Aortic valve stenosis 04/18/2014  . History of peritoneal carcinoma 04/17/2014  . Chronic diarrhea 04/17/2014  . Hyperlipidemia 04/17/2014  . Anxiety and depression 11/13/2013  . Vitamin D deficiency 11/13/2013    Past Surgical History:  Procedure Laterality Date  . ABDOMINAL HYSTERECTOMY  2001  . APPENDECTOMY    . CARDIAC CATHETERIZATION  2007   50% mid LAD stenosis, 70% proximal RCA with 100% distal RCA stenosis with collaterals, EF 65%  . CARDIAC CATHETERIZATION N/A 04/27/2015   Procedure: Left Heart Cath and Coronary Angiography;  Surgeon: Jettie Booze, MD;  Location: [place] CV LAB;  Service: Cardiovascular;  Laterality: N/A;  . CARDIAC CATHETERIZATION N/A 04/27/2015   Procedure: Coronary Stent Intervention;  Surgeon: Jettie Booze, MD;  Location: [place] [facility];  Service: Cardiovascular;  Laterality: N/A;  . CATARACT EXTRACTION W/PHACO  Left 09/25/2016   Procedure: CATARACT EXTRACTION PHACO AND INTRAOCULAR LENS PLACEMENT (IOC);  Surgeon: Eulogio Bear, MD;  Location: [facility] ORS;  Service: Ophthalmology;  Laterality: Left;  [place] 46.7 AP% 8.1 CDE 3.80 Fluid pack lot # [id number]  . CATARACT EXTRACTION W/PHACO Right 11/06/2016   Procedure: CATARACT EXTRACTION PHACO AND INTRAOCULAR LENS PLACEMENT ([place]);  Surgeon: Eulogio Bear, MD;  Location: [facility] ORS;  Service: Ophthalmology;  Laterality: Right;  [place] 49.5 AP8.7 CDE4.28 LOT # [id number]  . CHOLECYSTECTOMY  1987  . COLECTOMY  2001  . CORONARY ANGIOPLASTY  1996  . CORONARY ANGIOPLASTY    . LAPAROSCOPIC BILATERAL SALPINGO OOPHERECTOMY  1988   Dr. Laverta Baltimore  . LEFT HEART CATH AND CORONARY ANGIOGRAPHY N/A 01/25/2018   Procedure: LEFT HEART CATH AND CORONARY ANGIOGRAPHY;  Surgeon: Wellington Hampshire, MD;  Location: [place] [facility];  Service: Cardiovascular;  Laterality: N/A;  . RIGHT/LEFT HEART CATH AND CORONARY ANGIOGRAPHY N/A 05/16/2020   Procedure: RIGHT/LEFT HEART CATH AND CORONARY ANGIOGRAPHY;  Surgeon: Wellington Hampshire, MD;  Location: [place] [facility];  Service: Cardiovascular;  Laterality: N/A;  . TONSILLECTOMY  1950    Prior to Admission medications   Medication Sig Start Date End Date Taking? Authorizing Provider  acetaminophen (TYLENOL) 500 MG tablet Take 1,000 mg by mouth in the morning and at bedtime.    [provider]  Ascorbic Acid (VITAMIN C PO) Take by mouth.    [provider]  aspirin 81 MG tablet Take 81 mg by mouth daily.    [provider]  atorvastatin (LIPITOR) 40 MG tablet Take 1 tablet (40 mg total) by mouth daily. 07/18/20 07/13/21  Loel Dubonnet, NP  Cholecalciferol 5000 units TABS Take 5,000 Units by mouth daily.    [provider]  clonazePAM (KLONOPIN) 0.5 MG tablet TAKE 1 TABLET(0.5 MG) BY MOUTH TWICE DAILY AS NEEDED FOR ANXIETY 11/22/20   [name], MD  clopidogrel (PLAVIX) 75 MG tablet TAKE 1 TABLET  BY MOUTH DAILY WITH BREAKFAST 12/03/20   Wellington Hampshire, MD  colestipol (COLESTID) 1 g tablet TAKE 1 TABLET(1 GRAM) BY MOUTH DAILY 11/07/20   [name], MD  DULoxetine (CYMBALTA) 30 MG capsule Take 1 capsule (30 mg total) by mouth daily for 14 days, THEN 2 capsules (60 mg total) daily. 11/22/20 02/20/21  [name], MD  isosorbide mononitrate (IMDUR) 30 MG 24 hr tablet TAKE 1 AND 1/2 TABLETS(45 MG) BY MOUTH DAILY 12/03/20   Wellington Hampshire, MD  metoprolol tartrate (LOPRESSOR) 50 MG tablet TAKE 1 TABLET(50 MG) BY MOUTH TWICE DAILY 12/03/20   Wellington Hampshire, MD  Multiple Vitamin (MULTIVITAMIN) capsule Take 1 capsule by mouth daily.    [provider]  nitroGLYCERIN (NITROSTAT) 0.4 MG SL tablet PLACE 1 TABLET UNDER THE TONGUE EVERY 5 MINUTES AS NEEDED FOR CHEST PAIN, 3 DOSES MAX 11/24/19   Wellington Hampshire, MD  Nutritional Supplements (OSTEO ADVANCE) TABS Take 1 tablet by  mouth daily.     [provider]  omega-3 acid ethyl esters (LOVAZA) 1 g capsule TAKE 2 CAPSULES BY MOUTH TWICE DAILY 06/11/20   Einar Pheasant, MD  pantoprazole (PROTONIX) 40 MG tablet TAKE 1 TABLET(40 MG) BY MOUTH DAILY 12/03/20   Wellington Hampshire, MD  Ubiquinol 100 MG CAPS Take 100 mg by mouth every morning.    [provider]  vitamin B-12 (CYANOCOBALAMIN) 250 MCG tablet Take 250 mcg by mouth daily.    [provider]    Allergies Abilify [aripiprazole], Celexa [citalopram], and Ibuprofen  Family History  Problem Relation Age of Onset  . Hypertension Mother   . Stroke Mother 53       Cerebral Hemorrhage  . Cancer Father        bone cancer  . Diabetes Sister   . Diabetes Brother   . Heart disease Brother   . Cancer Daughter 3       Ovarian cancer  . Colon cancer Neg Hx   . Colon polyps Neg Hx     Social History Social History   Tobacco Use  . Smoking status: Never Smoker  . Smokeless tobacco: Never Used  Vaping Use  . Vaping Use: Never used  Substance Use  Topics  . Alcohol use: No    Alcohol/week: 0.0 standard drinks  . Drug use: No    Review of Systems  Constitutional: No fever/chills Eyes: No visual changes. ENT: No sore throat. Cardiovascular: Denies chest pain. Respiratory: Denies shortness of breath. Gastrointestinal: Positional epigastric discomfort   No nausea, no vomiting.  No diarrhea.  No constipation. Genitourinary: Negative for dysuria. Musculoskeletal: Negative for back pain. Skin: Negative for rash. Neurological: Negative for headaches, focal weakness or numbness.  ____________________________________________   PHYSICAL EXAM:  VITAL SIGNS: Vitals:   12/05/20 1200 12/05/20 1215  BP: (!) 185/92   Pulse: 86 87  Resp: (!) 25 17  Temp:    SpO2: 94% 95%     Constitutional: Alert and oriented to person, location, situation and year. Well appearing and in no acute distress. Eyes: Conjunctivae are normal. PERRL. EOMI. Head: Atraumatic. Nose: No congestion/rhinnorhea. Mouth/Throat: Mucous membranes are moist.  Oropharynx non-erythematous. Neck: No stridor. No cervical spine tenderness to palpation. Cardiovascular: Normal rate, regular rhythm. Grossly normal heart sounds.  Good peripheral circulation. Respiratory: Normal respiratory effort.  No retractions. Lungs CTAB. Gastrointestinal: Soft , nondistended. No CVA tenderness. No signs of trauma to the abdomen, bruising.  Mild tenderness to periumbilical abdomen, lesser to the epigastrium and suprapubic abdomen.  No peritoneal features. Musculoskeletal: No lower extremity tenderness nor edema.  No joint effusions. Midline spinal tenderness to lower cervical and upper thoracic spine without step-offs, signs of trauma to the back. Palpation of all 4 extremities otherwise without tenderness, signs of trauma or deformity. Neurologic:  Normal speech and language. No gross focal neurologic deficits are appreciated. No gait instability noted. Cranial nerves II through XII  intact 5/5 strength and sensation in all 4 extremities Skin:  Skin is warm, dry and intact. No rash noted. Psychiatric: Mood and affect are normal. Speech and behavior are normal.  ____________________________________________   LABS (all labs ordered are listed, but only abnormal results are displayed)  Labs Reviewed  COMPREHENSIVE METABOLIC PANEL - Abnormal; Notable for the following components:      Result Value   Sodium 130 (*)    Potassium 3.0 (*)    Chloride 91 (*)    Glucose, Bld 139 (*)  All other components within normal limits  CBC WITH DIFFERENTIAL/PLATELET - Abnormal; Notable for the following components:   WBC 14.5 (*)    Neutro Abs 12.5 (*)    Abs Immature Granulocytes 0.08 (*)    All other components within normal limits  URINALYSIS, COMPLETE (UACMP) WITH MICROSCOPIC - Abnormal; Notable for the following components:   Color, Urine YELLOW (*)    APPearance CLEAR (*)    Hgb urine dipstick SMALL (*)    Ketones, ur 20 (*)    Protein, ur 100 (*)    Nitrite POSITIVE (*)    Leukocytes,Ua SMALL (*)    Bacteria, UA MANY (*)    All other components within normal limits  TROPONIN I (HIGH SENSITIVITY) - Abnormal; Notable for the following components:   Troponin I (High Sensitivity) 53 (*)    All other components within normal limits  TROPONIN I (HIGH SENSITIVITY) - Abnormal; Notable for the following components:   Troponin I (High Sensitivity) 95 (*)    All other components within normal limits  URINE CULTURE  RESP PANEL BY RT-PCR (FLU A&B, COVID) [id number]   ____________________________________________  12 Lead EKG  Sinus rhythm, rate of 99 bpm.  Incomplete right bundle and otherwise normal intervals.  Normal axis.  No evidence of acute ischemia. ____________________________________________  RADIOLOGY  ED MD interpretation: CT head reviewed by me without evidence of acute intracranial pathology.  Official radiology report(s): CT Head Wo Contrast  Result Date:  12/05/2020 CLINICAL DATA:  Fall EXAM: CT HEAD WITHOUT CONTRAST TECHNIQUE: Contiguous axial images were obtained from the base of the skull through the vertex without intravenous contrast. COMPARISON:  None. FINDINGS: Brain: There is atrophy and chronic small vessel disease changes. No acute intracranial abnormality. Specifically, no hemorrhage, hydrocephalus, mass lesion, acute infarction, or significant intracranial injury. Vascular: No hyperdense vessel or unexpected calcification. Skull: No acute calvarial abnormality. Sinuses/Orbits: Visualized paranasal sinuses and mastoids clear. Orbital soft tissues unremarkable. Other: None IMPRESSION: Atrophy, chronic microvascular disease. No acute intracranial abnormality. Electronically Signed   By: Rolm Baptise M.D.   On: 12/05/2020 11:26   CT Cervical Spine Wo Contrast  Result Date: 12/05/2020 CLINICAL DATA:  Fall in bathtub. EXAM: CT CERVICAL SPINE WITHOUT CONTRAST TECHNIQUE: Multidetector CT imaging of the cervical spine was performed without intravenous contrast. Multiplanar CT image reconstructions were also generated. COMPARISON:  08/18/2020 FINDINGS: Alignment: Normal Skull base and vertebrae: No acute fracture. No primary bone lesion or focal pathologic process. Soft tissues and spinal canal: No prevertebral fluid or swelling. No visible canal hematoma. Disc levels: Early degenerative disc disease and early spurring with disc. Mild-to-moderate bilateral degenerative facet space narrowing disease diffusely. Upper chest: No acute findings Other: None IMPRESSION: Cervical spondylosis.  No acute bony abnormality. Electronically Signed   By: Rolm Baptise M.D.   On: 12/05/2020 11:06   CT T-SPINE NO CHARGE  Result Date: 12/05/2020 CLINICAL DATA:  Fall.  Found down.  Epigastric pain. EXAM: CT THORACIC SPINE WITHOUT CONTRAST TECHNIQUE: Multiplanar reformatted images of the thoracic spine were generated from the data acquired during chest CT of the same date,  dictated separately. COMPARISON:  Chest CT 06/15/2020 FINDINGS: Alignment: Normal. Vertebrae: There is a new mild superior endplate compression deformity at T3 which is likely acute. This results in approximately 10% loss of vertebral body height, and no significant osseous retropulsion. No other fractures are seen. Paraspinal and other soft tissues: No significant paraspinal hematoma. Additional thoracic findings deferred to separate CT of the chest. Disc levels: Degenerative  changes throughout the thoracic spine with endplate osteophytes most advanced at T7-8 and T8-9. No large disc herniation or high-grade foraminal narrowing seen. IMPRESSION: 1. Interval mild superior endplate compression deformity at T3, likely acute. No osseous retropulsion or signs of dynamic instability. 2. No other acute osseous findings. 3. Mild thoracic spondylosis. 4. Additional chest CT findings dictated separately. Electronically Signed   By: Richardean Sale M.D.   On: 12/05/2020 10:43   CT CHEST ABDOMEN PELVIS WO CONTRAST  Result Date: 12/05/2020 CLINICAL DATA:  Unwitnessed fall in bathtub. EXAM: CT CHEST, ABDOMEN AND PELVIS WITHOUT CONTRAST TECHNIQUE: Multidetector CT imaging of the chest, abdomen and pelvis was performed following the standard protocol without IV contrast. COMPARISON:  06/15/2020 FINDINGS: CT CHEST FINDINGS Cardiovascular: Diffuse coronary artery calcifications, mitral valve and aortic valve calcifications and scattered aortic calcifications. Heart is normal size. No aneurysm. Mediastinum/Nodes: No mediastinal, hilar, or axillary adenopathy. Trachea and esophagus are unremarkable. Thyroid unremarkable. No mediastinal hematoma. Lungs/Pleura: Dependent atelectasis in the lungs. No effusions. Posterior right upper lobe nodule measures 8 mm on image 42, new since prior study. No effusions or pneumothorax. Musculoskeletal: Chest wall soft tissues are unremarkable. No acute bony abnormality. CT ABDOMEN PELVIS FINDINGS  Hepatobiliary: Prior cholecystectomy. No focal hepatic abnormality or evidence of a hepatic injury. Pancreas: No focal abnormality or ductal dilatation. Spleen: No splenic injury or perisplenic hematoma. Adrenals/Urinary Tract: No adrenal hemorrhage or renal injury identified. Bladder is unremarkable. No hydronephrosis. Bilateral perinephric stranding is similar to prior study. No evidence of perinephric hematoma or adrenal hemorrhage. Stomach/Bowel: Sigmoid diverticulosis. No active diverticulitis. Stomach and small bowel decompressed, unremarkable. Vascular/Lymphatic: Aortic atherosclerosis. No evidence of aneurysm or adenopathy. Reproductive: Prior hysterectomy.  No adnexal masses. Other: No free fluid or free air. Musculoskeletal: No acute bony abnormality. IMPRESSION: No acute findings or evidence of significant traumatic injury in the chest, abdomen or pelvis. 7 mm posterior right upper lobe pulmonary nodule. Non-contrast chest CT at 6-12 months is recommended. If the nodule is stable at time of repeat CT, then future CT at 18-24 months (from today's scan) is considered optional for low-risk patients, but is recommended for high-risk patients. This recommendation follows the consensus statement: Guidelines for Management of Incidental Pulmonary Nodules Detected on CT Images: From the Fleischner Society 2017; Radiology 2017; 284:228-243. Diffuse coronary artery disease.  Aortic atherosclerosis. Sigmoid diverticulosis.  No active diverticulitis. Electronically Signed   By: Rolm Baptise M.D.   On: 12/05/2020 10:55    ____________________________________________   PROCEDURES and INTERVENTIONS  Procedure(s) performed (including Critical Care):  .1-3 Lead EKG Interpretation Performed by: Vladimir Crofts, MD Authorized by: Vladimir Crofts, MD     Interpretation: normal     ECG rate:  80   ECG rate assessment: normal     Rhythm: sinus rhythm     Ectopy: none     Conduction: normal      Medications   acetaminophen (TYLENOL) tablet 1,000 mg (1,000 mg Oral Given 12/05/20 0946)  cephALEXin (KEFLEX) capsule 500 mg (500 mg Oral Given 12/05/20 1150)    ____________________________________________   MDM / ED COURSE   86 year old woman presents from home after an unwitnessed fall, with a possible syncopal episode, found to have uptrending troponins requiring observation admission.  She has some mild thoracic spinal tenderness, cervical spinal tenderness and abdominal tenderness without peritoneal features.  No signs of neurologic or vascular deficits, no signs of distress.  No significant trauma to the extremities to warrant imaging.  CT imaging demonstrates a minor T3  compression fracture, likely acute, from her fall.  Urinalysis with some infectious features, and was sent for culture patient was started on additional course of Keflex.  Her EKG is nonischemic, but her troponins are uptrending.  We will therefore discussed the case with hospitalist medicine for observation admission.  Clinical Course as of 12/05/20 1308  Wed Dec 05, 2020  1253 Reassessed.  Patient reports feeling well.  Her good friend at bedside.  We discussed CT imaging with evidence of minor thoracic compression fracture.  We discussed urinalysis with nitrites and she reports finishing a course of Keflex 5 days ago.  We discussed urine culture and empiric continued Keflex for a few more days, she is agreeable.  We discussed repeat troponin and likely outpatient management.  She is agreeable and excited to go home. [DS]  1306 Educated patient of uptrending troponin and my recommendation for admission for observation.  She will agreeable. [DS]    Clinical Course User Index [DS] Vladimir Crofts, MD    ____________________________________________   FINAL CLINICAL IMPRESSION(S) / ED DIAGNOSES  Final diagnoses:  Fall  Compression fracture of T3 vertebra, initial encounter ([place])  Elevated troponin     ED Discharge Orders     None       [place]   Note:  This document was prepared using Dragon voice recognition software and may include unintentional dictation errors.   Vladimir Crofts, MD 12/05/20 1310

## 2020-12-05 NOTE — ED Notes (Signed)
Per lab, troponin increased to 95. MD Tamala Julian made aware.

## 2020-12-05 NOTE — Progress Notes (Signed)
*  PRELIMINARY RESULTS* Echocardiogram 2D Echocardiogram has been performed.  Sherrie Sport 12/05/2020, 2:33 PM

## 2020-12-05 NOTE — ED Triage Notes (Signed)
Pt BIB EMS from home, found in bath tub as if she had fell into tub by son who lives with her, pt does not remember fall, last seen by son at 2100 last night. Epigastric pain with bending forward endorsed by patient, denies pain without movement, pt alert and oriented x4, pt hypertensive with EMS.

## 2020-12-05 NOTE — ED Notes (Signed)
Informed RN bed assigned 

## 2020-12-05 NOTE — Consult Note (Signed)
Cardiology Consultation:   Patient ID: [name] MRN: [id number]; DOB: [birth year]  Admit date: 12/05/2020 Date of Consult: 12/05/2020  PCP:  Amy Haven, MD   [facility] Providers Cardiologist:  Kathlyn Sacramento, MD        Patient Profile:   Amy Morton is a 86 y.o. female with a hx of CAD, severe AS who is being seen 12/05/2020 for the evaluation of syncope at the request of Dr. Francine Graven.  History of Present Illness:   Amy Morton is an 86 year old female with history of CAD (PCI to LCx 2016, CTO RCA,  70%LAD), severe aortic stenosis, hypertension, forgetfulness who presents due to syncopal episode.  Patient remembers going to her back from last evening, does not remember much after that.  Apparently, she was found in her bathroom floor by her son who lives with her.  Her son lifted her back to her bed and called EMS.  She denies chest pain, shortness of breath, palpitations.  She has a previous history of falls in her home about 5 months ago, sustaining head laceration.  She states being wobbly on her feet/having an unsteady gait, does not use a cane or walker.  Has a history of severe aortic valve stenosis, evaluated by heart valve team, not a good candidate for SAVR or TAVR.  She states feeling okay, complains of being forgetful.  Family friend at bedside helping with history.  In the ED, EKG showed sinus rhythm, no acute/ischemic changes.  Telemetry showing sinus rhythm.  Troponins were flat, BP 409 systolic.   Past Medical History:  Diagnosis Date  . Abnormal mammogram 2007   Recommend close follow up  . Anemia    Iron, b12, SPEP, UPEP normal, 12/2012  . Anxiety   . Aortic stenosis    Severe by [facility] 05/2020  . aortic stenosis   . Arthritis   . Asthma   . CAD (coronary artery disease)    s/p angioplasty 1996  . Chronic cough   . Depression   . Diverticulosis   . Dyspnea   . Elevated LFTs 2010   s/p ultrasound w/ possible fatty liver; GI consult.  Resolution 2011  . Gastric ulcer   . GERD (gastroesophageal reflux disease)   . History of colonic polyps    Dr. Vira Agar  . HOH (hard of hearing)   . Hyperlipidemia   . Hypertension   . Peritoneal carcinomatosis [facility]) 2001   Dr. Oliva Bustard & Dr. Claiborne Rigg s/p chemo  . Shingles 2008   T-9 distribution  . Valvular heart disease    Mild to Moderate MR, AS  . Vitamin D deficiency   . Wheezing     Past Surgical History:  Procedure Laterality Date  . ABDOMINAL HYSTERECTOMY  2001  . APPENDECTOMY    . CARDIAC CATHETERIZATION  2007   50% mid LAD stenosis, 70% proximal RCA with 100% distal RCA stenosis with collaterals, EF 65%  . CARDIAC CATHETERIZATION N/A 04/27/2015   Procedure: Left Heart Cath and Coronary Angiography;  Surgeon: Jettie Booze, MD;  Location: [place] CV [facility];  Service: Cardiovascular;  Laterality: N/A;  . CARDIAC CATHETERIZATION N/A 04/27/2015   Procedure: Coronary Stent Intervention;  Surgeon: Jettie Booze, MD;  Location: [place] [facility];  Service: Cardiovascular;  Laterality: N/A;  . CATARACT EXTRACTION W/PHACO Left 09/25/2016   Procedure: CATARACT EXTRACTION PHACO AND INTRAOCULAR LENS PLACEMENT (IOC);  Surgeon: Eulogio Bear, MD;  Location: [facility] ORS;  Service: Ophthalmology;  Laterality: Left;  [place] 46.7 AP% 8.1 CDE 3.80 Fluid pack lot # [id number]  . CATARACT EXTRACTION W/PHACO Right 11/06/2016   Procedure: CATARACT EXTRACTION PHACO AND INTRAOCULAR LENS PLACEMENT (IOC);  Surgeon: Eulogio Bear, MD;  Location: [facility] ORS;  Service: Ophthalmology;  Laterality: Right;  [place] 49.5 AP8.7 CDE4.28 LOT # [id number]  . CHOLECYSTECTOMY  1987  . COLECTOMY  2001  . CORONARY ANGIOPLASTY  1996  . CORONARY ANGIOPLASTY    . LAPAROSCOPIC BILATERAL SALPINGO OOPHERECTOMY  1988   Dr. Laverta Baltimore  . LEFT HEART CATH AND CORONARY ANGIOGRAPHY N/A 01/25/2018   Procedure: LEFT HEART CATH AND CORONARY ANGIOGRAPHY;  Surgeon: Wellington Hampshire, MD;  Location: [place] [facility];   Service: Cardiovascular;  Laterality: N/A;  . RIGHT/LEFT HEART CATH AND CORONARY ANGIOGRAPHY N/A 05/16/2020   Procedure: RIGHT/LEFT HEART CATH AND CORONARY ANGIOGRAPHY;  Surgeon: Wellington Hampshire, MD;  Location: [place] [facility];  Service: Cardiovascular;  Laterality: N/A;  . TONSILLECTOMY  1950     Home Medications:  Prior to Admission medications   Medication Sig Start Date End Date Taking? Authorizing Provider  acetaminophen (TYLENOL) 500 MG tablet Take 1,000 mg by mouth in the morning and at bedtime.   Yes [provider]  Ascorbic Acid (VITAMIN C PO) Take by mouth.   Yes [provider]  aspirin 81 MG tablet Take 81 mg by mouth daily.   Yes [provider]  atorvastatin (LIPITOR) 40 MG tablet Take 1 tablet (40 mg total) by mouth daily. 07/18/20 07/13/21 Yes Loel Dubonnet, NP  Cholecalciferol 5000 units TABS Take 5,000 Units by mouth daily.   Yes [provider]  clonazePAM (KLONOPIN) 0.5 MG tablet TAKE 1 TABLET(0.5 MG) BY MOUTH TWICE DAILY AS NEEDED FOR ANXIETY 11/22/20  Yes Amy Haven, MD  clopidogrel (PLAVIX) 75 MG tablet TAKE 1 TABLET BY MOUTH DAILY WITH BREAKFAST 12/03/20  Yes Wellington Hampshire, MD  colestipol (COLESTID) 1 g tablet TAKE 1 TABLET(1 GRAM) BY MOUTH DAILY 11/07/20  Yes Amy Haven, MD  DULoxetine (CYMBALTA) 30 MG capsule Take 1 capsule (30 mg total) by mouth daily for 14 days, THEN 2 capsules (60 mg total) daily. 11/22/20 02/20/21 Yes Amy Haven, MD  isosorbide mononitrate (IMDUR) 30 MG 24 hr tablet TAKE 1 AND 1/2 TABLETS(45 MG) BY MOUTH DAILY 12/03/20  Yes Wellington Hampshire, MD  metoprolol tartrate (LOPRESSOR) 50 MG tablet TAKE 1 TABLET(50 MG) BY MOUTH TWICE DAILY 12/03/20  Yes Wellington Hampshire, MD  Multiple Vitamin (MULTIVITAMIN) capsule Take 1 capsule by mouth daily.   Yes [provider]  Nutritional Supplements (OSTEO ADVANCE) TABS Take 1 tablet by mouth daily.    Yes [provider]  omega-3  acid ethyl esters (LOVAZA) 1 g capsule TAKE 2 CAPSULES BY MOUTH TWICE DAILY 06/11/20  Yes Einar Pheasant, MD  pantoprazole (PROTONIX) 40 MG tablet TAKE 1 TABLET(40 MG) BY MOUTH DAILY 12/03/20  Yes Wellington Hampshire, MD  Ubiquinol 100 MG CAPS Take 100 mg by mouth every morning.   Yes [provider]  vitamin B-12 (CYANOCOBALAMIN) 250 MCG tablet Take 250 mcg by mouth daily.   Yes [provider]  nitroGLYCERIN (NITROSTAT) 0.4 MG SL tablet PLACE 1 TABLET UNDER THE TONGUE EVERY 5 MINUTES AS NEEDED FOR CHEST PAIN, 3 DOSES MAX 11/24/19   Wellington Hampshire, MD    Inpatient Medications: Scheduled Meds: . acetaminophen  1,000 mg Oral BID  . [START ON 12/06/2020] vitamin C  500 mg Oral  Daily  . [START ON 12/06/2020] aspirin EC  81 mg Oral Daily  . atorvastatin  40 mg Oral QHS  . [START ON 12/06/2020] cholecalciferol  5,000 Units Oral Daily  . [START ON 12/06/2020] clopidogrel  75 mg Oral Q breakfast  . colestipol  1 g Oral Daily  . docusate sodium  100 mg Oral BID  . [START ON 12/06/2020] DULoxetine  60 mg Oral Daily  . isosorbide mononitrate  45 mg Oral Daily  . metoprolol tartrate  50 mg Oral BID  . [START ON 12/06/2020] multivitamin with minerals  1 tablet Oral Daily  . omega-3 acid ethyl esters  2 capsule Oral BID  . [START ON 12/06/2020] pantoprazole  40 mg Oral QAC breakfast  . sodium chloride flush  3 mL Intravenous Q12H  . vitamin B-12  250 mcg Oral Daily   Continuous Infusions: . cefTRIAXone (ROCEPHIN)  IV     PRN Meds: clonazePAM, nitroGLYCERIN, ondansetron **OR** ondansetron (ZOFRAN) IV, traMADol  Allergies:    Allergies  Allergen Reactions  . Abilify [Aripiprazole] Other (See Comments)    Burning in Chest Elevated b/p Insomnia  . Celexa [Citalopram]     Caused low sodium level.   . Ibuprofen Other (See Comments)    GIB    Social History:   Social History   Socioeconomic History  . Marital status: Widowed    Spouse name: Not on file  . Number of children:  3  . Years of education: Not on file  . Highest education level: Not on file  Occupational History  . Occupation: [employer]    Comment: Retired  . 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 Smoking status: Never Smoker  . Smokeless tobacco: Never Used  Vaping Use  . Vaping Use: Never used  Substance and Sexual Activity  . Alcohol use: No    Alcohol/week: 0.0 standard drinks  . Drug use: No  . Sexual activity: Never  Other Topics Concern  . Not on file  Social History Narrative   Pt lives in [place] by herself. She is widowed and has a daughter [name]), son [name]). She also had a son that was killed in a work related accident [name] - died age).       Caffeine - none   Exercise - walking   Social Determinants of Health   Financial Resource Strain: Low Risk   . Difficulty of Paying Living Expenses: Not hard at all  Food Insecurity: No Food Insecurity  . Worried About Charity fundraiser in the Last Year: Never true  . Ran Out of Food in the Last Year: Never true  Transportation Needs: No Transportation Needs  . Lack of Transportation (Medical): No  . Lack of Transportation (Non-Medical): No  Physical Activity: Not on file  Stress: No Stress Concern Present  . Feeling of Stress : Not at all  Social Connections: Not on file  Intimate Partner Violence: Not on file    Family History:    Family History  Problem Relation Age of Onset  . Hypertension Mother   . Stroke Mother 65       Cerebral Hemorrhage  . Cancer Father        bone cancer  . Diabetes Sister   . Diabetes Brother   . Heart disease Brother   . Cancer Daughter 8       Ovarian cancer  . Colon cancer Neg Hx   . Colon  polyps Neg Hx      ROS:  Please see the history of present illness.   All other ROS reviewed and negative.     Physical Exam/Data:   Vitals:   12/05/20 1515 12/05/20 1530 12/05/20 1545 12/05/20 1600  BP:  (!) 156/80    Pulse: 69 68 74 72   Resp: 14 19 (!) 9 19  Temp:      TempSrc:      SpO2: 94% 93% 95% 94%  Weight:      Height:       No intake or output data in the 24 hours ending 12/05/20 1632 Last 3 Weights 12/05/2020 11/22/2020 11/16/2020  Weight (lbs) 139 lb 5.3 oz 126 lb 123 lb 8 oz  Weight (kg) 63.2 kg 57.153 kg 56.019 kg     Body mass index is 28.14 kg/m.  General:  Well nourished, well developed, in no acute distress HEENT: normal Lymph: no adenopathy Neck: no JVD Endocrine:  No thryomegaly Vascular: No carotid bruits; FA pulses 2+ bilaterally without bruits  Cardiac:  normal S1, S2; RRR; 3/6 systolic murmur Lungs:  clear to auscultation bilaterally, no wheezing, rhonchi or rales  Abd: soft, nontender, no hepatomegaly  Ext: no edema Musculoskeletal:  No deformities, BUE and BLE strength normal and equal Skin: warm and dry  Neuro:  CNs 2-12 intact, no focal abnormalities noted Psych:  Normal affect   EKG:  The EKG was personally reviewed and demonstrates: Normal sinus rhythm Telemetry:  Telemetry was personally reviewed and demonstrates: Normal sinus rhythm  Relevant CV Studies: Echo 12/05/2020 1. Left ventricular ejection fraction, by estimation, is 60 to 65%. The  left ventricle has normal function. The left ventricle has no regional  wall motion abnormalities. Left ventricular diastolic parameters are  consistent with Grade I diastolic  dysfunction (impaired relaxation).  2. Right ventricular systolic function is normal. The right ventricular  size is normal.  3. Left atrial size was mildly dilated.  4. The mitral valve is normal in structure. Mild mitral valve  regurgitation.  5. Aortc valve is heavily calcified , DVI=0.29, AVA 0.94cm2. stenosis  appears severe visually.. The aortic valve is calcified. Aortic valve  regurgitation is not visualized. Moderate to severe aortic valve stenosis.  6. The inferior vena cava is normal in size with greater than 50%  respiratory variability,  suggesting right atrial pressure of 3 mmHg.  Laboratory Data:  High Sensitivity Troponin:   Recent Labs  Lab 12/05/20 0934 12/05/20 1134 12/05/20 1337  TROPONINIHS 53* 95* 117*     Chemistry Recent Labs  Lab 12/05/20 0934  NA 130*  K 3.0*  CL 91*  CO2 26  GLUCOSE 139*  BUN 9  CREATININE 0.65  CALCIUM 9.2  GFRNONAA >60  ANIONGAP 13    Recent Labs  Lab 12/05/20 0934  PROT 7.8  ALBUMIN 4.3  AST 35  ALT 21  ALKPHOS 44  BILITOT 1.0   Hematology Recent Labs  Lab 12/05/20 0934  WBC 14.5*  RBC 4.19  HGB 13.3  HCT 37.2  MCV 88.8  MCH 31.7  MCHC 35.8  RDW 11.6  PLT 192   BNPNo results for input(s): BNP, PROBNP in the last 168 hours.  DDimer No results for input(s): DDIMER in the last 168 hours.   Radiology/Studies:  CT Head Wo Contrast  Result Date: 12/05/2020 CLINICAL DATA:  Fall EXAM: CT HEAD WITHOUT CONTRAST TECHNIQUE: Contiguous axial images were obtained from the base of the skull through the vertex without  intravenous contrast. COMPARISON:  None. FINDINGS: Brain: There is atrophy and chronic small vessel disease changes. No acute intracranial abnormality. Specifically, no hemorrhage, hydrocephalus, mass lesion, acute infarction, or significant intracranial injury. Vascular: No hyperdense vessel or unexpected calcification. Skull: No acute calvarial abnormality. Sinuses/Orbits: Visualized paranasal sinuses and mastoids clear. Orbital soft tissues unremarkable. Other: None IMPRESSION: Atrophy, chronic microvascular disease. No acute intracranial abnormality. Electronically Signed   By: Rolm Baptise M.D.   On: 12/05/2020 11:26   CT Cervical Spine Wo Contrast  Result Date: 12/05/2020 CLINICAL DATA:  Fall in bathtub. EXAM: CT CERVICAL SPINE WITHOUT CONTRAST TECHNIQUE: Multidetector CT imaging of the cervical spine was performed without intravenous contrast. Multiplanar CT image reconstructions were also generated. COMPARISON:  08/18/2020 FINDINGS: Alignment:  Normal Skull base and vertebrae: No acute fracture. No primary bone lesion or focal pathologic process. Soft tissues and spinal canal: No prevertebral fluid or swelling. No visible canal hematoma. Disc levels: Early degenerative disc disease and early spurring with disc. Mild-to-moderate bilateral degenerative facet space narrowing disease diffusely. Upper chest: No acute findings Other: None IMPRESSION: Cervical spondylosis.  No acute bony abnormality. Electronically Signed   By: Rolm Baptise M.D.   On: 12/05/2020 11:06   CT T-SPINE NO CHARGE  Result Date: 12/05/2020 CLINICAL DATA:  Fall.  Found down.  Epigastric pain. EXAM: CT THORACIC SPINE WITHOUT CONTRAST TECHNIQUE: Multiplanar reformatted images of the thoracic spine were generated from the data acquired during chest CT of the same date, dictated separately. COMPARISON:  Chest CT 06/15/2020 FINDINGS: Alignment: Normal. Vertebrae: There is a new mild superior endplate compression deformity at T3 which is likely acute. This results in approximately 10% loss of vertebral body height, and no significant osseous retropulsion. No other fractures are seen. Paraspinal and other soft tissues: No significant paraspinal hematoma. Additional thoracic findings deferred to separate CT of the chest. Disc levels: Degenerative changes throughout the thoracic spine with endplate osteophytes most advanced at T7-8 and T8-9. No large disc herniation or high-grade foraminal narrowing seen. IMPRESSION: 1. Interval mild superior endplate compression deformity at T3, likely acute. No osseous retropulsion or signs of dynamic instability. 2. No other acute osseous findings. 3. Mild thoracic spondylosis. 4. Additional chest CT findings dictated separately. Electronically Signed   By: Richardean Sale M.D.   On: 12/05/2020 10:43   ECHOCARDIOGRAM COMPLETE  Result Date: 12/05/2020    ECHOCARDIOGRAM REPORT   Patient Name:   Amy Morton Date of Exam: 12/05/2020 Medical Rec #:   [id number]          Height:       59.0 in Accession #:    [id number]         Weight:       139.3 lb Date of Birth:  Jan 27, 1935          BSA:          1.582 m Patient Age:    3 years           BP:           195/92 mmHg Patient Gender: F                  HR:           102 bpm. Exam Location:  [facility] Procedure: 2D Echo, Cardiac Doppler and Color Doppler  MODIFIED REPORT: This report was modified by Kate Sable MD on 12/05/2020 due to ivc normal,                                 aortic stenosis.  Indications:     Syncope R55  History:         Patient has prior history of Echocardiogram examinations, most                  recent 03/06/2020. Risk Factors:Hypertension. Aortic stenosis.  Sonographer:     Sherrie Sport RDCS (AE) Referring Phys:  Collier Bullock Diagnosing Phys: Kate Sable MD  Sonographer Comments: Suboptimal apical window and no parasternal window. IMPRESSIONS  1. Left ventricular ejection fraction, by estimation, is 60 to 65%. The left ventricle has normal function. The left ventricle has no regional wall motion abnormalities. Left ventricular diastolic parameters are consistent with Grade I diastolic dysfunction (impaired relaxation).  2. Right ventricular systolic function is normal. The right ventricular size is normal.  3. Left atrial size was mildly dilated.  4. The mitral valve is normal in structure. Mild mitral valve regurgitation.  5. Aortc valve is heavily calcified , DVI=0.29, AVA 0.94cm2. stenosis appears severe visually.. The aortic valve is calcified. Aortic valve regurgitation is not visualized. Moderate to severe aortic valve stenosis.  6. The inferior vena cava is normal in size with greater than 50% respiratory variability, suggesting right atrial pressure of 3 mmHg. FINDINGS  Left Ventricle: Left ventricular ejection fraction, by estimation, is 60 to 65%. The left ventricle has normal function. The left ventricle has no regional wall motion  abnormalities. The left ventricular internal cavity size was normal in size. There is  no left ventricular hypertrophy. Left ventricular diastolic parameters are consistent with Grade I diastolic dysfunction (impaired relaxation). Right Ventricle: The right ventricular size is normal. No increase in right ventricular wall thickness. Right ventricular systolic function is normal. Left Atrium: Left atrial size was mildly dilated. Right Atrium: Right atrial size was normal in size. Pericardium: There is no evidence of pericardial effusion. Mitral Valve: The mitral valve is normal in structure. Mild mitral valve regurgitation. Tricuspid Valve: The tricuspid valve is grossly normal. Tricuspid valve regurgitation is not demonstrated. Aortic Valve: Aortc valve is heavily calcified , DVI=0.29, AVA 0.94cm2. stenosis appears severe visually. The aortic valve is calcified. Aortic valve regurgitation is not visualized. Moderate to severe aortic stenosis is present. Aortic valve mean gradient measures 16.8 mmHg. Aortic valve peak gradient measures 27.2 mmHg. Aortic valve area, by VTI measures 1.00 cm. Pulmonic Valve: The pulmonic valve was not well visualized. Pulmonic valve regurgitation is not visualized. Aorta: The aortic root was not well visualized. Venous: The inferior vena cava is normal in size with greater than 50% respiratory variability, suggesting right atrial pressure of 3 mmHg. IAS/Shunts: No atrial level shunt detected by color flow Doppler.  LEFT VENTRICLE PLAX 2D LVIDd:         3.09 cm  Diastology LVIDs:         2.17 cm  LV e' medial:    5.77 cm/s LV PW:         1.03 cm  LV E/e' medial:  11.2 LV IVS:        0.82 cm  LV e' lateral:   5.44 cm/s LVOT diam:     2.00 cm  LV E/e' lateral: 11.9 LV SV:  45 LV SV Index:   29 LVOT Area:     3.14 cm  RIGHT VENTRICLE RV Basal diam:  2.72 cm RV S prime:     14.30 cm/s TAPSE (M-mode): 2.8 cm LEFT ATRIUM             Index       RIGHT ATRIUM           Index LA diam:         3.10 cm 1.96 cm/m  RA Area:     10.20 cm LA Vol (A2C):   71.5 ml 45.20 ml/m RA Volume:   17.60 ml  11.13 ml/m LA Vol (A4C):   59.0 ml 37.30 ml/m LA Biplane Vol: 65.3 ml 41.28 ml/m  AORTIC VALVE                    PULMONIC VALVE AV Area (Vmax):    1.09 cm     PV Vmax:        0.92 m/s AV Area (Vmean):   1.14 cm     PV Peak grad:   3.4 mmHg AV Area (VTI):     1.00 cm     RVOT Peak grad: 4 mmHg AV Vmax:           260.75 cm/s AV Vmean:          189.500 cm/s AV VTI:            0.451 m AV Peak Grad:      27.2 mmHg AV Mean Grad:      16.8 mmHg LVOT Vmax:         90.70 cm/s LVOT Vmean:        68.800 cm/s LVOT VTI:          0.144 m LVOT/AV VTI ratio: 0.32 MITRAL VALVE                TRICUSPID VALVE MV Area (PHT): 5.13 cm     TR Peak grad:   21.7 mmHg MV Decel Time: 148 msec     TR Vmax:        233.00 cm/s MV E velocity: 64.70 cm/s MV A velocity: 122.00 cm/s  SHUNTS MV E/A ratio:  0.53         Systemic VTI:  0.14 m                             Systemic Diam: 2.00 cm Kate Sable MD Electronically signed by Kate Sable MD Signature Date/Time: 12/05/2020/3:12:59 PM    Final (Updated)    CT CHEST ABDOMEN PELVIS WO CONTRAST  Result Date: 12/05/2020 CLINICAL DATA:  Unwitnessed fall in bathtub. EXAM: CT CHEST, ABDOMEN AND PELVIS WITHOUT CONTRAST TECHNIQUE: Multidetector CT imaging of the chest, abdomen and pelvis was performed following the standard protocol without IV contrast. COMPARISON:  06/15/2020 FINDINGS: CT CHEST FINDINGS Cardiovascular: Diffuse coronary artery calcifications, mitral valve and aortic valve calcifications and scattered aortic calcifications. Heart is normal size. No aneurysm. Mediastinum/Nodes: No mediastinal, hilar, or axillary adenopathy. Trachea and esophagus are unremarkable. Thyroid unremarkable. No mediastinal hematoma. Lungs/Pleura: Dependent atelectasis in the lungs. No effusions. Posterior right upper lobe nodule measures 8 mm on image 42, new since prior study. No  effusions or pneumothorax. Musculoskeletal: Chest wall soft tissues are unremarkable. No acute bony abnormality. CT ABDOMEN PELVIS FINDINGS Hepatobiliary: Prior cholecystectomy. No focal hepatic abnormality or evidence of a hepatic injury. Pancreas: No focal abnormality  or ductal dilatation. Spleen: No splenic injury or perisplenic hematoma. Adrenals/Urinary Tract: No adrenal hemorrhage or renal injury identified. Bladder is unremarkable. No hydronephrosis. Bilateral perinephric stranding is similar to prior study. No evidence of perinephric hematoma or adrenal hemorrhage. Stomach/Bowel: Sigmoid diverticulosis. No active diverticulitis. Stomach and small bowel decompressed, unremarkable. Vascular/Lymphatic: Aortic atherosclerosis. No evidence of aneurysm or adenopathy. Reproductive: Prior hysterectomy.  No adnexal masses. Other: No free fluid or free air. Musculoskeletal: No acute bony abnormality. IMPRESSION: No acute findings or evidence of significant traumatic injury in the chest, abdomen or pelvis. 7 mm posterior right upper lobe pulmonary nodule. Non-contrast chest CT at 6-12 months is recommended. If the nodule is stable at time of repeat CT, then future CT at 18-24 months (from today's scan) is considered optional for low-risk patients, but is recommended for high-risk patients. This recommendation follows the consensus statement: Guidelines for Management of Incidental Pulmonary Nodules Detected on CT Images: From the Fleischner Society 2017; Radiology 2017; 284:228-243. Diffuse coronary artery disease.  Aortic atherosclerosis. Sigmoid diverticulosis.  No active diverticulitis. Electronically Signed   By: Rolm Baptise M.D.   On: 12/05/2020 10:55     Assessment and Plan:   1. Syncope -Etiology unclear. (Differential includes: Orthostasis, mechanical/unsteady gait, severe AS, arrhythmia) -Obtain orthostatic blood pressures. -Monitor on telemetry for any significant arrhythmias.  Consider cardiac  monitor upon discharge. -Ambulate with physical therapy, patient likely needs ambulatory device (cane, walker etc) -Not a candidate for TAVR or SAVR. -Allow BP's to run a little high >150, <180. -Echo with preserved EF, moderate to severe AS, not significantly changed from prior.  2.  Severe AS -BP parameters as above -Noted TAVR or SAVR candidate  3.  Hypertension -Continue PTA meds  4.  CAD/PCI -Aspirin, Plavix, statin.  Total encounter time 110 minutes  Greater than 50% was spent in counseling and coordination of care with the patient  Signed, Kate Sable, MD  12/05/2020 4:32 PM

## 2020-12-05 NOTE — H&P (Addendum)
History and Physical    ARDENA GANGL TKP:[id number] DOB: 12/24/1934 DOA: 12/05/2020  PCP: [name], MD   Patient coming from: Home  I have personally briefly reviewed patient's old medical records in [place]  Chief Complaint: " I think I passed out"  HPI: Amy Morton is a 86 y.o. female with medical history significant for aortic stenosis, coronary artery disease, anxiety disorder, depression and hypertension who was brought into the ER by EMS after she was found laying in the bathroom by her son who lives with her. Patient states that she had gone to use the bathroom and does not remember what happened after that thinks she may have passed out.  She complains of epigastric pain mostly when she leans forward but denies having any nausea, no vomiting, no hematemesis, no melena stools, no diaphoresis or palpitations. She denied feeling dizzy or lightheaded prior to going to the bathroom and denied having any chest pain or shortness of breath.  She denies having any changes in her bowel habits, no fever, no chills, no headache, no cough, no blurred vision. Labs show sodium 130, potassium 3.0, chloride 91, calcium 9.2, anion gap 13, alkaline phosphatase 44, albumin 4.3, AST 35 ALT 21, total protein 7.8, troponin 53 >> 95, hemoglobin 13.3, hematocrit 37.2, MCV 88.8, RDW 11.6, platelet count 192 CT scan of the head without contrast shows atrophy, chronic microvascular disease.No acute intracranial abnormality. Cervical spine CT shows cervical spondylosis.  No acute bony abnormality. CT scan of chest, abdomen and pelvis shows no acute findings or evidence of significant traumatic injury in the chest, abdomen or pelvis. 7 mm posterior right upper lobe pulmonary nodule. Non-contrast chest CT at 6-12 months is recommended. If the nodule is stable at time of repeat CT, then future CT at 18-24 months (from today's scan) is considered optional for low-risk patients, but is  recommended for high-risk patients. CT scan of thoracic spine shows interval mild superior endplate compression deformity at T3, likely acute. No osseous retropulsion or signs of dynamic instability. No other acute osseous findings. Mild thoracic spondylosis. Twelve-lead EKG reviewed by me shows sinus rhythm with a left bundle branch block     ED Course: Patient is an 86 year old Caucasian female who was brought into the ER for evaluation after she was found in the bathtub by her son.  Patient thinks she may have passed out and does not remember how she ended up on the floor of the bathtub.  Labs reveal hypokalemia, hyponatremia, leukocytosis and pyuria.  She also has a mild compression fracture of T3 but is asymptomatic at this time.  She will be referred to observation status for further evaluation.  Review of Systems: As per HPI otherwise all other systems reviewed and negative.    Past Medical History:  Diagnosis Date  . Abnormal mammogram 2007   Recommend close follow up  . Anemia    Iron, b12, SPEP, UPEP normal, 12/2012  . Anxiety   . Aortic stenosis    Severe by [facility] 05/2020  . aortic stenosis   . Arthritis   . Asthma   . CAD (coronary artery disease)    s/p angioplasty 1996  . Chronic cough   . Depression   . Diverticulosis   . Dyspnea   . Elevated LFTs 2010   s/p ultrasound w/ possible fatty liver; GI consult. Resolution 2011  . Gastric ulcer   . GERD (gastroesophageal reflux disease)   . History of colonic polyps  Dr. Vira Agar  . HOH (hard of hearing)   . Hyperlipidemia   . Hypertension   . Peritoneal carcinomatosis [facility]) 2001   Dr. Oliva Bustard & Dr. Claiborne Rigg s/p chemo  . Shingles 2008   T-9 distribution  . Valvular heart disease    Mild to Moderate MR, AS  . Vitamin D deficiency   . Wheezing     Past Surgical History:  Procedure Laterality Date  . ABDOMINAL HYSTERECTOMY  2001  . APPENDECTOMY    . CARDIAC CATHETERIZATION  2007   50% mid LAD stenosis,  70% proximal RCA with 100% distal RCA stenosis with collaterals, EF 65%  . CARDIAC CATHETERIZATION N/A 04/27/2015   Procedure: Left Heart Cath and Coronary Angiography;  Surgeon: Jettie Booze, MD;  Location: [place] [facility];  Service: Cardiovascular;  Laterality: N/A;  . CARDIAC CATHETERIZATION N/A 04/27/2015   Procedure: Coronary Stent Intervention;  Surgeon: Jettie Booze, MD;  Location: [place] [facility];  Service: Cardiovascular;  Laterality: N/A;  . CATARACT EXTRACTION W/PHACO Left 09/25/2016   Procedure: CATARACT EXTRACTION PHACO AND INTRAOCULAR LENS PLACEMENT (IOC);  Surgeon: Eulogio Bear, MD;  Location: [facility] ORS;  Service: Ophthalmology;  Laterality: Left;  [place] 46.7 AP% 8.1 CDE 3.80 Fluid pack lot # [id number]  . CATARACT EXTRACTION W/PHACO Right 11/06/2016   Procedure: CATARACT EXTRACTION PHACO AND INTRAOCULAR LENS PLACEMENT ([place]);  Surgeon: Eulogio Bear, MD;  Location: [facility] ORS;  Service: Ophthalmology;  Laterality: Right;  [place] 49.5 AP8.7 CDE4.28 LOT # [id number]  . CHOLECYSTECTOMY  1987  . COLECTOMY  2001  . CORONARY ANGIOPLASTY  1996  . CORONARY ANGIOPLASTY    . LAPAROSCOPIC BILATERAL SALPINGO OOPHERECTOMY  1988   Dr. Laverta Baltimore  . LEFT HEART CATH AND CORONARY ANGIOGRAPHY N/A 01/25/2018   Procedure: LEFT HEART CATH AND CORONARY ANGIOGRAPHY;  Surgeon: Wellington Hampshire, MD;  Location: [place] [facility];  Service: Cardiovascular;  Laterality: N/A;  . RIGHT/LEFT HEART CATH AND CORONARY ANGIOGRAPHY N/A 05/16/2020   Procedure: RIGHT/LEFT HEART CATH AND CORONARY ANGIOGRAPHY;  Surgeon: Wellington Hampshire, MD;  Location: [place] [facility];  Service: Cardiovascular;  Laterality: N/A;  . TONSILLECTOMY  1950     reports that she has never smoked. She has never used smokeless tobacco. She reports that she does not drink alcohol and does not use drugs.  Allergies  Allergen Reactions  . Abilify [Aripiprazole] Other (See Comments)    Burning in Chest Elevated b/p Insomnia   . Celexa [Citalopram]     Caused low sodium level.   . Ibuprofen Other (See Comments)    GIB    Family History  Problem Relation Age of Onset  . Hypertension Mother   . Stroke Mother 7       Cerebral Hemorrhage  . Cancer Father        bone cancer  . Diabetes Sister   . Diabetes Brother   . Heart disease Brother   . Cancer Daughter 47       Ovarian cancer  . Colon cancer Neg Hx   . Colon polyps Neg Hx       Prior to Admission medications   Medication Sig Start Date End Date Taking? Authorizing Provider  acetaminophen (TYLENOL) 500 MG tablet Take 1,000 mg by mouth in the morning and at bedtime.    [provider]  Ascorbic Acid (VITAMIN C PO) Take by mouth.    [provider]  aspirin 81 MG tablet Take 81 mg by  mouth daily.    [provider]  atorvastatin (LIPITOR) 40 MG tablet Take 1 tablet (40 mg total) by mouth daily. 07/18/20 07/13/21  Loel Dubonnet, NP  Cholecalciferol 5000 units TABS Take 5,000 Units by mouth daily.    [provider]  clonazePAM (KLONOPIN) 0.5 MG tablet TAKE 1 TABLET(0.5 MG) BY MOUTH TWICE DAILY AS NEEDED FOR ANXIETY 11/22/20   [name], MD  clopidogrel (PLAVIX) 75 MG tablet TAKE 1 TABLET BY MOUTH DAILY WITH BREAKFAST 12/03/20   Wellington Hampshire, MD  colestipol (COLESTID) 1 g tablet TAKE 1 TABLET(1 GRAM) BY MOUTH DAILY 11/07/20   [name], MD  DULoxetine (CYMBALTA) 30 MG capsule Take 1 capsule (30 mg total) by mouth daily for 14 days, THEN 2 capsules (60 mg total) daily. 11/22/20 02/20/21  [name], MD  isosorbide mononitrate (IMDUR) 30 MG 24 hr tablet TAKE 1 AND 1/2 TABLETS(45 MG) BY MOUTH DAILY 12/03/20   Wellington Hampshire, MD  metoprolol tartrate (LOPRESSOR) 50 MG tablet TAKE 1 TABLET(50 MG) BY MOUTH TWICE DAILY 12/03/20   Wellington Hampshire, MD  Multiple Vitamin (MULTIVITAMIN) capsule Take 1 capsule by mouth daily.    [provider]  nitroGLYCERIN (NITROSTAT) 0.4 MG SL tablet PLACE  1 TABLET UNDER THE TONGUE EVERY 5 MINUTES AS NEEDED FOR CHEST PAIN, 3 DOSES MAX 11/24/19   Wellington Hampshire, MD  Nutritional Supplements (OSTEO ADVANCE) TABS Take 1 tablet by mouth daily.     [provider]  omega-3 acid ethyl esters (LOVAZA) 1 g capsule TAKE 2 CAPSULES BY MOUTH TWICE DAILY 06/11/20   Einar Pheasant, MD  pantoprazole (PROTONIX) 40 MG tablet TAKE 1 TABLET(40 MG) BY MOUTH DAILY 12/03/20   Wellington Hampshire, MD  Ubiquinol 100 MG CAPS Take 100 mg by mouth every morning.    [provider]  vitamin B-12 (CYANOCOBALAMIN) 250 MCG tablet Take 250 mcg by mouth daily.    [provider]    Physical Exam: Vitals:   12/05/20 1315 12/05/20 1330 12/05/20 1345 12/05/20 1400  BP:  (!) 195/92    Pulse: (!) 105 (!) 103  (!) 102  Resp: 17 17 (!) 27 (!) 26  Temp:      TempSrc:      SpO2: 94% 94% 96% 94%  Weight:      Height:         Vitals:   12/05/20 1315 12/05/20 1330 12/05/20 1345 12/05/20 1400  BP:  (!) 195/92    Pulse: (!) 105 (!) 103  (!) 102  Resp: 17 17 (!) 27 (!) 26  Temp:      TempSrc:      SpO2: 94% 94% 96% 94%  Weight:      Height:          Constitutional: Alert  and oriented x 3 . Not in any apparent distress HEENT:      Head: Normocephalic and atraumatic.         Eyes: PERLA, EOMI, Conjunctivae are normal. Sclera is non-icteric.       Mouth/Throat: Mucous membranes are moist.       Neck: Supple with no signs of meningismus. Cardiovascular:  Tachycardic.  Systolic ejection murmur, gallops, or rubs. 2+ symmetrical distal pulses are present . No JVD. No LE edema Respiratory: Respiratory effort normal .Lungs sounds clear bilaterally. No wheezes, crackles, or rhonchi.  Gastrointestinal: Soft, epigastric tender, and non distended with positive bowel sounds.  Genitourinary: No CVA tenderness. Musculoskeletal:  Nontender with normal range of motion in all extremities. No cyanosis, or erythema of extremities. Neurologic:  Face is symmetric.  Moving all extremities. No gross focal neurologic deficits  Skin: Skin is warm, dry.  No rash or ulcers Psychiatric: Mood and affect are normal   Labs on Admission: I have personally reviewed following labs and imaging studies  CBC: Recent Labs  Lab 12/05/20 0934  WBC 14.5*  NEUTROABS 12.5*  HGB 13.3  HCT 37.2  MCV 88.8  PLT [id number]   Basic Metabolic Panel: Recent Labs  Lab 12/05/20 0934  NA 130*  K 3.0*  CL 91*  CO2 26  GLUCOSE 139*  BUN 9  CREATININE 0.65  CALCIUM 9.2   GFR: Estimated Creatinine Clearance: 43.1 mL/min (by C-G formula based on SCr of 0.65 mg/dL). Liver Function Tests: Recent Labs  Lab 12/05/20 0934  AST 35  ALT 21  ALKPHOS 44  BILITOT 1.0  PROT 7.8  ALBUMIN 4.3   No results for input(s): LIPASE, AMYLASE in the last 168 hours. No results for input(s): AMMONIA in the last 168 hours. Coagulation Profile: No results for input(s): INR, PROTIME in the last 168 hours. Cardiac Enzymes: No results for input(s): CKTOTAL, CKMB, CKMBINDEX, TROPONINI in the last 168 hours. BNP (last 3 results) No results for input(s): PROBNP in the last 8760 hours. HbA1C: No results for input(s): HGBA1C in the last 72 hours. CBG: No results for input(s): GLUCAP in the last 168 hours. Lipid Profile: No results for input(s): CHOL, HDL, LDLCALC, TRIG, CHOLHDL, LDLDIRECT in the last 72 hours. Thyroid Function Tests: No results for input(s): TSH, T4TOTAL, FREET4, T3FREE, THYROIDAB in the last 72 hours. Anemia Panel: No results for input(s): VITAMINB12, FOLATE, FERRITIN, TIBC, IRON, RETICCTPCT in the last 72 hours. Urine analysis:    Component Value Date/Time   COLORURINE YELLOW (A) 12/05/2020 1038   APPEARANCEUR CLEAR (A) 12/05/2020 1038   APPEARANCEUR Hazy 11/17/2014 2031   LABSPEC 1.009 12/05/2020 1038   LABSPEC 1.014 11/17/2014 2031   PHURINE 6.0 12/05/2020 1038   GLUCOSEU NEGATIVE 12/05/2020 1038   GLUCOSEU Negative 11/17/2014 2031   HGBUR SMALL (A) 12/05/2020  1038   BILIRUBINUR NEGATIVE 12/05/2020 1038   BILIRUBINUR negative 11/22/2020 1232   BILIRUBINUR Negative 11/17/2014 2031   KETONESUR 20 (A) 12/05/2020 1038   PROTEINUR 100 (A) 12/05/2020 1038   UROBILINOGEN 0.2 11/22/2020 1232   NITRITE POSITIVE (A) 12/05/2020 1038   LEUKOCYTESUR SMALL (A) 12/05/2020 1038   LEUKOCYTESUR 1+ 11/17/2014 2031    Radiological Exams on Admission: CT Head Wo Contrast  Result Date: 12/05/2020 CLINICAL DATA:  Fall EXAM: CT HEAD WITHOUT CONTRAST TECHNIQUE: Contiguous axial images were obtained from the base of the skull through the vertex without intravenous contrast. COMPARISON:  None. FINDINGS: Brain: There is atrophy and chronic small vessel disease changes. No acute intracranial abnormality. Specifically, no hemorrhage, hydrocephalus, mass lesion, acute infarction, or significant intracranial injury. Vascular: No hyperdense vessel or unexpected calcification. Skull: No acute calvarial abnormality. Sinuses/Orbits: Visualized paranasal sinuses and mastoids clear. Orbital soft tissues unremarkable. Other: None IMPRESSION: Atrophy, chronic microvascular disease. No acute intracranial abnormality. Electronically Signed   By: Rolm Baptise M.D.   On: 12/05/2020 11:26   CT Cervical Spine Wo Contrast  Result Date: 12/05/2020 CLINICAL DATA:  Fall in bathtub. EXAM: CT CERVICAL SPINE WITHOUT CONTRAST TECHNIQUE: Multidetector CT imaging of the cervical spine was performed without intravenous contrast. Multiplanar CT image reconstructions were also generated. COMPARISON:  08/18/2020 FINDINGS: Alignment: Normal Skull base and vertebrae:  No acute fracture. No primary bone lesion or focal pathologic process. Soft tissues and spinal canal: No prevertebral fluid or swelling. No visible canal hematoma. Disc levels: Early degenerative disc disease and early spurring with disc. Mild-to-moderate bilateral degenerative facet space narrowing disease diffusely. Upper chest: No acute findings  Other: None IMPRESSION: Cervical spondylosis.  No acute bony abnormality. Electronically Signed   By: Rolm Baptise M.D.   On: 12/05/2020 11:06   CT T-SPINE NO CHARGE  Result Date: 12/05/2020 CLINICAL DATA:  Fall.  Found down.  Epigastric pain. EXAM: CT THORACIC SPINE WITHOUT CONTRAST TECHNIQUE: Multiplanar reformatted images of the thoracic spine were generated from the data acquired during chest CT of the same date, dictated separately. COMPARISON:  Chest CT 06/15/2020 FINDINGS: Alignment: Normal. Vertebrae: There is a new mild superior endplate compression deformity at T3 which is likely acute. This results in approximately 10% loss of vertebral body height, and no significant osseous retropulsion. No other fractures are seen. Paraspinal and other soft tissues: No significant paraspinal hematoma. Additional thoracic findings deferred to separate CT of the chest. Disc levels: Degenerative changes throughout the thoracic spine with endplate osteophytes most advanced at T7-8 and T8-9. No large disc herniation or high-grade foraminal narrowing seen. IMPRESSION: 1. Interval mild superior endplate compression deformity at T3, likely acute. No osseous retropulsion or signs of dynamic instability. 2. No other acute osseous findings. 3. Mild thoracic spondylosis. 4. Additional chest CT findings dictated separately. Electronically Signed   By: Richardean Sale M.D.   On: 12/05/2020 10:43   CT CHEST ABDOMEN PELVIS WO CONTRAST  Result Date: 12/05/2020 CLINICAL DATA:  Unwitnessed fall in bathtub. EXAM: CT CHEST, ABDOMEN AND PELVIS WITHOUT CONTRAST TECHNIQUE: Multidetector CT imaging of the chest, abdomen and pelvis was performed following the standard protocol without IV contrast. COMPARISON:  06/15/2020 FINDINGS: CT CHEST FINDINGS Cardiovascular: Diffuse coronary artery calcifications, mitral valve and aortic valve calcifications and scattered aortic calcifications. Heart is normal size. No aneurysm. Mediastinum/Nodes:  No mediastinal, hilar, or axillary adenopathy. Trachea and esophagus are unremarkable. Thyroid unremarkable. No mediastinal hematoma. Lungs/Pleura: Dependent atelectasis in the lungs. No effusions. Posterior right upper lobe nodule measures 8 mm on image 42, new since prior study. No effusions or pneumothorax. Musculoskeletal: Chest wall soft tissues are unremarkable. No acute bony abnormality. CT ABDOMEN PELVIS FINDINGS Hepatobiliary: Prior cholecystectomy. No focal hepatic abnormality or evidence of a hepatic injury. Pancreas: No focal abnormality or ductal dilatation. Spleen: No splenic injury or perisplenic hematoma. Adrenals/Urinary Tract: No adrenal hemorrhage or renal injury identified. Bladder is unremarkable. No hydronephrosis. Bilateral perinephric stranding is similar to prior study. No evidence of perinephric hematoma or adrenal hemorrhage. Stomach/Bowel: Sigmoid diverticulosis. No active diverticulitis. Stomach and small bowel decompressed, unremarkable. Vascular/Lymphatic: Aortic atherosclerosis. No evidence of aneurysm or adenopathy. Reproductive: Prior hysterectomy.  No adnexal masses. Other: No free fluid or free air. Musculoskeletal: No acute bony abnormality. IMPRESSION: No acute findings or evidence of significant traumatic injury in the chest, abdomen or pelvis. 7 mm posterior right upper lobe pulmonary nodule. Non-contrast chest CT at 6-12 months is recommended. If the nodule is stable at time of repeat CT, then future CT at 18-24 months (from today's scan) is considered optional for low-risk patients, but is recommended for high-risk patients. This recommendation follows the consensus statement: Guidelines for Management of Incidental Pulmonary Nodules Detected on CT Images: From the Fleischner Society 2017; Radiology 2017; 284:228-243. Diffuse coronary artery disease.  Aortic atherosclerosis. Sigmoid diverticulosis.  No active diverticulitis. Electronically Signed   By:  Rolm Baptise M.D.    On: 12/05/2020 10:55     Assessment/Plan Principal Problem:   Syncope and collapse Active Problems:   Anxiety and depression   Aortic valve stenosis   CAD (coronary artery disease)   Hyponatremia   Essential hypertension   Acute lower UTI   Hypokalemia   Elevated troponin   Compression fracture of T3 vertebra (HCC)     Syncope and collapse Unclear etiology may be related to history of aortic stenosis We will repeat 2D echocardiogram to assess LVEF and assess aortic valve area Place patient on cardiac monitor to rule out arrhythmias We will request cardiology consult    Hypertension Continue nitrates and metoprolol    Elevated troponin/CAD Unclear etiology and patient denies having any chest pain but complained of epigastric pain Troponin shows an upward trend and patient has a history of coronary artery disease Continue aspirin, Plavix, statins and beta-blockers Obtain 2D echocardiogram to rule out regional wall motion abnormalities We will also obtain total CK levels.    Hyponatremia Most likely related to Cymbalta use Monitor sodium levels closely    Hypokalemia Supplement potassium Check magnesium levels    UTI Recent urine culture from 11/22/20 yields E. Coli Patient continues to have pyuria and leukocytosis We will treat patient with Rocephin 1 g IV daily for 3 days    Anxiety and depression Continue Cymbalta and clonazepam    Compression fracture of T3 Acute and following a fall Discussed with neurosurgery and patient does not need a brace at this time or any intervention Pain control as needed   DVT prophylaxis: SCD Code Status: full code Family Communication: Greater than 50% of time was spent discussing patient's condition and plan of care with her at the bedside.  All questions and concerns have been addressed.  CODE STATUS was discussed and she wishes to be full code. Disposition Plan: Back to previous home environment Consults  called: Cardiology Status: Observation    Leira Regino MD [facility]     12/05/2020, 2:27 PM

## 2020-12-05 NOTE — ED Notes (Signed)
Pt placed on bed alarm that is on and audible, falls band on, bed in lowest and locked position, call bell within reach.

## 2020-12-05 NOTE — Plan of Care (Signed)
Lab critical value called - Tropin 143. Dr. informed

## 2020-12-06 ENCOUNTER — Observation Stay (HOSPITAL_BASED_OUTPATIENT_CLINIC_OR_DEPARTMENT_OTHER)
Admit: 2020-12-06 | Discharge: 2020-12-06 | Disposition: A | Discharge status: Home / Self Care | Payer: Medicare Other | Attending: Physician Assistant | Admitting: Physician Assistant

## 2020-12-06 ENCOUNTER — Other Ambulatory Visit: Payer: Self-pay | Admitting: Physician Assistant

## 2020-12-06 DIAGNOSIS — R55 Syncope and collapse: Secondary | ICD-10-CM | POA: Diagnosis not present

## 2020-12-06 DIAGNOSIS — I25118 Atherosclerotic heart disease of native coronary artery with other forms of angina pectoris: Secondary | ICD-10-CM | POA: Diagnosis not present

## 2020-12-06 DIAGNOSIS — I10 Essential (primary) hypertension: Secondary | ICD-10-CM | POA: Diagnosis not present

## 2020-12-06 DIAGNOSIS — F32A Depression, unspecified: Secondary | ICD-10-CM | POA: Diagnosis not present

## 2020-12-06 DIAGNOSIS — E876 Hypokalemia: Secondary | ICD-10-CM | POA: Diagnosis not present

## 2020-12-06 DIAGNOSIS — E871 Hypo-osmolality and hyponatremia: Secondary | ICD-10-CM

## 2020-12-06 DIAGNOSIS — I350 Nonrheumatic aortic (valve) stenosis: Secondary | ICD-10-CM | POA: Diagnosis not present

## 2020-12-06 DIAGNOSIS — S22030A Wedge compression fracture of third thoracic vertebra, initial encounter for closed fracture: Secondary | ICD-10-CM | POA: Diagnosis not present

## 2020-12-06 DIAGNOSIS — W19XXXA Unspecified fall, initial encounter: Secondary | ICD-10-CM

## 2020-12-06 DIAGNOSIS — N3001 Acute cystitis with hematuria: Secondary | ICD-10-CM

## 2020-12-06 DIAGNOSIS — F419 Anxiety disorder, unspecified: Secondary | ICD-10-CM | POA: Diagnosis not present

## 2020-12-06 DIAGNOSIS — S22030S Wedge compression fracture of third thoracic vertebra, sequela: Secondary | ICD-10-CM

## 2020-12-06 LAB — BASIC METABOLIC PANEL
Anion gap: 14 (ref 5–15)
BUN: 12 mg/dL (ref 8–23)
CO2: 24 mmol/L (ref 22–32)
Calcium: 9.3 mg/dL (ref 8.9–10.3)
Chloride: 94 mmol/L — ABNORMAL LOW (ref 98–111)
Creatinine, Ser: 0.61 mg/dL (ref 0.44–1.00)
GFR, Estimated: >60 mL/min (ref >60)
Glucose, Bld: 122 mg/dL — ABNORMAL HIGH (ref 70–99)
Potassium: 3.2 mmol/L — ABNORMAL LOW (ref 3.5–5.1)
Sodium: 132 mmol/L — ABNORMAL LOW (ref 135–145)

## 2020-12-06 LAB — CBC
HCT: 35.2 % — ABNORMAL LOW (ref 36.0–46.0)
Hemoglobin: 12.6 g/dL (ref 12.0–15.0)
MCH: 32.1 pg (ref 26.0–34.0)
MCHC: 35.8 g/dL (ref 30.0–36.0)
MCV: 89.8 fL (ref 80.0–100.0)
Platelets: 207 10*3/uL (ref 150–400)
RBC: 3.92 MIL/uL (ref 3.87–5.11)
RDW: 11.7 % (ref 11.5–15.5)
WBC: 8.6 10*3/uL (ref 4.0–10.5)
nRBC: 0 % (ref 0.0–0.2)

## 2020-12-06 MED ORDER — CEPHALEXIN 500 MG PO CAPS: 500.0000 mg | ORAL_CAPSULE | Freq: Three times a day (TID) | ORAL | 0 refills | Status: AC

## 2020-12-06 MED ORDER — MAGNESIUM SULFATE 4 GM/100ML IV SOLN
4.0000 g | Freq: Once | INTRAVENOUS | Status: AC
  Administered 2020-12-06: 4 g via INTRAVENOUS
  Filled 2020-12-06: qty 100

## 2020-12-06 MED ORDER — MAGNESIUM OXIDE 400 MG PO CAPS: 400.0000 mg | ORAL_CAPSULE | Freq: Every day | ORAL | 0 refills | Status: DC

## 2020-12-06 MED ORDER — POTASSIUM CHLORIDE CRYS ER 20 MEQ PO TBCR: 20.0000 meq | EXTENDED_RELEASE_TABLET | Freq: Every day | ORAL | 0 refills | Status: DC

## 2020-12-06 MED ORDER — POTASSIUM CHLORIDE CRYS ER 20 MEQ PO TBCR
20.0000 meq | EXTENDED_RELEASE_TABLET | Freq: Two times a day (BID) | ORAL | Status: DC
  Administered 2020-12-06: 20 meq via ORAL
  Filled 2020-12-06: qty 1

## 2020-12-06 NOTE — TOC Initial Note (Signed)
Transition of Care [facility]) - Initial/Assessment Note    Patient Details  Name: Amy Morton MRN: [id number] Date of Birth: 05-Jan-1935  Transition of [facility]) CM/SW Contact:    Eileen Stanford, LCSW Phone Number: 12/06/2020, 3:43 PM  Clinical Narrative: Pt in a contact room, unable to reach pt via phone, also unable to reach pt's Son present at bedside. HH arranged through [place]. Walker ordered through [place].                  Expected Discharge Plan: [place] Barriers to Discharge: No Barriers Identified   Patient Goals and CMS Choice     Choice offered to / list presented to : Patient  Expected Discharge Plan and Services Expected Discharge Plan: [place] In-house Referral: Clinical Social Work   Post Acute Care Choice: [place] arrangements for the past 2 months: [place] Expected Discharge Date: 12/06/20               DME Arranged: [place] DME Agency: [employer] Date DME Agency Contacted: 12/06/20 Time DME Agency Contacted: [phone] Representative spoke with at DME Agency: Suanne Marker [place]: PT,OT [place] Agency: [place] ([place]) Date [place]: 12/06/20 Time [place]: 63 Representative spoke with at [place]  Prior Living Arrangements/Services Living arrangements for the past 2 months: [place] with:: Self Patient language and need for interpreter reviewed:: Yes Do you feel safe going back to the place where you live?: Yes      Need for Family Participation in Patient Care: Yes (Comment) Care giver support system in place?: Yes (comment)   Criminal Activity/Legal Involvement Pertinent to Current Situation/Hospitalization: No - Comment as needed  Activities of Daily Living Home Assistive Devices/Equipment: Environmental consultant (specify type) (at home) ADL Screening (condition at time of admission) Patient's cognitive ability adequate to safely complete  daily activities?: No Is the patient deaf or have difficulty hearing?: Yes Does the patient have difficulty seeing, even when wearing glasses/contacts?: No (Glasses) Does the patient have difficulty concentrating, remembering, or making decisions?: Yes (at times) Patient able to express need for assistance with ADLs?: Yes Does the patient have difficulty dressing or bathing?: No Independently performs ADLs?: Yes (appropriate for developmental age) Does the patient have difficulty walking or climbing stairs?: No Weakness of Legs: Both Weakness of Arms/Hands: Both  Permission Sought/Granted Permission sought to share information with : Family Supports                Emotional Assessment Appearance:: Appears stated age     Orientation: : Oriented to Self,Oriented to Place,Oriented to Situation,Oriented to  Time Alcohol / Substance Use: Not Applicable Psych Involvement: No (comment)  Admission diagnosis:  Syncope and collapse [R55] Fall [W19.XXXA] Elevated troponin [R77.8] Compression fracture of T3 vertebra, initial encounter [facility]) [S22.030A] Patient Active Problem List   Diagnosis Date Noted  . Syncope and collapse 12/05/2020  . Hypokalemia 12/05/2020  . Elevated troponin 12/05/2020  . Compression fracture of T3 vertebra ([place]) 12/05/2020  . Urinary frequency 11/22/2020  . Low back pain 11/22/2020  . Acute lower UTI 09/14/2020  . Heart valve disease 08/23/2020  . Head injury 08/20/2020  . History of CVA (cerebrovascular accident)   . Numbness 06/05/2020  . Hot flashes 02/10/2020  . Asthma without status asthmaticus 12/15/2019  . Essential hypertension 12/15/2019  . Prediabetes 02/11/2019  . Memory difficulty 02/08/2018  . Angina pectoris ([place]) 01/22/2018  .  Non-rheumatic mitral regurgitation 02/12/2017  . Orthostasis 10/20/2016  . Arthritis 05/22/2016  . COPD (chronic obstructive pulmonary disease) ([place]) 11/21/2015  . Hyponatremia 09/30/2015  . CAD (coronary artery  disease) 04/27/2015  . Complex tear of medial meniscus of right knee as current injury 07/26/2014  . Primary osteoarthritis of right knee 07/26/2014  . Anemia 05/12/2014  . Aortic valve stenosis 04/18/2014  . History of peritoneal carcinoma 04/17/2014  . Chronic diarrhea 04/17/2014  . Hyperlipidemia 04/17/2014  . Anxiety and depression 11/13/2013  . Vitamin D deficiency 11/13/2013   PCP:  [name], MD Pharmacy:   [facility] [phone], [place] [address] Phone: [phone] Fax: [phone]     Social Determinants of Health (SDOH) Interventions    Readmission Risk Interventions No flowsheet data found.

## 2020-12-06 NOTE — Discharge Summary (Signed)
Amy Morton at [place] NAME: Amy Morton    MR#:  [id number]  DATE OF BIRTH:  12/21/1934  DATE OF ADMISSION:  12/05/2020 ADMITTING PHYSICIAN: Collier Bullock, MD  DATE OF DISCHARGE: 12/06/2020  4:30 PM  PRIMARY CARE PHYSICIAN: [name], MD    ADMISSION DIAGNOSIS:  Syncope and collapse [R55] Fall [W19.XXXA] Elevated troponin [R77.8] Compression fracture of T3 vertebra, initial encounter ([place] [facility]) [S22.030A]  DISCHARGE DIAGNOSIS:  Principal Problem:   Syncope and collapse Active Problems:   Anxiety and depression   Aortic valve stenosis   CAD (coronary artery disease)   Hyponatremia   Essential hypertension   Acute lower UTI   Hypokalemia   Elevated troponin   Compression fracture of T3 vertebra (HCC)   SECONDARY DIAGNOSIS:   Past Medical History:  Diagnosis Date  . Abnormal mammogram 2007   Recommend close follow up  . Anemia    Iron, b12, SPEP, UPEP normal, 12/2012  . Anxiety   . Aortic stenosis    Severe by [facility] 05/2020  . aortic stenosis   . Arthritis   . Asthma   . CAD (coronary artery disease)    s/p angioplasty 1996  . Chronic cough   . Depression   . Diverticulosis   . Dyspnea   . Elevated LFTs 2010   s/p ultrasound w/ possible fatty liver; GI consult. Resolution 2011  . Gastric ulcer   . GERD (gastroesophageal reflux disease)   . History of colonic polyps    Dr. Vira Agar  . HOH (hard of hearing)   . Hyperlipidemia   . Hypertension   . Peritoneal carcinomatosis [facility]) 2001   Dr. Oliva Bustard & Dr. Claiborne Rigg s/p chemo  . Shingles 2008   T-9 distribution  . Valvular heart disease    Mild to Moderate MR, AS  . Vitamin D deficiency   . Wheezing     HOSPITAL COURSE:   1.  Syncope.  Possibly vasovagal in nature.  The patient does have severe aortic stenosis and if syncope is secondary to severe aortic stenosis the prognosis is poor.  Patient did have electrolyte abnormalities and a UTI which all  could be contributing factors.  Patient felt well and was discharged home.  Patient was not orthostatic on the day of discharge. 2.  Severe aortic stenosis and syncope.  Seen by cardiology.  Follow-up as outpatient.  Cardiology ordered outpatient monitor which was placed prior to discharge.  Echocardiogram showed an EF of 60 to 65% and moderate to severe aortic valve stenosis. 3.  Severe hypomagnesemia.  The patient was given 4 g of IV magnesium for a magnesium level of 1.0.  We will continue magnesium oral supplementation as outpatient.  Recommend checking magnesium level as outpatient 4.  Hypokalemia potassium was replaced during the hospital course and will continue to replace as outpatient.  Recommend checking potassium level as outpatient 5.  Essential hypertension we continued her usual medications.  Patient not orthostatic. 6.  Slight hyponatremia sodium 130 on presentation and 132 upon disposition.  Likely from slight dehydration. 7.  Acute cystitis with hematuria.  Urine culture growing E. coli.  Patient was given a dose of Rocephin in the hospital and will give Keflex for a few more days upon disposition.  We will have to follow-up sensitivities tomorrow. 8.  Compression fracture of T3.  Patient not having pain.  Seen by neurosurgery and no intervention recommended and conservative management. 9.  History of TIA in the  past on aspirin and Plavix and atorvastatin 10.  History of IBS.  Patient takes Imodium occasionally.  Continue electrolyte replacement. 11.  Incidental pulmonary nodule seen on CT scan.  Radiology recommends another CAT scan in another 6 to 12 months.   DISCHARGE CONDITIONS:   Satisfactory  CONSULTS OBTAINED:  Treatment Team:  Kate Sable, MD  DRUG ALLERGIES:   Allergies  Allergen Reactions  . Abilify [Aripiprazole] Other (See Comments)    Burning in Chest Elevated b/p Insomnia  . Celexa [Citalopram]     Caused low sodium level.   . Ibuprofen Other (See  Comments)    GIB    DISCHARGE MEDICATIONS:   Allergies as of 12/06/2020      Reactions   Abilify [aripiprazole] Other (See Comments)   Burning in Chest Elevated b/p Insomnia   Celexa [citalopram]    Caused low sodium level.    Ibuprofen Other (See Comments)   GIB      Medication List    TAKE these medications   acetaminophen 500 MG tablet Commonly known as: TYLENOL Take 1,000 mg by mouth in the morning and at bedtime.   aspirin 81 MG tablet Take 81 mg by mouth daily.   atorvastatin 40 MG tablet Commonly known as: LIPITOR Take 1 tablet (40 mg total) by mouth daily.   cephALEXin 500 MG capsule Commonly known as: KEFLEX Take 1 capsule (500 mg total) by mouth 3 (three) times daily for 4 days.   Cholecalciferol 125 MCG (5000 UT) Tabs Take 5,000 Units by mouth daily.   clonazePAM 0.5 MG tablet Commonly known as: KLONOPIN TAKE 1 TABLET(0.5 MG) BY MOUTH TWICE DAILY AS NEEDED FOR ANXIETY   clopidogrel 75 MG tablet Commonly known as: PLAVIX TAKE 1 TABLET BY MOUTH DAILY WITH BREAKFAST   colestipol 1 g tablet Commonly known as: COLESTID TAKE 1 TABLET(1 GRAM) BY MOUTH DAILY   DULoxetine 30 MG capsule Commonly known as: Cymbalta Take 1 capsule (30 mg total) by mouth daily for 14 days, THEN 2 capsules (60 mg total) daily. Start taking on: November 22, 2020   isosorbide mononitrate 30 MG 24 hr tablet Commonly known as: IMDUR TAKE 1 AND 1/2 TABLETS(45 MG) BY MOUTH DAILY   Magnesium Oxide 400 MG Caps Take 1 capsule (400 mg total) by mouth daily.   metoprolol tartrate 50 MG tablet Commonly known as: LOPRESSOR TAKE 1 TABLET(50 MG) BY MOUTH TWICE DAILY   multivitamin capsule Take 1 capsule by mouth daily.   nitroGLYCERIN 0.4 MG SL tablet Commonly known as: NITROSTAT PLACE 1 TABLET UNDER THE TONGUE EVERY 5 MINUTES AS NEEDED FOR CHEST PAIN, 3 DOSES MAX   omega-3 acid ethyl esters 1 g capsule Commonly known as: LOVAZA TAKE 2 CAPSULES BY MOUTH TWICE DAILY   Osteo  Advance Tabs Take 1 tablet by mouth daily.   pantoprazole 40 MG tablet Commonly known as: PROTONIX TAKE 1 TABLET(40 MG) BY MOUTH DAILY   potassium chloride SA 20 MEQ tablet Commonly known as: KLOR-CON Take 1 tablet (20 mEq total) by mouth daily.   Ubiquinol 100 MG Caps Take 100 mg by mouth every morning.   vitamin B-12 250 MCG tablet Commonly known as: CYANOCOBALAMIN Take 250 mcg by mouth daily.   VITAMIN C PO Take by mouth.            Durable Medical Equipment  (From admission, onward)         Start     Ordered   12/06/20 1415  For home use  only DME Walker rolling  Once       Question Answer Comment  Walker: With 5 Inch Wheels   Patient needs a walker to treat with the following condition Unsteady gait when walking      12/06/20 1414           DISCHARGE INSTRUCTIONS:  Follow-up PMD 5 days Follow-up cardiology 1 to 2 weeks.  If you experience worsening of your admission symptoms, develop shortness of breath, life threatening emergency, suicidal or homicidal thoughts you must seek medical attention immediately by calling 911 or calling your MD immediately  if symptoms less severe.  You Must read complete instructions/literature along with all the possible adverse reactions/side effects for all the Medicines you take and that have been prescribed to you. Take any new Medicines after you have completely understood and accept all the possible adverse reactions/side effects.   Please note  You were cared for by a hospitalist during your hospital stay. If you have any questions about your discharge medications or the care you received while you were in the hospital after you are discharged, you can call the unit and asked to speak with the hospitalist on call if the hospitalist that took care of you is not available. Once you are discharged, your primary care physician will handle any further medical issues. Please note that NO REFILLS for any discharge medications  will be authorized once you are discharged, as it is imperative that you return to your primary care physician (or establish a relationship with a primary care physician if you do not have one) for your aftercare needs so that they can reassess your need for medications and monitor your lab values.    Today   CHIEF COMPLAINT:   Chief Complaint  Patient presents with  . Fall    HISTORY OF PRESENT ILLNESS:  Amy Morton  is a 86 y.o. female was found in the bathtub after a fall and does not recall what happened   VITAL SIGNS:  Blood pressure (!) 164/61, pulse 79, temperature 98 F (36.7 C), temperature source Oral, resp. rate 20, height 4\' 11"  (1.499 m), weight 63.2 kg, SpO2 96 %.  I/O:    Intake/Output Summary (Last 24 hours) at 12/06/2020 1713 Last data filed at 12/06/2020 0940 Gross per 24 hour  Intake 439.4 ml  Output 250 ml  Net 189.4 ml    PHYSICAL EXAMINATION:  GENERAL:  86 y.o.-year-old patient lying in the bed with no acute distress.  EYES: Pupils equal, round, reactive to light and accommodation. No scleral icterus. Extraocular muscles intact.  HEENT: Head atraumatic, normocephalic. Oropharynx and nasopharynx clear.   LUNGS: Normal breath sounds bilaterally, no wheezing, rales,rhonchi or crepitation. No use of accessory muscles of respiration.  CARDIOVASCULAR: S1, S2 normal. No murmurs, rubs, or gallops.  ABDOMEN: Soft, non-tender, non-distended.  EXTREMITIES: No pedal edema.  NEUROLOGIC: Cranial nerves II through XII are intact. Muscle strength 5/5 in all extremities. Sensation intact. Gait not checked.  PSYCHIATRIC: The patient is alert and oriented x 3.  SKIN: No obvious rash, lesion, or ulcer.   DATA REVIEW:   CBC Recent Labs  Lab 12/06/20 0403  WBC 8.6  HGB 12.6  HCT 35.2*  PLT 207    Chemistries  Recent Labs  Lab 12/05/20 0934 12/05/20 1654 12/06/20 0403  NA 130*  --  132*  K 3.0*  --  3.2*  CL 91*  --  94*  CO2 26  --  24  GLUCOSE  139*  --  122*  BUN 9  --  12  CREATININE 0.65  --  0.61  CALCIUM 9.2  --  9.3  MG  --  1.0*  --   AST 35  --   --   ALT 21  --   --   ALKPHOS 44  --   --   BILITOT 1.0  --   --     Microbiology Results  Results for orders placed or performed during the hospital encounter of 12/05/20  Urine Culture     Status: Abnormal (Preliminary result)   Collection Time: 12/05/20 10:38 AM   Specimen: Urine, Random  Result Value Ref Range Status   Specimen Description   Final    URINE, RANDOM Performed at [facility], [address] [place], [place] [address]    Special Requests   Final    NONE Performed at [facility], [address], [place], [place] [address]    Culture (A)  Final    >=100,000 COLONIES/mL ESCHERICHIA COLI SUSCEPTIBILITIES TO FOLLOW Performed at [facility], [place] [address] [place], [place] [address]    Report Status PENDING  Incomplete  Resp Panel by RT-PCR (Flu A&B, Covid) Nasopharyngeal Swab     Status: None   Collection Time: 12/05/20  1:09 PM   Specimen: Nasopharyngeal Swab; Nasopharyngeal(NP) swabs in vial transport medium  Result Value Ref Range Status   SARS Coronavirus 2 by RT PCR NEGATIVE NEGATIVE Final    Comment: (NOTE) SARS-CoV-2 target nucleic acids are NOT DETECTED.  The SARS-CoV-2 RNA is generally detectable in upper respiratory specimens during the acute phase of infection. The lowest concentration of SARS-CoV-2 viral copies this assay can detect is 138 copies/mL. A negative result does not preclude SARS-Cov-2 infection and should not be used as the sole basis for treatment or other patient management decisions. A negative result may occur with  improper specimen collection/handling, submission of specimen other than nasopharyngeal swab, presence of viral mutation(s) within the areas targeted by this assay, and inadequate number of viral copies(<138 copies/mL). A negative result must be combined with clinical  observations, patient history, and epidemiological information. The expected result is Negative.  Fact Sheet for Patients:  EntrepreneurPulse.com.au  Fact Sheet for Healthcare Providers:  IncredibleEmployment.be  This test is no t yet approved or cleared by the [place] FDA and  has been authorized for detection and/or diagnosis of SARS-CoV-2 by FDA under an Emergency Use Authorization (EUA). This EUA will remain  in effect (meaning this test can be used) for the duration of the COVID-19 declaration under Section 564(b)(1) of the Act, 21 U.S.C.section 360bbb-3(b)(1), unless the authorization is terminated  or revoked sooner.       Influenza A by PCR NEGATIVE NEGATIVE Final   Influenza B by PCR NEGATIVE NEGATIVE Final    Comment: (NOTE) The Xpert Xpress SARS-CoV-2/FLU/RSV plus assay is intended as an aid in the diagnosis of influenza from Nasopharyngeal swab specimens and should not be used as a sole basis for treatment. Nasal washings and aspirates are unacceptable for Xpert Xpress SARS-CoV-2/FLU/RSV testing.  Fact Sheet for Patients: EntrepreneurPulse.com.au  Fact Sheet for Healthcare Providers: IncredibleEmployment.be  This test is not yet approved or cleared by the [place] FDA and has been authorized for detection and/or diagnosis of SARS-CoV-2 by FDA under an Emergency Use Authorization (EUA). This EUA will remain in effect (meaning this test can be used) for the duration of the COVID-19 declaration under Section  564(b)(1) of the Act, 21 U.S.C. section 360bbb-3(b)(1), unless the authorization is terminated or revoked.  Performed at [facility], [address] [place], [place] [address]     RADIOLOGY:  CT Head Wo Contrast  Result Date: 12/05/2020 CLINICAL DATA:  Fall EXAM: CT HEAD WITHOUT CONTRAST TECHNIQUE: Contiguous axial images were obtained from the base of the skull  through the vertex without intravenous contrast. COMPARISON:  None. FINDINGS: Brain: There is atrophy and chronic small vessel disease changes. No acute intracranial abnormality. Specifically, no hemorrhage, hydrocephalus, mass lesion, acute infarction, or significant intracranial injury. Vascular: No hyperdense vessel or unexpected calcification. Skull: No acute calvarial abnormality. Sinuses/Orbits: Visualized paranasal sinuses and mastoids clear. Orbital soft tissues unremarkable. Other: None IMPRESSION: Atrophy, chronic microvascular disease. No acute intracranial abnormality. Electronically Signed   By: Rolm Baptise M.D.   On: 12/05/2020 11:26   CT Cervical Spine Wo Contrast  Result Date: 12/05/2020 CLINICAL DATA:  Fall in bathtub. EXAM: CT CERVICAL SPINE WITHOUT CONTRAST TECHNIQUE: Multidetector CT imaging of the cervical spine was performed without intravenous contrast. Multiplanar CT image reconstructions were also generated. COMPARISON:  08/18/2020 FINDINGS: Alignment: Normal Skull base and vertebrae: No acute fracture. No primary bone lesion or focal pathologic process. Soft tissues and spinal canal: No prevertebral fluid or swelling. No visible canal hematoma. Disc levels: Early degenerative disc disease and early spurring with disc. Mild-to-moderate bilateral degenerative facet space narrowing disease diffusely. Upper chest: No acute findings Other: None IMPRESSION: Cervical spondylosis.  No acute bony abnormality. Electronically Signed   By: Rolm Baptise M.D.   On: 12/05/2020 11:06   CT T-SPINE NO CHARGE  Result Date: 12/05/2020 CLINICAL DATA:  Fall.  Found down.  Epigastric pain. EXAM: CT THORACIC SPINE WITHOUT CONTRAST TECHNIQUE: Multiplanar reformatted images of the thoracic spine were generated from the data acquired during chest CT of the same date, dictated separately. COMPARISON:  Chest CT 06/15/2020 FINDINGS: Alignment: Normal. Vertebrae: There is a new mild superior endplate compression  deformity at T3 which is likely acute. This results in approximately 10% loss of vertebral body height, and no significant osseous retropulsion. No other fractures are seen. Paraspinal and other soft tissues: No significant paraspinal hematoma. Additional thoracic findings deferred to separate CT of the chest. Disc levels: Degenerative changes throughout the thoracic spine with endplate osteophytes most advanced at T7-8 and T8-9. No large disc herniation or high-grade foraminal narrowing seen. IMPRESSION: 1. Interval mild superior endplate compression deformity at T3, likely acute. No osseous retropulsion or signs of dynamic instability. 2. No other acute osseous findings. 3. Mild thoracic spondylosis. 4. Additional chest CT findings dictated separately. Electronically Signed   By: Richardean Sale M.D.   On: 12/05/2020 10:43   ECHOCARDIOGRAM COMPLETE  Result Date: 12/05/2020    ECHOCARDIOGRAM REPORT   Patient Name:   Amy Morton Date of Exam: 12/05/2020 Medical Rec #:  [id number]          Height:       59.0 in Accession #:    [id number]         Weight:       139.3 lb Date of Birth:  1934/08/19          BSA:          1.582 m Patient Age:    56 years           BP:           195/92 mmHg Patient Gender: F  HR:           102 bpm. Exam Location:  [facility] Procedure: 2D Echo, Cardiac Doppler and Color Doppler                                 MODIFIED REPORT: This report was modified by Debbe OdeaBrian Agbor-Etang MD on 12/05/2020 due to ivc normal,                                 aortic stenosis.  Indications:     Syncope R55  History:         Patient has prior history of Echocardiogram examinations, most                  recent 03/06/2020. Risk Factors:Hypertension. Aortic stenosis.  Sonographer:     Cristela BlueJerry Hege RDCS (AE) Referring Phys:  Lucile ShuttersCHUKWU AGBATA Diagnosing Phys: Debbe OdeaBrian Agbor-Etang MD  Sonographer Comments: Suboptimal apical window and no parasternal window. IMPRESSIONS  1. Left ventricular ejection  fraction, by estimation, is 60 to 65%. The left ventricle has normal function. The left ventricle has no regional wall motion abnormalities. Left ventricular diastolic parameters are consistent with Grade I diastolic dysfunction (impaired relaxation).  2. Right ventricular systolic function is normal. The right ventricular size is normal.  3. Left atrial size was mildly dilated.  4. The mitral valve is normal in structure. Mild mitral valve regurgitation.  5. Aortc valve is heavily calcified , DVI=0.29, AVA 0.94cm2. stenosis appears severe visually.. The aortic valve is calcified. Aortic valve regurgitation is not visualized. Moderate to severe aortic valve stenosis.  6. The inferior vena cava is normal in size with greater than 50% respiratory variability, suggesting right atrial pressure of 3 mmHg. FINDINGS  Left Ventricle: Left ventricular ejection fraction, by estimation, is 60 to 65%. The left ventricle has normal function. The left ventricle has no regional wall motion abnormalities. The left ventricular internal cavity size was normal in size. There is  no left ventricular hypertrophy. Left ventricular diastolic parameters are consistent with Grade I diastolic dysfunction (impaired relaxation). Right Ventricle: The right ventricular size is normal. No increase in right ventricular wall thickness. Right ventricular systolic function is normal. Left Atrium: Left atrial size was mildly dilated. Right Atrium: Right atrial size was normal in size. Pericardium: There is no evidence of pericardial effusion. Mitral Valve: The mitral valve is normal in structure. Mild mitral valve regurgitation. Tricuspid Valve: The tricuspid valve is grossly normal. Tricuspid valve regurgitation is not demonstrated. Aortic Valve: Aortc valve is heavily calcified , DVI=0.29, AVA 0.94cm2. stenosis appears severe visually. The aortic valve is calcified. Aortic valve regurgitation is not visualized. Moderate to severe aortic stenosis is  present. Aortic valve mean gradient measures 16.8 mmHg. Aortic valve peak gradient measures 27.2 mmHg. Aortic valve area, by VTI measures 1.00 cm. Pulmonic Valve: The pulmonic valve was not well visualized. Pulmonic valve regurgitation is not visualized. Aorta: The aortic root was not well visualized. Venous: The inferior vena cava is normal in size with greater than 50% respiratory variability, suggesting right atrial pressure of 3 mmHg. IAS/Shunts: No atrial level shunt detected by color flow Doppler.  LEFT VENTRICLE PLAX 2D LVIDd:         3.09 cm  Diastology LVIDs:         2.17 cm  LV e' medial:    5.77 cm/s LV  PW:         1.03 cm  LV E/e' medial:  11.2 LV IVS:        0.82 cm  LV e' lateral:   5.44 cm/s LVOT diam:     2.00 cm  LV E/e' lateral: 11.9 LV SV:         45 LV SV Index:   29 LVOT Area:     3.14 cm  RIGHT VENTRICLE RV Basal diam:  2.72 cm RV S prime:     14.30 cm/s TAPSE (M-mode): 2.8 cm LEFT ATRIUM             Index       RIGHT ATRIUM           Index LA diam:        3.10 cm 1.96 cm/m  RA Area:     10.20 cm LA Vol (A2C):   71.5 ml 45.20 ml/m RA Volume:   17.60 ml  11.13 ml/m LA Vol (A4C):   59.0 ml 37.30 ml/m LA Biplane Vol: 65.3 ml 41.28 ml/m  AORTIC VALVE                    PULMONIC VALVE AV Area (Vmax):    1.09 cm     PV Vmax:        0.92 m/s AV Area (Vmean):   1.14 cm     PV Peak grad:   3.4 mmHg AV Area (VTI):     1.00 cm     RVOT Peak grad: 4 mmHg AV Vmax:           260.75 cm/s AV Vmean:          189.500 cm/s AV VTI:            0.451 m AV Peak Grad:      27.2 mmHg AV Mean Grad:      16.8 mmHg LVOT Vmax:         90.70 cm/s LVOT Vmean:        68.800 cm/s LVOT VTI:          0.144 m LVOT/AV VTI ratio: 0.32 MITRAL VALVE                TRICUSPID VALVE MV Area (PHT): 5.13 cm     TR Peak grad:   21.7 mmHg MV Decel Time: 148 msec     TR Vmax:        233.00 cm/s MV E velocity: 64.70 cm/s MV A velocity: 122.00 cm/s  SHUNTS MV E/A ratio:  0.53         Systemic VTI:  0.14 m                              Systemic Diam: 2.00 cm Kate Sable MD Electronically signed by Kate Sable MD Signature Date/Time: 12/05/2020/3:12:59 PM    Final (Updated)    CT CHEST ABDOMEN PELVIS WO CONTRAST  Result Date: 12/05/2020 CLINICAL DATA:  Unwitnessed fall in bathtub. EXAM: CT CHEST, ABDOMEN AND PELVIS WITHOUT CONTRAST TECHNIQUE: Multidetector CT imaging of the chest, abdomen and pelvis was performed following the standard protocol without IV contrast. COMPARISON:  06/15/2020 FINDINGS: CT CHEST FINDINGS Cardiovascular: Diffuse coronary artery calcifications, mitral valve and aortic valve calcifications and scattered aortic calcifications. Heart is normal size. No aneurysm. Mediastinum/Nodes: No mediastinal, hilar, or axillary adenopathy. Trachea and esophagus are unremarkable. Thyroid unremarkable. No mediastinal hematoma. Lungs/Pleura:  Dependent atelectasis in the lungs. No effusions. Posterior right upper lobe nodule measures 8 mm on image 42, new since prior study. No effusions or pneumothorax. Musculoskeletal: Chest wall soft tissues are unremarkable. No acute bony abnormality. CT ABDOMEN PELVIS FINDINGS Hepatobiliary: Prior cholecystectomy. No focal hepatic abnormality or evidence of a hepatic injury. Pancreas: No focal abnormality or ductal dilatation. Spleen: No splenic injury or perisplenic hematoma. Adrenals/Urinary Tract: No adrenal hemorrhage or renal injury identified. Bladder is unremarkable. No hydronephrosis. Bilateral perinephric stranding is similar to prior study. No evidence of perinephric hematoma or adrenal hemorrhage. Stomach/Bowel: Sigmoid diverticulosis. No active diverticulitis. Stomach and small bowel decompressed, unremarkable. Vascular/Lymphatic: Aortic atherosclerosis. No evidence of aneurysm or adenopathy. Reproductive: Prior hysterectomy.  No adnexal masses. Other: No free fluid or free air. Musculoskeletal: No acute bony abnormality. IMPRESSION: No acute findings or evidence of  significant traumatic injury in the chest, abdomen or pelvis. 7 mm posterior right upper lobe pulmonary nodule. Non-contrast chest CT at 6-12 months is recommended. If the nodule is stable at time of repeat CT, then future CT at 18-24 months (from today's scan) is considered optional for low-risk patients, but is recommended for high-risk patients. This recommendation follows the consensus statement: Guidelines for Management of Incidental Pulmonary Nodules Detected on CT Images: From the Fleischner Society 2017; Radiology 2017; 284:228-243. Diffuse coronary artery disease.  Aortic atherosclerosis. Sigmoid diverticulosis.  No active diverticulitis. Electronically Signed   By: Rolm Baptise M.D.   On: 12/05/2020 10:55    Management plans discussed with the patient, family and they are in agreement.  CODE STATUS:     Code Status Orders  (From admission, onward)         Start     Ordered   12/05/20 1340  Full code  Continuous        12/05/20 1341        Code Status History    Date Active Date Inactive Code Status Order ID Comments User Context   06/06/2020 0034 06/06/2020 2024 Full Code [id number]  Criss Alvine, DO Inpatient   05/16/2020 1559 05/17/2020 0014 Full Code [id number]  Wellington Hampshire, MD Inpatient   01/22/2018 2003 01/25/2018 2254 Partial Code [id number]  Gorden Harms, MD ED   01/22/2018 2001 01/22/2018 2002 Full Code [id number]  Salary, Avel Peace, MD ED   04/27/2015 1302 04/29/2015 1500 Full Code [id number]  Jettie Booze, MD Inpatient   Advance Care Planning Activity      TOTAL TIME TAKING CARE OF THIS PATIENT: 35 minutes.    Loletha Grayer M.D on 12/06/2020 at 5:13 PM  Between 7am to 6pm - Pager - [phone]  After 6pm go to [email or web address] - password [email or web address]  [facility]  CC: Primary care physician; [name], MD

## 2020-12-06 NOTE — Progress Notes (Signed)
Progress Note  Patient Name: Amy Morton Date of Encounter: 12/06/2020  [facility] Cardiologist: Kathlyn Sacramento, MD   Subjective   Long discussion with patient and son at the bedside concerning recent events Got up in the middle night presumably to go to the bathroom She does have a baseline unsteady gait, prior history of falls per the son, typically walks with a walker (unclear if she was walking with a walker prior to syncope/fall at home) Son found her in the bathtub, no trauma to her head,  T3 compression fracture noted on imaging, sore spot upper back otherwise no complaints -She has been taking Klonopin for anxiety/depression Dose was raised and it was decreased, patient and son unaware what dose she was taking whether it was 0.25 or 0.5  other family member does the medications  Blood pressure running high, does not appear to be orthostatic Telemetry reviewed no arrhythmia  Inpatient Medications    Scheduled Meds: . acetaminophen  1,000 mg Oral BID  . vitamin C  500 mg Oral Daily  . aspirin EC  81 mg Oral Daily  . atorvastatin  40 mg Oral QHS  . cholecalciferol  5,000 Units Oral Daily  . clopidogrel  75 mg Oral Q breakfast  . colestipol  1 g Oral Daily  . DULoxetine  60 mg Oral Daily  . enoxaparin (LOVENOX) injection  40 mg Subcutaneous Q24H  . isosorbide mononitrate  45 mg Oral Daily  . metoprolol tartrate  50 mg Oral BID  . multivitamin with minerals  1 tablet Oral Daily  . omega-3 acid ethyl esters  2 capsule Oral BID  . pantoprazole  40 mg Oral QAC breakfast  . potassium chloride  20 mEq Oral BID  . sodium chloride flush  3 mL Intravenous Q12H  . vitamin B-12  250 mcg Oral Daily   Continuous Infusions: . cefTRIAXone (ROCEPHIN)  IV Stopped (12/05/20 1808)  . magnesium sulfate bolus IVPB 4 g (12/06/20 1008)   PRN Meds: clonazePAM, nitroGLYCERIN, ondansetron **OR** ondansetron (ZOFRAN) IV, traMADol   Vital Signs    Vitals:   12/05/20 1703  12/05/20 1948 12/06/20 0345 12/06/20 0951  BP: (!) 177/74 (!) 187/93 (!) 166/83 (!) 164/61  Pulse: 72 86 77 79  Resp: 17 18 20 20   Temp: 98.2 F (36.8 C) 97.8 F (36.6 C) 97.8 F (36.6 C) 98 F (36.7 C)  TempSrc: Oral Oral Oral Oral  SpO2: 94% 95% 96% 96%  Weight: 63.2 kg     Height: 4\' 11"  (1.499 m)       Intake/Output Summary (Last 24 hours) at 12/06/2020 1059 Last data filed at 12/06/2020 0940 Gross per 24 hour  Intake 679.4 ml  Output 250 ml  Net 429.4 ml   Last 3 Weights 12/05/2020 12/05/2020 11/22/2020  Weight (lbs) 139 lb 6.4 oz 139 lb 5.3 oz 126 lb  Weight (kg) 63.231 kg 63.2 kg 57.153 kg      Telemetry    Normal sinus rhythm- Personally Reviewed  ECG     - Personally Reviewed  Physical Exam   GEN: No acute distress.   Neck: No JVD Cardiac: RRR, no murmurs, rubs, or gallops.  Respiratory: Clear to auscultation bilaterally. GI: Soft, nontender, non-distended  MS: No edema; No deformity. Neuro:  Nonfocal  Psych: Normal affect   Labs    High Sensitivity Troponin:   Recent Labs  Lab 12/05/20 0934 12/05/20 1134 12/05/20 1337 12/05/20 1654  TROPONINIHS 53* 95* 117* 143*  Chemistry Recent Labs  Lab 12/05/20 0934 12/06/20 0403  NA 130* 132*  K 3.0* 3.2*  CL 91* 94*  CO2 26 24  GLUCOSE 139* 122*  BUN 9 12  CREATININE 0.65 0.61  CALCIUM 9.2 9.3  PROT 7.8  --   ALBUMIN 4.3  --   AST 35  --   ALT 21  --   ALKPHOS 44  --   BILITOT 1.0  --   GFRNONAA >60 >60  ANIONGAP 13 14     Hematology Recent Labs  Lab 12/05/20 0934 12/06/20 0403  WBC 14.5* 8.6  RBC 4.19 3.92  HGB 13.3 12.6  HCT 37.2 35.2*  MCV 88.8 89.8  MCH 31.7 32.1  MCHC 35.8 35.8  RDW 11.6 11.7  PLT 192 207    BNPNo results for input(s): BNP, PROBNP in the last 168 hours.   DDimer No results for input(s): DDIMER in the last 168 hours.   Radiology    CT Head Wo Contrast  Result Date: 12/05/2020 CLINICAL DATA:  Fall EXAM: CT HEAD WITHOUT CONTRAST TECHNIQUE:  Contiguous axial images were obtained from the base of the skull through the vertex without intravenous contrast. COMPARISON:  None. FINDINGS: Brain: There is atrophy and chronic small vessel disease changes. No acute intracranial abnormality. Specifically, no hemorrhage, hydrocephalus, mass lesion, acute infarction, or significant intracranial injury. Vascular: No hyperdense vessel or unexpected calcification. Skull: No acute calvarial abnormality. Sinuses/Orbits: Visualized paranasal sinuses and mastoids clear. Orbital soft tissues unremarkable. Other: None IMPRESSION: Atrophy, chronic microvascular disease. No acute intracranial abnormality. Electronically Signed   By: Rolm Baptise M.D.   On: 12/05/2020 11:26   CT Cervical Spine Wo Contrast  Result Date: 12/05/2020 CLINICAL DATA:  Fall in bathtub. EXAM: CT CERVICAL SPINE WITHOUT CONTRAST TECHNIQUE: Multidetector CT imaging of the cervical spine was performed without intravenous contrast. Multiplanar CT image reconstructions were also generated. COMPARISON:  08/18/2020 FINDINGS: Alignment: Normal Skull base and vertebrae: No acute fracture. No primary bone lesion or focal pathologic process. Soft tissues and spinal canal: No prevertebral fluid or swelling. No visible canal hematoma. Disc levels: Early degenerative disc disease and early spurring with disc. Mild-to-moderate bilateral degenerative facet space narrowing disease diffusely. Upper chest: No acute findings Other: None IMPRESSION: Cervical spondylosis.  No acute bony abnormality. Electronically Signed   By: Rolm Baptise M.D.   On: 12/05/2020 11:06   CT T-SPINE NO CHARGE  Result Date: 12/05/2020 CLINICAL DATA:  Fall.  Found down.  Epigastric pain. EXAM: CT THORACIC SPINE WITHOUT CONTRAST TECHNIQUE: Multiplanar reformatted images of the thoracic spine were generated from the data acquired during chest CT of the same date, dictated separately. COMPARISON:  Chest CT 06/15/2020 FINDINGS: Alignment:  Normal. Vertebrae: There is a new mild superior endplate compression deformity at T3 which is likely acute. This results in approximately 10% loss of vertebral body height, and no significant osseous retropulsion. No other fractures are seen. Paraspinal and other soft tissues: No significant paraspinal hematoma. Additional thoracic findings deferred to separate CT of the chest. Disc levels: Degenerative changes throughout the thoracic spine with endplate osteophytes most advanced at T7-8 and T8-9. No large disc herniation or high-grade foraminal narrowing seen. IMPRESSION: 1. Interval mild superior endplate compression deformity at T3, likely acute. No osseous retropulsion or signs of dynamic instability. 2. No other acute osseous findings. 3. Mild thoracic spondylosis. 4. Additional chest CT findings dictated separately. Electronically Signed   By: Richardean Sale M.D.   On: 12/05/2020 10:43   ECHOCARDIOGRAM  COMPLETE  Result Date: 12/05/2020    ECHOCARDIOGRAM REPORT   Patient Name:   Amy Morton Date of Exam: 12/05/2020 Medical Rec #:  [id number]          Height:       59.0 in Accession #:    [id number]         Weight:       139.3 lb Date of Birth:  01-Jul-1935          BSA:          1.582 m Patient Age:    86 years           BP:           195/92 mmHg Patient Gender: F                  HR:           102 bpm. Exam Location:  [facility] Procedure: 2D Echo, Cardiac Doppler and Color Doppler                                 MODIFIED REPORT: This report was modified by Kate Sable MD on 12/05/2020 due to ivc normal,                                 aortic stenosis.  Indications:     Syncope R55  History:         Patient has prior history of Echocardiogram examinations, most                  recent 03/06/2020. Risk Factors:Hypertension. Aortic stenosis.  Sonographer:     Sherrie Sport RDCS (AE) Referring Phys:  Collier Bullock Diagnosing Phys: Kate Sable MD  Sonographer Comments: Suboptimal apical window and no  parasternal window. IMPRESSIONS  1. Left ventricular ejection fraction, by estimation, is 60 to 65%. The left ventricle has normal function. The left ventricle has no regional wall motion abnormalities. Left ventricular diastolic parameters are consistent with Grade I diastolic dysfunction (impaired relaxation).  2. Right ventricular systolic function is normal. The right ventricular size is normal.  3. Left atrial size was mildly dilated.  4. The mitral valve is normal in structure. Mild mitral valve regurgitation.  5. Aortc valve is heavily calcified , DVI=0.29, AVA 0.94cm2. stenosis appears severe visually.. The aortic valve is calcified. Aortic valve regurgitation is not visualized. Moderate to severe aortic valve stenosis.  6. The inferior vena cava is normal in size with greater than 50% respiratory variability, suggesting right atrial pressure of 3 mmHg. FINDINGS  Left Ventricle: Left ventricular ejection fraction, by estimation, is 60 to 65%. The left ventricle has normal function. The left ventricle has no regional wall motion abnormalities. The left ventricular internal cavity size was normal in size. There is  no left ventricular hypertrophy. Left ventricular diastolic parameters are consistent with Grade I diastolic dysfunction (impaired relaxation). Right Ventricle: The right ventricular size is normal. No increase in right ventricular wall thickness. Right ventricular systolic function is normal. Left Atrium: Left atrial size was mildly dilated. Right Atrium: Right atrial size was normal in size. Pericardium: There is no evidence of pericardial effusion. Mitral Valve: The mitral valve is normal in structure. Mild mitral valve regurgitation. Tricuspid Valve: The tricuspid valve is grossly normal. Tricuspid valve regurgitation is not demonstrated. Aortic Valve: Aortc valve is  heavily calcified , DVI=0.29, AVA 0.94cm2. stenosis appears severe visually. The aortic valve is calcified. Aortic valve  regurgitation is not visualized. Moderate to severe aortic stenosis is present. Aortic valve mean gradient measures 16.8 mmHg. Aortic valve peak gradient measures 27.2 mmHg. Aortic valve area, by VTI measures 1.00 cm. Pulmonic Valve: The pulmonic valve was not well visualized. Pulmonic valve regurgitation is not visualized. Aorta: The aortic root was not well visualized. Venous: The inferior vena cava is normal in size with greater than 50% respiratory variability, suggesting right atrial pressure of 3 mmHg. IAS/Shunts: No atrial level shunt detected by color flow Doppler.  LEFT VENTRICLE PLAX 2D LVIDd:         3.09 cm  Diastology LVIDs:         2.17 cm  LV e' medial:    5.77 cm/s LV PW:         1.03 cm  LV E/e' medial:  11.2 LV IVS:        0.82 cm  LV e' lateral:   5.44 cm/s LVOT diam:     2.00 cm  LV E/e' lateral: 11.9 LV SV:         45 LV SV Index:   29 LVOT Area:     3.14 cm  RIGHT VENTRICLE RV Basal diam:  2.72 cm RV S prime:     14.30 cm/s TAPSE (M-mode): 2.8 cm LEFT ATRIUM             Index       RIGHT ATRIUM           Index LA diam:        3.10 cm 1.96 cm/m  RA Area:     10.20 cm LA Vol (A2C):   71.5 ml 45.20 ml/m RA Volume:   17.60 ml  11.13 ml/m LA Vol (A4C):   59.0 ml 37.30 ml/m LA Biplane Vol: 65.3 ml 41.28 ml/m  AORTIC VALVE                    PULMONIC VALVE AV Area (Vmax):    1.09 cm     PV Vmax:        0.92 m/s AV Area (Vmean):   1.14 cm     PV Peak grad:   3.4 mmHg AV Area (VTI):     1.00 cm     RVOT Peak grad: 4 mmHg AV Vmax:           260.75 cm/s AV Vmean:          189.500 cm/s AV VTI:            0.451 m AV Peak Grad:      27.2 mmHg AV Mean Grad:      16.8 mmHg LVOT Vmax:         90.70 cm/s LVOT Vmean:        68.800 cm/s LVOT VTI:          0.144 m LVOT/AV VTI ratio: 0.32 MITRAL VALVE                TRICUSPID VALVE MV Area (PHT): 5.13 cm     TR Peak grad:   21.7 mmHg MV Decel Time: 148 msec     TR Vmax:        233.00 cm/s MV E velocity: 64.70 cm/s MV A velocity: 122.00 cm/s  SHUNTS MV  E/A ratio:  0.53         Systemic VTI:  0.14 m                             Systemic Diam: 2.00 cm Kate Sable MD Electronically signed by Kate Sable MD Signature Date/Time: 12/05/2020/3:12:59 PM    Final (Updated)    CT CHEST ABDOMEN PELVIS WO CONTRAST  Result Date: 12/05/2020 CLINICAL DATA:  Unwitnessed fall in bathtub. EXAM: CT CHEST, ABDOMEN AND PELVIS WITHOUT CONTRAST TECHNIQUE: Multidetector CT imaging of the chest, abdomen and pelvis was performed following the standard protocol without IV contrast. COMPARISON:  06/15/2020 FINDINGS: CT CHEST FINDINGS Cardiovascular: Diffuse coronary artery calcifications, mitral valve and aortic valve calcifications and scattered aortic calcifications. Heart is normal size. No aneurysm. Mediastinum/Nodes: No mediastinal, hilar, or axillary adenopathy. Trachea and esophagus are unremarkable. Thyroid unremarkable. No mediastinal hematoma. Lungs/Pleura: Dependent atelectasis in the lungs. No effusions. Posterior right upper lobe nodule measures 8 mm on image 42, new since prior study. No effusions or pneumothorax. Musculoskeletal: Chest wall soft tissues are unremarkable. No acute bony abnormality. CT ABDOMEN PELVIS FINDINGS Hepatobiliary: Prior cholecystectomy. No focal hepatic abnormality or evidence of a hepatic injury. Pancreas: No focal abnormality or ductal dilatation. Spleen: No splenic injury or perisplenic hematoma. Adrenals/Urinary Tract: No adrenal hemorrhage or renal injury identified. Bladder is unremarkable. No hydronephrosis. Bilateral perinephric stranding is similar to prior study. No evidence of perinephric hematoma or adrenal hemorrhage. Stomach/Bowel: Sigmoid diverticulosis. No active diverticulitis. Stomach and small bowel decompressed, unremarkable. Vascular/Lymphatic: Aortic atherosclerosis. No evidence of aneurysm or adenopathy. Reproductive: Prior hysterectomy.  No adnexal masses. Other: No free fluid or free air. Musculoskeletal: No acute  bony abnormality. IMPRESSION: No acute findings or evidence of significant traumatic injury in the chest, abdomen or pelvis. 7 mm posterior right upper lobe pulmonary nodule. Non-contrast chest CT at 6-12 months is recommended. If the nodule is stable at time of repeat CT, then future CT at 18-24 months (from today's scan) is considered optional for low-risk patients, but is recommended for high-risk patients. This recommendation follows the consensus statement: Guidelines for Management of Incidental Pulmonary Nodules Detected on CT Images: From the Fleischner Society 2017; Radiology 2017; 284:228-243. Diffuse coronary artery disease.  Aortic atherosclerosis. Sigmoid diverticulosis.  No active diverticulitis. Electronically Signed   By: Rolm Baptise M.D.   On: 12/05/2020 10:55    Cardiac Studies   Echocardiogram as above  Patient Profile     Ms. Fenster is an 86 year old female with history of CAD (PCI to LCx 2016, CTO RCA,  70%LAD), severe aortic stenosis, hypertension, forgetfulness who presents due to syncopal episode.  Assessment & Plan    1. Syncope/fall High likelihood of mechanical fall given chronic leg weakness, functional decline over the past year or 2, some memory/dementia issues Would go cautiously with the Klonopin and other sedatives Other differential includes orthostasis versus arrhythmia Less likely orthostasis given blood pressure running high (no orthostatics in the computer) -No arrhythmia on telemetry but would recommend a ZIO monitor at the time of discharge -Severe aortic valve stenosis a concern but less likely the etiology -Would work with PT  2.   Severe AS Moderate stenosis by cardiac catheterization June 2019 Catheterization 2021, severe aortic stenosis with mean gradient of 30 mmHg and valve area of 0.82 square. Felt not to be a TAVR or SAVR candidate as stenosis felt to be more moderate and concern for dementia and continued functional decline  3.   Hypertension -Continue PTA meds  4.  CAD/PCI previous left  circumflex PCI in 2016 with known occluded distal right coronary artery. June 2019 cardiac catheterization which showed patent left circumflex stents without significant restenosis, stable discrete 70% stenosis in the mid LAD and chronically occluded distal RCA with collaterals. -Continue aspirin, Plavix, statin.  5. Depression/anxiety Managed by primary care, some recent changes to her Klonopin, she is unsure what she is taking, other family members to her medications    Total encounter time more than 35 minutes  Greater than 50% was spent in counseling and coordination of care with the patient   For questions or updates, please contact [place] Please consult [email or web address] for contact info under        Signed, Ida Rogue, MD  12/06/2020, 10:59 AM

## 2020-12-06 NOTE — Evaluation (Signed)
Physical Therapy Evaluation Patient Details Name: Amy Morton MRN: [id number] DOB: 08/29/1934 Today's Date: 12/06/2020   History of Present Illness  Patient is an 86 year old female who was found down in the bathroom by her son. Found to have mild T3 compression fracture with no brace recommended per neurosurgery. Cardiology consulsted for syncope with work up ongoing. Past medical history of aortic stenosis, coronary artery disease, anxiety disorder, depression and hypertension.    Clinical Impression  Patient cooperative during evaluation and eager to go home. Orthostatic vitals obtained during session. Supine 186/79, sitting 177/88, standing 0 minutes 143/82, standing 3 minutes 156/91. Patient reports no dizziness with all activity. Had patient ambulate with and without rolling walker. Patient has improved gait pattern with using rolling walker for support and encouraged her to use this for fall prevention as well. Patient is usually independent with all mobility without assistive device. Recommend PT follow up while in the hospital to maximize independence and decrease caregiver burden.       Follow Up Recommendations Home health PT;Supervision - Intermittent    Equipment Recommendations  Rolling walker with 5" wheels    Recommendations for Other Services       Precautions / Restrictions Precautions Precautions: Fall Precaution Comments: allow blood pressure to run >150, <180 per note Required Braces or Orthoses:  (no brace required) Restrictions Weight Bearing Restrictions: No      Mobility  Bed Mobility Overal bed mobility: Needs Assistance Bed Mobility: Supine to Sit     Supine to sit: HOB elevated;Supervision     General bed mobility comments: extra time required to complete tasks. no physical assistance required. no dizziness is reported with sitting upright    Transfers Overall transfer level: Needs assistance Equipment used: Rolling walker (2  wheeled) Transfers: Sit to/from Stand Sit to Stand: Min guard         General transfer comment: Min guard for safety. patient stood from multiple surfaces including bed and toilet. verbal cues for hand placement for safety  Ambulation/Gait Ambulation/Gait assistance: Supervision;Min guard Gait Distance (Feet): 45 Feet Assistive device: Rolling walker (2 wheeled) Gait Pattern/deviations: Step-through pattern;Decreased stride length Gait velocity: decreased   General Gait Details: verbal cues for proper rolling walker use with no loss of balance demonstrated. patient also ambulated ~ 15 ft additionally without rolling walker with narrow base of support and need to reach for furniture for UE support. recommended to use rolling walker for safety, fall prevention, and improved gait pattern  Stairs            Wheelchair Mobility    Modified Rankin (Stroke Patients Only)       Balance Overall balance assessment: History of Falls;Needs assistance Sitting-balance support: Feet supported Sitting balance-Leahy Scale: Good     Standing balance support: Bilateral upper extremity supported Standing balance-Leahy Scale: Fair Standing balance comment: patient needs at least unilateral UE support in standing for safety                             Pertinent Vitals/Pain Pain Assessment: Faces Faces Pain Scale: Hurts a little bit Pain Location: left shoulder with touch (brusing noted). no pain with AROM Pain Descriptors / Indicators: Sore Pain Intervention(s): Monitored during session    Home Living Family/patient expects to be discharged to:: Private residence Living Arrangements: Children Available Help at Discharge: Family;Available PRN/intermittently Type of Home: House Home Access:  (one step up)     Home  Layout: Able to live on main level with bedroom/bathroom Home Equipment: Shower seat;Grab bars - toilet;Walker - standard      Prior Function Level of  Independence: Independent               Hand Dominance        Extremity/Trunk Assessment   Upper Extremity Assessment Upper Extremity Assessment: Overall WFL for tasks assessed    Lower Extremity Assessment Lower Extremity Assessment: Overall WFL for tasks assessed       Communication   Communication: No difficulties  Cognition Arousal/Alertness: Awake/alert Behavior During Therapy: WFL for tasks assessed/performed Overall Cognitive Status: Within Functional Limits for tasks assessed                                        General Comments General comments (skin integrity, edema, etc.): orthostatic vitals taken during session. see vitals sign flowsheet for details. no dizziness is reported with functional mobility    Exercises     Assessment/Plan    PT Assessment Patient needs continued PT services  PT Problem List Decreased activity tolerance;Decreased balance;Decreased mobility;Decreased knowledge of precautions;Decreased safety awareness       PT Treatment Interventions DME instruction;Gait training;Stair training;Therapeutic activities;Functional mobility training;Therapeutic exercise;Balance training;Neuromuscular re-education;Cognitive remediation;Patient/family education    PT Goals (Current goals can be found in the Care Plan section)  Acute Rehab PT Goals Patient Stated Goal: to go home PT Goal Formulation: With patient Time For Goal Achievement: 12/20/20 Potential to Achieve Goals: Good    Frequency Min 2X/week   Barriers to discharge        Co-evaluation               AM-PAC PT "6 Clicks" Mobility  Outcome Measure Help needed turning from your back to your side while in a flat bed without using bedrails?: None Help needed moving from lying on your back to sitting on the side of a flat bed without using bedrails?: A Little Help needed moving to and from a bed to a chair (including a wheelchair)?: A Little Help needed  standing up from a chair using your arms (e.g., wheelchair or bedside chair)?: A Little Help needed to walk in hospital room?: A Little Help needed climbing 3-5 steps with a railing? : A Little 6 Click Score: 19    End of Session   Activity Tolerance: Patient tolerated treatment well Patient left: in chair;with call bell/phone within reach;with nursing/sitter in room Nurse Communication: Mobility status PT Visit Diagnosis: Unsteadiness on feet (R26.81);History of falling (Z91.81)    Time: 0926-1000 PT Time Calculation (min) (ACUTE ONLY): 34 min   Charges:   PT Evaluation $PT Eval Moderate Complexity: 1 Mod PT Treatments $Therapeutic Activity: 8-22 mins        Minna Merritts, PT, MPT   Percell Locus 12/06/2020, 10:56 AM

## 2020-12-06 NOTE — Consult Note (Signed)
Referring Physician:  No referring provider defined for this encounter.  Primary Physician:  [name], MD  Chief Complaint:  Upper back pain  History of Present Illness: 12/06/2020 Amy Morton is a 86 y.o. female who presents with the chief complaint of pain.  She had a fall, which may have been due to syncope. She was found to have a T3 compression fracture.  She has no complaints of new numbness or weakness.  She is not in significant pain currently.  She has a "sore spot" in her upper back.   Review of Systems:  A 10 point review of systems is negative, except for the pertinent positives and negatives detailed in the HPI.  Past Medical History: Past Medical History:  Diagnosis Date  . Abnormal mammogram 2007   Recommend close follow up  . Anemia    Iron, b12, SPEP, UPEP normal, 12/2012  . Anxiety   . Aortic stenosis    Severe by [facility] 05/2020  . aortic stenosis   . Arthritis   . Asthma   . CAD (coronary artery disease)    s/p angioplasty 1996  . Chronic cough   . Depression   . Diverticulosis   . Dyspnea   . Elevated LFTs 2010   s/p ultrasound w/ possible fatty liver; GI consult. Resolution 2011  . Gastric ulcer   . GERD (gastroesophageal reflux disease)   . History of colonic polyps    Dr. Vira Agar  . HOH (hard of hearing)   . Hyperlipidemia   . Hypertension   . Peritoneal carcinomatosis [facility]) 2001   Dr. Oliva Bustard & Dr. Claiborne Rigg s/p chemo  . Shingles 2008   T-9 distribution  . Valvular heart disease    Mild to Moderate MR, AS  . Vitamin D deficiency   . Wheezing     Past Surgical History: Past Surgical History:  Procedure Laterality Date  . ABDOMINAL HYSTERECTOMY  2001  . APPENDECTOMY    . CARDIAC CATHETERIZATION  2007   50% mid LAD stenosis, 70% proximal RCA with 100% distal RCA stenosis with collaterals, EF 65%  . CARDIAC CATHETERIZATION N/A 04/27/2015   Procedure: Left Heart Cath and Coronary Angiography;  Surgeon: Jettie Booze, MD;  Location: [place] [facility];  Service: Cardiovascular;  Laterality: N/A;  . CARDIAC CATHETERIZATION N/A 04/27/2015   Procedure: Coronary Stent Intervention;  Surgeon: Jettie Booze, MD;  Location: [place] [facility];  Service: Cardiovascular;  Laterality: N/A;  . CATARACT EXTRACTION W/PHACO Left 09/25/2016   Procedure: CATARACT EXTRACTION PHACO AND INTRAOCULAR LENS PLACEMENT (IOC);  Surgeon: Eulogio Bear, MD;  Location: [facility];  Service: Ophthalmology;  Laterality: Left;  [place] 46.7 AP% 8.1 CDE 3.80 Fluid pack lot # [id number]  . CATARACT EXTRACTION W/PHACO Right 11/06/2016   Procedure: CATARACT EXTRACTION PHACO AND INTRAOCULAR LENS PLACEMENT ([place]);  Surgeon: Eulogio Bear, MD;  Location: [facility];  Service: Ophthalmology;  Laterality: Right;  [place] 49.5 AP8.7 CDE4.28 LOT # [id number]  . CHOLECYSTECTOMY  1987  . COLECTOMY  2001  . CORONARY ANGIOPLASTY  1996  . CORONARY ANGIOPLASTY    . LAPAROSCOPIC BILATERAL SALPINGO OOPHERECTOMY  1988   Dr. Laverta Baltimore  . LEFT HEART CATH AND CORONARY ANGIOGRAPHY N/A 01/25/2018   Procedure: LEFT HEART CATH AND CORONARY ANGIOGRAPHY;  Surgeon: Wellington Hampshire, MD;  Location: [place] [facility];  Service: Cardiovascular;  Laterality: N/A;  . RIGHT/LEFT HEART CATH AND CORONARY ANGIOGRAPHY N/A 05/16/2020   Procedure: RIGHT/LEFT HEART  CATH AND CORONARY ANGIOGRAPHY;  Surgeon: Wellington Hampshire, MD;  Location: [place] [facility];  Service: Cardiovascular;  Laterality: N/A;  . TONSILLECTOMY  1950    Allergies: Allergies as of 12/05/2020 - Review Complete 12/05/2020  Allergen Reaction Noted  . Abilify [aripiprazole] Other (See Comments) 11/11/2013  . Celexa [citalopram]  06/05/2020  . Ibuprofen Other (See Comments) 08/23/2020    Medications:  Current Facility-Administered Medications:  .  acetaminophen (TYLENOL) tablet 1,000 mg, 1,000 mg, Oral, BID, Agbata, Tochukwu, MD, 1,000 mg at 12/05/20 2100 .  ascorbic acid (VITAMIN C) tablet 500 mg,  500 mg, Oral, Daily, Agbata, Tochukwu, MD .  aspirin EC tablet 81 mg, 81 mg, Oral, Daily, Agbata, Tochukwu, MD .  atorvastatin (LIPITOR) tablet 40 mg, 40 mg, Oral, QHS, Agbata, Tochukwu, MD, 40 mg at 12/05/20 2100 .  cefTRIAXone (ROCEPHIN) 1 g in sodium chloride 0.9 % 100 mL IVPB, 1 g, Intravenous, Q24H, Agbata, Tochukwu, MD, Stopped at 12/05/20 1808 .  cholecalciferol (VITAMIN D3) tablet 5,000 Units, 5,000 Units, Oral, Daily, Agbata, Tochukwu, MD .  clonazePAM (KLONOPIN) tablet 0.5 mg, 0.5 mg, Oral, BID PRN, Agbata, Tochukwu, MD .  clopidogrel (PLAVIX) tablet 75 mg, 75 mg, Oral, Q breakfast, Agbata, Tochukwu, MD .  colestipol (COLESTID) tablet 1 g, 1 g, Oral, Daily, Agbata, Tochukwu, MD .  docusate sodium (COLACE) capsule 100 mg, 100 mg, Oral, BID, Agbata, Tochukwu, MD, 100 mg at 12/05/20 2100 .  DULoxetine (CYMBALTA) DR capsule 60 mg, 60 mg, Oral, Daily, Agbata, Tochukwu, MD .  enoxaparin (LOVENOX) injection 40 mg, 40 mg, Subcutaneous, Q24H, Agbata, Tochukwu, MD, 40 mg at 12/05/20 2100 .  isosorbide mononitrate (IMDUR) 24 hr tablet 45 mg, 45 mg, Oral, Daily, Agbata, Tochukwu, MD, 45 mg at 12/05/20 1731 .  magnesium sulfate IVPB 4 g 100 mL, 4 g, Intravenous, Once, [place], Richard, MD .  metoprolol tartrate (LOPRESSOR) tablet 50 mg, 50 mg, Oral, BID, Agbata, Tochukwu, MD, 50 mg at 12/05/20 2100 .  multivitamin with minerals tablet 1 tablet, 1 tablet, Oral, Daily, Benita Gutter, RPH .  nitroGLYCERIN (NITROSTAT) SL tablet 0.4 mg, 0.4 mg, Sublingual, Q5 min PRN, Agbata, Tochukwu, MD .  omega-3 acid ethyl esters (LOVAZA) capsule 2 g, 2 capsule, Oral, BID, Agbata, Tochukwu, MD, 2 g at 12/05/20 2100 .  ondansetron (ZOFRAN) tablet 4 mg, 4 mg, Oral, Q6H PRN **OR** ondansetron (ZOFRAN) injection 4 mg, 4 mg, Intravenous, Q6H PRN, Agbata, Tochukwu, MD .  pantoprazole (PROTONIX) EC tablet 40 mg, 40 mg, Oral, QAC breakfast, Agbata, Tochukwu, MD .  potassium chloride SA (KLOR-CON) CR tablet 20 mEq, 20 mEq,  Oral, BID, Wieting, Richard, MD .  sodium chloride flush (NS) 0.9 % injection 3 mL, 3 mL, Intravenous, Q12H, Agbata, Tochukwu, MD, 3 mL at 12/05/20 2100 .  traMADol (ULTRAM) tablet 50 mg, 50 mg, Oral, Q6H PRN, Agbata, Tochukwu, MD .  vitamin B-12 (CYANOCOBALAMIN) tablet 250 mcg, 250 mcg, Oral, Daily, Agbata, Tochukwu, MD  Facility-Administered Medications Ordered in Other Encounters:  .  sodium chloride 0.9 % nebulizer solution 3 mL, 3 mL, Nebulization, Once **FOLLOWED BY** methacholine (PROVOCHOLINE) inhaler solution 0.125 mg, 2 mL, Inhalation, Once **FOLLOWED BY** methacholine (PROVOCHOLINE) inhaler solution 0.5 mg, 2 mL, Inhalation, Once **FOLLOWED BY** methacholine (PROVOCHOLINE) inhaler solution 2 mg, 2 mL, Inhalation, Once **FOLLOWED BY** methacholine (PROVOCHOLINE) inhaler solution 8 mg, 2 mL, Inhalation, Once **FOLLOWED BY** methacholine (PROVOCHOLINE) inhaler solution 32 mg, 2 mL, Inhalation, Once **FOLLOWED BY** [COMPLETED] albuterol (PROVENTIL) (2.5 MG/3ML) 0.083% nebulizer solution 2.5 mg,  2.5 mg, Nebulization, Once, [name], MD, 2.5 mg at 08/27/17 0908   Social History: Social History   Tobacco Use  . Smoking status: Never Smoker  . Smokeless tobacco: Never Used  Vaping Use  . Vaping Use: Never used  Substance Use Topics  . Alcohol use: No    Alcohol/week: 0.0 standard drinks  . Drug use: No    Family Medical History: Family History  Problem Relation Age of Onset  . Hypertension Mother   . Stroke Mother 46       Cerebral Hemorrhage  . Cancer Father        bone cancer  . Diabetes Sister   . Diabetes Brother   . Heart disease Brother   . Cancer Daughter 41       Ovarian cancer  . Colon cancer Neg Hx   . Colon polyps Neg Hx     Physical Examination: Vitals:   12/05/20 1948 12/06/20 0345  BP: (!) 187/93 (!) 166/83  Pulse: 86 77  Resp: 18 20  Temp: 97.8 F (36.6 C) 97.8 F (36.6 C)  SpO2: 95% 96%     General: Patient is well developed, well  nourished, calm, collected, and in no apparent distress.  Psychiatric: Patient is non-anxious.  Head:  Pupils equal, round, and reactive to light.  ENT:  Oral mucosa appears well hydrated.  Neck:   Supple.  Full range of motion.  Respiratory: Patient is breathing without any difficulty.  Extremities: No edema.  Vascular: Palpable pulses in dorsal pedal vessels.  Skin:   On exposed skin, there are some minor skin tears.   NEUROLOGICAL:  General: In no acute distress.   Awake, alert, oriented to person, place, and time (with choice).  Pupils equal round and reactive to light.  Facial tone is symmetric.  Tongue protrusion is midline.  There is no pronator drift.  ROM of spine: full.  Palpation of spine: mild tenderness in upper T spine.    Strength: Side Biceps Triceps Deltoid Interossei Grip Wrist Ext. Wrist Flex.  R 5 5 5 5 5 5 5   L 5 5 5 5 5 5 5    Side Iliopsoas Quads Hamstring PF DF EHL  R 5 5 5 5 5 5   L 5 5 5 5 5 5     Bilateral upper and lower extremity sensation is intact to light touch. Reflexes are 1+ and symmetric at the biceps, triceps, brachioradialis, patella and achilles. Hoffman's is absent.  Clonus is not present.  Toes are down-going.    Gait is untested.   Imaging: Ct T spine 12/05/20 IMPRESSION: 1. Interval mild superior endplate compression deformity at T3, likely acute. No osseous retropulsion or signs of dynamic instability. 2. No other acute osseous findings. 3. Mild thoracic spondylosis. 4. Additional chest CT findings dictated separately.   Electronically Signed   By: Richardean Sale M.D.   On: 12/05/2020 10:43  I have personally reviewed the images and agree with the above interpretation.  Labs: CBC Latest Ref Rng & Units 12/06/2020 12/05/2020 09/11/2020  WBC 4.0 - 10.5 K/uL 8.6 14.5(H) 8.5  Hemoglobin 12.0 - 15.0 g/dL 12.6 13.3 11.3(L)  Hematocrit 36.0 - 46.0 % 35.2(L) 37.2 32.4(L)  Platelets 150 - 400 K/uL 207 192 198        Assessment and Plan: Ms. Melendrez is a pleasant 86 y.o. female with mild T3 compression fracture.  She has no neurologic deficits.  - No brace necessary - No intervention - OK to  mobilize with PT - Other care per primary and other consulting services  [place] K. Izora Ribas MD, [place] Dept. of Neurosurgery

## 2020-12-06 NOTE — TOC Transition Note (Signed)
Transition of Care [facility]) - CM/SW Discharge Note   Patient Details  Name: AJANEE BUREN MRN: [id number] Date of Birth: 1935/02/16  Transition of [facility] CM/SW Contact:  Eileen Stanford, LCSW Phone Number: 12/06/2020, 3:44 PM   Clinical Narrative:   [place] arranged through [employer] to deliver walker to bedside.    Final next level of care: Home w Home Health Services Barriers to Discharge: No Barriers Identified   Patient Goals and CMS Choice     Choice offered to / list presented to : Patient  Discharge Placement                    Patient and family notified of of transfer: 12/06/20  Discharge Plan and Services In-house Referral: Clinical Social Work   Post Acute Care Choice: Home Health          DME Arranged: Gilford Rile DME Agency: [employer] Date DME Agency Contacted: 12/06/20 Time DME Agency Contacted: [phone] Representative spoke with at DME Agency: Suanne Marker [place]: PT,OT [place]: [place] ([place]) Date [place]: 12/06/20 Time [place]: [place] Representative spoke with at [place]: [place] ([place]) Interventions     Readmission Risk Interventions No flowsheet data found.

## 2020-12-06 NOTE — Progress Notes (Signed)
Monitor ordered for syncope and collapse.  Will contact RT.

## 2020-12-07 ENCOUNTER — Telehealth: Payer: Self-pay

## 2020-12-07 DIAGNOSIS — R55 Syncope and collapse: Secondary | ICD-10-CM | POA: Diagnosis not present

## 2020-12-07 LAB — URINE CULTURE: Culture: >=100000 — AB

## 2020-12-07 NOTE — Telephone Encounter (Signed)
Spoke with caretaker [name], HIPAA compliant) regarding need for hospital follow up per discharge note. Patient is scheduled with Neurosurgery and Cardiology at this time. Declines appointment offered with PCP. Agrees to notify physician if condition worsens and as needed. Notes per Cardiology klonopin is currently being tapered. Patient was originally taking 0.5 BID, now taking 0.5 daily for 1 week, then 0.5 every other day, then stop. Caretaker requests confirmation this tapered schedule is okay to continue. Caretaker also notifies nurse she canceled lab appointment scheduled at [facility] next week and anticipates Cardiology will follow.

## 2020-12-07 NOTE — Telephone Encounter (Signed)
That taper regimen is ok.

## 2020-12-08 DIAGNOSIS — K589 Irritable bowel syndrome without diarrhea: Secondary | ICD-10-CM | POA: Diagnosis not present

## 2020-12-08 DIAGNOSIS — F419 Anxiety disorder, unspecified: Secondary | ICD-10-CM | POA: Diagnosis not present

## 2020-12-08 DIAGNOSIS — D519 Vitamin B12 deficiency anemia, unspecified: Secondary | ICD-10-CM | POA: Diagnosis not present

## 2020-12-08 DIAGNOSIS — R918 Other nonspecific abnormal finding of lung field: Secondary | ICD-10-CM | POA: Diagnosis not present

## 2020-12-08 DIAGNOSIS — D509 Iron deficiency anemia, unspecified: Secondary | ICD-10-CM | POA: Diagnosis not present

## 2020-12-08 DIAGNOSIS — F32A Depression, unspecified: Secondary | ICD-10-CM | POA: Diagnosis not present

## 2020-12-08 DIAGNOSIS — M8448XD Pathological fracture, other site, subsequent encounter for fracture with routine healing: Secondary | ICD-10-CM | POA: Diagnosis not present

## 2020-12-08 DIAGNOSIS — I10 Essential (primary) hypertension: Secondary | ICD-10-CM | POA: Diagnosis not present

## 2020-12-08 DIAGNOSIS — E871 Hypo-osmolality and hyponatremia: Secondary | ICD-10-CM | POA: Diagnosis not present

## 2020-12-08 DIAGNOSIS — H9190 Unspecified hearing loss, unspecified ear: Secondary | ICD-10-CM | POA: Diagnosis not present

## 2020-12-08 DIAGNOSIS — I2510 Atherosclerotic heart disease of native coronary artery without angina pectoris: Secondary | ICD-10-CM | POA: Diagnosis not present

## 2020-12-08 DIAGNOSIS — R053 Chronic cough: Secondary | ICD-10-CM | POA: Diagnosis not present

## 2020-12-08 DIAGNOSIS — B9620 Unspecified Escherichia coli [E. coli] as the cause of diseases classified elsewhere: Secondary | ICD-10-CM | POA: Diagnosis not present

## 2020-12-08 DIAGNOSIS — K259 Gastric ulcer, unspecified as acute or chronic, without hemorrhage or perforation: Secondary | ICD-10-CM | POA: Diagnosis not present

## 2020-12-08 DIAGNOSIS — M1990 Unspecified osteoarthritis, unspecified site: Secondary | ICD-10-CM | POA: Diagnosis not present

## 2020-12-08 DIAGNOSIS — I080 Rheumatic disorders of both mitral and aortic valves: Secondary | ICD-10-CM | POA: Diagnosis not present

## 2020-12-08 DIAGNOSIS — K5790 Diverticulosis of intestine, part unspecified, without perforation or abscess without bleeding: Secondary | ICD-10-CM | POA: Diagnosis not present

## 2020-12-08 DIAGNOSIS — E559 Vitamin D deficiency, unspecified: Secondary | ICD-10-CM | POA: Diagnosis not present

## 2020-12-08 DIAGNOSIS — M47812 Spondylosis without myelopathy or radiculopathy, cervical region: Secondary | ICD-10-CM | POA: Diagnosis not present

## 2020-12-08 DIAGNOSIS — J45909 Unspecified asthma, uncomplicated: Secondary | ICD-10-CM | POA: Diagnosis not present

## 2020-12-08 DIAGNOSIS — K219 Gastro-esophageal reflux disease without esophagitis: Secondary | ICD-10-CM | POA: Diagnosis not present

## 2020-12-08 DIAGNOSIS — N3001 Acute cystitis with hematuria: Secondary | ICD-10-CM | POA: Diagnosis not present

## 2020-12-08 DIAGNOSIS — E876 Hypokalemia: Secondary | ICD-10-CM | POA: Diagnosis not present

## 2020-12-08 DIAGNOSIS — E785 Hyperlipidemia, unspecified: Secondary | ICD-10-CM | POA: Diagnosis not present

## 2020-12-10 DIAGNOSIS — I2510 Atherosclerotic heart disease of native coronary artery without angina pectoris: Secondary | ICD-10-CM | POA: Diagnosis not present

## 2020-12-10 DIAGNOSIS — N3001 Acute cystitis with hematuria: Secondary | ICD-10-CM | POA: Diagnosis not present

## 2020-12-10 DIAGNOSIS — B9620 Unspecified Escherichia coli [E. coli] as the cause of diseases classified elsewhere: Secondary | ICD-10-CM | POA: Diagnosis not present

## 2020-12-10 DIAGNOSIS — I10 Essential (primary) hypertension: Secondary | ICD-10-CM | POA: Diagnosis not present

## 2020-12-10 DIAGNOSIS — M8448XD Pathological fracture, other site, subsequent encounter for fracture with routine healing: Secondary | ICD-10-CM | POA: Diagnosis not present

## 2020-12-10 DIAGNOSIS — I080 Rheumatic disorders of both mitral and aortic valves: Secondary | ICD-10-CM | POA: Diagnosis not present

## 2020-12-10 NOTE — Telephone Encounter (Signed)
Patient caretaker made aware. No further questions or concerns.

## 2020-12-11 ENCOUNTER — Other Ambulatory Visit: Payer: Medicare Other

## 2020-12-12 ENCOUNTER — Other Ambulatory Visit: Payer: Self-pay

## 2020-12-12 ENCOUNTER — Ambulatory Visit (INDEPENDENT_AMBULATORY_CARE_PROVIDER_SITE_OTHER): Payer: Medicare Other | Admitting: Nurse Practitioner

## 2020-12-12 ENCOUNTER — Encounter: Payer: Self-pay | Admitting: Nurse Practitioner

## 2020-12-12 VITALS — BP 158/60 | HR 69 | Ht 59.0 in | Wt 122.5 lb

## 2020-12-12 DIAGNOSIS — I10 Essential (primary) hypertension: Secondary | ICD-10-CM

## 2020-12-12 DIAGNOSIS — R55 Syncope and collapse: Secondary | ICD-10-CM | POA: Diagnosis not present

## 2020-12-12 DIAGNOSIS — E785 Hyperlipidemia, unspecified: Secondary | ICD-10-CM

## 2020-12-12 DIAGNOSIS — I350 Nonrheumatic aortic (valve) stenosis: Secondary | ICD-10-CM | POA: Diagnosis not present

## 2020-12-12 DIAGNOSIS — I2510 Atherosclerotic heart disease of native coronary artery without angina pectoris: Secondary | ICD-10-CM | POA: Diagnosis not present

## 2020-12-12 DIAGNOSIS — I25118 Atherosclerotic heart disease of native coronary artery with other forms of angina pectoris: Secondary | ICD-10-CM | POA: Diagnosis not present

## 2020-12-12 NOTE — Progress Notes (Signed)
Office Visit    Patient Name: Amy Morton Date of Encounter: 12/12/2020  Primary Care Provider:  [name], MD Primary Cardiologist:  Kathlyn Sacramento, MD  Chief Complaint    86 year old female with a history of CAD, severe aortic stenosis, hypertension, hyperlipidemia, syncope, PSVT, HFpEF, aortic insufficiency, anxiety, depression, anemia, and GERD, presents for follow-up related to recent hospitalization for syncope.  Past Medical History    Past Medical History:  Diagnosis Date  . (HFpEF) heart failure with preserved ejection fraction ([place])    a. 08/2020 PET: Not suggestive of transthyretin amyloidosis; b. 11/2020 Echo: 60-65%, gr1 DD.  Marland Kitchen Abnormal mammogram 2007   Recommend close follow up  . Anemia    Iron, b12, SPEP, UPEP normal, 12/2012  . Anxiety   . Aortic insufficiency    a. 06/2020 cMRI: mild AI.  Marland Kitchen Arthritis   . Asthma   . CAD (coronary artery disease)    a. 1996 s/p PTCA; b. 06/2011 Cath: LCX 50, RCA 100 CTO; c. 03/2015 PCI: LAD 70, LCX 34m w/ abnl FFR (3.0x12 Synergy & 2.5x8 Synergy DESs); d. 01/2018 Cath: stable anatomy, patent LCX stents; e. 04/2020 Cath: LM nl, LAD 40p, 48m, D2 70ost, LCX 40ost, patent stents, RCA 70p/m, 100d. RPAV fills via R->R collats. RPL2 fills via L->R collats-->Med Rx.  . Carotid arterial disease ([employer])    a. 05/2020 Carotid U/S: <50% bilat ICA stenoses.  . Chronic cough   . Depression   . Diverticulosis   . Dyspnea   . Elevated LFTs 2010   s/p ultrasound w/ possible fatty liver; GI consult. Resolution 2011  . Gastric ulcer   . GERD (gastroesophageal reflux disease)   . History of colonic polyps    Dr. Vira Agar  . HOH (hard of hearing)   . Hyperlipidemia   . Hypertension   . Peritoneal carcinomatosis [facility]) 2001   Dr. Oliva Bustard & Dr. Claiborne Rigg s/p chemo  . PSVT (paroxysmal supraventricular tachycardia) ([place])    a. 06/2020 Zio: 52 episodes of SVT (longest 59 secs; fastest 159 bpm).  . Severe aortic stenosis    a.  05/2020 Cor CTA: Tricuspid AoV w/ sev reduced cusp separation and Ca2+; b. 06/2020 cMRI: Sev AS w/ mild AI. Mod MR. EF 69% w/ sev asymmetric LVH. Nl RV fxn; c. 11/2020 Echo: EF 60-65%, no rwma, Gr1 DD. Nl RV size/fxn. Mild MR. Mod-Sev AS (appears severe visually) AVA 0.94cm^2.  . Shingles 2008   T-9 distribution  . Syncope    a. 06/2020 Zio: 7 beats WCT (NSVT). 52 episodes of SVT (longest 59 secs, fastest 159 bpm).  . Vitamin D deficiency   . Wheezing    Past Surgical History:  Procedure Laterality Date  . ABDOMINAL HYSTERECTOMY  2001  . APPENDECTOMY    . CARDIAC CATHETERIZATION  2007   50% mid LAD stenosis, 70% proximal RCA with 100% distal RCA stenosis with collaterals, EF 65%  . CARDIAC CATHETERIZATION N/A 04/27/2015   Procedure: Left Heart Cath and Coronary Angiography;  Surgeon: Jettie Booze, MD;  Location: [place] [facility];  Service: Cardiovascular;  Laterality: N/A;  . CARDIAC CATHETERIZATION N/A 04/27/2015   Procedure: Coronary Stent Intervention;  Surgeon: Jettie Booze, MD;  Location: [place] [facility];  Service: Cardiovascular;  Laterality: N/A;  . CATARACT EXTRACTION W/PHACO Left 09/25/2016   Procedure: CATARACT EXTRACTION PHACO AND INTRAOCULAR LENS PLACEMENT (IOC);  Surgeon: Eulogio Bear, MD;  Location: [facility];  Service: Ophthalmology;  Laterality: Left;  [place] 46.7 AP% 8.1 CDE 3.80 Fluid pack lot # [id number]  . CATARACT EXTRACTION W/PHACO Right 11/06/2016   Procedure: CATARACT EXTRACTION PHACO AND INTRAOCULAR LENS PLACEMENT (IOC);  Surgeon: Eulogio Bear, MD;  Location: [facility];  Service: Ophthalmology;  Laterality: Right;  [place] 49.5 AP8.7 CDE4.28 LOT # [id number]  . CHOLECYSTECTOMY  1987  . COLECTOMY  2001  . CORONARY ANGIOPLASTY  1996  . CORONARY ANGIOPLASTY    . LAPAROSCOPIC BILATERAL SALPINGO OOPHERECTOMY  1988   Dr. Laverta Baltimore  . LEFT HEART CATH AND CORONARY ANGIOGRAPHY N/A 01/25/2018   Procedure: LEFT HEART CATH AND CORONARY ANGIOGRAPHY;  Surgeon: Wellington Hampshire, MD;  Location: [place] [facility];  Service: Cardiovascular;  Laterality: N/A;  . RIGHT/LEFT HEART CATH AND CORONARY ANGIOGRAPHY N/A 05/16/2020   Procedure: RIGHT/LEFT HEART CATH AND CORONARY ANGIOGRAPHY;  Surgeon: Wellington Hampshire, MD;  Location: [place] [facility];  Service: Cardiovascular;  Laterality: N/A;  . TONSILLECTOMY  1950    Allergies  Allergies  Allergen Reactions  . Abilify [Aripiprazole] Other (See Comments)    Burning in Chest Elevated b/p Insomnia  . Celexa [Citalopram]     Caused low sodium level.   . Ibuprofen Other (See Comments)    GIB    History of Present Illness    86 year old female with above complex past medical history including coronary artery disease, severe aortic stenosis, aortic insufficiency, hypertension, hyperlipidemia, syncope, PSVT, HFpEF, anxiety, depression, anemia, and GERD.  Cardiac history dates back to 1996, at which time she underwent PTCA.  Catheterization in 2012 showed a chronic total occlusion of the right coronary artery.  Subsequent evaluation in September 2016 showed a 70% stenosis in the LAD as well as a 70% stenosis in the left circumflex with abnormal FFR.  The circumflex was successfully treated with 2 drug-eluting stents.  Catheterizations in 2019 and October 2021 showed stable anatomy.  October 2021 catheterization also showed low filling pressures, normal pulmonary pressure, normal cardiac output, and severe aortic stenosis with a mean gradient of 30 mmHg and valve area of 0.82 cm.  Unfortunately, November 2021, she was hospitalized with TIA.  In the setting of severe aortic stenosis, she was referred to structural heart clinic.  CT angiography of the chest, abdomen and pelvis, showed anatomy suitable for TAVR.  Cardiac MRI in December 2022 showed severe aortic stenosis with mild aortic insufficiency and moderate MR. PYP scan was performed in February 2022 and was not consistent with TTR amyloid.  Her case was reviewed by  the multidisciplinary heart valve team on February 8, and felt that overall, aortic stenosis was moderate.  They also had concerns about the patient's memory loss and long-term prognosis.  Ms. Belcher was last seen in cardiology clinic in April.  At that time, she was noted to continue to struggle with anxiety, memory decline, and weight loss (-15 pounds over 6 months).  With decline on multiple fronts, it was agreed that she was a poor candidate for TAVR.  In early May, she was admitted to [facility] following a syncopal episode that occurred at home.  It was felt that there was a high likelihood of mechanical fall.  She was not orthostatic and no arrhythmias were noted on telemetry.  Echocardiogram showed an EF of 60-65% without regional wall motion abnormalities.  Moderate to severe AS was noted.  During hospitalization, she was noted to be hypokalemic and hypomagnesemic  both requiring supplementation @ discharge. A Zio monitor was placed at discharge and to  date, has not shown any significant arrhythmias.  Since her hospital stay, she has done well.  Her son stays w/ her and she has been receiving PT and also continues to use a walker.  She notes ongoing unsteadiness on her feet but denies any falls, presyncope, syncope, dyspnea, or chest pain.  She and family member present w/ her today express understanding of her Aortic Stenosis diagnosis, plan for conservative mgmt, and expected course.  Home Medications    Prior to Admission medications   Medication Sig Start Date End Date Taking? Authorizing Provider  acetaminophen (TYLENOL) 500 MG tablet Take 1,000 mg by mouth in the morning and at bedtime.    [provider]  Ascorbic Acid (VITAMIN C PO) Take by mouth.    [provider]  aspirin 81 MG tablet Take 81 mg by mouth daily.    [provider]  atorvastatin (LIPITOR) 40 MG tablet Take 1 tablet (40 mg total) by mouth daily. 07/18/20 07/13/21  Loel Dubonnet, NP   Cholecalciferol 5000 units TABS Take 5,000 Units by mouth daily.    [provider]  clonazePAM (KLONOPIN) 0.5 MG tablet TAKE 1 TABLET(0.5 MG) BY MOUTH TWICE DAILY AS NEEDED FOR ANXIETY 11/22/20   [name], MD  clopidogrel (PLAVIX) 75 MG tablet TAKE 1 TABLET BY MOUTH DAILY WITH BREAKFAST 12/03/20   Wellington Hampshire, MD  colestipol (COLESTID) 1 g tablet TAKE 1 TABLET(1 GRAM) BY MOUTH DAILY 11/07/20   [name], MD  DULoxetine (CYMBALTA) 30 MG capsule Take 1 capsule (30 mg total) by mouth daily for 14 days, THEN 2 capsules (60 mg total) daily. 11/22/20 02/20/21  [name], MD  isosorbide mononitrate (IMDUR) 30 MG 24 hr tablet TAKE 1 AND 1/2 TABLETS(45 MG) BY MOUTH DAILY 12/03/20   Wellington Hampshire, MD  Magnesium Oxide 400 MG CAPS Take 1 capsule (400 mg total) by mouth daily. 12/06/20   Loletha Grayer, MD  metoprolol tartrate (LOPRESSOR) 50 MG tablet TAKE 1 TABLET(50 MG) BY MOUTH TWICE DAILY 12/03/20   Wellington Hampshire, MD  Multiple Vitamin (MULTIVITAMIN) capsule Take 1 capsule by mouth daily.    [provider]  nitroGLYCERIN (NITROSTAT) 0.4 MG SL tablet PLACE 1 TABLET UNDER THE TONGUE EVERY 5 MINUTES AS NEEDED FOR CHEST PAIN, 3 DOSES MAX 11/24/19   Wellington Hampshire, MD  Nutritional Supplements (OSTEO ADVANCE) TABS Take 1 tablet by mouth daily.     [provider]  omega-3 acid ethyl esters (LOVAZA) 1 g capsule TAKE 2 CAPSULES BY MOUTH TWICE DAILY 06/11/20   Einar Pheasant, MD  pantoprazole (PROTONIX) 40 MG tablet TAKE 1 TABLET(40 MG) BY MOUTH DAILY 12/03/20   Wellington Hampshire, MD  potassium chloride SA (KLOR-CON) 20 MEQ tablet Take 1 tablet (20 mEq total) by mouth daily. 12/06/20   Loletha Grayer, MD  Ubiquinol 100 MG CAPS Take 100 mg by mouth every morning.    [provider]  vitamin B-12 (CYANOCOBALAMIN) 250 MCG tablet Take 250 mcg by mouth daily.    [provider]    Review of Systems    Unsteadiness on her feet.  She  denies chest pain, palpitations, dyspnea, pnd, orthopnea, n, v, dizziness, syncope, edema, weight gain, or early satiety.  All other systems reviewed and are otherwise negative except as noted above.  Physical Exam    VS:  BP (!) 158/60 (BP Location: Left Arm, Patient Position: Sitting, Cuff Size: Normal)   Pulse 69  Ht 4\' 11"  (1.499 m)   Wt 122 lb 8 oz (55.6 kg)   SpO2 96%   BMI 24.74 kg/m  , BMI Body mass index is 24.74 kg/m.  Orthostatic VS for the past 24 hrs:  BP- Lying Pulse- Lying BP- Sitting Pulse- Sitting BP- Standing at 0 minutes Pulse- Standing at 0 minutes  12/12/20 1444 149/75 66 148/75 69 163/73 73   GEN: Well nourished, well developed, in no acute distress. HEENT: normal. Neck: Supple, no JVD, carotid bruits, or masses. Cardiac: RRR, 3/6 SEM @ the upper sternal borders, but heard throughout.  Audible S2.  No rubs, or gallops. No clubbing, cyanosis, edema.  Radials/PT 2+ and equal bilaterally.  Respiratory:  Respirations regular and unlabored, clear to auscultation bilaterally. GI: Soft, nontender, nondistended, BS + x 4. MS: no deformity or atrophy. Skin: warm and dry, no rash. Neuro:  Strength and sensation are intact. Psych: Normal affect.  Accessory Clinical Findings    ECG personally reviewed by me today - RSR, 69, LAD, LVH - no acute changes.  Lab Results  Component Value Date   WBC 8.6 12/06/2020   HGB 12.6 12/06/2020   HCT 35.2 (L) 12/06/2020   MCV 89.8 12/06/2020   PLT 207 12/06/2020   Lab Results  Component Value Date   CREATININE 0.61 12/06/2020   BUN 12 12/06/2020   NA 132 (L) 12/06/2020   K 3.2 (L) 12/06/2020   CL 94 (L) 12/06/2020   CO2 24 12/06/2020   Lab Results  Component Value Date   ALT 21 12/05/2020   AST 35 12/05/2020   ALKPHOS 44 12/05/2020   BILITOT 1.0 12/05/2020   Lab Results  Component Value Date   CHOL 95 06/06/2020   HDL 41 06/06/2020   LDLCALC 40 06/06/2020   LDLDIRECT 35.0 02/21/2019   TRIG 68 06/06/2020    CHOLHDL 2.3 06/06/2020    Lab Results  Component Value Date   HGBA1C 6.0 (H) 06/06/2020    Assessment & Plan    1.  Syncope: Patient was recently hospitalized at [facility] following a syncopal episode with unclear etiology.  It was not felt that aortic stenosis was playing a role and instead was believe that the most likely scenario was a mechanical fall with subsequent loss of consciousness.  She has not had any recurrent presyncope or syncope.  She is not orthostatic by exam today.  She is currently wearing a Zio monitor and to date, she has not had any significant arrhythmias.  Continue to follow monitoring to completion, which will occur next week.  2.  Coronary artery disease: Diagnostic catheterization October 2021 showed stable coronary anatomy with chronically occluded RCA and left-to-right collaterals.  She also has patent left circumflex stents.  She has not been having any chest pain or dyspnea and remains on aspirin, statin, Plavix, beta-blocker, and nitrate therapy.  3.  Moderate to severe aortic stenosis: Previous evaluated by structural heart team in multidisciplinary structural clinic and not felt to be a candidate for TAVR given gradual memory decline, severe anxiety, weight loss, and poor functional capacity.  Patient and family understand this.  4.  Essential hypertension: Blood pressure 158/60, which is reasonable in the setting of underlying aortic stenosis.  Goal systolic [id number] mmHg.  No changes today.  5.  Hyperlipidemia: She remains on high potency statin therapy with an LDL of 40 last November.  LFTs were normal last week.  6.  Memory decline: Family member notes significant decline over the past 6  months though patient is lucid today.  7.  Hypomagnesemia/hypokalemia: Electrolyte abnormalities noted during hospitalization and she is currently on supplementation.  Follow-up basic metabolic panel and magnesium today.    8.  Disposition: Follow-up Zio monitoring to completion.   Follow-up in office in 3 months or sooner if necessary.  Murray Hodgkins, NP 12/12/2020, 4:00 PM

## 2020-12-12 NOTE — Patient Instructions (Signed)
Medication Instructions:  Your physician recommends that you continue on your current medications as directed. Please refer to the Current Medication list given to you today.  *If you need a refill on your cardiac medications before your next appointment, please call your pharmacy*   Lab Work:  BMET and Magnesium to be drawn today.  If you have labs (blood work) drawn today and your tests are completely normal, you will receive your results only by: Marland Kitchen MyChart Message (if you have MyChart) OR . A paper copy in the mail If you have any lab test that is abnormal or we need to change your treatment, we will call you to review the results.   Testing/Procedures: None ordered   Follow-Up: At [facility], you and your health needs are our priority.  As part of our continuing mission to provide you with exceptional heart care, we have created designated Provider Care Teams.  These Care Teams include your primary Cardiologist (physician) and Advanced Practice Providers (APPs -  Physician Assistants and Nurse Practitioners) who all work together to provide you with the care you need, when you need it.  We recommend signing up for the patient portal called "MyChart".  Sign up information is provided on this After Visit Summary.  MyChart is used to connect with patients for Virtual Visits (Telemedicine).  Patients are able to view lab/test results, encounter notes, upcoming appointments, etc.  Non-urgent messages can be sent to your provider as well.   To learn more about what you can do with MyChart, go to [email or web address].    Your next appointment:   2 month(s)  The format for your next appointment:   In Person  Provider:   You may see Kathlyn Sacramento, MD or one of the following Advanced Practice Providers on your designated Care Team:    Murray Hodgkins, NP  Christell Faith, PA-C  Marrianne Mood, PA-C  Cadence [place], [place]  [name], NP    Other Instructions

## 2020-12-13 ENCOUNTER — Other Ambulatory Visit: Payer: Self-pay

## 2020-12-13 ENCOUNTER — Other Ambulatory Visit: Payer: Self-pay | Admitting: Internal Medicine

## 2020-12-13 ENCOUNTER — Telehealth: Payer: Self-pay | Admitting: Family Medicine

## 2020-12-13 ENCOUNTER — Other Ambulatory Visit: Payer: Self-pay | Admitting: *Deleted

## 2020-12-13 DIAGNOSIS — B9620 Unspecified Escherichia coli [E. coli] as the cause of diseases classified elsewhere: Secondary | ICD-10-CM | POA: Diagnosis not present

## 2020-12-13 DIAGNOSIS — I25118 Atherosclerotic heart disease of native coronary artery with other forms of angina pectoris: Secondary | ICD-10-CM

## 2020-12-13 DIAGNOSIS — I2510 Atherosclerotic heart disease of native coronary artery without angina pectoris: Secondary | ICD-10-CM | POA: Diagnosis not present

## 2020-12-13 DIAGNOSIS — I080 Rheumatic disorders of both mitral and aortic valves: Secondary | ICD-10-CM | POA: Diagnosis not present

## 2020-12-13 DIAGNOSIS — M8448XD Pathological fracture, other site, subsequent encounter for fracture with routine healing: Secondary | ICD-10-CM | POA: Diagnosis not present

## 2020-12-13 DIAGNOSIS — I350 Nonrheumatic aortic (valve) stenosis: Secondary | ICD-10-CM

## 2020-12-13 DIAGNOSIS — N3001 Acute cystitis with hematuria: Secondary | ICD-10-CM | POA: Diagnosis not present

## 2020-12-13 DIAGNOSIS — R55 Syncope and collapse: Secondary | ICD-10-CM

## 2020-12-13 DIAGNOSIS — I10 Essential (primary) hypertension: Secondary | ICD-10-CM | POA: Diagnosis not present

## 2020-12-13 LAB — BASIC METABOLIC PANEL
BUN/Creatinine Ratio: 17 (ref 12–28)
BUN: 12 mg/dL (ref 8–27)
CO2: 23 mmol/L (ref 20–29)
Calcium: 9.5 mg/dL (ref 8.7–10.3)
Chloride: 89 mmol/L — ABNORMAL LOW (ref 96–106)
Creatinine, Ser: 0.71 mg/dL (ref 0.57–1.00)
Glucose: 104 mg/dL — ABNORMAL HIGH (ref 65–99)
Potassium: 4.3 mmol/L (ref 3.5–5.2)
Sodium: 128 mmol/L — ABNORMAL LOW (ref 134–144)
eGFR: 84 mL/min/{1.73_m2} (ref >59)

## 2020-12-13 LAB — MAGNESIUM: Magnesium: 1.4 mg/dL — ABNORMAL LOW (ref 1.6–2.3)

## 2020-12-13 MED ORDER — POTASSIUM CHLORIDE CRYS ER 20 MEQ PO TBCR: 20.0000 meq | EXTENDED_RELEASE_TABLET | Freq: Every day | ORAL | 3 refills | Status: DC

## 2020-12-13 MED ORDER — MAGNESIUM-OXIDE 400 (240 MG) MG PO TABS: 1.0000 | ORAL_TABLET | Freq: Two times a day (BID) | ORAL | 3 refills | Status: DC

## 2020-12-13 NOTE — Telephone Encounter (Signed)
Who is prescribing cymbalta ? It likely needs to be reduced to 1/2 and consider taper off  She needs appt with PCP or provider asap to manage this

## 2020-12-13 NOTE — Telephone Encounter (Signed)
I called and spoke with the sister of the patient and the patient for last 3 days has been taking 2 tablets a day, I informed her  per Dr. Aundra Dubin- Jacklynn Lewis to drop to 1 tablet until Sunday and then 1/2 tablet for 1 week  then  stop altogether until she can see the provider at her next visit in June. She understood.     Shon Indelicato,cma

## 2020-12-13 NOTE — Telephone Encounter (Signed)
Amy Morton was calling to deliver info to the [place] in regards to lab results done for PT

## 2020-12-13 NOTE — Telephone Encounter (Signed)
I received a call from the patients caregiver and her Cymbalta was increased on 5/15  and when patient was in the hospital on 5/12 her sodium level was 132 and after the increase on 5/18 the patient's sodium dropped to 128 please advise.  Haik Mahoney,cma

## 2020-12-17 ENCOUNTER — Telehealth: Payer: Self-pay | Admitting: Family Medicine

## 2020-12-17 DIAGNOSIS — M8448XD Pathological fracture, other site, subsequent encounter for fracture with routine healing: Secondary | ICD-10-CM | POA: Diagnosis not present

## 2020-12-17 DIAGNOSIS — I2510 Atherosclerotic heart disease of native coronary artery without angina pectoris: Secondary | ICD-10-CM | POA: Diagnosis not present

## 2020-12-17 DIAGNOSIS — I10 Essential (primary) hypertension: Secondary | ICD-10-CM | POA: Diagnosis not present

## 2020-12-17 DIAGNOSIS — N3001 Acute cystitis with hematuria: Secondary | ICD-10-CM | POA: Diagnosis not present

## 2020-12-17 DIAGNOSIS — B9620 Unspecified Escherichia coli [E. coli] as the cause of diseases classified elsewhere: Secondary | ICD-10-CM | POA: Diagnosis not present

## 2020-12-17 DIAGNOSIS — I080 Rheumatic disorders of both mitral and aortic valves: Secondary | ICD-10-CM | POA: Diagnosis not present

## 2020-12-17 NOTE — Telephone Encounter (Signed)
[facility] called in to let Dr.Sonnenberg know that the patient had a fall on Saturday and the nurse put a badge on it

## 2020-12-18 NOTE — Telephone Encounter (Signed)
I called to check on the patient and the sister stated she fell over a rock and her arm hit a brick and she has a superficial cut on her arm its not bad at all they have been cleaning it and dressing it and the sister stated she spent the night with the patient to make sure she was okay and she is doing just fine.  [place],cma

## 2020-12-18 NOTE — Telephone Encounter (Signed)
Noted. If they have any issues with the wound or if it starts to look infected they should contact us for an appointment.

## 2020-12-19 DIAGNOSIS — I080 Rheumatic disorders of both mitral and aortic valves: Secondary | ICD-10-CM | POA: Diagnosis not present

## 2020-12-19 DIAGNOSIS — B9620 Unspecified Escherichia coli [E. coli] as the cause of diseases classified elsewhere: Secondary | ICD-10-CM | POA: Diagnosis not present

## 2020-12-19 DIAGNOSIS — M8448XD Pathological fracture, other site, subsequent encounter for fracture with routine healing: Secondary | ICD-10-CM | POA: Diagnosis not present

## 2020-12-19 DIAGNOSIS — N3001 Acute cystitis with hematuria: Secondary | ICD-10-CM | POA: Diagnosis not present

## 2020-12-19 DIAGNOSIS — I10 Essential (primary) hypertension: Secondary | ICD-10-CM | POA: Diagnosis not present

## 2020-12-19 DIAGNOSIS — I2510 Atherosclerotic heart disease of native coronary artery without angina pectoris: Secondary | ICD-10-CM | POA: Diagnosis not present

## 2020-12-20 ENCOUNTER — Telehealth: Payer: Self-pay | Admitting: Family Medicine

## 2020-12-20 NOTE — Telephone Encounter (Signed)
[name] called and want to know if patient could go back on her Mirtazapine 30 mg . Patient is depressed, teary eye, and nervous. She has had some family issues going on.

## 2020-12-21 MED ORDER — TRAZODONE HCL 50 MG PO TABS: 25.0000 mg | ORAL_TABLET | Freq: Every evening | ORAL | 3 refills | Status: DC | PRN

## 2020-12-21 NOTE — Telephone Encounter (Signed)
Patient finishes the cymbalta today. The Remeron doesn't help with the depression but it helped with the sleep. Patient has not been sleeping well and is making her sx worse per wanda. She would like the rx filked sue to that reason alone.

## 2020-12-21 NOTE — Telephone Encounter (Signed)
Trazodone sent to pharmacy

## 2020-12-21 NOTE — Telephone Encounter (Signed)
She really needs to be on something to help with depression as that is the source of her sleep issues. Given the difficulty I have had finding a medication that works for her without side effects I think she needs to see psychiatry for further treatment. I would suggest trying trazadone to see if that will help with her sleep. I would not restart the remeron as it was not beneficial for the depression and could interfere with the psychiatrist starting something new.

## 2020-12-21 NOTE — Telephone Encounter (Signed)
Was informed she is ok with trazodone. Informed that patient doesn't want to go to psychiatry.

## 2020-12-21 NOTE — Telephone Encounter (Signed)
Is she currently off of the cymbalta? Based on prior discussions I thought the remeron was not beneficial for her. Is that correct?

## 2020-12-26 DIAGNOSIS — N3001 Acute cystitis with hematuria: Secondary | ICD-10-CM | POA: Diagnosis not present

## 2020-12-26 DIAGNOSIS — I10 Essential (primary) hypertension: Secondary | ICD-10-CM | POA: Diagnosis not present

## 2020-12-26 DIAGNOSIS — I2510 Atherosclerotic heart disease of native coronary artery without angina pectoris: Secondary | ICD-10-CM | POA: Diagnosis not present

## 2020-12-26 DIAGNOSIS — B9620 Unspecified Escherichia coli [E. coli] as the cause of diseases classified elsewhere: Secondary | ICD-10-CM | POA: Diagnosis not present

## 2020-12-26 DIAGNOSIS — M8448XD Pathological fracture, other site, subsequent encounter for fracture with routine healing: Secondary | ICD-10-CM | POA: Diagnosis not present

## 2020-12-26 DIAGNOSIS — I080 Rheumatic disorders of both mitral and aortic valves: Secondary | ICD-10-CM | POA: Diagnosis not present

## 2020-12-26 NOTE — Addendum Note (Signed)
Encounter addended by: Jeannette How on: 12/26/2020 10:30 AM  Actions taken: Imaging Exam ended

## 2020-12-28 DIAGNOSIS — N3001 Acute cystitis with hematuria: Secondary | ICD-10-CM | POA: Diagnosis not present

## 2020-12-28 DIAGNOSIS — I2510 Atherosclerotic heart disease of native coronary artery without angina pectoris: Secondary | ICD-10-CM | POA: Diagnosis not present

## 2020-12-28 DIAGNOSIS — B9620 Unspecified Escherichia coli [E. coli] as the cause of diseases classified elsewhere: Secondary | ICD-10-CM | POA: Diagnosis not present

## 2020-12-28 DIAGNOSIS — I10 Essential (primary) hypertension: Secondary | ICD-10-CM | POA: Diagnosis not present

## 2020-12-28 DIAGNOSIS — I080 Rheumatic disorders of both mitral and aortic valves: Secondary | ICD-10-CM | POA: Diagnosis not present

## 2020-12-28 DIAGNOSIS — M8448XD Pathological fracture, other site, subsequent encounter for fracture with routine healing: Secondary | ICD-10-CM | POA: Diagnosis not present

## 2020-12-31 ENCOUNTER — Other Ambulatory Visit
Admission: RE | Admit: 2020-12-31 | Discharge: 2020-12-31 | Disposition: A | Discharge status: Home / Self Care | Payer: Medicare Other | Attending: Nurse Practitioner | Admitting: Nurse Practitioner

## 2020-12-31 DIAGNOSIS — I10 Essential (primary) hypertension: Secondary | ICD-10-CM | POA: Diagnosis not present

## 2020-12-31 DIAGNOSIS — I25118 Atherosclerotic heart disease of native coronary artery with other forms of angina pectoris: Secondary | ICD-10-CM | POA: Insufficient documentation

## 2020-12-31 DIAGNOSIS — R55 Syncope and collapse: Secondary | ICD-10-CM | POA: Insufficient documentation

## 2020-12-31 DIAGNOSIS — I350 Nonrheumatic aortic (valve) stenosis: Secondary | ICD-10-CM | POA: Insufficient documentation

## 2020-12-31 DIAGNOSIS — N3001 Acute cystitis with hematuria: Secondary | ICD-10-CM | POA: Diagnosis not present

## 2020-12-31 DIAGNOSIS — B9620 Unspecified Escherichia coli [E. coli] as the cause of diseases classified elsewhere: Secondary | ICD-10-CM | POA: Diagnosis not present

## 2020-12-31 DIAGNOSIS — I080 Rheumatic disorders of both mitral and aortic valves: Secondary | ICD-10-CM | POA: Diagnosis not present

## 2020-12-31 DIAGNOSIS — I2510 Atherosclerotic heart disease of native coronary artery without angina pectoris: Secondary | ICD-10-CM | POA: Diagnosis not present

## 2020-12-31 DIAGNOSIS — M8448XD Pathological fracture, other site, subsequent encounter for fracture with routine healing: Secondary | ICD-10-CM | POA: Diagnosis not present

## 2020-12-31 LAB — BASIC METABOLIC PANEL
Anion gap: 10 (ref 5–15)
BUN: 11 mg/dL (ref 8–23)
CO2: 24 mmol/L (ref 22–32)
Calcium: 9.1 mg/dL (ref 8.9–10.3)
Chloride: 97 mmol/L — ABNORMAL LOW (ref 98–111)
Creatinine, Ser: 0.54 mg/dL (ref 0.44–1.00)
GFR, Estimated: >60 mL/min (ref >60)
Glucose, Bld: 109 mg/dL — ABNORMAL HIGH (ref 70–99)
Potassium: 4 mmol/L (ref 3.5–5.1)
Sodium: 131 mmol/L — ABNORMAL LOW (ref 135–145)

## 2020-12-31 LAB — MAGNESIUM: Magnesium: 1.3 mg/dL — ABNORMAL LOW (ref 1.7–2.4)

## 2021-01-01 ENCOUNTER — Other Ambulatory Visit: Payer: Self-pay | Admitting: *Deleted

## 2021-01-01 DIAGNOSIS — I25118 Atherosclerotic heart disease of native coronary artery with other forms of angina pectoris: Secondary | ICD-10-CM

## 2021-01-01 DIAGNOSIS — R55 Syncope and collapse: Secondary | ICD-10-CM

## 2021-01-01 DIAGNOSIS — Z9861 Coronary angioplasty status: Secondary | ICD-10-CM

## 2021-01-01 DIAGNOSIS — I2510 Atherosclerotic heart disease of native coronary artery without angina pectoris: Secondary | ICD-10-CM

## 2021-01-01 MED ORDER — MAGNESIUM-OXIDE 400 (240 MG) MG PO TABS: 1.0000 | ORAL_TABLET | Freq: Three times a day (TID) | ORAL | 2 refills | Status: AC

## 2021-01-02 ENCOUNTER — Telehealth: Payer: Self-pay | Admitting: Nurse Practitioner

## 2021-01-02 DIAGNOSIS — B9620 Unspecified Escherichia coli [E. coli] as the cause of diseases classified elsewhere: Secondary | ICD-10-CM | POA: Diagnosis not present

## 2021-01-02 DIAGNOSIS — I2510 Atherosclerotic heart disease of native coronary artery without angina pectoris: Secondary | ICD-10-CM | POA: Diagnosis not present

## 2021-01-02 DIAGNOSIS — N3001 Acute cystitis with hematuria: Secondary | ICD-10-CM | POA: Diagnosis not present

## 2021-01-02 DIAGNOSIS — M8448XD Pathological fracture, other site, subsequent encounter for fracture with routine healing: Secondary | ICD-10-CM | POA: Diagnosis not present

## 2021-01-02 DIAGNOSIS — I080 Rheumatic disorders of both mitral and aortic valves: Secondary | ICD-10-CM | POA: Diagnosis not present

## 2021-01-02 DIAGNOSIS — I10 Essential (primary) hypertension: Secondary | ICD-10-CM | POA: Diagnosis not present

## 2021-01-02 MED ORDER — POTASSIUM CHLORIDE CRYS ER 20 MEQ PO TBCR: 20.0000 meq | EXTENDED_RELEASE_TABLET | Freq: Every day | ORAL | 3 refills | Status: DC

## 2021-01-02 NOTE — Telephone Encounter (Signed)
Patient family  calling to discuss below and to advise on potassium.

## 2021-01-02 NOTE — Telephone Encounter (Signed)
Valora Corporal, RN  01/01/2021 4:51 PM EDT Back to Top     Spoke with patients caregiver [name] per release form. Reviewed results and recommendations. Instructed her to have repeat magnesium level rechecked in 2 weeks at the [facility] entrance. She verbalized understanding with no further questions at this time.    Theora Gianotti, NP  12/31/2020 3:40 PM EDT      Kidney function and lytes are stable. Sodium stable @ 131. Mg remains low, though relatively stable. I recommend increasing Mag Ox to 400mg  three times/day w/ f/u Mg in 2 wks. Will have to watch closely for development of diarrhea.

## 2021-01-02 NOTE — Telephone Encounter (Signed)
Spoke with patients caregiver and she just wanted to confirm if she is to continue potassium. Reviewed no other changes were ordered and she should continue. She needs refill to be sent into pharmacy. Confirmed refill has been sent in. She was appreciative for the call with no further questions at this time.

## 2021-01-07 DIAGNOSIS — I2510 Atherosclerotic heart disease of native coronary artery without angina pectoris: Secondary | ICD-10-CM | POA: Diagnosis not present

## 2021-01-07 DIAGNOSIS — K219 Gastro-esophageal reflux disease without esophagitis: Secondary | ICD-10-CM | POA: Diagnosis not present

## 2021-01-07 DIAGNOSIS — J45909 Unspecified asthma, uncomplicated: Secondary | ICD-10-CM | POA: Diagnosis not present

## 2021-01-07 DIAGNOSIS — F419 Anxiety disorder, unspecified: Secondary | ICD-10-CM | POA: Diagnosis not present

## 2021-01-07 DIAGNOSIS — M47812 Spondylosis without myelopathy or radiculopathy, cervical region: Secondary | ICD-10-CM | POA: Diagnosis not present

## 2021-01-07 DIAGNOSIS — K259 Gastric ulcer, unspecified as acute or chronic, without hemorrhage or perforation: Secondary | ICD-10-CM | POA: Diagnosis not present

## 2021-01-07 DIAGNOSIS — M8448XD Pathological fracture, other site, subsequent encounter for fracture with routine healing: Secondary | ICD-10-CM | POA: Diagnosis not present

## 2021-01-07 DIAGNOSIS — E559 Vitamin D deficiency, unspecified: Secondary | ICD-10-CM | POA: Diagnosis not present

## 2021-01-07 DIAGNOSIS — E871 Hypo-osmolality and hyponatremia: Secondary | ICD-10-CM | POA: Diagnosis not present

## 2021-01-07 DIAGNOSIS — I080 Rheumatic disorders of both mitral and aortic valves: Secondary | ICD-10-CM | POA: Diagnosis not present

## 2021-01-07 DIAGNOSIS — D519 Vitamin B12 deficiency anemia, unspecified: Secondary | ICD-10-CM | POA: Diagnosis not present

## 2021-01-07 DIAGNOSIS — B9620 Unspecified Escherichia coli [E. coli] as the cause of diseases classified elsewhere: Secondary | ICD-10-CM | POA: Diagnosis not present

## 2021-01-07 DIAGNOSIS — H9190 Unspecified hearing loss, unspecified ear: Secondary | ICD-10-CM | POA: Diagnosis not present

## 2021-01-07 DIAGNOSIS — R053 Chronic cough: Secondary | ICD-10-CM | POA: Diagnosis not present

## 2021-01-07 DIAGNOSIS — I10 Essential (primary) hypertension: Secondary | ICD-10-CM | POA: Diagnosis not present

## 2021-01-07 DIAGNOSIS — E785 Hyperlipidemia, unspecified: Secondary | ICD-10-CM | POA: Diagnosis not present

## 2021-01-07 DIAGNOSIS — R918 Other nonspecific abnormal finding of lung field: Secondary | ICD-10-CM | POA: Diagnosis not present

## 2021-01-07 DIAGNOSIS — M1990 Unspecified osteoarthritis, unspecified site: Secondary | ICD-10-CM | POA: Diagnosis not present

## 2021-01-07 DIAGNOSIS — K589 Irritable bowel syndrome without diarrhea: Secondary | ICD-10-CM | POA: Diagnosis not present

## 2021-01-07 DIAGNOSIS — K5790 Diverticulosis of intestine, part unspecified, without perforation or abscess without bleeding: Secondary | ICD-10-CM | POA: Diagnosis not present

## 2021-01-07 DIAGNOSIS — N3001 Acute cystitis with hematuria: Secondary | ICD-10-CM | POA: Diagnosis not present

## 2021-01-07 DIAGNOSIS — D509 Iron deficiency anemia, unspecified: Secondary | ICD-10-CM | POA: Diagnosis not present

## 2021-01-07 DIAGNOSIS — E876 Hypokalemia: Secondary | ICD-10-CM | POA: Diagnosis not present

## 2021-01-07 DIAGNOSIS — F32A Depression, unspecified: Secondary | ICD-10-CM | POA: Diagnosis not present

## 2021-01-08 ENCOUNTER — Ambulatory Visit (INDEPENDENT_AMBULATORY_CARE_PROVIDER_SITE_OTHER): Payer: Medicare Other | Admitting: Family Medicine

## 2021-01-08 ENCOUNTER — Encounter: Payer: Self-pay | Admitting: Family Medicine

## 2021-01-08 ENCOUNTER — Other Ambulatory Visit: Payer: Self-pay

## 2021-01-08 VITALS — BP 140/80 | HR 79 | Temp 97.9°F | Ht 59.0 in | Wt 117.0 lb

## 2021-01-08 DIAGNOSIS — I10 Essential (primary) hypertension: Secondary | ICD-10-CM

## 2021-01-08 DIAGNOSIS — M8448XD Pathological fracture, other site, subsequent encounter for fracture with routine healing: Secondary | ICD-10-CM | POA: Diagnosis not present

## 2021-01-08 DIAGNOSIS — S22030S Wedge compression fracture of third thoracic vertebra, sequela: Secondary | ICD-10-CM | POA: Diagnosis not present

## 2021-01-08 DIAGNOSIS — N3001 Acute cystitis with hematuria: Secondary | ICD-10-CM | POA: Diagnosis not present

## 2021-01-08 DIAGNOSIS — F32A Depression, unspecified: Secondary | ICD-10-CM

## 2021-01-08 DIAGNOSIS — F419 Anxiety disorder, unspecified: Secondary | ICD-10-CM

## 2021-01-08 DIAGNOSIS — Z780 Asymptomatic menopausal state: Secondary | ICD-10-CM | POA: Diagnosis not present

## 2021-01-08 DIAGNOSIS — M8580 Other specified disorders of bone density and structure, unspecified site: Secondary | ICD-10-CM | POA: Diagnosis not present

## 2021-01-08 DIAGNOSIS — I2510 Atherosclerotic heart disease of native coronary artery without angina pectoris: Secondary | ICD-10-CM

## 2021-01-08 DIAGNOSIS — R911 Solitary pulmonary nodule: Secondary | ICD-10-CM

## 2021-01-08 DIAGNOSIS — F0390 Unspecified dementia without behavioral disturbance: Secondary | ICD-10-CM

## 2021-01-08 DIAGNOSIS — R55 Syncope and collapse: Secondary | ICD-10-CM

## 2021-01-08 DIAGNOSIS — Z9861 Coronary angioplasty status: Secondary | ICD-10-CM | POA: Diagnosis not present

## 2021-01-08 DIAGNOSIS — I080 Rheumatic disorders of both mitral and aortic valves: Secondary | ICD-10-CM | POA: Diagnosis not present

## 2021-01-08 DIAGNOSIS — B9620 Unspecified Escherichia coli [E. coli] as the cause of diseases classified elsewhere: Secondary | ICD-10-CM | POA: Diagnosis not present

## 2021-01-08 NOTE — Patient Instructions (Signed)
Nice to see you. Please call [phone] to schedule your bone density scan. Psychiatry should contact you to schedule an appointment.  If you do not hear anything within 1 to 2 weeks please let us know.

## 2021-01-10 DIAGNOSIS — N3001 Acute cystitis with hematuria: Secondary | ICD-10-CM | POA: Diagnosis not present

## 2021-01-10 DIAGNOSIS — B9620 Unspecified Escherichia coli [E. coli] as the cause of diseases classified elsewhere: Secondary | ICD-10-CM | POA: Diagnosis not present

## 2021-01-10 DIAGNOSIS — I2510 Atherosclerotic heart disease of native coronary artery without angina pectoris: Secondary | ICD-10-CM | POA: Diagnosis not present

## 2021-01-10 DIAGNOSIS — R911 Solitary pulmonary nodule: Secondary | ICD-10-CM | POA: Insufficient documentation

## 2021-01-10 DIAGNOSIS — M8448XD Pathological fracture, other site, subsequent encounter for fracture with routine healing: Secondary | ICD-10-CM | POA: Diagnosis not present

## 2021-01-10 DIAGNOSIS — I10 Essential (primary) hypertension: Secondary | ICD-10-CM | POA: Diagnosis not present

## 2021-01-10 DIAGNOSIS — I080 Rheumatic disorders of both mitral and aortic valves: Secondary | ICD-10-CM | POA: Diagnosis not present

## 2021-01-10 NOTE — Assessment & Plan Note (Signed)
New on CT imaging.  Plan to discuss follow-up imaging at the patient's next visit.

## 2021-01-10 NOTE — Assessment & Plan Note (Signed)
Given that the patient is not having any pain and does not plan to undergo any procedures for this if needed I think it is reasonable for her to cancel her neurosurgery appointment at the patient's request.  Discussed getting a bone density scan.  The patient or her sister will call to get this scheduled.

## 2021-01-10 NOTE — Assessment & Plan Note (Signed)
Acceptable today.  Discussed that she has a more liberalized blood pressure goal given her aortic stenosis.  Advised if she consistently elevated above the 592T or 244Q systolically they would need to let us and cardiology know.

## 2021-01-10 NOTE — Progress Notes (Signed)
[name], MD Phone: [phone]  Amy Morton is a 86 y.o. female who presents today for follow-up.  Anxiety/depression: Patient continues to have issues with anxiety and depression.  She is no longer on Cymbalta.  The trazodone 50 mg nightly does seem to help her sleep.  No SI.  They feel as though her memory is worse as well.  She is now willing to see psychiatry.  Fall: Patient and her sister are unsure of the exact mechanism as it was unwitnessed.  There was some question of whether it was a mechanical fall or related to syncope.  She was hospitalized for this.  She has had no chest pain or shortness of breath.  She ended up with a compression fracture and was also found to have low magnesium and low potassium.  She is currently on supplementation for magnesium and potassium that is being followed by cardiology.  She has had no diarrhea issues with the magnesium supplementation.  She had an extensive cardiac work-up with no obvious cause for her fall.  She has been doing PT for balance training and that seems to be working well as she is walking better.  Compression fracture: The patient is supposed to follow-up with neurosurgery though she and her sister do not understand the reason for this.  The patient's pain has resolved.  The patient and her sister both note they do not feel like she would be appropriate for a procedure for the compression fracture.  Hypertension: Generally they report her blood pressure has been acceptable though last night the patient just did not feel right and they checked her blood pressure and it was 190/94.  They waited a little while and checked again and it came down into a more acceptable range.  Social History   Tobacco Use  Smoking Status Never  Smokeless Tobacco Never    Current Outpatient Medications on File Prior to Visit  Medication Sig Dispense Refill   acetaminophen (TYLENOL) 500 MG tablet Take 1,000 mg by mouth in the morning and at  bedtime.     Ascorbic Acid (VITAMIN C PO) Take by mouth.     aspirin 81 MG tablet Take 81 mg by mouth daily.     atorvastatin (LIPITOR) 40 MG tablet Take 1 tablet (40 mg total) by mouth daily. 90 tablet 3   Cholecalciferol 5000 units TABS Take 5,000 Units by mouth daily.     clopidogrel (PLAVIX) 75 MG tablet TAKE 1 TABLET BY MOUTH DAILY WITH BREAKFAST 90 tablet 1   colestipol (COLESTID) 1 g tablet TAKE 1 TABLET(1 GRAM) BY MOUTH DAILY 90 tablet 1   isosorbide mononitrate (IMDUR) 30 MG 24 hr tablet TAKE 1 AND 1/2 TABLETS(45 MG) BY MOUTH DAILY 135 tablet 1   MAGNESIUM-OXIDE 400 (240 Mg) MG tablet Take 1 tablet (400 mg total) by mouth 3 (three) times daily. 270 tablet 2   metoprolol tartrate (LOPRESSOR) 50 MG tablet TAKE 1 TABLET(50 MG) BY MOUTH TWICE DAILY 180 tablet 1   Multiple Vitamin (MULTIVITAMIN) capsule Take 1 capsule by mouth daily.     nitroGLYCERIN (NITROSTAT) 0.4 MG SL tablet PLACE 1 TABLET UNDER THE TONGUE EVERY 5 MINUTES AS NEEDED FOR CHEST PAIN, 3 DOSES MAX 25 tablet 1   Nutritional Supplements (OSTEO ADVANCE) TABS Take 1 tablet by mouth daily.      omega-3 acid ethyl esters (LOVAZA) 1 g capsule TAKE 2 CAPSULES BY MOUTH TWICE DAILY 360 capsule 1   pantoprazole (PROTONIX) 40 MG tablet TAKE 1  TABLET(40 MG) BY MOUTH DAILY 90 tablet 1   potassium chloride SA (KLOR-CON) 20 MEQ tablet Take 1 tablet (20 mEq total) by mouth daily. 90 tablet 3   traZODone (DESYREL) 50 MG tablet Take 0.5-1 tablets (25-50 mg total) by mouth at bedtime as needed for sleep. 30 tablet 3   Ubiquinol 100 MG CAPS Take 100 mg by mouth every morning.     vitamin B-12 (CYANOCOBALAMIN) 250 MCG tablet Take 250 mcg by mouth daily.     clonazePAM (KLONOPIN) 0.5 MG tablet Take 0.5 mg by mouth daily in the afternoon. (Patient not taking: Reported on 01/08/2021)     Current Facility-Administered Medications on File Prior to Visit  Medication Dose Route Frequency Provider Last Rate Last Admin   sodium chloride 0.9 % nebulizer  solution 3 mL  3 mL Nebulization Once [name], MD       Followed by   methacholine (PROVOCHOLINE) inhaler solution 0.125 mg  2 mL Inhalation Once [name], MD       Followed by   methacholine (PROVOCHOLINE) inhaler solution 0.5 mg  2 mL Inhalation Once [name], MD       Followed by   methacholine (PROVOCHOLINE) inhaler solution 2 mg  2 mL Inhalation Once [name], MD       Followed by   methacholine (PROVOCHOLINE) inhaler solution 8 mg  2 mL Inhalation Once [name], MD       Followed by   methacholine (PROVOCHOLINE) inhaler solution 32 mg  2 mL Inhalation Once [name], MD         ROS see history of present illness  Objective  Physical Exam Vitals:   01/08/21 1228  BP: 140/80  Pulse: 79  Temp: 97.9 F (36.6 C)  SpO2: 99%    BP Readings from Last 3 Encounters:  01/08/21 140/80  12/12/20 (!) 158/60  12/06/20 (!) 164/61   Wt Readings from Last 3 Encounters:  01/08/21 117 lb (53.1 kg)  12/12/20 122 lb 8 oz (55.6 kg)  12/05/20 139 lb 6.4 oz (63.2 kg)    Physical Exam Constitutional:      General: She is not in acute distress.    Appearance: She is not diaphoretic.  Cardiovascular:     Rate and Rhythm: Normal rate and regular rhythm.     Heart sounds: Normal heart sounds.  Pulmonary:     Effort: Pulmonary effort is normal.     Breath sounds: Normal breath sounds.  Musculoskeletal:     Comments: No midline spine tenderness  Skin:    General: Skin is warm and dry.  Neurological:     Mental Status: She is alert.     Assessment/Plan: Please see individual problem list.  Problem List Items Addressed This Visit     Anxiety and depression - Primary    Chronic issue.  This has been difficult to treat related to multiple side effects or ineffectiveness of medications.  We will refer to psychiatry for evaluation of anxiety and depression as well as for memory issues.  She can continue trazodone 50 mg once  nightly as needed for sleep.       Relevant Orders   Ambulatory referral to Psychiatry   Essential hypertension    Acceptable today.  Discussed that she has a more liberalized blood pressure goal given her aortic stenosis.  Advised if she consistently elevated above the 989Q or 119E systolically they would need to let us and  cardiology know.       Syncope and collapse    Undetermined if her fall was related to syncope or a mechanical fall.  She is completing work-up with cardiology for this.       Compression fracture of T3 vertebra (HCC)    Given that the patient is not having any pain and does not plan to undergo any procedures for this if needed I think it is reasonable for her to cancel her neurosurgery appointment at the patient's request.  Discussed getting a bone density scan.  The patient or her sister will call to get this scheduled.       Relevant Orders   DG Bone Density   Lung nodule    New on CT imaging.  Plan to discuss follow-up imaging at the patient's next visit.       Other Visit Diagnoses     Dementia without behavioral disturbance, unspecified dementia type ([place])       Relevant Orders   Ambulatory referral to Psychiatry   Osteopenia, unspecified location       Relevant Orders   DG Bone Density   Postmenopausal estrogen deficiency       Relevant Orders   DG Bone Density       Return in about 3 months (around 04/10/2021).  This visit occurred during the SARS-CoV-2 public health emergency.  Safety protocols were in place, including screening questions prior to the visit, additional usage of staff PPE, and extensive cleaning of exam room while observing appropriate contact time as indicated for disinfecting solutions.    [name], MD [facility]

## 2021-01-10 NOTE — Assessment & Plan Note (Addendum)
Chronic issue.  This has been difficult to treat related to multiple side effects or ineffectiveness of medications.  We will refer to psychiatry for evaluation of anxiety and depression as well as for memory issues.  She can continue trazodone 50 mg once nightly as needed for sleep.

## 2021-01-10 NOTE — Assessment & Plan Note (Signed)
Undetermined if her fall was related to syncope or a mechanical fall.  She is completing work-up with cardiology for this.

## 2021-01-15 ENCOUNTER — Other Ambulatory Visit
Admission: RE | Admit: 2021-01-15 | Discharge: 2021-01-15 | Disposition: A | Discharge status: Home / Self Care | Payer: Medicare Other | Attending: Nurse Practitioner | Admitting: Nurse Practitioner

## 2021-01-15 DIAGNOSIS — R55 Syncope and collapse: Secondary | ICD-10-CM | POA: Insufficient documentation

## 2021-01-15 DIAGNOSIS — I25118 Atherosclerotic heart disease of native coronary artery with other forms of angina pectoris: Secondary | ICD-10-CM | POA: Insufficient documentation

## 2021-01-15 DIAGNOSIS — Z9861 Coronary angioplasty status: Secondary | ICD-10-CM | POA: Diagnosis not present

## 2021-01-15 DIAGNOSIS — I2510 Atherosclerotic heart disease of native coronary artery without angina pectoris: Secondary | ICD-10-CM | POA: Diagnosis not present

## 2021-01-15 LAB — MAGNESIUM: Magnesium: 1.6 mg/dL — ABNORMAL LOW (ref 1.7–2.4)

## 2021-01-16 DIAGNOSIS — I10 Essential (primary) hypertension: Secondary | ICD-10-CM | POA: Diagnosis not present

## 2021-01-16 DIAGNOSIS — I080 Rheumatic disorders of both mitral and aortic valves: Secondary | ICD-10-CM | POA: Diagnosis not present

## 2021-01-16 DIAGNOSIS — B9620 Unspecified Escherichia coli [E. coli] as the cause of diseases classified elsewhere: Secondary | ICD-10-CM | POA: Diagnosis not present

## 2021-01-16 DIAGNOSIS — N3001 Acute cystitis with hematuria: Secondary | ICD-10-CM | POA: Diagnosis not present

## 2021-01-16 DIAGNOSIS — I2510 Atherosclerotic heart disease of native coronary artery without angina pectoris: Secondary | ICD-10-CM | POA: Diagnosis not present

## 2021-01-16 DIAGNOSIS — M8448XD Pathological fracture, other site, subsequent encounter for fracture with routine healing: Secondary | ICD-10-CM | POA: Diagnosis not present

## 2021-01-21 ENCOUNTER — Other Ambulatory Visit: Payer: Self-pay | Admitting: Family Medicine

## 2021-01-22 ENCOUNTER — Encounter: Payer: Self-pay | Admitting: Family Medicine

## 2021-01-22 DIAGNOSIS — F419 Anxiety disorder, unspecified: Secondary | ICD-10-CM

## 2021-01-22 NOTE — Telephone Encounter (Signed)
See other MyChart message

## 2021-01-23 NOTE — Telephone Encounter (Signed)
I do not think it is necessary for her to see me. As I discussed with them previously I am not completely sure what to do regarding medications to treat her anxiety and depression given issues with prior medication trials.

## 2021-01-24 ENCOUNTER — Ambulatory Visit: Payer: Medicare Other | Admitting: Family Medicine

## 2021-01-31 ENCOUNTER — Ambulatory Visit
Admission: RE | Admit: 2021-01-31 | Discharge: 2021-01-31 | Disposition: A | Discharge status: Home / Self Care | Payer: Medicare Other | Source: Ambulatory Visit | Attending: Family Medicine | Admitting: Family Medicine

## 2021-01-31 ENCOUNTER — Other Ambulatory Visit: Payer: Self-pay

## 2021-01-31 DIAGNOSIS — M8580 Other specified disorders of bone density and structure, unspecified site: Secondary | ICD-10-CM | POA: Diagnosis not present

## 2021-01-31 DIAGNOSIS — Z780 Asymptomatic menopausal state: Secondary | ICD-10-CM | POA: Diagnosis not present

## 2021-01-31 DIAGNOSIS — S22030S Wedge compression fracture of third thoracic vertebra, sequela: Secondary | ICD-10-CM | POA: Diagnosis not present

## 2021-01-31 DIAGNOSIS — M8589 Other specified disorders of bone density and structure, multiple sites: Secondary | ICD-10-CM | POA: Diagnosis not present

## 2021-02-03 DIAGNOSIS — Z20822 Contact with and (suspected) exposure to covid-19: Secondary | ICD-10-CM | POA: Diagnosis not present

## 2021-02-08 ENCOUNTER — Other Ambulatory Visit: Payer: Self-pay

## 2021-02-08 ENCOUNTER — Encounter: Payer: Self-pay | Admitting: Nurse Practitioner

## 2021-02-08 ENCOUNTER — Ambulatory Visit (INDEPENDENT_AMBULATORY_CARE_PROVIDER_SITE_OTHER): Payer: Medicare Other | Admitting: Nurse Practitioner

## 2021-02-08 VITALS — BP 148/76 | HR 84 | Ht 59.0 in | Wt 117.0 lb

## 2021-02-08 DIAGNOSIS — I350 Nonrheumatic aortic (valve) stenosis: Secondary | ICD-10-CM | POA: Diagnosis not present

## 2021-02-08 DIAGNOSIS — W19XXXA Unspecified fall, initial encounter: Secondary | ICD-10-CM

## 2021-02-08 DIAGNOSIS — I2510 Atherosclerotic heart disease of native coronary artery without angina pectoris: Secondary | ICD-10-CM

## 2021-02-08 DIAGNOSIS — I10 Essential (primary) hypertension: Secondary | ICD-10-CM

## 2021-02-08 DIAGNOSIS — R55 Syncope and collapse: Secondary | ICD-10-CM

## 2021-02-08 DIAGNOSIS — I25118 Atherosclerotic heart disease of native coronary artery with other forms of angina pectoris: Secondary | ICD-10-CM

## 2021-02-08 DIAGNOSIS — Z9861 Coronary angioplasty status: Secondary | ICD-10-CM

## 2021-02-08 DIAGNOSIS — E785 Hyperlipidemia, unspecified: Secondary | ICD-10-CM | POA: Diagnosis not present

## 2021-02-08 NOTE — Progress Notes (Addendum)
Heart failure and aortic stenosis   Office Visit    Patient Name: Amy Morton Date of Encounter: 02/08/2021  Primary Care Provider:  [name], MD Primary Cardiologist:  Kathlyn Sacramento, MD  Chief Complaint    86 year old female with a history of CAD, severe aortic stenosis, hypertension, hyperlipidemia, syncope, PSVT, HFpEF, aortic insufficiency, anxiety, depression, anemia, and GERD, who presents for follow-up related to aortic stenosis.  Past Medical History    Past Medical History:  Diagnosis Date   (HFpEF) heart failure with preserved ejection fraction ([place])    a. 08/2020 PET: Not suggestive of transthyretin amyloidosis; b. 11/2020 Echo: 60-65%, gr1 DD.   Abnormal mammogram 2007   Recommend close follow up   Anemia    Iron, b12, SPEP, UPEP normal, 12/2012   Anxiety    Aortic insufficiency    a. 06/2020 cMRI: mild AI.   Arthritis    Asthma    CAD (coronary artery disease)    a. 1996 s/p PTCA; b. 06/2011 Cath: LCX 50, RCA 100 CTO; c. 03/2015 PCI: LAD 70, LCX 48m w/ abnl FFR (3.0x12 Synergy & 2.5x8 Synergy DESs); d. 01/2018 Cath: stable anatomy, patent LCX stents; e. 04/2020 Cath: LM nl, LAD 40p, 106m, D2 70ost, LCX 40ost, patent stents, RCA 70p/m, 100d. RPAV fills via R->R collats. RPL2 fills via L->R collats-->Med Rx.   Carotid arterial disease ([place])    a. 05/2020 Carotid U/S: <50% bilat ICA stenoses.   Chronic cough    Depression    Diverticulosis    Dyspnea    Elevated LFTs 2010   s/p ultrasound w/ possible fatty liver; GI consult. Resolution 2011   Gastric ulcer    GERD (gastroesophageal reflux disease)    History of colonic polyps    Dr. Vira Agar   [facility] (hard of hearing)    Hyperlipidemia    Hypertension    Peritoneal carcinomatosis [facility]) 2001   Dr. Oliva Bustard & Dr. Claiborne Rigg s/p chemo   PSVT (paroxysmal supraventricular tachycardia) ([place])    a. 06/2020 Zio: 52 episodes of SVT (longest 59 secs; fastest 159 bpm).   Severe aortic stenosis    a. 05/2020  Cor CTA: Tricuspid AoV w/ sev reduced cusp separation and Ca2+; b. 06/2020 cMRI: Sev AS w/ mild AI. Mod MR. EF 69% w/ sev asymmetric LVH. Nl RV fxn; c. 11/2020 Echo: EF 60-65%, no rwma, Gr1 DD. Nl RV size/fxn. Mild MR. Mod-Sev AS (appears severe visually) AVA 0.94cm^2.   Shingles 2008   T-9 distribution   Syncope    a. 06/2020 Zio: 7 beats WCT (NSVT). 52 episodes of SVT (longest 59 secs, fastest 159 bpm).   Vitamin D deficiency    Wheezing    Past Surgical History:  Procedure Laterality Date   ABDOMINAL HYSTERECTOMY  2001   APPENDECTOMY     CARDIAC CATHETERIZATION  2007   50% mid LAD stenosis, 70% proximal RCA with 100% distal RCA stenosis with collaterals, EF 65%   CARDIAC CATHETERIZATION N/A 04/27/2015   Procedure: Left Heart Cath and Coronary Angiography;  Surgeon: Jettie Booze, MD;  Location: [place] [facility];  Service: Cardiovascular;  Laterality: N/A;   CARDIAC CATHETERIZATION N/A 04/27/2015   Procedure: Coronary Stent Intervention;  Surgeon: Jettie Booze, MD;  Location: [place] [facility];  Service: Cardiovascular;  Laterality: N/A;   CATARACT EXTRACTION W/PHACO Left 09/25/2016   Procedure: CATARACT EXTRACTION PHACO AND INTRAOCULAR LENS PLACEMENT (IOC);  Surgeon: Eulogio Bear, MD;  Location: [facility];  Service:  Ophthalmology;  Laterality: Left;  [place] 46.7 AP% 8.1 CDE 3.80 Fluid pack lot # [id number]   CATARACT EXTRACTION W/PHACO Right 11/06/2016   Procedure: CATARACT EXTRACTION PHACO AND INTRAOCULAR LENS PLACEMENT (IOC);  Surgeon: Eulogio Bear, MD;  Location: [facility];  Service: Ophthalmology;  Laterality: Right;  [place] 49.5 AP8.7 CDE4.28 LOT # [id number]   CHOLECYSTECTOMY  1987   COLECTOMY  2001   CORONARY ANGIOPLASTY  1996   CORONARY ANGIOPLASTY     LAPAROSCOPIC BILATERAL SALPINGO OOPHERECTOMY  1988   Dr. [place] CATH AND CORONARY ANGIOGRAPHY N/A 01/25/2018   Procedure: LEFT HEART CATH AND CORONARY ANGIOGRAPHY;  Surgeon: Wellington Hampshire, MD;  Location:  [place] [facility];  Service: Cardiovascular;  Laterality: N/A;   RIGHT/LEFT HEART CATH AND CORONARY ANGIOGRAPHY N/A 05/16/2020   Procedure: RIGHT/LEFT HEART CATH AND CORONARY ANGIOGRAPHY;  Surgeon: Wellington Hampshire, MD;  Location: [place] [facility];  Service: Cardiovascular;  Laterality: N/A;   TONSILLECTOMY  1950    Allergies  Allergies  Allergen Reactions   Abilify [Aripiprazole] Other (See Comments)    Burning in Chest Elevated b/p Insomnia   Celexa [Citalopram]     Caused low sodium level.    Ibuprofen Other (See Comments)    GIB    History of Present Illness    86 year old female with above complex past medical history including coronary artery disease, severe aortic stenosis, aortic insufficiency, hypertension, hyperlipidemia, syncope, PSVT, HFpEF, anxiety, depression, anemia, and GERD.  Cardiac history dates back to 1996, at which time she underwent PTCA.  Catheterization in 2012 showed a chronic total occlusion of the right coronary artery.  Subsequent evaluation in September 2016 showed a 70% stenosis in the LAD as well as a 70% stenosis in the left circumflex with abnormal FFR.  The circumflex was successfully treated with 2 drug-eluting stents.  Catheterizations in 2019 and October 2021 showed stable anatomy.  October 2021 catheterization also showed low filling pressures, normal pulmonary pressure, normal cardiac output, and severe aortic stenosis with a mean gradient of 30 mmHg and valve area of 0.82 cm.  Unfortunately, November 2021, she was hospitalized with TIA.  In the setting of severe aortic stenosis, she was referred to structural heart clinic.  CT angiography of the chest, abdomen and pelvis, showed anatomy suitable for TAVR.  Cardiac MRI in December 2022 showed severe aortic stenosis with mild aortic insufficiency and moderate MR. PYP scan was performed in February 2022 and was not consistent with TTR amyloid.  Her case was reviewed by the multidisciplinary heart  valve team on February 8, and felt that overall, aortic stenosis was moderate.  They also had concerns about the patient's memory loss and long-term prognosis.  At office follow-up in April 2022, in the setting of ongoing struggles with anxiety, memory decline, and weight loss, it was agreed that she was a poor candidate for TAVR.  In May 2022 she was admitted to [facility] following a syncopal episode at home.  It is felt that there was a high likelihood of mechanical fall.  She was not orthostatic and no arrhythmias were noted on telemetry.  Echocardiogram showed an EF of 60-65% without regional wall motion abnormalities.  Moderate to severe aortic stenosis was noted.  During hospitalization she was noted to be hypokalemic and hypomagnesemic, and both were supplemented.  A Zio monitor was placed at discharge and showed predominantly sinus rhythm with 25 runs of SVT, the longest of which was 9 beats while the  fastest was 5 beats at 156 bpm.  No significant arrhythmias to explain syncope were identified.  At office visit May 18, she was doing well without any recurrent presyncope or syncope.  Follow-up labs are notable for ongoing hypomagnesemia for which outpatient dosing of Mag-Ox has been titrated to 40 mg 3 times daily with ongoing hypomagnesemia but with improvement to 1.6 late last month.  Since her last visit, she has done reasonably well from a cardiac standpoint.  She does not experience chest pain or dyspnea though her sister-in-law notes that at a recent trip to a retail shop, she did report fatigue and possibly lightheadedness while walking behind the carriage.  Patient does not really remember.  Last night, patient got up around 1 AM to use the bathroom and apparently fell.  Patient does not remember details but does say that she remembers falling and does not believe that she lost consciousness.  She then called out for her sister-in-law who attended to her in the bathroom.  Ms. Valencia has significant  bruising over her left eye and also on the back of her head.  She denies any pain at this time.  Further, she denies palpitations, PND, orthopnea, dizziness, syncope, edema, or early satiety.  Home Medications    Current Outpatient Medications  Medication Sig Dispense Refill   acetaminophen (TYLENOL) 500 MG tablet Take 1,000 mg by mouth in the morning and at bedtime.     Ascorbic Acid (VITAMIN C PO) Take by mouth.     aspirin 81 MG tablet Take 81 mg by mouth daily.     atorvastatin (LIPITOR) 40 MG tablet Take 1 tablet (40 mg total) by mouth daily. 90 tablet 3   Cholecalciferol 5000 units TABS Take 5,000 Units by mouth daily.     clopidogrel (PLAVIX) 75 MG tablet TAKE 1 TABLET BY MOUTH DAILY WITH BREAKFAST 90 tablet 1   colestipol (COLESTID) 1 g tablet TAKE 1 TABLET(1 GRAM) BY MOUTH DAILY 90 tablet 1   isosorbide mononitrate (IMDUR) 30 MG 24 hr tablet TAKE 1 AND 1/2 TABLETS(45 MG) BY MOUTH DAILY 135 tablet 1   MAGNESIUM-OXIDE 400 (240 Mg) MG tablet Take 1 tablet (400 mg total) by mouth 3 (three) times daily. 270 tablet 2   metoprolol tartrate (LOPRESSOR) 50 MG tablet TAKE 1 TABLET(50 MG) BY MOUTH TWICE DAILY 180 tablet 1   Multiple Vitamin (MULTIVITAMIN) capsule Take 1 capsule by mouth daily.     nitroGLYCERIN (NITROSTAT) 0.4 MG SL tablet PLACE 1 TABLET UNDER THE TONGUE EVERY 5 MINUTES AS NEEDED FOR CHEST PAIN, 3 DOSES MAX 25 tablet 1   Nutritional Supplements (OSTEO ADVANCE) TABS Take 1 tablet by mouth daily.      omega-3 acid ethyl esters (LOVAZA) 1 g capsule TAKE 2 CAPSULES BY MOUTH TWICE DAILY 360 capsule 1   pantoprazole (PROTONIX) 40 MG tablet TAKE 1 TABLET(40 MG) BY MOUTH DAILY 90 tablet 1   potassium chloride SA (KLOR-CON) 20 MEQ tablet Take 1 tablet (20 mEq total) by mouth daily. 90 tablet 3   traZODone (DESYREL) 50 MG tablet Take 0.5-1 tablets (25-50 mg total) by mouth at bedtime as needed for sleep. 30 tablet 3   Ubiquinol 100 MG CAPS Take 100 mg by mouth every morning.     vitamin  B-12 (CYANOCOBALAMIN) 250 MCG tablet Take 250 mcg by mouth daily.     No current facility-administered medications for this visit.   Facility-Administered Medications Ordered in Other Visits  Medication Dose Route Frequency Provider Last  Rate Last Admin   sodium chloride 0.9 % nebulizer solution 3 mL  3 mL Nebulization Once [name], MD       Followed by   methacholine (PROVOCHOLINE) inhaler solution 0.125 mg  2 mL Inhalation Once [name], MD       Followed by   methacholine (PROVOCHOLINE) inhaler solution 0.5 mg  2 mL Inhalation Once [name], MD       Followed by   methacholine (PROVOCHOLINE) inhaler solution 2 mg  2 mL Inhalation Once [name], MD       Followed by   methacholine (PROVOCHOLINE) inhaler solution 8 mg  2 mL Inhalation Once [name], MD       Followed by   methacholine (PROVOCHOLINE) inhaler solution 32 mg  2 mL Inhalation Once [name], MD        Review of Systems    She fell last night though does not remember the details.  She does not think that she lost consciousness.  She denies chest pain, dyspnea, palpitations, PND, orthopnea, dizziness, syncope, edema, or early satiety.  All other systems reviewed and are otherwise negative except as noted above.  Physical Exam    VS:  BP (!) 148/76 (BP Location: Right Arm, Patient Position: Sitting, Cuff Size: Normal)   Pulse 84   Ht 4\' 11"  (1.499 m)   Wt 117 lb (53.1 kg)   SpO2 97%   BMI 23.63 kg/m  , BMI Body mass index is 23.63 kg/m.     GEN: Well nourished, well developed, in no acute distress. HEENT: Notable for ecchymosis involving the left eyebrow and forehead as well as ecchymosis of the posterior midline scalp.  No bleeding. Neck: Supple, no JVD, carotid bruits, or masses. Cardiac: RRR, 2 of 6 stock ejection murmur loudest at the upper sternal borders but heard throughout.  No rubs, or gallops. No clubbing, cyanosis, edema.  Radials/PT 2+ and equal  bilaterally.  Respiratory:  Respirations regular and unlabored, clear to auscultation bilaterally. GI: Soft, nontender, nondistended, BS + x 4. MS: no deformity or atrophy. Skin: warm and dry, no rash. Neuro:  Strength and sensation are intact. Psych: Normal affect.  Accessory Clinical Findings    ECG personally reviewed by me today -regular sinus rhythm, 84, left axis deviation, LVH - no acute changes.  Lab Results  Component Value Date   WBC 8.6 12/06/2020   HGB 12.6 12/06/2020   HCT 35.2 (L) 12/06/2020   MCV 89.8 12/06/2020   PLT 207 12/06/2020   Lab Results  Component Value Date   CREATININE 0.54 12/31/2020   BUN 11 12/31/2020   NA 131 (L) 12/31/2020   K 4.0 12/31/2020   CL 97 (L) 12/31/2020   CO2 24 12/31/2020   Lab Results  Component Value Date   ALT 21 12/05/2020   AST 35 12/05/2020   ALKPHOS 44 12/05/2020   BILITOT 1.0 12/05/2020   Lab Results  Component Value Date   CHOL 95 06/06/2020   HDL 41 06/06/2020   LDLCALC 40 06/06/2020   LDLDIRECT 35.0 02/21/2019   TRIG 68 06/06/2020   CHOLHDL 2.3 06/06/2020    Lab Results  Component Value Date   HGBA1C 6.0 (H) 06/06/2020    Assessment & Plan    1.  Syncope/fall: Patient hospitalized at [facility] earlier this year with syncope of unclear etiology.  Monitoring did not reveal any significant arrhythmias.  Though she denies any recurrent syncope, she did  suffer what she believes was a mechanical fall last night resulting in left brow/forehead and posterior scalp trauma and bruising.  She remembers falling but is otherwise unclear on the details as to how she fell.  She also had an episode of presyncope recently while walking around a retail store.  Patient does not remember that episode though her sister-in-law says that she reported feeling fatigued and that possibly the room was spinning.  We discussed the potential role that aortic stenosis might play in exertional presyncope.  Further, in the setting of head trauma on  aspirin and Plavix therapy, I did recommend a head CT to rule out bleed today.  Patient currently feels well and neuro exam is normal.  She wishes avoid the emergency department and she and her sister-in-law instead preferred to continue to watch for mental status changes, which would then prompt ED eval.  I discussed her case and in particular, her fall, w/ Dr. Fletcher Anon.  I am going to stop Plavix given risk of bleeding and what's now a relatively remote PCI (2016).  2.  Coronary artery disease: Diagnostic catheterization October 2021 showed stable coronary anatomy with chronically occluded RCA and left-to-right collaterals.  She also had patent left circumflex stents.  She is not having any chest pain or dyspnea and remains on aspirin, statin, beta-blocker, and nitrate therapy.  As above, stopping plavix in setting of recent falls.  3.  Moderate to severe aortic stenosis: Previously evaluated by the structural heart team in multidisciplinary structural clinic and not felt to be a candidate for TAVR given gradual memory decline, severe anxiety, weight loss, and poor functional capacity.  Patient and family understand this as well as symptoms of progression of disease.  4.  Essential hypertension: Blood pressure elevated at 148/76.  Previous systolic goal outlined to be 150 mmHg.  No changes today.  5.  Hyperlipidemia: She remains on high potency statin therapy with an LDL of 40 in November 2021.  6.  Memory decline: This has been progressive.  Patient is awake alert and oriented today but has trouble remembering.  7.  Hypomagnesemia/hypokalemia: She has had repeat lab evaluation since her last visit with ongoing hypomagnesemia.  Most recent level was 1.6.  She is tolerating Mag-Ox 400 mg 3 times daily without diarrhea.  8.  Disposition: Recommendation made for patient to present to ED for head CT given fall on aspirin and Plavix with significant bruising over her left eye and posterior scalp.  As above,  patient and family wish to defer and will instead watch for mental status changes for now.  Follow-up in clinic in 3 months or sooner if necessary.   Murray Hodgkins, NP 02/08/2021, 6:31 PM

## 2021-02-08 NOTE — Patient Instructions (Signed)
Medication Instructions:  No changes at this time.  *If you need a refill on your cardiac medications before your next appointment, please call your pharmacy*   Lab Work: None  If you have labs (blood work) drawn today and your tests are completely normal, you will receive your results only by: [place] (if you have MyChart) OR A paper copy in the mail If you have any lab test that is abnormal or we need to change your treatment, we will call you to review the results.   Testing/Procedures: None    Follow-Up: At [facility], you and your health needs are our priority.  As part of our continuing mission to provide you with exceptional heart care, we have created designated Provider Care Teams.  These Care Teams include your primary Cardiologist (physician) and Advanced Practice Providers (APPs -  Physician Assistants and Nurse Practitioners) who all work together to provide you with the care you need, when you need it.   Your next appointment:   3 month(s)  The format for your next appointment:   In Person  Provider:   Kathlyn Sacramento, MD or Murray Hodgkins, NP

## 2021-02-09 NOTE — Addendum Note (Signed)
Addended by: Rogelia Mire on: 02/09/2021 06:35 AM   Modules accepted: Orders

## 2021-03-04 ENCOUNTER — Encounter: Payer: Self-pay | Admitting: Family Medicine

## 2021-03-04 ENCOUNTER — Ambulatory Visit (INDEPENDENT_AMBULATORY_CARE_PROVIDER_SITE_OTHER): Payer: Medicare Other | Admitting: Family Medicine

## 2021-03-04 ENCOUNTER — Other Ambulatory Visit: Payer: Self-pay

## 2021-03-04 VITALS — BP 140/80 | HR 78 | Temp 98.0°F | Ht 59.0 in | Wt 116.8 lb

## 2021-03-04 DIAGNOSIS — M810 Age-related osteoporosis without current pathological fracture: Secondary | ICD-10-CM | POA: Diagnosis not present

## 2021-03-04 DIAGNOSIS — I2510 Atherosclerotic heart disease of native coronary artery without angina pectoris: Secondary | ICD-10-CM

## 2021-03-04 DIAGNOSIS — R911 Solitary pulmonary nodule: Secondary | ICD-10-CM

## 2021-03-04 DIAGNOSIS — F32A Depression, unspecified: Secondary | ICD-10-CM

## 2021-03-04 DIAGNOSIS — F419 Anxiety disorder, unspecified: Secondary | ICD-10-CM | POA: Diagnosis not present

## 2021-03-04 DIAGNOSIS — Z9861 Coronary angioplasty status: Secondary | ICD-10-CM

## 2021-03-04 LAB — VITAMIN D 25 HYDROXY (VIT D DEFICIENCY, FRACTURES): VITD: 46.72 ng/mL (ref 30.00–100.00)

## 2021-03-04 MED ORDER — TRAZODONE HCL 50 MG PO TABS: 25.0000 mg | ORAL_TABLET | Freq: Every day | ORAL | 3 refills | Status: DC

## 2021-03-04 NOTE — Assessment & Plan Note (Signed)
Discussed the diagnosis of osteoporosis.  Discussed potential treatment options including Fosamax, Prolia, and zoledronic acid.  We are opting for Fosamax 70 mg once weekly.  We are going to check her vitamin D and if this level is acceptable we will send Fosamax in.  Discussed that she would need to remain upright for an hour after taking this.  Advised to let us know if she has any issues with aching or trouble swallowing while taking this medication.

## 2021-03-04 NOTE — Patient Instructions (Signed)
Nice to see you. We will check your vitamin D today and I will contact you with the results.  Once this returns we will let you know if we are going to send in the Fosamax or if we need to supplement your vitamin D level. Somebody should contact you to schedule a CT scan to follow-up on the lung nodule that was seen previously. Please try taking the trazodone nightly.  This may help with your sleep and anxiety.  I have also referred you to psychiatry.  They should contact [name] to schedule an appointment.

## 2021-03-04 NOTE — Assessment & Plan Note (Signed)
Continues to have issues with anxiety.  Discussed that she really needs to be evaluated by psychiatry at this time given the difficulty in finding an effective regimen.  We will have her restart the trazodone 50 mg once nightly to see if that helps with sleep and anxiety.  A psychiatry referral was placed.

## 2021-03-04 NOTE — Assessment & Plan Note (Signed)
Discussed follow-up imaging for this lesion.  Order placed to have this done in December 2022.

## 2021-03-04 NOTE — Progress Notes (Signed)
Amy Rumps, MD Phone: [phone]  Amy Morton is a 86 y.o. female who presents today for follow-up.  Osteoporosis: Patient had a recent bone density scan with a T score of -2.2.  She had an elevated FRAX score.  She had a compression fracture earlier this year.  Still has some soreness.  Notes no swallowing difficulty.  No history of esophageal or gastric ulcers.  Lung nodule: Noted on prior imaging.  She notes no smoking history.  No family history of lung cancer.  Anxiety: Patient continues to have issues with this.  She is having trouble sleeping.  She notes she does not think she is taking the trazodone.  She reports getting upset fairly frequently and having crying spells.  Her companion notes that it has gotten to the point where somebody has to come over and help calm her down.  She notes they never heard from psychiatry previously.  The patient has been on a number of medicines previously with either no benefit or she had side effects.  Social History   Tobacco Use  Smoking Status Never  Smokeless Tobacco Never    Current Outpatient Medications on File Prior to Visit  Medication Sig Dispense Refill   acetaminophen (TYLENOL) 500 MG tablet Take 1,000 mg by mouth in the morning and at bedtime.     Ascorbic Acid (VITAMIN C PO) Take by mouth.     aspirin 81 MG tablet Take 81 mg by mouth daily.     atorvastatin (LIPITOR) 40 MG tablet Take 1 tablet (40 mg total) by mouth daily. 90 tablet 3   Cholecalciferol 5000 units TABS Take 5,000 Units by mouth daily.     colestipol (COLESTID) 1 g tablet TAKE 1 TABLET(1 GRAM) BY MOUTH DAILY 90 tablet 1   isosorbide mononitrate (IMDUR) 30 MG 24 hr tablet TAKE 1 AND 1/2 TABLETS(45 MG) BY MOUTH DAILY 135 tablet 1   MAGNESIUM-OXIDE 400 (240 Mg) MG tablet Take 1 tablet (400 mg total) by mouth 3 (three) times daily. 270 tablet 2   metoprolol tartrate (LOPRESSOR) 50 MG tablet TAKE 1 TABLET(50 MG) BY MOUTH TWICE DAILY 180 tablet 1   Multiple  Vitamin (MULTIVITAMIN) capsule Take 1 capsule by mouth daily.     nitroGLYCERIN (NITROSTAT) 0.4 MG SL tablet PLACE 1 TABLET UNDER THE TONGUE EVERY 5 MINUTES AS NEEDED FOR CHEST PAIN, 3 DOSES MAX 25 tablet 1   Nutritional Supplements (OSTEO ADVANCE) TABS Take 1 tablet by mouth daily.      omega-3 acid ethyl esters (LOVAZA) 1 g capsule TAKE 2 CAPSULES BY MOUTH TWICE DAILY 360 capsule 1   pantoprazole (PROTONIX) 40 MG tablet TAKE 1 TABLET(40 MG) BY MOUTH DAILY 90 tablet 1   potassium chloride SA (KLOR-CON) 20 MEQ tablet Take 1 tablet (20 mEq total) by mouth daily. 90 tablet 3   Ubiquinol 100 MG CAPS Take 100 mg by mouth every morning.     vitamin B-12 (CYANOCOBALAMIN) 250 MCG tablet Take 250 mcg by mouth daily.     clopidogrel (PLAVIX) 75 MG tablet Take 75 mg by mouth daily.     Current Facility-Administered Medications on File Prior to Visit  Medication Dose Route Frequency Provider Last Rate Last Admin   sodium chloride 0.9 % nebulizer solution 3 mL  3 mL Nebulization Once [name], MD       Followed by   methacholine (PROVOCHOLINE) inhaler solution 0.125 mg  2 mL Inhalation Once [name], MD  Followed by   methacholine (PROVOCHOLINE) inhaler solution 0.5 mg  2 mL Inhalation Once [name], MD       Followed by   methacholine (PROVOCHOLINE) inhaler solution 2 mg  2 mL Inhalation Once [name], MD       Followed by   methacholine (PROVOCHOLINE) inhaler solution 8 mg  2 mL Inhalation Once [name], MD       Followed by   methacholine (PROVOCHOLINE) inhaler solution 32 mg  2 mL Inhalation Once [name], MD         ROS see history of present illness  Objective  Physical Exam Vitals:   03/04/21 1149  BP: 140/80  Pulse: 78  Temp: 98 F (36.7 C)  SpO2: 98%    BP Readings from Last 3 Encounters:  03/04/21 140/80  02/08/21 (!) 148/76  01/08/21 140/80   Wt Readings from Last 3 Encounters:  03/04/21 116 lb 12.8 oz (53  kg)  02/08/21 117 lb (53.1 kg)  01/08/21 117 lb (53.1 kg)    Physical Exam Constitutional:      General: She is not in acute distress.    Appearance: She is not diaphoretic.  Cardiovascular:     Rate and Rhythm: Normal rate and regular rhythm.  Pulmonary:     Effort: Pulmonary effort is normal.     Breath sounds: Normal breath sounds.  Musculoskeletal:     Comments: Slight midline tenderness near T3 where her prior compression fracture and was  Skin:    General: Skin is warm and dry.  Neurological:     Mental Status: She is alert.     Assessment/Plan: Please see individual problem list.  Problem List Items Addressed This Visit     Anxiety and depression (Chronic)    Continues to have issues with anxiety.  Discussed that she really needs to be evaluated by psychiatry at this time given the difficulty in finding an effective regimen.  We will have her restart the trazodone 50 mg once nightly to see if that helps with sleep and anxiety.  A psychiatry referral was placed.       Relevant Medications   traZODone (DESYREL) 50 MG tablet   Other Relevant Orders   Ambulatory referral to Psychiatry   Osteoporosis - Primary (Chronic)    Discussed the diagnosis of osteoporosis.  Discussed potential treatment options including Fosamax, Prolia, and zoledronic acid.  We are opting for Fosamax 70 mg once weekly.  We are going to check her vitamin D and if this level is acceptable we will send Fosamax in.  Discussed that she would need to remain upright for an hour after taking this.  Advised to let us know if she has any issues with aching or trouble swallowing while taking this medication.       Relevant Orders   Vitamin D (25 hydroxy)   Lung nodule    Discussed follow-up imaging for this lesion.  Order placed to have this done in December 2022.       Relevant Orders   CT Chest Wo Contrast     Return in about 3 months (around 06/04/2021) for Anxiety.  This visit occurred during  the SARS-CoV-2 public health emergency.  Safety protocols were in place, including screening questions prior to the visit, additional usage of staff PPE, and extensive cleaning of exam room while observing appropriate contact time as indicated for disinfecting solutions.    Amy Rumps, MD [facility] -  [employer]

## 2021-03-07 ENCOUNTER — Other Ambulatory Visit: Payer: Self-pay | Admitting: Family Medicine

## 2021-03-07 MED ORDER — ALENDRONATE SODIUM 70 MG PO TABS
70.0000 mg | ORAL_TABLET | ORAL | 11 refills | Status: DC
Start: 2021-03-07 — End: 2021-09-03

## 2021-03-20 ENCOUNTER — Other Ambulatory Visit: Payer: Self-pay

## 2021-03-20 ENCOUNTER — Ambulatory Visit
Admission: RE | Admit: 2021-03-20 | Discharge: 2021-03-20 | Disposition: A | Discharge status: Home / Self Care | Payer: Medicare Other | Source: Ambulatory Visit | Attending: Family Medicine | Admitting: Family Medicine

## 2021-03-20 DIAGNOSIS — R911 Solitary pulmonary nodule: Secondary | ICD-10-CM | POA: Insufficient documentation

## 2021-03-20 DIAGNOSIS — I700 Atherosclerosis of aorta: Secondary | ICD-10-CM | POA: Diagnosis not present

## 2021-03-21 NOTE — Progress Notes (Signed)
Psychiatric Initial Adult Assessment   Patient Identification: MARTIANA KINDER MRN:  [id number] Date of Evaluation:  03/25/2021 Referral Source: [name], MD  Chief Complaint:  "My memory is gone." Visit Diagnosis:    ICD-10-CM   1. Moderate episode of recurrent major depressive disorder (HCC)  [id number] Basic metabolic panel    2. Cognitive decline  R41.89       History of Present Illness:   RAINY RUIZ is a 86 y.o. year old female with a history of depression, anxiety, CAD s/p PCI in 2014-10-16,  severe aortic stenosis, HFpEF,PSVT,  hypertension, hyperlipidemia, lacunar infarcts , who is referred for depression, anxiety and memory loss.   Noted that [place] providers only brief answers to the questions, and majority of the history is obtained by [name], patient caregiver/sister in [employer].  Tomasa Hosteller has been having worsening in depression and anxiety as below for the past year.  [name] notices some of her symptoms since her daughter passed away in 15-Oct-2016 from cancer.  Although Shanza thinks about her daughter at times, she denies significant impact from it.  Her husband deceased 7 years ago.  She reports good relationship with him.  Although she used to enjoy going gambling with him, she does not do it anymore as she is unable to get on airplanes.  She tends to sit during the day.  She does not do anything for fun anymore.  Although she used to contact her friends, who used to go to gambling together, she does not contact them anymore.  When she is asked about the reason, although she partly attributes this to not being able to drive on her own anymore, she also states that she does not have any motivation to do so either.  She also states that her memory is "gone."  Although she is unable to elaborate this, she has issues with IADL as below.   [name] states that Ameina has been independent, and never been an anxious person in the past. Although [name] agrees that Dilana is relatively  quit on today's visit, they have good time when they are together. Tomasa Hosteller is still able to clean the house, do laundry, and move around when necessary. Although she used to do well on citalopram, it was discontinued more than a year ago due to concern of hyponatremia. [name] denies any safety concern, wandering, or other behavioral issues.   Mini cog- 0/3 (unable to repeat words immediately after being instructed of words), clock drawing 0/3 (put numbers from 12, 1-11, and 1, one clock hand pointing from in between 1/11 to 10 when instructed to draw 11:10)  Memory- she has difficulty in using remote controller.  She needs to write down shopping list as she tends to be forgetful.  She does recall the names of her family members.  She does not have issues with wandering.   Medication- trazodone 100 mg at night (some benefit for initial insomnia)  Functional Status Instrumental Activities of Daily Living (IADLs):  AAIZA KALMAR is independent in the following: managing finances  Requires assistance with the following: medications, driving, cooking   Activities of Daily Living (ADLs):  PHENICIA BENNETTE is independent in the following: bathing and hygiene, feeding, continence, grooming and toileting, walking    Daily routine: watches TV, shopping at times Exercise: Employment: used to be self employed Support: Household: son stays overnight Marital status: widowed, her husband deceased in 10-15-08 from cancer.  Number of children: Total of 3. One living son. One  son and her daughter deceased. She has 3 great grandchildren    Head MRI 05/2020 Brain: Tiny right parietal foci of DWI hyperintensity (5:35). Remote left cerebellar and right thalamic micro hemorrhages. No midline shift, ventriculomegaly or extra-axial fluid collection. No mass lesion. Mild cerebral atrophy with ex vacuo dilatation. Chronic left thalamic lacunar insult. Mild chronic microvascular ischemic changes. Sequela of  bilateral anterior temporal insults.    MRA HEAD FINDINGS Anterior circulation: Patent ICAs. Moderate to severe narrowing of the distal left ICA cervical segment. Mild right supraclinoid ICA narrowing. Right A1 segment hypoplasia. Patent ACAs and MCAs. No aneurysm, or vascular malformation. Posterior circulation: Patent V4 segments. Mild narrowing of the mid right V4 segment and linear filling defect may reflect a web versus atheromatous plaque. Patent left PICA. Patent basilar and superior cerebellar arteries. Dominant right AICA. Patent posterior cerebral arteries. No significant stenosis, proximal occlusion, aneurysm, or  vascular malformation   IMPRESSION: MRI head:  Tiny right parietal acute/subacute infarcts. Chronic left thalamic lacunar insult. Mild cerebral atrophy and chronic microvascular ischemic changes. Sequela of remote anterior temporal insults.   MRA head: Moderate to severe distal left ICA cervical segment narrowing. Mild right supraclinoid ICA and mid right V4 segment narrowing.  Associated Signs/Symptoms: Depression Symptoms:  depressed mood, anhedonia, insomnia, fatigue, difficulty concentrating, anxiety, (Hypo) Manic Symptoms:   denies decreased need for sleep, euphoria Anxiety Symptoms:  Excessive Worry, Psychotic Symptoms:   denies AH, VH, paranoia PTSD Symptoms: Negative  Past Psychiatric History:  Outpatient: denies  Psychiatry admission:denies  Previous suicide attempt: denies Past trials of medication: citalopram (hyponatremia), mirtazapine, duloxetine, Buspar, Abilify, clonazepam (drowsiness) History of violence:    Previous Psychotropic Medications: Yes   Substance Abuse History in the last 12 months:  No.  Consequences of Substance Abuse: NA  Past Medical History:  Past Medical History:  Diagnosis Date   (HFpEF) heart failure with preserved ejection fraction ([place])    a. 08/2020 PET: Not suggestive of transthyretin amyloidosis; b. 11/2020  Echo: 60-65%, gr1 DD.   Abnormal mammogram 2007   Recommend close follow up   Anemia    Iron, b12, SPEP, UPEP normal, 12/2012   Anxiety    Aortic insufficiency    a. 06/2020 cMRI: mild AI.   Arthritis    Asthma    CAD (coronary artery disease)    a. 1996 s/p PTCA; b. 06/2011 Cath: LCX 50, RCA 100 CTO; c. 03/2015 PCI: LAD 70, LCX 77mw/ abnl FFR (3.0x12 Synergy & 2.5x8 Synergy DESs); d. 01/2018 Cath: stable anatomy, patent LCX stents; e. 04/2020 Cath: LM nl, LAD 40p, 673mD2 70ost, LCX 40ost, patent stents, RCA 70p/m, 100d. RPAV fills via R->R collats. RPL2 fills via L->R collats-->Med Rx.   Carotid arterial disease ([facility]   a. 05/2020 Carotid U/S: <50% bilat ICA stenoses.   Chronic cough    Depression    Diverticulosis    Dyspnea    Elevated LFTs 2010   s/p ultrasound w/ possible fatty liver; GI consult. Resolution 2011   Gastric ulcer    GERD (gastroesophageal reflux disease)    History of colonic polyps    [facility]    Hyperlipidemia    Hypertension    [facility]   Dr. ChOliva Bustard Dr. BrClaiborne Rigg/p chemo   PSVT (paroxysmal supraventricular tachycardia) ([facility]   a. 06/2020 Zio: 52 episodes of SVT (longest 59 secs; fastest 159 bpm).   Severe aortic stenosis  a. 05/2020 Cor CTA: Tricuspid AoV w/ sev reduced cusp separation and Ca2+; b. 06/2020 cMRI: Sev AS w/ mild AI. Mod MR. EF 69% w/ sev asymmetric LVH. Nl RV fxn; c. 11/2020 Echo: EF 60-65%, no rwma, Gr1 DD. Nl RV size/fxn. Mild MR. Mod-Sev AS (appears severe visually) AVA 0.94cm^2.   Shingles 2008   T-9 distribution   Syncope    a. 06/2020 Zio: 7 beats WCT (NSVT). 52 episodes of SVT (longest 59 secs, fastest 159 bpm).   Vitamin D deficiency    Wheezing     Past Surgical History:  Procedure Laterality Date   ABDOMINAL HYSTERECTOMY  2001   APPENDECTOMY     CARDIAC CATHETERIZATION  2007   50% mid LAD stenosis, 70% proximal RCA with 100% distal RCA stenosis with  collaterals, EF 65%   CARDIAC CATHETERIZATION N/A 04/27/2015   Procedure: Left Heart Cath and Coronary Angiography;  Surgeon: Jettie Booze, MD;  Location: [facility];  Service: Cardiovascular;  Laterality: N/A;   CARDIAC CATHETERIZATION N/A 04/27/2015   Procedure: Coronary Stent Intervention;  Surgeon: Jettie Booze, MD;  Location: [facility];  Service: Cardiovascular;  Laterality: N/A;   CATARACT EXTRACTION W/PHACO Left 09/25/2016   Procedure: CATARACT EXTRACTION PHACO AND INTRAOCULAR LENS PLACEMENT (IOC);  Surgeon: Eulogio Bear, MD;  Location: [facility];  Service: Ophthalmology;  Laterality: Left;  [place] 46.7 AP% 8.1 CDE 3.80 Fluid pack lot # [id number]   CATARACT EXTRACTION W/PHACO Right 11/06/2016   Procedure: CATARACT EXTRACTION PHACO AND INTRAOCULAR LENS PLACEMENT (IOC);  Surgeon: Eulogio Bear, MD;  Location: [facility];  Service: Ophthalmology;  Laterality: Right;  [place] 49.5 AP8.7 CDE4.28 LOT # [id number]   CHOLECYSTECTOMY  1987   COLECTOMY  2001   CORONARY ANGIOPLASTY  1996   CORONARY ANGIOPLASTY     LAPAROSCOPIC BILATERAL SALPINGO OOPHERECTOMY  1988   Dr. [place] CATH AND CORONARY ANGIOGRAPHY N/A 01/25/2018   Procedure: LEFT HEART CATH AND CORONARY ANGIOGRAPHY;  Surgeon: Wellington Hampshire, MD;  Location: [facility] [facility];  Service: Cardiovascular;  Laterality: N/A;   RIGHT/LEFT HEART CATH AND CORONARY ANGIOGRAPHY N/A 05/16/2020   Procedure: RIGHT/LEFT HEART CATH AND CORONARY ANGIOGRAPHY;  Surgeon: Wellington Hampshire, MD;  Location: [facility];  Service: Cardiovascular;  Laterality: N/A;   TONSILLECTOMY  1950    Family Psychiatric History: as below  Family History:  Family History  Problem Relation Age of Onset   Hypertension Mother    Stroke Mother 59       Cerebral Hemorrhage   Cancer Father        bone cancer   Diabetes Sister    Diabetes Brother    Heart disease Brother    Cancer Daughter 41       Ovarian cancer   Colon  cancer Neg Hx    Colon polyps Neg Hx     Social History:   Social History   Socioeconomic History   Marital status: Widowed    Spouse name: Not on file   Number of children: 3   Years of education: Not on file   Highest education level: Not on file  Occupational History   Occupation: [employer]    Comment: Retired   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 She is widowed and has a daughter [name]), son [name]). She also had a son that was killed in a work related accident [name] - died age).       Caffeine - none   Exercise - walking   Social Determinants of Health   Financial Resource Strain: Low Risk    Difficulty of Paying Living Expenses: Not hard at all  Food Insecurity: No Food Insecurity   Worried About Charity fundraiser in the Last Year: Never true   Ran Out of Food in the Last Year: Never true  Transportation Needs: No Transportation Needs   Lack of Transportation (Medical): No   Lack of Transportation (Non-Medical): No  Physical Activity: Not on file  Stress: No Stress Concern Present   Feeling of Stress : Not at all  Social Connections: Not on file    Additional Social History: as above  Allergies:   Allergies  Allergen Reactions   Abilify [Aripiprazole] Other (See Comments)    Burning in Chest Elevated b/p Insomnia   Celexa [Citalopram]     Caused low sodium level.    Ibuprofen Other (See Comments)    GIB    Metabolic Disorder Labs: Lab Results  Component Value Date   HGBA1C 6.0 (H) 06/06/2020   MPG 125.5 06/06/2020   No results found for: PROLACTIN Lab Results  Component Value Date   CHOL  95 06/06/2020   TRIG 68 06/06/2020   HDL 41 06/06/2020   CHOLHDL 2.3 06/06/2020   VLDL 14 06/06/2020   LDLCALC 40 06/06/2020   LDLCALC 20 09/13/2018   Lab Results  Component Value Date   TSH 2.509 06/06/2020    Therapeutic Level Labs: No results found for: LITHIUM No results found for: CBMZ No results found for: VALPROATE  Current Medications: Current Outpatient Medications  Medication Sig Dispense Refill   acetaminophen (TYLENOL) 500 MG tablet Take 1,000 mg by mouth in the morning and at bedtime.     Ascorbic Acid (VITAMIN C PO) Take by mouth.     aspirin 81 MG tablet Take 81 mg by mouth daily.     atorvastatin (LIPITOR) 40 MG tablet Take 1 tablet (40 mg total) by mouth daily. 90 tablet 3   Calcium Carbonate-Vit D-Min (CALCIUM 1200 PO) Take 1,200 mg by mouth daily.     Cholecalciferol 5000 units TABS Take 5,000 Units by mouth daily.     colestipol (COLESTID) 1 g tablet TAKE 1 TABLET(1 GRAM) BY MOUTH DAILY 90 tablet 1   isosorbide mononitrate (IMDUR) 30 MG 24 hr tablet TAKE 1 AND 1/2 TABLETS(45 MG) BY MOUTH DAILY 135 tablet 1   MAGNESIUM-OXIDE 400 (240 Mg) MG tablet Take 1 tablet (400 mg total) by mouth 3 (three) times daily. 270 tablet 2   metoprolol tartrate (LOPRESSOR) 50 MG tablet TAKE 1 TABLET(50 MG) BY MOUTH TWICE DAILY 180 tablet 1   Multiple Vitamin (MULTIVITAMIN) capsule Take 1 capsule by mouth daily.     nitroGLYCERIN (NITROSTAT) 0.4 MG SL tablet PLACE 1 TABLET UNDER THE TONGUE EVERY 5 MINUTES AS NEEDED FOR CHEST PAIN, 3 DOSES MAX 25 tablet 1   Nutritional Supplements (OSTEO ADVANCE) TABS Take 1 tablet by mouth daily.      omega-3 acid ethyl esters (LOVAZA) 1  g capsule TAKE 2 CAPSULES BY MOUTH TWICE DAILY 360 capsule 1   pantoprazole (PROTONIX) 40 MG tablet TAKE 1 TABLET(40 MG) BY MOUTH DAILY 90 tablet 1   potassium chloride SA (KLOR-CON) 20 MEQ tablet Take 1 tablet (20 mEq total) by mouth daily. 90 tablet 3   traZODone (DESYREL) 50 MG tablet Take 0.5-1 tablets (25-50  mg total) by mouth at bedtime. 30 tablet 3   Ubiquinol 100 MG CAPS Take 100 mg by mouth every morning.     vitamin B-12 (CYANOCOBALAMIN) 250 MCG tablet Take 250 mcg by mouth daily.     alendronate (FOSAMAX) 70 MG tablet Take 1 tablet (70 mg total) by mouth every 7 (seven) days. Take with a full glass of water on an empty stomach. (Patient not taking: Reported on 03/25/2021) 4 tablet 11   clopidogrel (PLAVIX) 75 MG tablet Take 75 mg by mouth daily. (Patient not taking: Reported on 03/25/2021)     No current facility-administered medications for this visit.   Facility-Administered Medications Ordered in Other Visits  Medication Dose Route Frequency Provider Last Rate Last Admin   sodium chloride 0.9 % nebulizer solution 3 mL  3 mL Nebulization Once [name], MD       Followed by   methacholine (PROVOCHOLINE) inhaler solution 0.125 mg  2 mL Inhalation Once [name], MD       Followed by   methacholine (PROVOCHOLINE) inhaler solution 0.5 mg  2 mL Inhalation Once [name], MD       Followed by   methacholine (PROVOCHOLINE) inhaler solution 2 mg  2 mL Inhalation Once [name], MD       Followed by   methacholine (PROVOCHOLINE) inhaler solution 8 mg  2 mL Inhalation Once [name], MD       Followed by   methacholine (PROVOCHOLINE) inhaler solution 32 mg  2 mL Inhalation Once [name], MD        Musculoskeletal: Strength & Muscle Tone: decreased Gait & Station:  slow Patient leans: N/A  Psychiatric Specialty Exam: Review of Systems  Psychiatric/Behavioral:  Positive for decreased concentration, dysphoric mood and sleep disturbance. Negative for agitation, behavioral problems, confusion, hallucinations, self-injury and suicidal ideas. The patient is nervous/anxious. The patient is not hyperactive.   All other systems reviewed and are negative.  Blood pressure (!) 164/81, pulse 75, temperature 97.6 F (36.4 C), height 4' 10.75" (1.492  m), weight 116 lb 6.4 oz (52.8 kg), SpO2 98 %.Body mass index is 23.71 kg/m.  General Appearance: Fairly Groomed  Eye Contact:  Good  Speech:  Normal Rate and provides only brief sentence  Volume:  Normal  Mood:  Depressed  Affect:  Appropriate, Congruent, and slightly restricted  Thought Process:  Coherent  Orientation:  Full (Time, Place, and Person)  Thought Content:  Logical  Suicidal Thoughts:  No  Homicidal Thoughts:  No  Memory:  Immediate;   Poor  Judgement:  Fair  Insight:  Present  Psychomotor Activity:  Normal  Concentration:  Concentration: Fair and Attention Span: Fair  Recall:  Poor  Fund of Knowledge:Good  Language: Good  Akathisia:  No  Handed:  Right  AIMS (if indicated):  not done  Assets:  Communication Skills Desire for Improvement  ADL's:  Intact  Cognition: WNL  Sleep:  Poor   Screenings: GAD-7    Flowsheet Row Office Visit from 02/08/2018 in [place]  Total GAD-7 Score 0  [place] Office Visit from 04/24/2020 in [place] from 03/05/2018 in [place] from 03/04/2017 in [place] from 03/04/2016 in [facility]  Total Score (max 30 points ) '28 28 30 29      '$ PHQ2-9    [place] Visit from 03/25/2021 in [place] from 11/22/2020 in [facility] Office Visit from 08/20/2020 in [facility] Office Visit from 04/24/2020 in [place] from 02/10/2020 in [place]  PHQ-2 Total Score 2 0 0 3 6  PHQ-9 Total Score 17 -- -- 4 12      [facility] [facility] to Hosp-Admission (Discharged) from 12/05/2020 in [place] [facility] from 09/11/2020 in [facility] ED from 08/18/2020 in [place] No Risk No Risk No Risk       Assessment and Plan:  TAJANAE SARE is a 86 y.o. year old female with a history of depression, anxiety, CAD s/p PCI in 2016,  severe aortic stenosis, HFpEF,PSVT,  hypertension, hyperlipidemia, lacunar infarcts , who is referred for depression, anxiety and memory loss.   1. Moderate episode of recurrent major depressive disorder (HCC) Exam is notable for slightly restricted affect, and she reports worsening in depressive symptoms over the past year.  Psychosocial stressors includes loss of her daughter few years ago from cancer.  She is a widow, and lost her son several years ago as well.  She has good support from her son, and her sister in law/friend/caregiver.  She has tried several antidepressant with limited benefit/adverse reaction including hyponatremia in the past.  Although [place] was discussed, the caregiver does not think it will be feasible given her physical condition.  Will try lowest dose of bupropion to target depression after rechecking a sodium level.  Discussed potential risk of hyponatremia, interaction with Lopressor, and palpitation.  She has no known history of seizure.   2. Cognitive decline Exam is notable for short-term/long-term memory loss, and she has cognitive deficits as evidenced by mini cog.  Etiology is multifactorial, which includes lacunar infarct.  Will not start cholinesterase inhibitor at this time given its potential cardiac side effect.The caregiver agrees to first work on treatment for depression.  The caregiver is not interested in neuropsychological testing at this time.    Plan Obtain blood test (BMP) Start bupropion 75 mg daily after confirming sodium level/if no hyponatremia Next appointment: 10/17 at 3:30, in person - she is on trazodone 100 mg at night for insomnia, prescribed by PCP  The patient demonstrates the following risk factors for suicide:  Chronic risk factors for suicide include: psychiatric disorder of depression . Acute risk factors for suicide include: unemployment and loss (financial, interpersonal, professional). Protective factors for this patient include: positive social support and hope for the future. Considering these factors, the overall suicide risk at this point appears to be low. Patient is appropriate for outpatient follow up.   Norman Clay, MD 8/29/202211:25 AM

## 2021-03-25 ENCOUNTER — Ambulatory Visit (INDEPENDENT_AMBULATORY_CARE_PROVIDER_SITE_OTHER): Payer: Medicare Other | Admitting: Psychiatry

## 2021-03-25 ENCOUNTER — Other Ambulatory Visit
Admission: RE | Admit: 2021-03-25 | Discharge: 2021-03-25 | Disposition: A | Discharge status: Home / Self Care | Payer: Medicare Other | Source: Ambulatory Visit | Attending: Psychiatry | Admitting: Psychiatry

## 2021-03-25 ENCOUNTER — Other Ambulatory Visit: Payer: Self-pay

## 2021-03-25 ENCOUNTER — Encounter: Payer: Self-pay | Admitting: Psychiatry

## 2021-03-25 VITALS — BP 164/81 | HR 75 | Temp 97.6°F | Ht 58.75 in | Wt 116.4 lb

## 2021-03-25 DIAGNOSIS — Z5181 Encounter for therapeutic drug level monitoring: Secondary | ICD-10-CM | POA: Diagnosis not present

## 2021-03-25 DIAGNOSIS — F331 Major depressive disorder, recurrent, moderate: Secondary | ICD-10-CM | POA: Diagnosis not present

## 2021-03-25 DIAGNOSIS — R4189 Other symptoms and signs involving cognitive functions and awareness: Secondary | ICD-10-CM

## 2021-03-25 LAB — BASIC METABOLIC PANEL
Anion gap: 11 (ref 5–15)
BUN: 13 mg/dL (ref 8–23)
CO2: 25 mmol/L (ref 22–32)
Calcium: 9.5 mg/dL (ref 8.9–10.3)
Chloride: 94 mmol/L — ABNORMAL LOW (ref 98–111)
Creatinine, Ser: 0.66 mg/dL (ref 0.44–1.00)
GFR, Estimated: >60 mL/min (ref >60)
Glucose, Bld: 124 mg/dL — ABNORMAL HIGH (ref 70–99)
Potassium: 4.7 mmol/L (ref 3.5–5.1)
Sodium: 130 mmol/L — ABNORMAL LOW (ref 135–145)

## 2021-03-25 NOTE — Patient Instructions (Addendum)
Obtain blood test (BMP) Start bupropion 75 mg daily after comfirming sodium level Next appointment: 10/17 at 3:30, in person  The next visit will be in person visit. Please arrive 15 mins before the scheduled time.   [facility]  Address: [address], [place], [place] [address]

## 2021-03-28 ENCOUNTER — Encounter: Payer: Self-pay | Admitting: Family Medicine

## 2021-03-28 DIAGNOSIS — F419 Anxiety disorder, unspecified: Secondary | ICD-10-CM

## 2021-03-28 MED ORDER — TRAZODONE HCL 50 MG PO TABS: 100.0000 mg | ORAL_TABLET | Freq: Every day | ORAL | 3 refills | Status: DC

## 2021-04-02 ENCOUNTER — Emergency Department
Admission: EM | Admit: 2021-04-02 | Discharge: 2021-04-02 | Disposition: A | Discharge status: Home / Self Care | Payer: Medicare Other | Attending: Emergency Medicine | Admitting: Emergency Medicine

## 2021-04-02 ENCOUNTER — Other Ambulatory Visit: Payer: Self-pay | Admitting: Psychiatry

## 2021-04-02 ENCOUNTER — Emergency Department: Payer: Medicare Other

## 2021-04-02 ENCOUNTER — Telehealth: Payer: Self-pay

## 2021-04-02 DIAGNOSIS — R519 Headache, unspecified: Secondary | ICD-10-CM | POA: Diagnosis not present

## 2021-04-02 DIAGNOSIS — J449 Chronic obstructive pulmonary disease, unspecified: Secondary | ICD-10-CM | POA: Insufficient documentation

## 2021-04-02 DIAGNOSIS — I10 Essential (primary) hypertension: Secondary | ICD-10-CM | POA: Insufficient documentation

## 2021-04-02 DIAGNOSIS — Z79899 Other long term (current) drug therapy: Secondary | ICD-10-CM | POA: Insufficient documentation

## 2021-04-02 DIAGNOSIS — R531 Weakness: Secondary | ICD-10-CM | POA: Diagnosis present

## 2021-04-02 DIAGNOSIS — J45909 Unspecified asthma, uncomplicated: Secondary | ICD-10-CM | POA: Insufficient documentation

## 2021-04-02 DIAGNOSIS — Z7982 Long term (current) use of aspirin: Secondary | ICD-10-CM | POA: Diagnosis not present

## 2021-04-02 DIAGNOSIS — I2510 Atherosclerotic heart disease of native coronary artery without angina pectoris: Secondary | ICD-10-CM | POA: Insufficient documentation

## 2021-04-02 LAB — CBC WITH DIFFERENTIAL/PLATELET
Abs Immature Granulocytes: 0.02 10*3/uL (ref 0.00–0.07)
Basophils Absolute: 0 10*3/uL (ref 0.0–0.1)
Basophils Relative: 0 %
Eosinophils Absolute: 0.1 10*3/uL (ref 0.0–0.5)
Eosinophils Relative: 1 %
HCT: 33.8 % — ABNORMAL LOW (ref 36.0–46.0)
Hemoglobin: 12.6 g/dL (ref 12.0–15.0)
Immature Granulocytes: 0 %
Lymphocytes Relative: 25 %
Lymphs Abs: 1.8 10*3/uL (ref 0.7–4.0)
MCH: 33.4 pg (ref 26.0–34.0)
MCHC: 37.3 g/dL — ABNORMAL HIGH (ref 30.0–36.0)
MCV: 89.7 fL (ref 80.0–100.0)
Monocytes Absolute: 0.5 10*3/uL (ref 0.1–1.0)
Monocytes Relative: 6 %
Neutro Abs: 4.8 10*3/uL (ref 1.7–7.7)
Neutrophils Relative %: 68 %
Platelets: 200 10*3/uL (ref 150–400)
RBC: 3.77 MIL/uL — ABNORMAL LOW (ref 3.87–5.11)
RDW: 11.4 % — ABNORMAL LOW (ref 11.5–15.5)
WBC: 7.1 10*3/uL (ref 4.0–10.5)
nRBC: 0 % (ref 0.0–0.2)

## 2021-04-02 LAB — BASIC METABOLIC PANEL
Anion gap: 13 (ref 5–15)
BUN: 11 mg/dL (ref 8–23)
CO2: 23 mmol/L (ref 22–32)
Calcium: 9.9 mg/dL (ref 8.9–10.3)
Chloride: 92 mmol/L — ABNORMAL LOW (ref 98–111)
Creatinine, Ser: 0.51 mg/dL (ref 0.44–1.00)
GFR, Estimated: >60 mL/min (ref >60)
Glucose, Bld: 125 mg/dL — ABNORMAL HIGH (ref 70–99)
Potassium: 3.7 mmol/L (ref 3.5–5.1)
Sodium: 128 mmol/L — ABNORMAL LOW (ref 135–145)

## 2021-04-02 LAB — TROPONIN I (HIGH SENSITIVITY)
Troponin I (High Sensitivity): 25 ng/L — ABNORMAL HIGH (ref <18)
Troponin I (High Sensitivity): 32 ng/L — ABNORMAL HIGH (ref <18)

## 2021-04-02 MED ORDER — CLONIDINE HCL 0.1 MG PO TABS
0.1000 mg | ORAL_TABLET | Freq: Once | ORAL | Status: AC
  Administered 2021-04-02: 0.1 mg via ORAL
  Filled 2021-04-02: qty 1

## 2021-04-02 MED ORDER — METOPROLOL TARTRATE 50 MG PO TABS
50.0000 mg | ORAL_TABLET | Freq: Once | ORAL | Status: AC
  Administered 2021-04-02: 50 mg via ORAL
  Filled 2021-04-02: qty 1

## 2021-04-02 MED ORDER — ISOSORBIDE MONONITRATE ER 30 MG PO TB24: 60.0000 mg | ORAL_TABLET | Freq: Every day | ORAL | 0 refills | Status: DC

## 2021-04-02 NOTE — Telephone Encounter (Signed)
Medication management - Call from patient's emergency contact, Ms. Alveta Heimlich who wanted to know if Dr. Modesta Messing was going to start patient on something else for her depressive mood since patient's Sodium level and BP issues still present. Agreed to send Dr. Modesta Messing a message with this concern.

## 2021-04-02 NOTE — Discharge Instructions (Signed)
FOR YOUR BLOOD PRESSURE  Increase your Imdur to 60 mg daily (2 tablets)  You can start this tomorrow morning  Call Cardiology in the morning to discuss your Er visit and to arrange follow-up  IF blood pressure remains elevated, above 160/80s consistently, call your doctor  IF blood pressure spikes again to 200/100+ and does not resolve with reassurance, you can take one sublingual nitroglycerin and relax to see if this will help

## 2021-04-02 NOTE — ED Triage Notes (Signed)
Pt presents to ED with c/o of hypertension (192/99). Sister states pt hasn't been feeling well. This RN contacted sister via phone per request. Pt states slight headache. Pt states "I just don't feel right I feel crazy in my head". Pt is A&Ox4.   Pt states pt took all morning and afternoon BP pills today. NAD noted at this time.

## 2021-04-02 NOTE — Progress Notes (Signed)
Hi Dr. Caryl Bis,   Thank you so much for reaching out to me. I think it is fine to come off Trazodone to see if hyponatremia improves. At the same time, I am curious to know if she would be a good candidate to start treatment to raise sodium if she truly has SIADH/if sodium level does not change despite discontinuation of these medication. I believe she will greatly benefit from antidepressant. I am hoping to start med if her sodium level remains in a good range, although I am sure we need to do this judiciously.   Thank you so much for your referral as well.  [place]

## 2021-04-02 NOTE — Telephone Encounter (Signed)
Discussed with her sister, [name].   [name] is notified that will hold antidepressant at this time given her current sodium level. Discussed treatment option of [place], which does not have a risk of hyponatremia.  If she is interested, will plan to make referral to the place in [place].  Of note, [name] asks if Deshana can be prescribed as needed medication given she has been upset. Thembi was noted to have BP of 224/93, followed by 180/83. [name] is advised to contact her PCP for further evaluation/guidance especially given her cardiac condition.

## 2021-04-02 NOTE — ED Provider Notes (Signed)
[facility] Emergency Department Provider Note  ____________________________________________   Event Date/Time   First MD Initiated Contact with Patient 04/02/21 1955     (approximate)  I have reviewed the triage vital signs and the nursing notes.   HISTORY  Chief Complaint Hypertension    HPI Amy Morton is a 86 y.o. female here with generalized weakness.  The patient states that she does not Nestl remember what happened as she has some mild memory issues.  I called Amy Morton, the sister-in-law.  Patient reportedly was not necessarily feeling very well.  She was complaining that she felt a little "swimmy headed."  She told her son, and he checked her blood pressure and it was significantly elevated.  Her friend tried to come calm her down and it remained elevated so she presents for evaluation.  Denies any chest pain.  Denies any current headache.  She otherwise states she feels "fine."  Denies any recent medication changes.  She does state that she has chronic anxiety and this has been slightly worse recently.  She has been taking her meds as prescribed.  No other changes.     Past Medical History:  Diagnosis Date   (HFpEF) heart failure with preserved ejection fraction ([place])    a. 08/2020 PET: Not suggestive of transthyretin amyloidosis; b. 11/2020 Echo: 60-65%, gr1 DD.   Abnormal mammogram 2007   Recommend close follow up   Anemia    Iron, b12, SPEP, UPEP normal, 12/2012   Anxiety    Aortic insufficiency    a. 06/2020 cMRI: mild AI.   Arthritis    Asthma    CAD (coronary artery disease)    a. 1996 s/p PTCA; b. 06/2011 Cath: LCX 50, RCA 100 CTO; c. 03/2015 PCI: LAD 70, LCX 9mw/ abnl FFR (3.0x12 Synergy & 2.5x8 Synergy DESs); d. 01/2018 Cath: stable anatomy, patent LCX stents; e. 04/2020 Cath: LM nl, LAD 40p, 651mD2 70ost, LCX 40ost, patent stents, RCA 70p/m, 100d. RPAV fills via R->R collats. RPL2 fills via L->R collats-->Med Rx.   Carotid  arterial disease ([facility]   a. 05/2020 Carotid U/S: <50% bilat ICA stenoses.   Chronic cough    Depression    Diverticulosis    Dyspnea    Elevated LFTs 2010   s/p ultrasound w/ possible fatty liver; GI consult. Resolution 2011   Gastric ulcer    GERD (gastroesophageal reflux disease)    History of colonic polyps    [facility]    Hyperlipidemia    Hypertension    [facility]   Dr. ChOliva Bustard Dr. BrClaiborne Rigg/p chemo   PSVT (paroxysmal supraventricular tachycardia) ([place]   a. 06/2020 Zio: 52 episodes of SVT (longest 59 secs; fastest 159 bpm).   Severe aortic stenosis    a. 05/2020 Cor CTA: Tricuspid AoV w/ sev reduced cusp separation and Ca2+; b. 06/2020 cMRI: Sev AS w/ mild AI. Mod MR. EF 69% w/ sev asymmetric LVH. Nl RV fxn; c. 11/2020 Echo: EF 60-65%, no rwma, Gr1 DD. Nl RV size/fxn. Mild MR. Mod-Sev AS (appears severe visually) AVA 0.94cm^2.   Shingles 2008   T-9 distribution   Syncope    a. 06/2020 Zio: 7 beats WCT (NSVT). 52 episodes of SVT (longest 59 secs, fastest 159 bpm).   Vitamin D deficiency    Wheezing     Patient Active Problem List   Diagnosis Date Noted   Osteoporosis 03/04/2021  Lung nodule 01/10/2021   Elevated troponin 12/05/2020   Compression fracture of T3 vertebra ([place]) 12/05/2020   Low back pain 11/22/2020   Heart valve disease 08/23/2020   Head injury 08/20/2020   History of CVA (cerebrovascular accident)    Hot flashes 02/10/2020   Asthma without status asthmaticus 12/15/2019   Essential hypertension 12/15/2019   Prediabetes 02/11/2019   Memory difficulty 02/08/2018   Angina pectoris ([place]) 01/22/2018   Non-rheumatic mitral regurgitation 02/12/2017   Orthostasis 10/20/2016   Arthritis 05/22/2016   COPD (chronic obstructive pulmonary disease) ([place]) 11/21/2015   Hyponatremia 09/30/2015   CAD (coronary artery disease) 04/27/2015   Complex tear of medial meniscus of right knee as current injury  07/26/2014   Primary osteoarthritis of right knee 07/26/2014   Anemia 05/12/2014   Severe aortic stenosis 04/18/2014   History of peritoneal carcinoma 04/17/2014   Chronic diarrhea 04/17/2014   Hyperlipidemia 04/17/2014   Anxiety and depression 11/13/2013   Vitamin D deficiency 11/13/2013    Past Surgical History:  Procedure Laterality Date   ABDOMINAL HYSTERECTOMY  2001   APPENDECTOMY     CARDIAC CATHETERIZATION  2007   50% mid LAD stenosis, 70% proximal RCA with 100% distal RCA stenosis with collaterals, EF 65%   CARDIAC CATHETERIZATION N/A 04/27/2015   Procedure: Left Heart Cath and Coronary Angiography;  Surgeon: Jettie Booze, MD;  Location: [place] [facility];  Service: Cardiovascular;  Laterality: N/A;   CARDIAC CATHETERIZATION N/A 04/27/2015   Procedure: Coronary Stent Intervention;  Surgeon: Jettie Booze, MD;  Location: [place] [facility];  Service: Cardiovascular;  Laterality: N/A;   CATARACT EXTRACTION W/PHACO Left 09/25/2016   Procedure: CATARACT EXTRACTION PHACO AND INTRAOCULAR LENS PLACEMENT (IOC);  Surgeon: Eulogio Bear, MD;  Location: [facility] ORS;  Service: Ophthalmology;  Laterality: Left;  [place] 46.7 AP% 8.1 CDE 3.80 Fluid pack lot # [id number]   CATARACT EXTRACTION W/PHACO Right 11/06/2016   Procedure: CATARACT EXTRACTION PHACO AND INTRAOCULAR LENS PLACEMENT (IOC);  Surgeon: Eulogio Bear, MD;  Location: [facility] OR[facility];  Service: Ophthalmology;  Laterality: Right;  [place] 49.5 AP8.7 CDE4.28 LOT # [id number]   CHOLECYSTECTOMY  1987   COLECTOMY  2001   CORONARY ANGIOPLASTY  1996   CORONARY ANGIOPLASTY     LAPAROSCOPIC BILATERAL SALPINGO OOPHERECTOMY  1988   Dr. Cody CATH AND CORONARY ANGIOGRAPHY N/A 01/25/2018   Procedure: LEFT HEART CATH AND CORONARY ANGIOGRAPHY;  Surgeon: Wellington Hampshire, MD;  Location: [place] [facility];  Service: Cardiovascular;  Laterality: N/A;   RIGHT/LEFT HEART CATH AND CORONARY ANGIOGRAPHY N/A 05/16/2020   Procedure:  RIGHT/LEFT HEART CATH AND CORONARY ANGIOGRAPHY;  Surgeon: Wellington Hampshire, MD;  Location: [place] [facility];  Service: Cardiovascular;  Laterality: N/A;   TONSILLECTOMY  1950    Prior to Admission medications   Medication Sig Start Date End Date Taking? Authorizing Provider  acetaminophen (TYLENOL) 500 MG tablet Take 1,000 mg by mouth in the morning and at bedtime.    [provider]  alendronate (FOSAMAX) 70 MG tablet Take 1 tablet (70 mg total) by mouth every 7 (seven) days. Take with a full glass of water on an empty stomach. Patient not taking: Reported on 03/25/2021 03/07/21   [name], MD  Ascorbic Acid (VITAMIN C PO) Take by mouth.    [provider]  aspirin 81 MG tablet Take 81 mg by mouth daily.    [provider]  atorvastatin (LIPITOR) 40 MG tablet  Take 1 tablet (40 mg total) by mouth daily. 07/18/20 07/13/21  Loel Dubonnet, NP  Calcium Carbonate-Vit D-Min (CALCIUM 1200 PO) Take 1,200 mg by mouth daily.    [provider]  Cholecalciferol 5000 units TABS Take 5,000 Units by mouth daily.    [provider]  clopidogrel (PLAVIX) 75 MG tablet Take 75 mg by mouth daily. Patient not taking: Reported on 03/25/2021 03/01/21   [provider]  colestipol (COLESTID) 1 g tablet TAKE 1 TABLET(1 GRAM) BY MOUTH DAILY 11/07/20   [name], MD  isosorbide mononitrate (IMDUR) 30 MG 24 hr tablet Take 2 tablets (60 mg total) by mouth daily. 04/02/21 05/02/21  Duffy Bruce, MD  metoprolol tartrate (LOPRESSOR) 50 MG tablet TAKE 1 TABLET(50 MG) BY MOUTH TWICE DAILY 12/03/20   Wellington Hampshire, MD  Multiple Vitamin (MULTIVITAMIN) capsule Take 1 capsule by mouth daily.    [provider]  nitroGLYCERIN (NITROSTAT) 0.4 MG SL tablet PLACE 1 TABLET UNDER THE TONGUE EVERY 5 MINUTES AS NEEDED FOR CHEST PAIN, 3 DOSES MAX 11/24/19   Wellington Hampshire, MD  Nutritional Supplements (OSTEO ADVANCE) TABS Take 1 tablet by mouth daily.      [provider]  omega-3 acid ethyl esters (LOVAZA) 1 g capsule TAKE 2 CAPSULES BY MOUTH TWICE DAILY 12/13/20   [name], MD  pantoprazole (PROTONIX) 40 MG tablet TAKE 1 TABLET(40 MG) BY MOUTH DAILY 12/03/20   Wellington Hampshire, MD  potassium chloride SA (KLOR-CON) 20 MEQ tablet Take 1 tablet (20 mEq total) by mouth daily. 01/02/21   Theora Gianotti, NP  traZODone (DESYREL) 50 MG tablet Take 2 tablets (100 mg total) by mouth at bedtime. 03/28/21   [name], MD  Ubiquinol 100 MG CAPS Take 100 mg by mouth every morning.    [provider]  vitamin B-12 (CYANOCOBALAMIN) 250 MCG tablet Take 250 mcg by mouth daily.    [provider]    Allergies Abilify [aripiprazole], Celexa [citalopram], and Ibuprofen  Family History  Problem Relation Age of Onset   Hypertension Mother    Stroke Mother 26       Cerebral Hemorrhage   Cancer Father        bone cancer   Diabetes Sister    Diabetes Brother    Heart disease Brother    Cancer Daughter 71       Ovarian cancer   Colon cancer Neg Hx    Colon polyps Neg Hx     Social History Social History   Tobacco Use   Smoking status: Never   Smokeless tobacco: Never  Vaping Use   Vaping Use: Never used  Substance Use Topics   Alcohol use: No    Alcohol/week: 0.0 standard drinks   Drug use: No    Review of Systems  Review of Systems  Constitutional:  Positive for fatigue. Negative for fever.  HENT:  Negative for congestion and sore throat.   Eyes:  Negative for visual disturbance.  Respiratory:  Negative for cough and shortness of breath.   Cardiovascular:  Negative for chest pain.  Gastrointestinal:  Negative for abdominal pain, diarrhea, nausea and vomiting.  Genitourinary:  Negative for flank pain.  Musculoskeletal:  Negative for back pain and neck pain.  Skin:  Negative for rash and wound.  Neurological:  Negative for weakness.  All other systems reviewed and are negative.    ____________________________________________  PHYSICAL EXAM:      VITAL SIGNS: ED  Triage Vitals [04/02/21 1842]  Enc Vitals Group     BP (!) 192/99     Pulse Rate (!) 107     Resp 20     Temp 98.6 F (37 C)     Temp Source Oral     SpO2 95 %     Weight      Height      Head Circumference      Peak Flow      Pain Score      Pain Loc      Pain Edu?      Excl. in [place]?      Physical Exam Vitals and nursing note reviewed.  Constitutional:      General: She is not in acute distress.    Appearance: She is well-developed.  HENT:     Head: Normocephalic and atraumatic.  Eyes:     Conjunctiva/sclera: Conjunctivae normal.  Cardiovascular:     Rate and Rhythm: Normal rate and regular rhythm.     Heart sounds: Normal heart sounds. No murmur heard.   No friction rub.  Pulmonary:     Effort: Pulmonary effort is normal. No respiratory distress.     Breath sounds: Normal breath sounds. No wheezing or rales.  Abdominal:     General: There is no distension.     Palpations: Abdomen is soft.     Tenderness: There is no abdominal tenderness.  Musculoskeletal:     Cervical back: Neck supple.  Skin:    General: Skin is warm.     Capillary Refill: Capillary refill takes less than 2 seconds.  Neurological:     Mental Status: She is alert and oriented to person, place, and time.     Motor: No abnormal muscle tone.      ____________________________________________   LABS (all labs ordered are listed, but only abnormal results are displayed)  Labs Reviewed  CBC WITH DIFFERENTIAL/PLATELET - Abnormal; Notable for the following components:      Result Value   RBC 3.77 (*)    HCT 33.8 (*)    MCHC 37.3 (*)    RDW 11.4 (*)    All other components within normal limits  BASIC METABOLIC PANEL - Abnormal; Notable for the following components:   Sodium 128 (*)    Chloride 92 (*)    Glucose, Bld 125 (*)    All other components within normal limits  TROPONIN I (HIGH SENSITIVITY) -  Abnormal; Notable for the following components:   Troponin I (High Sensitivity) 25 (*)    All other components within normal limits  TROPONIN I (HIGH SENSITIVITY)    ____________________________________________  EKG: Sinus tachycardia with PACs.  Ventricular rate 109.  PR 160, QRS 90, QTc 447.  No acute ST elevations or depressions.  Moderate voltage criteria for LVH. ________________________________________  RADIOLOGY All imaging, including plain films, CT scans, and ultrasounds, independently reviewed by me, and interpretations confirmed via formal radiology reads.  ED MD interpretation:   CT head negative  Official radiology report(s): CT HEAD WO CONTRAST (5MM)  Result Date: 04/02/2021 CLINICAL DATA:  Headache.  Hypertension. EXAM: CT HEAD WITHOUT CONTRAST TECHNIQUE: Contiguous axial images were obtained from the base of the skull through the vertex without intravenous contrast. COMPARISON:  Head CT 12/05/2020 FINDINGS: Brain: Brain volume is normal for age. No intracranial hemorrhage, mass effect, or midline shift. No hydrocephalus. The basilar cisterns are patent. Minimal periventricular chronic small vessel ischemia. No evidence of territorial infarct or acute ischemia.  No extra-axial or intracranial fluid collection. Vascular: Atherosclerosis of skullbase vasculature without hyperdense vessel or abnormal calcification. Skull: No fracture or focal lesion. Sinuses/Orbits: Chronic mucosal thickening and mucous retention cysts in the sphenoid sinus. No acute findings. Bilateral cataract resection. Mastoid air cells are clear. Other: None. IMPRESSION: No acute intracranial abnormality. Electronically Signed   By: Keith Rake M.D.   On: 04/02/2021 19:26    ____________________________________________  PROCEDURES   Procedure(s) performed (including Critical Care):  Procedures  ____________________________________________  INITIAL IMPRESSION / MDM / [place] / ED  COURSE  As part of my medical decision making, I reviewed the following data within the [place] notes reviewed and incorporated, Old chart reviewed, Notes from prior ED visits, and [place] Controlled Substance Database       *Amy Morton was evaluated in Emergency Department on 04/02/2021 for the symptoms described in the history of present illness. She was evaluated in the context of the global COVID-19 pandemic, which necessitated consideration that the patient might be at risk for infection with the SARS-CoV-2 virus that causes COVID-19. Institutional protocols and algorithms that pertain to the evaluation of patients at risk for COVID-19 are in a state of rapid change based on information released by regulatory bodies including the CDC and federal and state organizations. These policies and algorithms were followed during the patient's care in the ED.  Some ED evaluations and interventions may be delayed as a result of limited staffing during the pandemic.*     Medical Decision Making: 86 year old female here with past medical history of anxiety, hypertension, here with transient lightheadedness, now resolved.  CT head is negative.  The patient is alert, oriented, with no signs of hypertensive encephalopathy.  EKG is nonischemic.  Troponin is negative.  Patient appears overall very well and in no distress.  Suspect mild symptomatic hypertension that seems to now be resolved.  She was given clonidine as well as her home metoprolol.  This does occur in the setting of recent stressors which I suspect are exacerbating her hypertension.  Patient remains afebrile and otherwise nontoxic.  Discussed with her family.  Will have her increase her Imdur as she has a history of aortic stenosis and this should help with this, as well as call her cardiologist.  Otherwise, no emergent medical condition identified.  ____________________________________________  FINAL CLINICAL  IMPRESSION(S) / ED DIAGNOSES  Final diagnoses:  Primary hypertension  Essential hypertension     MEDICATIONS GIVEN DURING THIS VISIT:  Medications  cloNIDine (CATAPRES) tablet 0.1 mg (0.1 mg Oral Given 04/02/21 2021)  metoprolol tartrate (LOPRESSOR) tablet 50 mg (50 mg Oral Given 04/02/21 2121)  cloNIDine (CATAPRES) tablet 0.1 mg (0.1 mg Oral Given 04/02/21 2121)     ED Discharge Orders          Ordered    isosorbide mononitrate (IMDUR) 30 MG 24 hr tablet  Daily        04/02/21 2134             Note:  This document was prepared using Dragon voice recognition software and may include unintentional dictation errors.   Duffy Bruce, MD 04/02/21 2208

## 2021-04-08 ENCOUNTER — Ambulatory Visit (INDEPENDENT_AMBULATORY_CARE_PROVIDER_SITE_OTHER): Payer: Medicare Other | Admitting: Family Medicine

## 2021-04-08 ENCOUNTER — Encounter: Payer: Self-pay | Admitting: Family Medicine

## 2021-04-08 ENCOUNTER — Other Ambulatory Visit: Payer: Self-pay

## 2021-04-08 VITALS — BP 160/82 | HR 68 | Ht 58.74 in | Wt 114.8 lb

## 2021-04-08 DIAGNOSIS — Z9861 Coronary angioplasty status: Secondary | ICD-10-CM | POA: Diagnosis not present

## 2021-04-08 DIAGNOSIS — F32A Depression, unspecified: Secondary | ICD-10-CM | POA: Diagnosis not present

## 2021-04-08 DIAGNOSIS — I2510 Atherosclerotic heart disease of native coronary artery without angina pectoris: Secondary | ICD-10-CM | POA: Diagnosis not present

## 2021-04-08 DIAGNOSIS — I10 Essential (primary) hypertension: Secondary | ICD-10-CM | POA: Diagnosis not present

## 2021-04-08 DIAGNOSIS — E871 Hypo-osmolality and hyponatremia: Secondary | ICD-10-CM | POA: Diagnosis not present

## 2021-04-08 DIAGNOSIS — F419 Anxiety disorder, unspecified: Secondary | ICD-10-CM | POA: Diagnosis not present

## 2021-04-08 LAB — BASIC METABOLIC PANEL
BUN: 12 mg/dL (ref 6–23)
CO2: 28 mEq/L (ref 19–32)
Calcium: 9.7 mg/dL (ref 8.4–10.5)
Chloride: 96 mEq/L (ref 96–112)
Creatinine, Ser: 0.62 mg/dL (ref 0.40–1.20)
GFR: 82.03 mL/min (ref >60.00)
Glucose, Bld: 99 mg/dL (ref 70–99)
Potassium: 4.2 mEq/L (ref 3.5–5.1)
Sodium: 133 mEq/L — ABNORMAL LOW (ref 135–145)

## 2021-04-08 NOTE — Patient Instructions (Signed)
Nice to see you. I am going to confer with your cardiology team and psychiatry team to get their input on medications for you.  Once I hear back from them I will let you know. Please stop your trazodone. We are going to check a urine test and the blood test today.

## 2021-04-08 NOTE — Assessment & Plan Note (Signed)
Chronic intermittent issue.  In the past medication was suspected as a cause though she has remained mildly hyponatremic since coming off of Celexa.  We will recheck her values today.  I am going to have her stop the trazodone to see if that will make a difference.  We will also check a urine sodium and osmolality.

## 2021-04-08 NOTE — Assessment & Plan Note (Signed)
Generally above goal.  Discussed that there is a more relaxed goal for her given her age and her aortic valve stenosis issues.  She will continue Imdur 60 mg once daily and metoprolol 50 mg twice daily.  I will communicate with her cardiology team to get their input on the neck step in management of her blood pressure.

## 2021-04-08 NOTE — Assessment & Plan Note (Signed)
This continues to be an issue.  Her sister-in-law asked about lorazepam today.  We did previously try the patient on clonazepam though she had sleepiness and dizziness with the clonazepam previously.  She has been tried on multiple antidepressants with either side effects or ineffectiveness including Cymbalta, mirtazapine, and Celexa.  I agree with the need to try Wellbutrin though we do need to ensure that her sodium levels normalize.  I will communicate with her psychiatrist to get her input on the lorazepam though given her prior issues with clonazepam this may not be the best option.

## 2021-04-08 NOTE — Progress Notes (Signed)
[name], MD Phone: [phone]  Amy Morton is a 86 y.o. female who presents today for follow-up.  Hypertension: The patient had an episode of not feeling quite right.  This occurred on 04/02/2021.  They checked her blood pressure at home and it was elevated.  Her sister notes it went up into the [id number] systolically.  She was evaluated in the emergency department.  Her blood pressure there was 192/99.  Her sodium was found to be 128.  Her high-sensitivity troponin was mildly elevated though relatively stable.  Her EKG was sinus tachycardia with PACs.  There were no acute ST elevations or depressions.  CT head had no acute changes.  The ED physician increased her Imdur.  Her blood pressure sounds leaving the emergency department has ranged from 121-198/60-98 with her blood pressures in the morning being more reasonable in the 140s over 60s and her blood pressures later in the day being into the 170s and 180s.  Based on review of her most recent cardiology note it appears that the goal systolic blood pressure is 150 for this patient.  She is currently taking Imdur 60 mg once daily and metoprolol 50 mg twice daily.  She notes no chest pain, shortness of breath, or edema.  Hyponatremia: This has been an ongoing issue.  Initially this was felt to be related to an SSRI.  Her sodium has remained slightly low despite being off of the SSRI.  She recently saw psychiatry given her significant anxiety issues and memory difficulties.  They talked about starting her on Wellbutrin though wanted to hold off on this until her sodium was evaluated.  She is currently taking trazodone which does have some potential for hyponatremia.  Anxiety/depression/memory difficulty: The patient saw psychiatry recently.  They discussed starting on Wellbutrin once her sodium level returned if it was normal.  Based on review of the note they deferred cholinesterase inhibitor for her memory given her cardiac issues.  Social  History   Tobacco Use  Smoking Status Never  Smokeless Tobacco Never    Current Outpatient Medications on File Prior to Visit  Medication Sig Dispense Refill   acetaminophen (TYLENOL) 500 MG tablet Take 1,000 mg by mouth in the morning and at bedtime.     Ascorbic Acid (VITAMIN C PO) Take by mouth.     aspirin 81 MG tablet Take 81 mg by mouth daily.     atorvastatin (LIPITOR) 40 MG tablet Take 1 tablet (40 mg total) by mouth daily. 90 tablet 3   Calcium Carbonate-Vit D-Min (CALCIUM 1200 PO) Take 1,200 mg by mouth daily.     Cholecalciferol 5000 units TABS Take 5,000 Units by mouth daily.     colestipol (COLESTID) 1 g tablet TAKE 1 TABLET(1 GRAM) BY MOUTH DAILY 90 tablet 1   isosorbide mononitrate (IMDUR) 30 MG 24 hr tablet Take 2 tablets (60 mg total) by mouth daily. 60 tablet 0   metoprolol tartrate (LOPRESSOR) 50 MG tablet TAKE 1 TABLET(50 MG) BY MOUTH TWICE DAILY 180 tablet 1   Multiple Vitamin (MULTIVITAMIN) capsule Take 1 capsule by mouth daily.     nitroGLYCERIN (NITROSTAT) 0.4 MG SL tablet PLACE 1 TABLET UNDER THE TONGUE EVERY 5 MINUTES AS NEEDED FOR CHEST PAIN, 3 DOSES MAX 25 tablet 1   Nutritional Supplements (OSTEO ADVANCE) TABS Take 1 tablet by mouth daily.      omega-3 acid ethyl esters (LOVAZA) 1 g capsule TAKE 2 CAPSULES BY MOUTH TWICE DAILY 360 capsule 1   pantoprazole (  PROTONIX) 40 MG tablet TAKE 1 TABLET(40 MG) BY MOUTH DAILY 90 tablet 1   potassium chloride SA (KLOR-CON) 20 MEQ tablet Take 1 tablet (20 mEq total) by mouth daily. 90 tablet 3   Ubiquinol 100 MG CAPS Take 100 mg by mouth every morning.     vitamin B-12 (CYANOCOBALAMIN) 250 MCG tablet Take 250 mcg by mouth daily.     alendronate (FOSAMAX) 70 MG tablet Take 1 tablet (70 mg total) by mouth every 7 (seven) days. Take with a full glass of water on an empty stomach. (Patient not taking: No sig reported) 4 tablet 11   clopidogrel (PLAVIX) 75 MG tablet Take 75 mg by mouth daily. (Patient not taking: No sig reported)      Current Facility-Administered Medications on File Prior to Visit  Medication Dose Route Frequency Provider Last Rate Last Admin   sodium chloride 0.9 % nebulizer solution 3 mL  3 mL Nebulization Once [name], MD       Followed by   methacholine (PROVOCHOLINE) inhaler solution 0.125 mg  2 mL Inhalation Once [name], MD       Followed by   methacholine (PROVOCHOLINE) inhaler solution 0.5 mg  2 mL Inhalation Once [name], MD       Followed by   methacholine (PROVOCHOLINE) inhaler solution 2 mg  2 mL Inhalation Once [name], MD       Followed by   methacholine (PROVOCHOLINE) inhaler solution 8 mg  2 mL Inhalation Once [name], MD       Followed by   methacholine (PROVOCHOLINE) inhaler solution 32 mg  2 mL Inhalation Once [name], MD         ROS see history of present illness  Objective  Physical Exam Vitals:   04/08/21 1312  BP: (!) 160/82  Pulse: 68  SpO2: 97%    BP Readings from Last 3 Encounters:  04/08/21 (!) 160/82  04/02/21 (!) 144/79  03/25/21 (!) 164/81   Wt Readings from Last 3 Encounters:  04/08/21 114 lb 12.8 oz (52.1 kg)  03/25/21 116 lb 6.4 oz (52.8 kg)  03/04/21 116 lb 12.8 oz (53 kg)    Physical Exam Constitutional:      General: She is not in acute distress.    Appearance: She is not diaphoretic.  Cardiovascular:     Rate and Rhythm: Normal rate and regular rhythm.     Heart sounds: Normal heart sounds.  Pulmonary:     Effort: Pulmonary effort is normal.     Breath sounds: Normal breath sounds.  Musculoskeletal:     Right lower leg: No edema.     Left lower leg: No edema.  Skin:    General: Skin is warm and dry.  Neurological:     Mental Status: She is alert.  Psychiatric:     Comments: Affect flat     Assessment/Plan: Please see individual problem list.  Problem List Items Addressed This Visit     Anxiety and depression (Chronic)    This continues to be an issue.  Her  sister-in-law asked about lorazepam today.  We did previously try the patient on clonazepam though she had sleepiness and dizziness with the clonazepam previously.  She has been tried on multiple antidepressants with either side effects or ineffectiveness including Cymbalta, mirtazapine, and Celexa.  I agree with the need to try Wellbutrin though we do need to ensure that her sodium levels normalize.  I  will communicate with her psychiatrist to get her input on the lorazepam though given her prior issues with clonazepam this may not be the best option.      Essential hypertension (Chronic)    Generally above goal.  Discussed that there is a more relaxed goal for her given her age and her aortic valve stenosis issues.  She will continue Imdur 60 mg once daily and metoprolol 50 mg twice daily.  I will communicate with her cardiology team to get their input on the neck step in management of her blood pressure.      Hyponatremia - Primary (Chronic)    Chronic intermittent issue.  In the past medication was suspected as a cause though she has remained mildly hyponatremic since coming off of Celexa.  We will recheck her values today.  I am going to have her stop the trazodone to see if that will make a difference.  We will also check a urine sodium and osmolality.      Relevant Orders   Sodium, urine, random   Osmolality, urine   Basic Metabolic Panel (BMET)     Return in about 6 weeks (around 05/20/2021).  This visit occurred during the SARS-CoV-2 public health emergency.  Safety protocols were in place, including screening questions prior to the visit, additional usage of staff PPE, and extensive cleaning of exam room while observing appropriate contact time as indicated for disinfecting solutions.    [name], MD [place]

## 2021-04-09 ENCOUNTER — Telehealth: Payer: Self-pay | Admitting: Family Medicine

## 2021-04-09 ENCOUNTER — Telehealth: Payer: Self-pay

## 2021-04-09 MED ORDER — CARVEDILOL 6.25 MG PO TABS: 6.2500 mg | ORAL_TABLET | Freq: Two times a day (BID) | ORAL | 3 refills | Status: DC

## 2021-04-09 MED ORDER — LORAZEPAM 0.5 MG PO TABS: 0.5000 mg | ORAL_TABLET | Freq: Two times a day (BID) | ORAL | 0 refills | Status: DC | PRN

## 2021-04-09 NOTE — Telephone Encounter (Signed)
Carvedilol 6.25 mg sent into pt pharmacy

## 2021-04-09 NOTE — Telephone Encounter (Signed)
-----   Message from Norman Clay, MD sent at 04/08/2021  3:31 PM EDT ----- Regarding: RE: follow-up Hi Dr. Caryl Bis,   Thanks for your update. I may consider buspirone for anxiety if that has not been tried yet. Although I am not sure if it is just not well studied compared to other antidepressants, it does not list hyponatremia as a potential adverse reaction. Minimal dose of lorazepam could still be an option if used judiciously with the hope that shorter half life makes any difference, although I agree with your concern given her previous reaction to clonazepam. Having said that, I am hoping that we can try antidepressant in the future if her sodium level raises to acceptable range after having evaluation/treatment if possible. Although I recommended [facility], I am not sure if they would like to proceed with this. Please let me know if any more questions.   Thank you so much again! Amy Morton   ----- Message ----- From: [name], MD Sent: 04/08/2021   3:11 PM EDT To: Norman Clay, MD Subject: follow-up                                      York Grice,   I saw [name] for follow-up today. I am obtaining urine studies to evaluate her sodium levels and am having her stop the trazodone to see if that will make a difference. Her sister in law wondered about trying lorazepam for a short time to help with her acute anxiety issues. I reminded her that we tried klonopin previously and the patient had drowsiness and dizziness with that, though she still wondered if it would be an option for the patient. I advised I would check with you so that we are on the same page. It would not be my favorite option for her particularly given her prior response to the klonopin. What are your thoughts? Is there anything that could be used for her acute anxiety while we try to get her on something more useful long term? I have used hydroxyzine for acute anxiety in younger patients though I would avoid that with Mrs  Farquhar given side effect risks. Thanks for your help.  Randall Hiss

## 2021-04-09 NOTE — Telephone Encounter (Signed)
Spoke with Amy Morton. Amy Morton verbalized understanding of medication changes. Carvedilol 6.25 mg has been sent into pharmacy

## 2021-04-09 NOTE — Telephone Encounter (Signed)
-----   Message from Theora Gianotti, NP sent at 04/08/2021  7:13 PM EDT ----- Regarding: RE: question about BP Thanks for reaching out.  I'm not a fan of pushing nitrates in pts w/ severe AS, as the vasodilation and associated drops in preload may worsen effective gradient and make syncope and angina more likely.  In that setting, I'd favor dropping her imdur back to '30mg'$  daily and think we might get more bang for our buck by switching her metoprolol 50 bid to carvedilol 6.25 bid w/ likely plan/need to titrate to 12.5 bid.  It looks like she sees you again 9/28 and Dr. Fletcher Anon in mid-October, which should be good timing to measure effectiveness of change and potential need for titration.  Take care,  Gerald Stabs ----- Message ----- From: [name], MD Sent: 04/08/2021   3:06 PM EDT To: Theora Gianotti, NP Subject: question about BP                              Maricela Bo,  I saw Mrs Holick for follow-up today. It looks like you saw he back in July. She was recently in the ED (9/6) for elevated BP with systolic BPs at home in to the 220s. The Ed increased her imdur to 60 mg daily and had her continue on her metoprolol. Most of her morning BPs since getting out of the ED have been in the 140s/70s with a single day of 120/60. Her afternoon BPs have been in the 170s-180s/70s-90s. I know her goal systolic BP is [id number] based on your last note. I wanted to get input from you all on what the next step might be for her BP meds given her aortic stenosis. They just increased the imdur, so it might be prudent to monitor a little longer though would you want to add anything additional to her regimen at this time? Thanks for your help.   [name]

## 2021-04-09 NOTE — Telephone Encounter (Signed)
Spoke with [name], she states that buspirone didn't seem to work for [employer] but isn't against using it again. She says she will see how the lorazepam works first.

## 2021-04-09 NOTE — Telephone Encounter (Signed)
Please let the patient and her sister-in-law know that I heard back from psychiatry.  She noted that a small dose of lorazepam could be an option.  Patient would only need to take this if really needed.  If she has any drowsiness with this they would need to discontinue it and let us know.  Please also let them know that we could try buspirone again.  It looks like she is been on this in the past so I cannot exactly tell why it was discontinued.  If they are willing to try that again for her anxiety I could send it in.  I will go ahead and send in the lorazepam at a small dose.

## 2021-04-09 NOTE — Telephone Encounter (Signed)
Please let the patient know that I heard back from cardiology.  They recommended decreasing her Imdur back to 30 mg once daily and switching her from metoprolol to carvedilol.  The reasoning behind decreasing the dose of the Imdur is related to her aortic stenosis.  You can send in the carvedilol 6.25 mg by mouth twice daily with 60 tablets and 3 refills once you speak with her.  If she notices any fatigue or rise in her blood pressure with this they will need to let us know.

## 2021-04-10 LAB — SODIUM, URINE, RANDOM: Sodium, Ur: 56 mmol/L (ref 28–272)

## 2021-04-10 LAB — OSMOLALITY, URINE: Osmolality, Ur: 446 mOsm/kg (ref 50–1200)

## 2021-04-10 NOTE — Telephone Encounter (Signed)
Noted  

## 2021-04-12 ENCOUNTER — Ambulatory Visit: Payer: Medicare Other | Admitting: Family Medicine

## 2021-04-15 ENCOUNTER — Other Ambulatory Visit: Payer: Self-pay | Admitting: Family Medicine

## 2021-04-15 ENCOUNTER — Telehealth: Payer: Self-pay

## 2021-04-15 NOTE — Telephone Encounter (Signed)
Patient was approved for Lorazapam  from 04/12/2021-04/12/2022.  Pharmacy was notified.  Amy Morton,cma

## 2021-04-24 ENCOUNTER — Ambulatory Visit (INDEPENDENT_AMBULATORY_CARE_PROVIDER_SITE_OTHER): Payer: Medicare Other | Admitting: Family Medicine

## 2021-04-24 ENCOUNTER — Encounter: Payer: Self-pay | Admitting: Family Medicine

## 2021-04-24 ENCOUNTER — Other Ambulatory Visit: Payer: Self-pay

## 2021-04-24 VITALS — BP 138/80 | HR 83 | Temp 97.7°F | Ht <= 58 in | Wt 117.0 lb

## 2021-04-24 DIAGNOSIS — Z9861 Coronary angioplasty status: Secondary | ICD-10-CM | POA: Diagnosis not present

## 2021-04-24 DIAGNOSIS — I2510 Atherosclerotic heart disease of native coronary artery without angina pectoris: Secondary | ICD-10-CM

## 2021-04-24 DIAGNOSIS — E871 Hypo-osmolality and hyponatremia: Secondary | ICD-10-CM | POA: Diagnosis not present

## 2021-04-24 DIAGNOSIS — Z23 Encounter for immunization: Secondary | ICD-10-CM

## 2021-04-24 DIAGNOSIS — F32A Depression, unspecified: Secondary | ICD-10-CM

## 2021-04-24 DIAGNOSIS — I10 Essential (primary) hypertension: Secondary | ICD-10-CM

## 2021-04-24 DIAGNOSIS — F419 Anxiety disorder, unspecified: Secondary | ICD-10-CM

## 2021-04-24 LAB — BASIC METABOLIC PANEL
BUN: 12 mg/dL (ref 6–23)
CO2: 26 mEq/L (ref 19–32)
Calcium: 9.3 mg/dL (ref 8.4–10.5)
Chloride: 97 mEq/L (ref 96–112)
Creatinine, Ser: 0.63 mg/dL (ref 0.40–1.20)
GFR: 81.69 mL/min (ref >60.00)
Glucose, Bld: 102 mg/dL — ABNORMAL HIGH (ref 70–99)
Potassium: 4.5 mEq/L (ref 3.5–5.1)
Sodium: 131 mEq/L — ABNORMAL LOW (ref 135–145)

## 2021-04-24 MED ORDER — LORAZEPAM 0.5 MG PO TABS: 0.5000 mg | ORAL_TABLET | Freq: Two times a day (BID) | ORAL | 0 refills | Status: DC | PRN

## 2021-04-24 NOTE — Patient Instructions (Signed)
Nice to see you. We will get lab work today and contact you with the results.  We will choose the next step in management based on her lab work.

## 2021-04-24 NOTE — Assessment & Plan Note (Signed)
Much improved.  She will continue Imdur 30 mg once daily and carvedilol 6.25 mg twice daily.

## 2021-04-24 NOTE — Assessment & Plan Note (Signed)
Stable.  Continues to have issues with this.  She can continue the lorazepam 1 tablet by mouth 2 times daily as needed as she has gotten benefit from it.  Discussed she could try taking this right before bedtime to see if that would help her sleep better.  Advised to monitor for any drowsiness or dizziness with taking this medication and if those things occurred she would need to let us know.  Discussed there is a risk of falls with this kind of medication.

## 2021-04-24 NOTE — Assessment & Plan Note (Signed)
Chronic issue.  We will plan on rechecking today now that she is off of the trazodone.  If her sodium is still low we will need to have a discussion on doing testing at the infusion center for cortisol and ACTH issues.  The patient does have a difficult time traveling and thus we will hold off on that work-up until we know what her sodium level is.

## 2021-04-24 NOTE — Progress Notes (Signed)
t [name], MD Phone: [phone]  Amy Morton is a 86 y.o. female who presents today for f/u.  HYPERTENSION Disease Monitoring Home BP Monitoring not checking consistently at home Chest pain- no    Dyspnea- no Medications Compliance-  taking imdur 30 mg daily and coreg 6.25 mg BID.  BMET    Component Value Date/Time   NA 133 (L) 04/08/2021 1342   NA 128 (L) 12/12/2020 1518   NA 135 11/17/2014 2031   K 4.2 04/08/2021 1342   K 3.5 11/17/2014 2031   CL 96 04/08/2021 1342   CL 101 11/17/2014 2031   CO2 28 04/08/2021 1342   CO2 23 11/17/2014 2031   GLUCOSE 99 04/08/2021 1342   GLUCOSE 119 (H) 11/17/2014 2031   BUN 12 04/08/2021 1342   BUN 12 12/12/2020 1518   BUN 15 11/17/2014 2031   CREATININE 0.62 04/08/2021 1342   CREATININE 0.73 04/28/2016 1003   CALCIUM 9.7 04/08/2021 1342   CALCIUM 9.2 11/17/2014 2031   GFRNONAA >60 04/02/2021 1845   GFRNONAA >60 11/17/2014 2031   GFRAA 84 07/18/2020 1205   GFRAA >60 11/17/2014 2031   Anxiety/depression: This continues to be an issue.  Amy Morton is not sleeping well.  Amy Morton has trouble falling asleep.  Amy Morton is not sure how long Amy Morton sleeps for Amy Morton.  Amy Morton has been unable to start on any additional SSRIs or SNRIs or Wellbutrin related to Amy Morton hyponatremia.  The lorazepam does help calm Amy Morton down.  They wonder if Amy Morton can take this twice daily as needed.  They did stop the trazodone.  Hyponatremia: Last sodium was 133.  Amy Morton came off of the trazodone to see if that would be beneficial.  Amy Morton urine osmolality and urine sodium levels pointed towards testing for cortisol issues with Amy Morton low sodium.   Social History   Tobacco Use  Smoking Status Never  Smokeless Tobacco Never    Current Outpatient Medications on File Prior to Visit  Medication Sig Dispense Refill   acetaminophen (TYLENOL) 500 MG tablet Take 1,000 mg by mouth in the morning and at bedtime.     alendronate (FOSAMAX) 70 MG tablet Take 1 tablet (70 mg total) by mouth every 7  (seven) days. Take with a full glass of water on an empty stomach. 4 tablet 11   Ascorbic Acid (VITAMIN C PO) Take by mouth.     aspirin 81 MG tablet Take 81 mg by mouth daily.     atorvastatin (LIPITOR) 40 MG tablet Take 1 tablet (40 mg total) by mouth daily. 90 tablet 3   Calcium Carbonate-Vit D-Min (CALCIUM 1200 PO) Take 1,200 mg by mouth daily.     carvedilol (COREG) 6.25 MG tablet Take 1 tablet (6.25 mg total) by mouth 2 (two) times daily with a meal. 60 tablet 3   Cholecalciferol 5000 units TABS Take 5,000 Units by mouth daily.     colestipol (COLESTID) 1 g tablet TAKE 1 TABLET(1 GRAM) BY MOUTH DAILY 90 tablet 1   isosorbide mononitrate (IMDUR) 30 MG 24 hr tablet Take 2 tablets (60 mg total) by mouth daily. 60 tablet 0   Multiple Vitamin (MULTIVITAMIN) capsule Take 1 capsule by mouth daily.     nitroGLYCERIN (NITROSTAT) 0.4 MG SL tablet PLACE 1 TABLET UNDER THE TONGUE EVERY 5 MINUTES AS NEEDED FOR CHEST PAIN, 3 DOSES MAX 25 tablet 1   Nutritional Supplements (OSTEO ADVANCE) TABS Take 1 tablet by mouth daily.      omega-3 acid ethyl esters (  LOVAZA) 1 g capsule TAKE 2 CAPSULES BY MOUTH TWICE DAILY 360 capsule 1   pantoprazole (PROTONIX) 40 MG tablet TAKE 1 TABLET(40 MG) BY MOUTH DAILY 90 tablet 1   potassium chloride SA (KLOR-CON) 20 MEQ tablet Take 1 tablet (20 mEq total) by mouth daily. 90 tablet 3   Ubiquinol 100 MG CAPS Take 100 mg by mouth every morning.     vitamin B-12 (CYANOCOBALAMIN) 250 MCG tablet Take 250 mcg by mouth daily.     clopidogrel (PLAVIX) 75 MG tablet Take 75 mg by mouth daily. (Patient not taking: No sig reported)     Current Facility-Administered Medications on File Prior to Visit  Medication Dose Route Frequency Provider Last Rate Last Admin   sodium chloride 0.9 % nebulizer solution 3 mL  3 mL Nebulization Once [name], MD       Followed by   methacholine (PROVOCHOLINE) inhaler solution 0.125 mg  2 mL Inhalation Once [name], MD        Followed by   methacholine (PROVOCHOLINE) inhaler solution 0.5 mg  2 mL Inhalation Once [name], MD       Followed by   methacholine (PROVOCHOLINE) inhaler solution 2 mg  2 mL Inhalation Once [name], MD       Followed by   methacholine (PROVOCHOLINE) inhaler solution 8 mg  2 mL Inhalation Once [name], MD       Followed by   methacholine (PROVOCHOLINE) inhaler solution 32 mg  2 mL Inhalation Once [name], MD         ROS see history of present illness  Objective  Physical Exam Vitals:   04/24/21 1159  BP: 138/80  Pulse: 83  Temp: 97.7 F (36.5 C)  SpO2: 97%    BP Readings from Last 3 Encounters:  04/24/21 138/80  04/08/21 (!) 160/82  04/02/21 (!) 144/79   Wt Readings from Last 3 Encounters:  04/24/21 117 lb (53.1 kg)  04/08/21 114 lb 12.8 oz (52.1 kg)  03/04/21 116 lb 12.8 oz (53 kg)    Physical Exam Constitutional:      General: Amy Morton is not in acute distress.    Appearance: Amy Morton is not diaphoretic.  Cardiovascular:     Rate and Rhythm: Normal rate and regular rhythm.     Heart sounds: Normal heart sounds.  Pulmonary:     Effort: Pulmonary effort is normal.     Breath sounds: Normal breath sounds.  Skin:    General: Skin is warm and dry.  Neurological:     Mental Status: Amy Morton is alert.     Assessment/Plan: Please see individual problem list.  Problem List Items Addressed This Visit     Anxiety and depression (Chronic)    Stable.  Continues to have issues with this.  Amy Morton can continue the lorazepam 1 tablet by mouth 2 times daily as needed as Amy Morton has gotten benefit from it.  Discussed Amy Morton could try taking this right before bedtime to see if that would help Amy Morton sleep better.  Advised to monitor for any drowsiness or dizziness with taking this medication and if those things occurred Amy Morton would need to let us know.  Discussed there is a risk of falls with this kind of medication.      Relevant Medications   LORazepam  (ATIVAN) 0.5 MG tablet   Essential hypertension (Chronic)    Much improved.  Amy Morton will continue Imdur 30 mg once daily and carvedilol  6.25 mg twice daily.      Hyponatremia - Primary (Chronic)    Chronic issue.  We will plan on rechecking today now that Amy Morton is off of the trazodone.  If Amy Morton sodium is still low we will need to have a discussion on doing testing at the infusion center for cortisol and ACTH issues.  The patient does have a difficult time traveling and thus we will hold off on that work-up until we know what Amy Morton sodium level is.      Relevant Orders   Basic Metabolic Panel (BMET)   Other Visit Diagnoses     Need for immunization against influenza       Relevant Orders   Flu Vaccine QUAD High Dose(Fluad) (Completed)       Return in about 6 weeks (around 06/05/2021) for Follow-up anxiety/hyponatremia.  This visit occurred during the SARS-CoV-2 public health emergency.  Safety protocols were in place, including screening questions prior to the visit, additional usage of staff PPE, and extensive cleaning of exam room while observing appropriate contact time as indicated for disinfecting solutions.    [name], MD [place]

## 2021-04-25 DIAGNOSIS — H43813 Vitreous degeneration, bilateral: Secondary | ICD-10-CM | POA: Diagnosis not present

## 2021-04-29 ENCOUNTER — Encounter: Payer: Self-pay | Admitting: Family Medicine

## 2021-05-03 ENCOUNTER — Ambulatory Visit: Payer: Medicare Other

## 2021-05-04 IMAGING — MR MR MRA HEAD W/O CM
2 series · 18 of 48 positions shown · non-contrast
Comparison: 06/05/2020 head CT and prior.

CLINICAL DATA: Transient ischemic attack (TIA)

EXAM:
MRI HEAD WITHOUT CONTRAST
MRA HEAD WITHOUT CONTRAST
TECHNIQUE: Multiplanar, multiecho pulse sequences of the brain and surrounding
structures were obtained without intravenous contrast. Angiographic
images of the head were obtained using MRA technique without
contrast.

[Series 1: TOF · axial · 0.5mm · 0.41mm/px · z∈[-117,-15]mm · 17 of 224 slices shown]
[im 1/224]
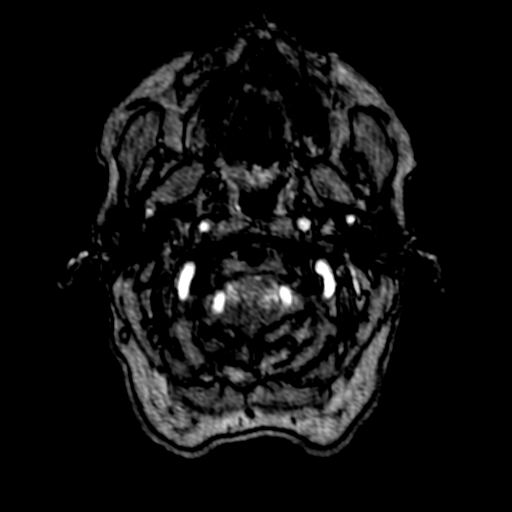
[im 5/224]
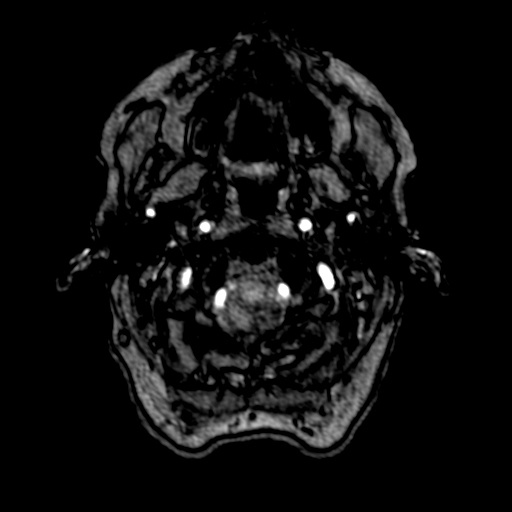
[im 10/224]
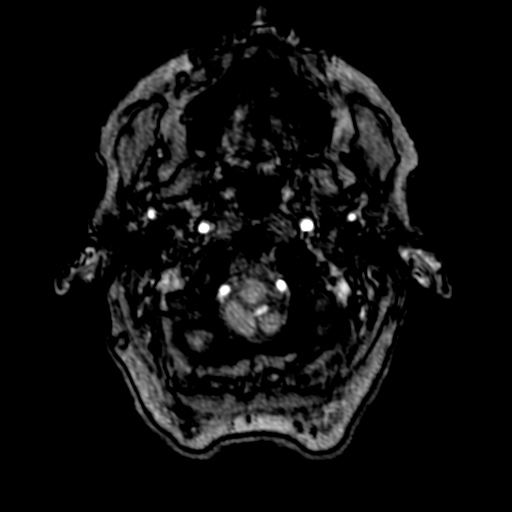
[im 15/224]
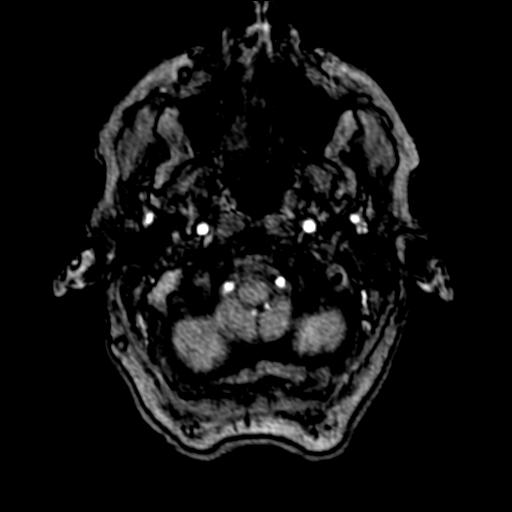
[im 20/224]
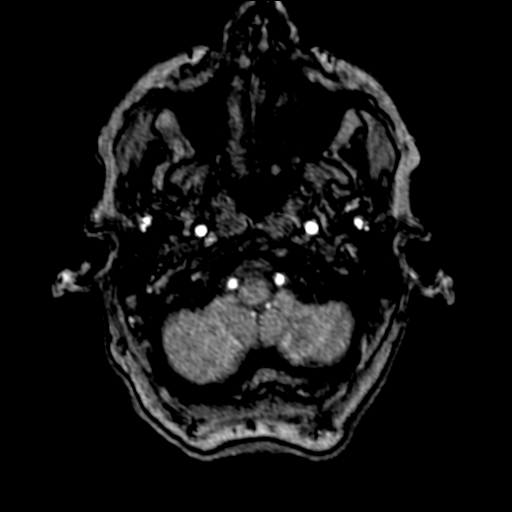
[im 25/224]
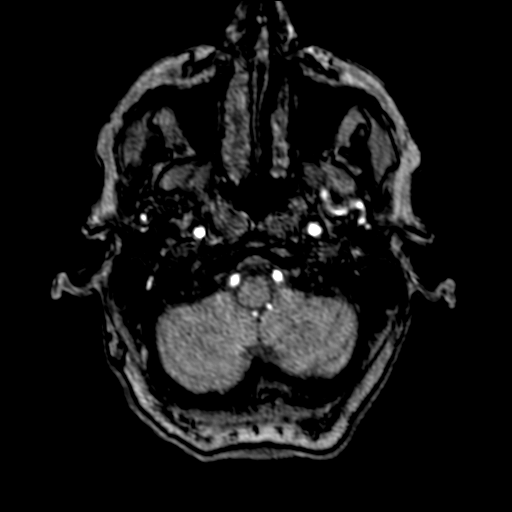
[im 30/224]
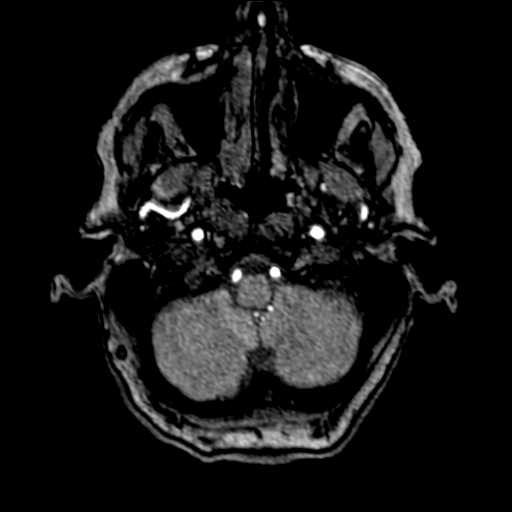
[im 34/224]
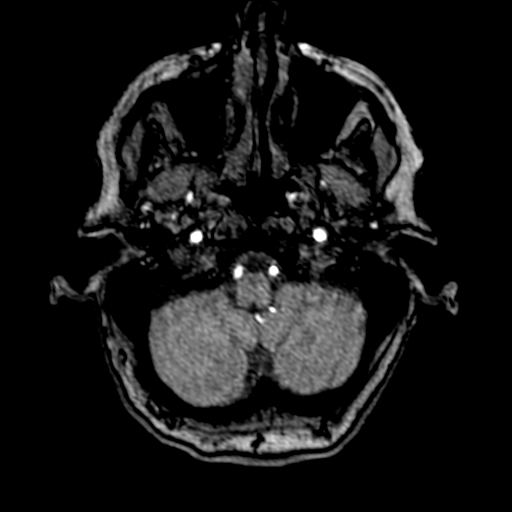
[im 39/224]
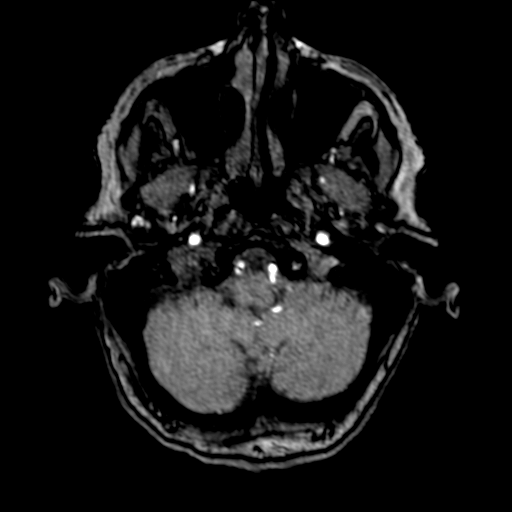
[im 68/224]
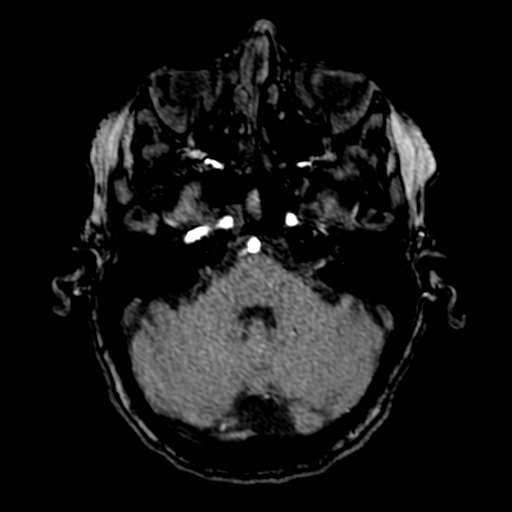
[im 97/224]
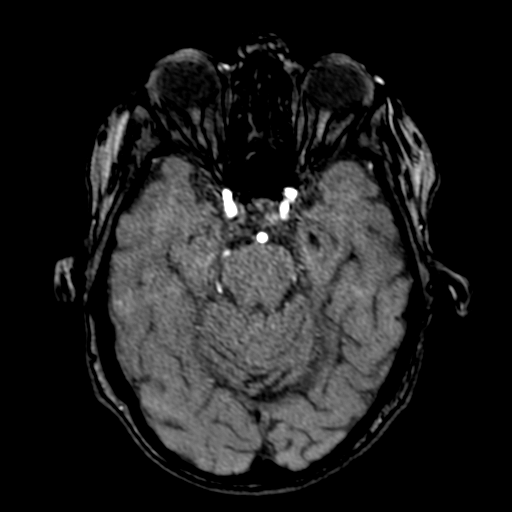
[im 112/224]
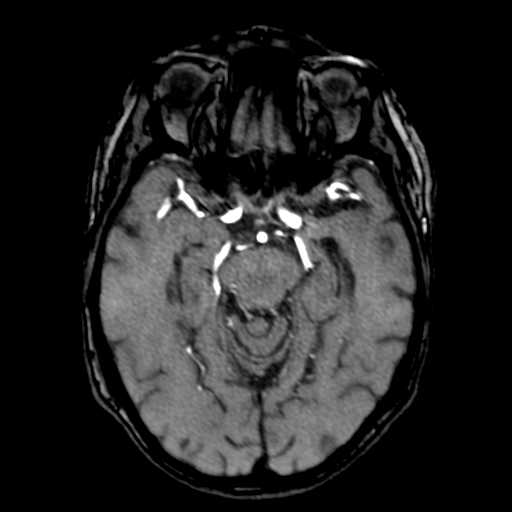
[im 127/224]
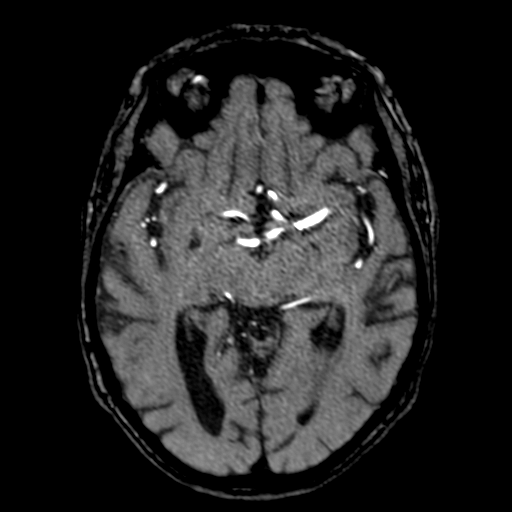
[im 156/224]
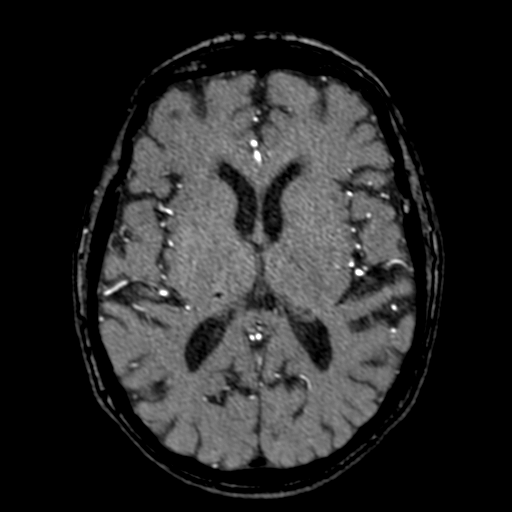
[im 185/224]
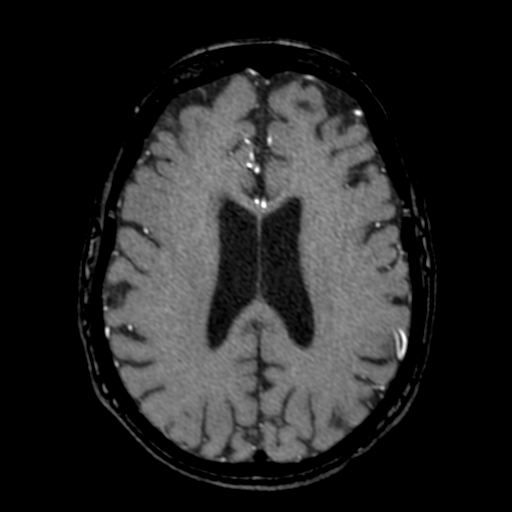
[im 190/224]
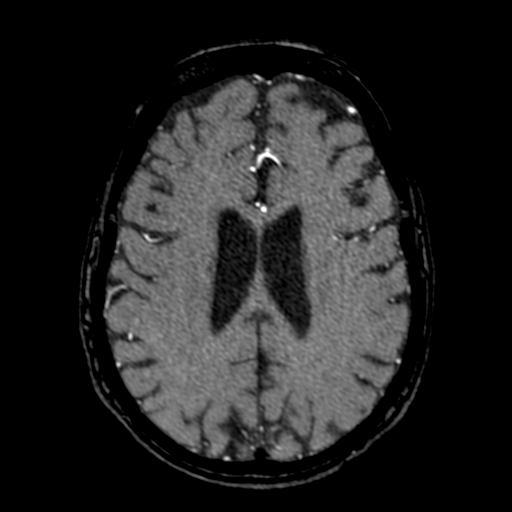
[im 214/224]
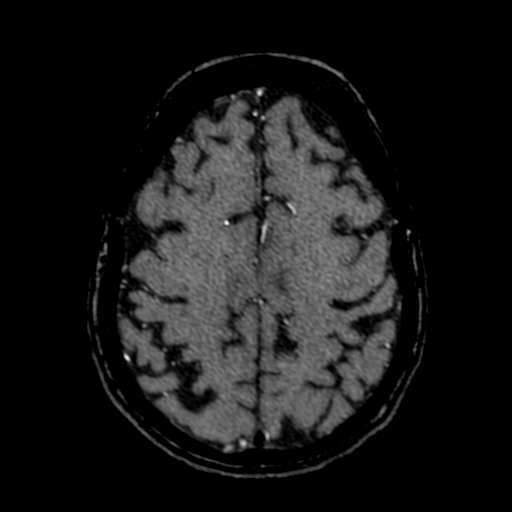

[Series 1044: h-f ant · axial · 0.4mm · 0.41mm/px · 1 of 2 slices shown]
[im 1/2]
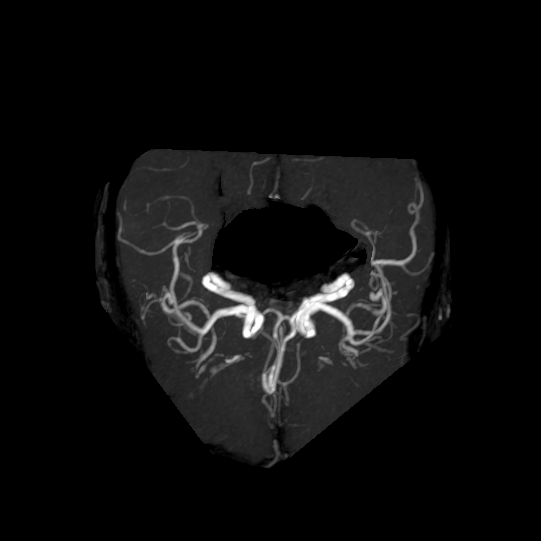

[18 of 48 positions shown; findings below may reference images not displayed]

FINDINGS: MRI HEAD FINDINGS

Brain: Tiny right parietal foci of DWI hyperintensity ([DATE]). Remote
left cerebellar and right thalamic microhemorrhages. No midline
shift, ventriculomegaly or extra-axial fluid collection. No mass
lesion. Mild cerebral atrophy with ex vacuo dilatation. Chronic left
thalamic lacunar insult. Mild chronic microvascular ischemic
changes. Sequela of bilateral anterior temporal insults.

Vascular: Please see MRA.

Skull and upper cervical spine: Normal marrow signal.

Sinuses/Orbits: Sequela of bilateral lens replacement. Mild ethmoid,
maxillary and sphenoid sinus mucosal thickening. Clear mastoid air
cells.

Other: None.

MRA HEAD FINDINGS

Anterior circulation: Patent ICAs. Moderate to severe narrowing of
the distal left ICA cervical segment. Mild right supraclinoid ICA
narrowing. Right A1 segment hypoplasia. Patent ACAs and MCAs. No
aneurysm, or vascular malformation.

Posterior circulation: Patent V4 segments. Mild narrowing of the mid
right V4 segment and linear filling defect may reflect a web versus
atheromatous plaque. Patent left PICA. Patent basilar and superior
cerebellar arteries. Dominant right AICA. Patent posterior cerebral
arteries. No significant stenosis, proximal occlusion, aneurysm, or
vascular malformation.

Venous sinuses: No evidence of thrombosis.

Anatomic variants: None of significance.
IMPRESSION: MRI head:

Tiny right parietal acute/subacute infarcts. Chronic left thalamic
lacunar insult.

Mild cerebral atrophy and chronic microvascular ischemic changes.

Sequela of remote anterior temporal insults.

MRA head:

Moderate to severe distal left ICA cervical segment narrowing.

Mild right supraclinoid ICA and mid right V4 segment narrowing.

These results will be called to the ordering clinician or
representative by the Radiologist Assistant, and communication
documented in the PACS or [REDACTED].

## 2021-05-08 NOTE — Progress Notes (Signed)
[facility] MD/PA/NP OP Progress Note  05/13/2021 4:08 PM Amy Morton  MRN:  [id number]  Chief Complaint:  Chief Complaint   Follow-up    HPI:  This is a follow-up appointment for depression, anxiety and neurocognitive deficits.  The majority of the history is obtained from her sister, Amy Morton, who presents to the interview, partly due to difficulty in communication due to hearing loss.  Amy Morton has been doing better since being started on Buspar.  She has been sleeping better.  However, she continues to have crying spells and an anxiety.  She tends to feel down around her memory issues. Amy Morton talks about examples of the patient calling her the next day to ensure to bring her to the appointment, although it was discussed the previous day.  She is also frustrated that she is not having strength compared to before.  She may take a walk with her sister, although she stays in the house most of the time if she is by herself.  She does not cook as much, and goes to the patio at times.  She has depressive symptoms as in PH-9.  She denies SI.  She eats well. Amy Morton started to give her Gatorade to see if it helps her hyponatremia.  Noted that she has chronic diarrhea.  She reportedly had surgery on her intestine.  Although they are advised to discuss this with her PCP given it may be related to hyponatremia.  She denies panic attacks or irritability.  She denies paranoia.  She denies hallucinations.  She is not interested in [place], as she is tired from test she had over the past several months Amy Morton asks more information about Amy Morton to review on themselves).  They check home BP150-160/70-80.  They do not recall the reason BuSpar was discontinued.  They are willing to try this medication at this time.    Functional Status Instrumental Activities of Daily Living (IADLs):  Amy Morton is independent in the following: managing finances  Requires assistance with the following: medications, driving, cooking    Activities of Daily Living (ADLs):  Amy Morton is independent in the following: bathing and hygiene, feeding, continence, grooming and toileting, walking      Daily routine: watches TV, shopping at times Exercise: Employment: used to be self employed Support: sister, son Household: son stays overnight Marital status: widowed, her husband deceased in 11-04-2008 from cancer.  Number of children: Total of 3. One living son. One son and her daughter deceased. She has 3 great grandchildren     Visit Diagnosis:    ICD-10-CM   1. Moderate episode of recurrent major depressive disorder (HCC)  F33.1     2. Cognitive decline  R41.89     3. Anxiety state  F41.1       Past Psychiatric History: Please see initial evaluation for full details. I have reviewed the history. No updates at this time.     Past Medical History:  Past Medical History:  Diagnosis Date   (HFpEF) heart failure with preserved ejection fraction ([place])    a. 08/2020 PET: Not suggestive of transthyretin amyloidosis; b. 11/2020 Echo: 60-65%, gr1 DD.   Abnormal mammogram 04-Nov-2005   Recommend close follow up   Anemia    Iron, b12, SPEP, UPEP normal, 12/2012   Anxiety    Aortic insufficiency    a. 06/2020 cMRI: mild AI.   Arthritis    Asthma    CAD (coronary artery disease)    a. 1996 s/p PTCA;  b. 06/2011 Cath: LCX 50, RCA 100 CTO; c. 03/2015 PCI: LAD 70, LCX 15m w/ abnl FFR (3.0x12 Synergy & 2.5x8 Synergy DESs); d. 01/2018 Cath: stable anatomy, patent LCX stents; e. 04/2020 Cath: LM nl, LAD 40p, 27m, D2 70ost, LCX 40ost, patent stents, RCA 70p/m, 100d. RPAV fills via R->R collats. RPL2 fills via L->R collats-->Med Rx.   Carotid arterial disease ([place])    a. 05/2020 Carotid U/S: <50% bilat ICA stenoses.   Chronic cough    Depression    Diverticulosis    Dyspnea    Elevated LFTs 2010   s/p ultrasound w/ possible fatty liver; GI consult. Resolution 2011   Gastric ulcer    GERD (gastroesophageal reflux disease)    History  of colonic polyps    Dr. Vira Agar   [facility] (hard of hearing)    Hyperlipidemia    Hypertension    Peritoneal carcinomatosis [facility]) 2001   Dr. Oliva Bustard & Dr. Claiborne Rigg s/p chemo   PSVT (paroxysmal supraventricular tachycardia) ([place])    a. 06/2020 Zio: 52 episodes of SVT (longest 59 secs; fastest 159 bpm).   Severe aortic stenosis    a. 05/2020 Cor CTA: Tricuspid AoV w/ sev reduced cusp separation and Ca2+; b. 06/2020 cMRI: Sev AS w/ mild AI. Mod MR. EF 69% w/ sev asymmetric LVH. Nl RV fxn; c. 11/2020 Echo: EF 60-65%, no rwma, Gr1 DD. Nl RV size/fxn. Mild MR. Mod-Sev AS (appears severe visually) AVA 0.94cm^2.   Shingles 2008   T-9 distribution   Syncope    a. 06/2020 Zio: 7 beats WCT (NSVT). 52 episodes of SVT (longest 59 secs, fastest 159 bpm).   Vitamin D deficiency    Wheezing     Past Surgical History:  Procedure Laterality Date   ABDOMINAL HYSTERECTOMY  2001   APPENDECTOMY     CARDIAC CATHETERIZATION  2007   50% mid LAD stenosis, 70% proximal RCA with 100% distal RCA stenosis with collaterals, EF 65%   CARDIAC CATHETERIZATION N/A 04/27/2015   Procedure: Left Heart Cath and Coronary Angiography;  Surgeon: Jettie Booze, MD;  Location: [place] [facility];  Service: Cardiovascular;  Laterality: N/A;   CARDIAC CATHETERIZATION N/A 04/27/2015   Procedure: Coronary Stent Intervention;  Surgeon: Jettie Booze, MD;  Location: [place] [facility];  Service: Cardiovascular;  Laterality: N/A;   CATARACT EXTRACTION W/PHACO Left 09/25/2016   Procedure: CATARACT EXTRACTION PHACO AND INTRAOCULAR LENS PLACEMENT (IOC);  Surgeon: Eulogio Bear, MD;  Location: [facility];  Service: Ophthalmology;  Laterality: Left;  [place] 46.7 AP% 8.1 CDE 3.80 Fluid pack lot # [id number]   CATARACT EXTRACTION W/PHACO Right 11/06/2016   Procedure: CATARACT EXTRACTION PHACO AND INTRAOCULAR LENS PLACEMENT (IOC);  Surgeon: Eulogio Bear, MD;  Location: [facility];  Service: Ophthalmology;  Laterality: Right;  [place]  49.5 AP8.7 CDE4.28 LOT # [id number]   CHOLECYSTECTOMY  1987   COLECTOMY  2001   CORONARY ANGIOPLASTY  1996   CORONARY ANGIOPLASTY     LAPAROSCOPIC BILATERAL SALPINGO OOPHERECTOMY  1988   Dr. [place] CATH AND CORONARY ANGIOGRAPHY N/A 01/25/2018   Procedure: LEFT HEART CATH AND CORONARY ANGIOGRAPHY;  Surgeon: Wellington Hampshire, MD;  Location: [place] [facility];  Service: Cardiovascular;  Laterality: N/A;   RIGHT/LEFT HEART CATH AND CORONARY ANGIOGRAPHY N/A 05/16/2020   Procedure: RIGHT/LEFT HEART CATH AND CORONARY ANGIOGRAPHY;  Surgeon: Wellington Hampshire, MD;  Location: [place] [facility];  Service: Cardiovascular;  Laterality: N/A;   TONSILLECTOMY  1950    Family Psychiatric History: Please see initial evaluation for full details. I have reviewed the history. No updates at this time.     Family History:  Family History  Problem Relation Age of Onset   Hypertension Mother    Stroke Mother 52       Cerebral Hemorrhage   Cancer Father        bone cancer   Diabetes Sister    Diabetes Brother    Heart disease Brother    Cancer Daughter 51       Ovarian cancer   Colon cancer Neg Hx    Colon polyps Neg Hx     Social History:  Social History   Socioeconomic History   Marital status: Widowed    Spouse name: Not on file   Number of children: 3   Years of education: Not on file   Highest education level: Not on file  Occupational History   Occupation: [employer]    Comment: Retired   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 She is widowed and has a daughter Amy Morton), son Amy Morton). She also had a son that was killed in  a work related accident Amy Morton - died age).       Caffeine - none   Exercise - walking   Social Determinants of Health   Financial Resource Strain: Not on file  Food Insecurity: Not on file  Transportation Needs: Not on file  Physical Activity: Not on file  Stress: Not on file  Social Connections: Not on file    Allergies:  Allergies  Allergen Reactions   Abilify [Aripiprazole] Other (See Comments)    Burning in Chest Elevated b/p Insomnia   Celexa [Citalopram]     Caused low sodium level.    Ibuprofen Other (See Comments)    GIB    Metabolic Disorder Labs: Lab Results  Component Value Date   HGBA1C 6.0 (H) 06/06/2020   MPG 125.5 06/06/2020   No results found for: PROLACTIN Lab Results  Component Value Date   CHOL 95 06/06/2020   TRIG 68 06/06/2020   HDL 41 06/06/2020   CHOLHDL 2.3 06/06/2020   VLDL 14 06/06/2020   LDLCALC 40 06/06/2020   LDLCALC 20 09/13/2018   Lab Results  Component Value Date   TSH 2.509 06/06/2020   TSH 1.54 02/10/2020    Therapeutic Level Labs: No results found for: LITHIUM No results found for: VALPROATE No components found for:  CBMZ  Current Medications: Current Outpatient Medications  Medication Sig Dispense Refill   acetaminophen (TYLENOL) 500 MG tablet Take 1,000 mg by mouth in the morning and at bedtime.     alendronate (FOSAMAX) 70 MG tablet Take 1 tablet (70 mg total) by mouth every 7 (seven) days. Take with a full glass of water on an empty stomach. 4 tablet 11   Ascorbic Acid (VITAMIN C PO) Take by mouth.     aspirin 81 MG tablet Take 81 mg by mouth daily.     atorvastatin (LIPITOR) 40 MG tablet Take 1 tablet (40  mg total) by mouth daily. 90 tablet 3   Calcium Carbonate-Vit D-Min (CALCIUM 1200 PO) Take 1,200 mg by mouth daily.     carvedilol (COREG) 6.25 MG tablet Take 1 tablet (6.25 mg total) by mouth 2 (two) times daily with a meal. 60 tablet 3   Cholecalciferol 5000 units TABS Take 5,000 Units by mouth daily.      colestipol (COLESTID) 1 g tablet TAKE 1 TABLET(1 GRAM) BY MOUTH DAILY 90 tablet 1   LORazepam (ATIVAN) 0.5 MG tablet Take 1 tablet (0.5 mg total) by mouth 2 (two) times daily as needed for anxiety. 60 tablet 0   Multiple Vitamin (MULTIVITAMIN) capsule Take 1 capsule by mouth daily.     nitroGLYCERIN (NITROSTAT) 0.4 MG SL tablet PLACE 1 TABLET UNDER THE TONGUE EVERY 5 MINUTES AS NEEDED FOR CHEST PAIN, 3 DOSES MAX 25 tablet 1   Nutritional Supplements (OSTEO ADVANCE) TABS Take 1 tablet by mouth daily.      omega-3 acid ethyl esters (LOVAZA) 1 g capsule TAKE 2 CAPSULES BY MOUTH TWICE DAILY 360 capsule 1   pantoprazole (PROTONIX) 40 MG tablet TAKE 1 TABLET(40 MG) BY MOUTH DAILY 90 tablet 1   potassium chloride SA (KLOR-CON) 20 MEQ tablet Take 1 tablet (20 mEq total) by mouth daily. 90 tablet 3   Ubiquinol 100 MG CAPS Take 100 mg by mouth every morning.     vitamin B-12 (CYANOCOBALAMIN) 250 MCG tablet Take 250 mcg by mouth daily.     isosorbide mononitrate (IMDUR) 30 MG 24 hr tablet Take 2 tablets (60 mg total) by mouth daily. 60 tablet 0   No current facility-administered medications for this visit.   Facility-Administered Medications Ordered in Other Visits  Medication Dose Route Frequency Provider Last Rate Last Admin   sodium chloride 0.9 % nebulizer solution 3 mL  3 mL Nebulization Once [name], MD       Followed by   methacholine (PROVOCHOLINE) inhaler solution 0.125 mg  2 mL Inhalation Once [name], MD       Followed by   methacholine (PROVOCHOLINE) inhaler solution 0.5 mg  2 mL Inhalation Once [name], MD       Followed by   methacholine (PROVOCHOLINE) inhaler solution 2 mg  2 mL Inhalation Once [name], MD       Followed by   methacholine (PROVOCHOLINE) inhaler solution 8 mg  2 mL Inhalation Once [name], MD       Followed by   methacholine (PROVOCHOLINE) inhaler solution 32 mg  2 mL Inhalation Once [name], MD          Musculoskeletal: Strength & Muscle Tone:  normal Gait & Station: normal Patient leans: N/A  Psychiatric Specialty Exam: Review of Systems  Psychiatric/Behavioral:  Positive for dysphoric mood. Negative for agitation, behavioral problems, confusion, decreased concentration, hallucinations, self-injury, sleep disturbance and suicidal ideas. The patient is nervous/anxious. The patient is not hyperactive.   All other systems reviewed and are negative.  Blood pressure (!) 188/83, pulse 83, temperature 97.6 F (36.4 C), temperature source Temporal, weight 117 lb 9.6 oz (53.3 kg).Body mass index is 24.58 kg/m.  General Appearance: Fairly Groomed  Eye Contact:  Good  Speech:  Clear and Coherent  Volume:  Normal  Mood:   okay  Affect:  Appropriate, Congruent, and Restricted  Thought Process:  Coherent  Orientation:  Full (Time, Place, and Person)  Thought Content: Logical   Suicidal Thoughts:  No  Homicidal Thoughts:  No  Memory:  Immediate;   Fair  Judgement:  Good  Insight:  Present  Psychomotor Activity:  Normal  Concentration:  Concentration: Good and Attention Span: Good  Recall:  Good  Fund of Knowledge: Good  Language: Good  Akathisia:  No  Handed:  Right  AIMS (if indicated): not done  Assets:  Communication Skills Desire for Improvement  ADL's:  Intact  Cognition: WNL  Sleep:  Good   Screenings: GAD-7    Flowsheet [place] Office Visit from 02/08/2018 in [facility]  Total GAD-7 Score [place] Office Visit from 04/24/2020 in [facility] from 03/05/2018 in [facility] from 03/04/2017 in [facility] from 03/04/2016 in [facility]  Total Score (max 30 points ) 28 28 30 29       PHQ2-9    [place] Visit from 05/13/2021 in [place] Visit from  04/08/2021 in [facility] Visit from 03/25/2021 in [facility] from 11/22/2020 in [place] Office Visit from 08/20/2020 in [place]  PHQ-2 Total Score 4 6 2  0 0  PHQ-9 Total Score 13 18 17  -- --      [place] Visit from 05/13/2021 in [facility] ED from 04/02/2021 in [facility] ED to Hosp-Admission (Discharged) from 12/05/2020 in [facility]  C-SSRS RISK CATEGORY Error: Question 6 not populated No Risk No Risk        Assessment and Plan:  ALIANAH LOFTON is a 86 y.o. year old female with a history of depression, anxiety, CAD s/p PCI in 2016,  severe aortic stenosis, HFpEF,PSVT,  hypertension, hyperlipidemia, lacunar infarcts, who presents for follow up appointment for below.   1. Moderate episode of recurrent major depressive disorder ([place]) 3. Anxiety state There has been slight improvement in anxiety since started on lorazepam. Psychosocial stressors includes stress secondary to memory loss , loss of her daughter few years ago from cancer.  She is a widow, and lost her son several years ago as well.  She has good support from her son, and her sister in law/friend/caregiver.  Although [facility] referral was discussed again, the patient is not interested at this time.  Will start BuSpar to target anxiety.  This medication is chosen given it does not have any report about adverse reaction of hyponatremia.  Will continue lorazepam as needed for anxiety.   2. Cognitive decline She continues to have short-term/long-term memory loss, and she does have cognitive deficits as evidenced by mini cog.  Etiology is multifactorial, which includes lacunar infarct.  The patient and the caregiver is not interested in neuropsychological testing.  Will not start cholinesterase inhibitor given its potential  cardiac side effect.   # Hypertension She has significant hypertension, although she is not in any acute distress.  The patient and the caregiver was advised to contact their provider for follow-up especially given her cardiac condition.    Plan Start buspirone 5 mg daily for one week, then 5 mg twice a day  Continue clonazepam 0.5 mg twice daily as needed for anxiety Next appointment: 11/21 at 4 PM for 30 mins, video   The patient demonstrates the following risk factors for suicide: Chronic risk factors for suicide include: psychiatric  disorder of depression . Acute risk factors for suicide include: unemployment and loss (financial, interpersonal, professional). Protective factors for this patient include: positive social support and hope for the future. Considering these factors, the overall suicide risk at this point appears to be low. Patient is appropriate for outpatient follow up.     Norman Clay, MD 05/13/2021, 4:08 PM

## 2021-05-13 ENCOUNTER — Other Ambulatory Visit: Payer: Self-pay

## 2021-05-13 ENCOUNTER — Ambulatory Visit (INDEPENDENT_AMBULATORY_CARE_PROVIDER_SITE_OTHER): Payer: Medicare Other | Admitting: Psychiatry

## 2021-05-13 ENCOUNTER — Encounter: Payer: Self-pay | Admitting: Psychiatry

## 2021-05-13 VITALS — BP 188/83 | HR 83 | Temp 97.6°F | Wt 117.6 lb

## 2021-05-13 DIAGNOSIS — F411 Generalized anxiety disorder: Secondary | ICD-10-CM | POA: Diagnosis not present

## 2021-05-13 DIAGNOSIS — F331 Major depressive disorder, recurrent, moderate: Secondary | ICD-10-CM | POA: Diagnosis not present

## 2021-05-13 DIAGNOSIS — R4189 Other symptoms and signs involving cognitive functions and awareness: Secondary | ICD-10-CM | POA: Diagnosis not present

## 2021-05-13 MED ORDER — BUSPIRONE HCL 5 MG PO TABS: ORAL_TABLET | ORAL | 1 refills | Status: DC

## 2021-05-13 NOTE — Patient Instructions (Addendum)
Start buspirone 5 mg daily for one week, then 5 mg twice a day  Continue clonazepam 0.5 mg twice daily as needed for anxiety Next appointment: 11/21 at 4 PM, video Here is a resource about [facility] (this is the place where the referral will be sent if you are interested in this procedure)  [email or web address]

## 2021-05-14 ENCOUNTER — Ambulatory Visit: Payer: Medicare Other | Admitting: Cardiovascular Disease

## 2021-05-17 ENCOUNTER — Ambulatory Visit (INDEPENDENT_AMBULATORY_CARE_PROVIDER_SITE_OTHER): Payer: Self-pay | Admitting: Podiatry

## 2021-05-17 ENCOUNTER — Other Ambulatory Visit: Payer: Self-pay

## 2021-05-17 DIAGNOSIS — B351 Tinea unguium: Secondary | ICD-10-CM

## 2021-05-17 DIAGNOSIS — M79675 Pain in left toe(s): Secondary | ICD-10-CM

## 2021-05-17 DIAGNOSIS — M79674 Pain in right toe(s): Secondary | ICD-10-CM

## 2021-05-21 ENCOUNTER — Encounter: Payer: Self-pay | Admitting: Family Medicine

## 2021-05-21 ENCOUNTER — Ambulatory Visit (INDEPENDENT_AMBULATORY_CARE_PROVIDER_SITE_OTHER): Payer: Medicare Other | Admitting: Family Medicine

## 2021-05-21 ENCOUNTER — Other Ambulatory Visit: Payer: Self-pay

## 2021-05-21 ENCOUNTER — Encounter: Payer: Self-pay | Admitting: Cardiovascular Disease

## 2021-05-21 ENCOUNTER — Ambulatory Visit (INDEPENDENT_AMBULATORY_CARE_PROVIDER_SITE_OTHER): Payer: Medicare Other | Admitting: Cardiovascular Disease

## 2021-05-21 VITALS — BP 140/60 | HR 93 | Ht 59.0 in | Wt 116.0 lb

## 2021-05-21 VITALS — BP 130/80 | HR 78 | Temp 98.4°F | Ht <= 58 in | Wt 114.4 lb

## 2021-05-21 DIAGNOSIS — Z9861 Coronary angioplasty status: Secondary | ICD-10-CM

## 2021-05-21 DIAGNOSIS — E785 Hyperlipidemia, unspecified: Secondary | ICD-10-CM | POA: Diagnosis not present

## 2021-05-21 DIAGNOSIS — E871 Hypo-osmolality and hyponatremia: Secondary | ICD-10-CM | POA: Diagnosis not present

## 2021-05-21 DIAGNOSIS — I10 Essential (primary) hypertension: Secondary | ICD-10-CM | POA: Diagnosis not present

## 2021-05-21 DIAGNOSIS — I350 Nonrheumatic aortic (valve) stenosis: Secondary | ICD-10-CM | POA: Diagnosis not present

## 2021-05-21 DIAGNOSIS — F419 Anxiety disorder, unspecified: Secondary | ICD-10-CM | POA: Diagnosis not present

## 2021-05-21 DIAGNOSIS — F32A Depression, unspecified: Secondary | ICD-10-CM | POA: Diagnosis not present

## 2021-05-21 DIAGNOSIS — I2510 Atherosclerotic heart disease of native coronary artery without angina pectoris: Secondary | ICD-10-CM

## 2021-05-21 DIAGNOSIS — I25118 Atherosclerotic heart disease of native coronary artery with other forms of angina pectoris: Secondary | ICD-10-CM | POA: Diagnosis not present

## 2021-05-21 LAB — BASIC METABOLIC PANEL
BUN: 10 mg/dL (ref 6–23)
CO2: 25 mEq/L (ref 19–32)
Calcium: 9.6 mg/dL (ref 8.4–10.5)
Chloride: 95 mEq/L — ABNORMAL LOW (ref 96–112)
Creatinine, Ser: 0.7 mg/dL (ref 0.40–1.20)
GFR: 79.6 mL/min (ref >60.00)
Glucose, Bld: 114 mg/dL — ABNORMAL HIGH (ref 70–99)
Potassium: 3.9 mEq/L (ref 3.5–5.1)
Sodium: 129 mEq/L — ABNORMAL LOW (ref 135–145)

## 2021-05-21 NOTE — Patient Instructions (Signed)
Nice to see you. We will see what your sodium level is today and then determine what to do next.

## 2021-05-21 NOTE — Assessment & Plan Note (Signed)
Anxiety is generally stable.  She is following with psychiatry.  She will continue her lorazepam as prescribed.  She will continue on BuSpar.  I did discuss that the BuSpar could take weeks and weeks to become beneficial.

## 2021-05-21 NOTE — Assessment & Plan Note (Signed)
Generally stable.  She is going to see cardiology later today.  Given her aortic stenosis I am going to defer any changes to them regarding her blood pressure.  She will continue Imdur and carvedilol.

## 2021-05-21 NOTE — Assessment & Plan Note (Signed)
Recheck today.  She will continue Gatorade twice daily.  They have declined any further work-up for cortisol issues contributing to this.  Consider sodium chloride tablets if needed.

## 2021-05-21 NOTE — Progress Notes (Signed)
Cardiology Office Note   Date:  05/21/2021   ID:  Amy Morton, DOB 01-19-1935, MRN [id number]  PCP:  [name], MD  Cardiologist:   Kathlyn Sacramento, MD   Chief Complaint  Patient presents with   Other    3 month f/u no complaints today. Meds reviewed verbally with pt.      History of Present Illness: Amy Morton is a 85 y.o. female who presents for for a follow-up visit regarding coronary artery disease and moderate aortic stenosis. Other medical problems include anemia, hypertension, hyperlipidemia, anxiety and depression. She had previous left circumflex PCI in 2016 with known occluded distal right coronary artery. The patient was hospitalized in June 2019 at [facility] with unstable angina.  She underwent cardiac catheterization which showed patent left circumflex stents without significant restenosis, stable discrete 70% stenosis in the mid LAD and chronically occluded distal RCA with collaterals.  Aortic stenosis was moderate with a peak to peak gradient of 20 mmHg.  LVEDP was mildly elevated.  Overall, there was no significant change in coronary anatomy since 2016. She had worsening anginal symptoms last year.  Right and left cardiac catheterization was performed in October 2021 which showed low filling pressures, normal pulmonary pressure, normal cardiac output and severe aortic stenosis with mean gradient of 30 mmHg and valve area of 0.82 square.  Coronary anatomy was not changed. She was hospitalized in November with TIA.  The patient was seen by our TAVR team.  She underwent PYP scan which was not consistent with T TR amyloid.  It was felt that her aortic stenosis was moderate and there was concern about the patient's memory and continued functional decline.  She has been doing reasonably well from a cardiac standpoint with no reported chest pain or worsening dyspnea.  She had issues recently with hyponatremia but sodium has been stable around 129.   Past  Medical History:  Diagnosis Date   (HFpEF) heart failure with preserved ejection fraction ([place])    a. 08/2020 PET: Not suggestive of transthyretin amyloidosis; b. 11/2020 Echo: 60-65%, gr1 DD.   Abnormal mammogram 2007   Recommend close follow up   Anemia    Iron, b12, SPEP, UPEP normal, 12/2012   Anxiety    Aortic insufficiency    a. 06/2020 cMRI: mild AI.   Arthritis    Asthma    CAD (coronary artery disease)    a. 1996 s/p PTCA; b. 06/2011 Cath: LCX 50, RCA 100 CTO; c. 03/2015 PCI: LAD 70, LCX 80m w/ abnl FFR (3.0x12 Synergy & 2.5x8 Synergy DESs); d. 01/2018 Cath: stable anatomy, patent LCX stents; e. 04/2020 Cath: LM nl, LAD 40p, 62m, D2 70ost, LCX 40ost, patent stents, RCA 70p/m, 100d. RPAV fills via R->R collats. RPL2 fills via L->R collats-->Med Rx.   Carotid arterial disease ([place])    a. 05/2020 Carotid U/S: <50% bilat ICA stenoses.   Chronic cough    Depression    Diverticulosis    Dyspnea    Elevated LFTs 2010   s/p ultrasound w/ possible fatty liver; GI consult. Resolution 2011   Gastric ulcer    GERD (gastroesophageal reflux disease)    History of colonic polyps    Dr. Vira Agar   [facility] (hard of hearing)    Hyperlipidemia    Hypertension    Peritoneal carcinomatosis [facility]) 2001   Dr. Oliva Bustard & Dr. Claiborne Rigg s/p chemo   PSVT (paroxysmal supraventricular tachycardia) ([place])    a. 06/2020 Zio: 52 episodes  of SVT (longest 59 secs; fastest 159 bpm).   Severe aortic stenosis    a. 05/2020 Cor CTA: Tricuspid AoV w/ sev reduced cusp separation and Ca2+; b. 06/2020 cMRI: Sev AS w/ mild AI. Mod MR. EF 69% w/ sev asymmetric LVH. Nl RV fxn; c. 11/2020 Echo: EF 60-65%, no rwma, Gr1 DD. Nl RV size/fxn. Mild MR. Mod-Sev AS (appears severe visually) AVA 0.94cm^2.   Shingles 2008   T-9 distribution   Syncope    a. 06/2020 Zio: 7 beats WCT (NSVT). 52 episodes of SVT (longest 59 secs, fastest 159 bpm).   Vitamin D deficiency    Wheezing     Past Surgical History:  Procedure Laterality  Date   ABDOMINAL HYSTERECTOMY  2001   APPENDECTOMY     CARDIAC CATHETERIZATION  2007   50% mid LAD stenosis, 70% proximal RCA with 100% distal RCA stenosis with collaterals, EF 65%   CARDIAC CATHETERIZATION N/A 04/27/2015   Procedure: Left Heart Cath and Coronary Angiography;  Surgeon: Jettie Booze, MD;  Location: [place] [facility];  Service: Cardiovascular;  Laterality: N/A;   CARDIAC CATHETERIZATION N/A 04/27/2015   Procedure: Coronary Stent Intervention;  Surgeon: Jettie Booze, MD;  Location: [place] [facility];  Service: Cardiovascular;  Laterality: N/A;   CATARACT EXTRACTION W/PHACO Left 09/25/2016   Procedure: CATARACT EXTRACTION PHACO AND INTRAOCULAR LENS PLACEMENT (IOC);  Surgeon: Eulogio Bear, MD;  Location: [facility] ORS;  Service: Ophthalmology;  Laterality: Left;  [place] 46.7 AP% 8.1 CDE 3.80 Fluid pack lot # [id number]   CATARACT EXTRACTION W/PHACO Right 11/06/2016   Procedure: CATARACT EXTRACTION PHACO AND INTRAOCULAR LENS PLACEMENT (IOC);  Surgeon: Eulogio Bear, MD;  Location: [facility] ORS;  Service: Ophthalmology;  Laterality: Right;  [place] 49.5 AP8.7 CDE4.28 LOT # [id number]   CHOLECYSTECTOMY  1987   COLECTOMY  2001   CORONARY ANGIOPLASTY  1996   CORONARY ANGIOPLASTY     LAPAROSCOPIC BILATERAL SALPINGO OOPHERECTOMY  1988   Dr. [place] CATH AND CORONARY ANGIOGRAPHY N/A 01/25/2018   Procedure: LEFT HEART CATH AND CORONARY ANGIOGRAPHY;  Surgeon: Wellington Hampshire, MD;  Location: [place] [facility];  Service: Cardiovascular;  Laterality: N/A;   RIGHT/LEFT HEART CATH AND CORONARY ANGIOGRAPHY N/A 05/16/2020   Procedure: RIGHT/LEFT HEART CATH AND CORONARY ANGIOGRAPHY;  Surgeon: Wellington Hampshire, MD;  Location: [place] [facility];  Service: Cardiovascular;  Laterality: N/A;   TONSILLECTOMY  1950     Current Outpatient Medications  Medication Sig Dispense Refill   acetaminophen (TYLENOL) 500 MG tablet Take 1,000 mg by mouth in the morning and at bedtime.      Ascorbic Acid (VITAMIN C PO) Take by mouth.     aspirin 81 MG tablet Take 81 mg by mouth daily.     atorvastatin (LIPITOR) 40 MG tablet Take 1 tablet (40 mg total) by mouth daily. 90 tablet 3   busPIRone (BUSPAR) 5 MG tablet 5 mg daily for one week, then 5 mg twice a day 60 tablet 1   Calcium Carbonate-Vit D-Min (CALCIUM 1200 PO) Take 1,200 mg by mouth daily.     carvedilol (COREG) 6.25 MG tablet Take 1 tablet (6.25 mg total) by mouth 2 (two) times daily with a meal. 60 tablet 3   Cholecalciferol 5000 units TABS Take 5,000 Units by mouth daily.     colestipol (COLESTID) 1 g tablet TAKE 1 TABLET(1 GRAM) BY MOUTH DAILY 90 tablet 1   isosorbide mononitrate (IMDUR) 30 MG 24  hr tablet Take 2 tablets (60 mg total) by mouth daily. (Patient taking differently: Take 30 mg by mouth daily.) 60 tablet 0   LORazepam (ATIVAN) 0.5 MG tablet Take 1 tablet (0.5 mg total) by mouth 2 (two) times daily as needed for anxiety. 60 tablet 0   MAGNESIUM-OXIDE 400 (240 Mg) MG tablet Take by mouth in the morning, at noon, and at bedtime.     Multiple Vitamin (MULTIVITAMIN) capsule Take 1 capsule by mouth daily.     nitroGLYCERIN (NITROSTAT) 0.4 MG SL tablet PLACE 1 TABLET UNDER THE TONGUE EVERY 5 MINUTES AS NEEDED FOR CHEST PAIN, 3 DOSES MAX 25 tablet 1   Nutritional Supplements (OSTEO ADVANCE) TABS Take 1 tablet by mouth daily.      omega-3 acid ethyl esters (LOVAZA) 1 g capsule TAKE 2 CAPSULES BY MOUTH TWICE DAILY 360 capsule 1   pantoprazole (PROTONIX) 40 MG tablet TAKE 1 TABLET(40 MG) BY MOUTH DAILY 90 tablet 1   potassium chloride SA (KLOR-CON) 20 MEQ tablet Take 1 tablet (20 mEq total) by mouth daily. 90 tablet 3   Ubiquinol 100 MG CAPS Take 100 mg by mouth every morning.     vitamin B-12 (CYANOCOBALAMIN) 250 MCG tablet Take 250 mcg by mouth daily.     alendronate (FOSAMAX) 70 MG tablet Take 1 tablet (70 mg total) by mouth every 7 (seven) days. Take with a full glass of water on an empty stomach. (Patient not taking:  Reported on 05/21/2021) 4 tablet 11   No current facility-administered medications for this visit.   Facility-Administered Medications Ordered in Other Visits  Medication Dose Route Frequency Provider Last Rate Last Admin   sodium chloride 0.9 % nebulizer solution 3 mL  3 mL Nebulization Once [name], MD       Followed by   methacholine (PROVOCHOLINE) inhaler solution 0.125 mg  2 mL Inhalation Once [name], MD       Followed by   methacholine (PROVOCHOLINE) inhaler solution 0.5 mg  2 mL Inhalation Once [name], MD       Followed by   methacholine (PROVOCHOLINE) inhaler solution 2 mg  2 mL Inhalation Once [name], MD       Followed by   methacholine (PROVOCHOLINE) inhaler solution 8 mg  2 mL Inhalation Once [name], MD       Followed by   methacholine (PROVOCHOLINE) inhaler solution 32 mg  2 mL Inhalation Once [name], MD        Allergies:   Abilify [aripiprazole], Celexa [citalopram], and Ibuprofen    Social History:  The patient  reports that she has never smoked. She has never used smokeless tobacco. She reports that she does not drink alcohol and does not use drugs.   Family History:  The patient's family history includes Cancer in her father; Cancer (age of onset: 6) in her daughter; Diabetes in her brother and sister; Heart disease in her brother; Hypertension in her mother; Stroke (age of onset: 84) in her mother.    ROS:  Please see the history of present illness.   Otherwise, review of systems are positive for none.   All other systems are reviewed and negative.    PHYSICAL EXAM: VS:  BP 140/60 (BP Location: Left Arm, Patient Position: Sitting, Cuff Size: Normal)   Pulse 93   Ht 4\' 11"  (1.499 m)   Wt 116 lb (52.6 kg)   SpO2 97%   BMI 23.43 kg/m  ,  BMI Body mass index is 23.43 kg/m. GEN: Well nourished, well developed, in no acute distress  HEENT: normal  Neck: no JVD, carotid bruits, or  masses Cardiac: RRR; no rubs, or gallops,no edema .  3/6 crescendo decrescendo systolic murmur in the aortic area which is mid peaking. Respiratory:  clear to auscultation bilaterally, normal work of breathing GI: soft, nontender, nondistended, + BS MS: no deformity or atrophy  Skin: warm and dry, no rash Neuro:  Strength and sensation are intact Psych: euthymic mood, full affect   EKG:  EKG is not ordered today.    Recent Labs: 06/06/2020: TSH 2.509 12/05/2020: ALT 21 01/15/2021: Magnesium 1.6 04/02/2021: Hemoglobin 12.6; Platelets 200 05/21/2021: BUN 10; Creatinine, Ser 0.70; Potassium 3.9; Sodium 129    Lipid Panel    Component Value Date/Time   CHOL 95 06/06/2020 0552   CHOL 115 09/03/2017 1049   TRIG 68 06/06/2020 0552   HDL 41 06/06/2020 0552   HDL 50 09/03/2017 1049   CHOLHDL 2.3 06/06/2020 0552   VLDL 14 06/06/2020 0552   LDLCALC 40 06/06/2020 0552   LDLCALC 29 09/03/2017 1049   LDLDIRECT 35.0 02/21/2019 1012      Wt Readings from Last 3 Encounters:  05/21/21 116 lb (52.6 kg)  05/21/21 114 lb 6.4 oz (51.9 kg)  04/24/21 117 lb (53.1 kg)       No flowsheet data found.    ASSESSMENT AND PLAN:  1.  Coronary artery disease involving native coronary arteries without angina: Most recent cardiac catheterization in October of last year showed stable coronary findings with chronically occluded RCA with left-to-right collaterals and patent left circumflex stents.  Recommend continuing medical therapy.    2.  Moderate to severe aortic stenosis: Given gradual memory decline, severe anxiety and poor functional capacity, I agree that she is not a good candidate for TAVR.  3.  Essential hypertension: Blood pressure is reasonably controlled.  Her blood pressure goes up when she is upset.  We should not be too aggressive lowering her blood pressure in the setting of significant aortic stenosis.  4.  Hyperlipidemia: Continue treatment with high-dose atorvastatin.  Most  recent LDL was 29.  She wants to decrease of her medications as much as possible.  I elected to discontinue Lovaza today.  5.  Memory decline: Seems stable compared to most recent visit.     Disposition:   Follow-up with me in 6 months.  Signed,  Kathlyn Sacramento, MD  05/21/2021 3:42 PM    [facility]

## 2021-05-21 NOTE — Patient Instructions (Signed)
Medication Instructions:   Your physician has recommended you make the following change in your medication:    STOP taking your Lovaza.  *If you need a refill on your cardiac medications before your next appointment, please call your pharmacy*   Lab Work: None ordered If you have labs (blood work) drawn today and your tests are completely normal, you will receive your results only by: [place] (if you have MyChart) OR A paper copy in the mail If you have any lab test that is abnormal or we need to change your treatment, we will call you to review the results.   Testing/Procedures: None ordered   Follow-Up: At [facility], you and your health needs are our priority.  As part of our continuing mission to provide you with exceptional heart care, we have created designated Provider Care Teams.  These Care Teams include your primary Cardiologist (physician) and Advanced Practice Providers (APPs -  Physician Assistants and Nurse Practitioners) who all work together to provide you with the care you need, when you need it.  We recommend signing up for the patient portal called "MyChart".  Sign up information is provided on this After Visit Summary.  MyChart is used to connect with patients for Virtual Visits (Telemedicine).  Patients are able to view lab/test results, encounter notes, upcoming appointments, etc.  Non-urgent messages can be sent to your provider as well.   To learn more about what you can do with MyChart, go to [email or web address].    Your next appointment:   6 month(s)  The format for your next appointment:   In Person  Provider:   You may see Kathlyn Sacramento, MD or one of the following Advanced Practice Providers on your designated Care Team:   Murray Hodgkins, NP Christell Faith, PA-C Marrianne Mood, PA-C Cadence Kathlen Mody, [place]   Other Instructions

## 2021-05-21 NOTE — Progress Notes (Signed)
[name], MD Phone: [phone]  Amy Morton is a 86 y.o. female who presents today for follow-up.  Anxiety/sleep: Patient has continue to use lorazepam once at night.  Occasionally she takes a second dose during the day.  She recently saw psychiatry and they placed her on BuSpar.  She has been on this a little less than a week.  They note the lorazepam seems to be beneficial though they are unsure about the BuSpar yet.  Hyponatremia: The patient started drinking 2 Gatorade's a day.  She does have chronic diarrhea.  They have deferred the work-up for a cortisol issue at this time.  Hypertension: She has widely variable blood pressures and does report some up into the 546T systolically.  She is on Imdur and carvedilol.  No chest pain or shortness of breath.  Social History   Tobacco Use  Smoking Status Never  Smokeless Tobacco Never    Current Outpatient Medications on File Prior to Visit  Medication Sig Dispense Refill   acetaminophen (TYLENOL) 500 MG tablet Take 1,000 mg by mouth in the morning and at bedtime.     alendronate (FOSAMAX) 70 MG tablet Take 1 tablet (70 mg total) by mouth every 7 (seven) days. Take with a full glass of water on an empty stomach. 4 tablet 11   Ascorbic Acid (VITAMIN C PO) Take by mouth.     aspirin 81 MG tablet Take 81 mg by mouth daily.     atorvastatin (LIPITOR) 40 MG tablet Take 1 tablet (40 mg total) by mouth daily. 90 tablet 3   busPIRone (BUSPAR) 5 MG tablet 5 mg daily for one week, then 5 mg twice a day 60 tablet 1   Calcium Carbonate-Vit D-Min (CALCIUM 1200 PO) Take 1,200 mg by mouth daily.     carvedilol (COREG) 6.25 MG tablet Take 1 tablet (6.25 mg total) by mouth 2 (two) times daily with a meal. 60 tablet 3   Cholecalciferol 5000 units TABS Take 5,000 Units by mouth daily.     colestipol (COLESTID) 1 g tablet TAKE 1 TABLET(1 GRAM) BY MOUTH DAILY 90 tablet 1   LORazepam (ATIVAN) 0.5 MG tablet Take 1 tablet (0.5 mg total) by mouth  2 (two) times daily as needed for anxiety. 60 tablet 0   MAGNESIUM-OXIDE 400 (240 Mg) MG tablet Take by mouth.     Multiple Vitamin (MULTIVITAMIN) capsule Take 1 capsule by mouth daily.     nitroGLYCERIN (NITROSTAT) 0.4 MG SL tablet PLACE 1 TABLET UNDER THE TONGUE EVERY 5 MINUTES AS NEEDED FOR CHEST PAIN, 3 DOSES MAX 25 tablet 1   Nutritional Supplements (OSTEO ADVANCE) TABS Take 1 tablet by mouth daily.      omega-3 acid ethyl esters (LOVAZA) 1 g capsule TAKE 2 CAPSULES BY MOUTH TWICE DAILY 360 capsule 1   pantoprazole (PROTONIX) 40 MG tablet TAKE 1 TABLET(40 MG) BY MOUTH DAILY 90 tablet 1   potassium chloride SA (KLOR-CON) 20 MEQ tablet Take 1 tablet (20 mEq total) by mouth daily. 90 tablet 3   Ubiquinol 100 MG CAPS Take 100 mg by mouth every morning.     vitamin B-12 (CYANOCOBALAMIN) 250 MCG tablet Take 250 mcg by mouth daily.     isosorbide mononitrate (IMDUR) 30 MG 24 hr tablet Take 2 tablets (60 mg total) by mouth daily. 60 tablet 0   Current Facility-Administered Medications on File Prior to Visit  Medication Dose Route Frequency Provider Last Rate Last Admin   sodium chloride 0.9 % nebulizer  solution 3 mL  3 mL Nebulization Once [name], MD       Followed by   methacholine (PROVOCHOLINE) inhaler solution 0.125 mg  2 mL Inhalation Once [name], MD       Followed by   methacholine (PROVOCHOLINE) inhaler solution 0.5 mg  2 mL Inhalation Once [name], MD       Followed by   methacholine (PROVOCHOLINE) inhaler solution 2 mg  2 mL Inhalation Once [name], MD       Followed by   methacholine (PROVOCHOLINE) inhaler solution 8 mg  2 mL Inhalation Once [name], MD       Followed by   methacholine (PROVOCHOLINE) inhaler solution 32 mg  2 mL Inhalation Once [name], MD         ROS see history of present illness  Objective  Physical Exam Vitals:   05/21/21 1138  BP: 130/80  Pulse: 78  Temp: 98.4 F (36.9 C)  SpO2: 95%     BP Readings from Last 3 Encounters:  05/21/21 130/80  05/13/21 (!) 188/83  04/24/21 138/80   Wt Readings from Last 3 Encounters:  05/21/21 114 lb 6.4 oz (51.9 kg)  05/13/21 117 lb 9.6 oz (53.3 kg)  04/24/21 117 lb (53.1 kg)    Physical Exam Constitutional:      General: She is not in acute distress.    Appearance: She is not diaphoretic.  Cardiovascular:     Rate and Rhythm: Normal rate and regular rhythm.     Heart sounds: Normal heart sounds.  Pulmonary:     Effort: Pulmonary effort is normal.     Breath sounds: Normal breath sounds.  Musculoskeletal:     Right lower leg: No edema.     Left lower leg: No edema.  Skin:    General: Skin is warm and dry.  Neurological:     Mental Status: She is alert.     Assessment/Plan: Please see individual problem list.  Problem List Items Addressed This Visit     Anxiety and depression (Chronic)    Anxiety is generally stable.  She is following with psychiatry.  She will continue her lorazepam as prescribed.  She will continue on BuSpar.  I did discuss that the BuSpar could take weeks and weeks to become beneficial.      Essential hypertension (Chronic)    Generally stable.  She is going to see cardiology later today.  Given her aortic stenosis I am going to defer any changes to them regarding her blood pressure.  She will continue Imdur and carvedilol.      Hyponatremia - Primary (Chronic)    Recheck today.  She will continue Gatorade twice daily.  They have declined any further work-up for cortisol issues contributing to this.  Consider sodium chloride tablets if needed.      Relevant Orders   Basic Metabolic Panel (BMET)     Return in about 3 months (around 08/21/2021).  This visit occurred during the SARS-CoV-2 public health emergency.  Safety protocols were in place, including screening questions prior to the visit, additional usage of staff PPE, and extensive cleaning of exam room while observing appropriate  contact time as indicated for disinfecting solutions.    [name], MD [place]

## 2021-05-23 ENCOUNTER — Other Ambulatory Visit: Payer: Self-pay | Admitting: Family Medicine

## 2021-05-24 MED ORDER — LORAZEPAM 0.5 MG PO TABS: 0.5000 mg | ORAL_TABLET | Freq: Two times a day (BID) | ORAL | 0 refills | Status: DC | PRN

## 2021-05-30 ENCOUNTER — Other Ambulatory Visit: Payer: Self-pay | Admitting: Cardiovascular Disease

## 2021-06-02 ENCOUNTER — Emergency Department: Payer: Medicare Other

## 2021-06-02 ENCOUNTER — Other Ambulatory Visit: Payer: Self-pay

## 2021-06-02 ENCOUNTER — Emergency Department
Admission: EM | Admit: 2021-06-02 | Discharge: 2021-06-02 | Disposition: A | Discharge status: Home / Self Care | Payer: Medicare Other | Attending: Emergency Medicine | Admitting: Emergency Medicine

## 2021-06-02 DIAGNOSIS — Z5321 Procedure and treatment not carried out due to patient leaving prior to being seen by health care provider: Secondary | ICD-10-CM | POA: Diagnosis not present

## 2021-06-02 DIAGNOSIS — I10 Essential (primary) hypertension: Secondary | ICD-10-CM | POA: Insufficient documentation

## 2021-06-02 DIAGNOSIS — R531 Weakness: Secondary | ICD-10-CM | POA: Diagnosis not present

## 2021-06-02 DIAGNOSIS — R42 Dizziness and giddiness: Secondary | ICD-10-CM | POA: Diagnosis not present

## 2021-06-02 LAB — CBC
HCT: 37 % (ref 36.0–46.0)
Hemoglobin: 12.5 g/dL (ref 12.0–15.0)
MCH: 31.4 pg (ref 26.0–34.0)
MCHC: 33.8 g/dL (ref 30.0–36.0)
MCV: 93 fL (ref 80.0–100.0)
Platelets: 205 10*3/uL (ref 150–400)
RBC: 3.98 MIL/uL (ref 3.87–5.11)
RDW: 11.7 % (ref 11.5–15.5)
WBC: 8.5 10*3/uL (ref 4.0–10.5)
nRBC: 0 % (ref 0.0–0.2)

## 2021-06-02 LAB — BASIC METABOLIC PANEL
Anion gap: 13 (ref 5–15)
BUN: 12 mg/dL (ref 8–23)
CO2: 22 mmol/L (ref 22–32)
Calcium: 9.5 mg/dL (ref 8.9–10.3)
Chloride: 94 mmol/L — ABNORMAL LOW (ref 98–111)
Creatinine, Ser: 0.57 mg/dL (ref 0.44–1.00)
GFR, Estimated: >60 mL/min (ref >60)
Glucose, Bld: 133 mg/dL — ABNORMAL HIGH (ref 70–99)
Potassium: 3.9 mmol/L (ref 3.5–5.1)
Sodium: 129 mmol/L — ABNORMAL LOW (ref 135–145)

## 2021-06-02 LAB — TSH: TSH: 1.379 u[IU]/mL (ref 0.350–4.500)

## 2021-06-02 LAB — TROPONIN I (HIGH SENSITIVITY)
Troponin I (High Sensitivity): 26 ng/L — ABNORMAL HIGH (ref <18)
Troponin I (High Sensitivity): 47 ng/L — ABNORMAL HIGH (ref <18)

## 2021-06-02 LAB — PROTIME-INR
INR: 1.1 (ref 0.8–1.2)
Prothrombin Time: 14 seconds (ref 11.4–15.2)

## 2021-06-02 LAB — T4, FREE: Free T4: 0.98 ng/dL (ref 0.61–1.12)

## 2021-06-02 MED ORDER — LORAZEPAM 0.5 MG PO TABS
0.5000 mg | ORAL_TABLET | Freq: Once | ORAL | Status: AC
  Administered 2021-06-02: 0.5 mg via ORAL
  Filled 2021-06-02: qty 1

## 2021-06-02 NOTE — ED Notes (Signed)
Pt noted to have walked out by other patients. Not visualized in lobby.

## 2021-06-02 NOTE — Progress Notes (Signed)
SUBJECTIVE Patient presents to office today complaining of elongated, thickened nails that cause pain while ambulating in shoes.  Patient is unable to trim their own nails.  The most symptomatic toenails are the bilateral great toes that are very painful and tender.  She normally goes to a nail salon but she states that they are unable to trim the great toenails.  She presents for further treatment and evaluation   Past Medical History:  Diagnosis Date   (HFpEF) heart failure with preserved ejection fraction ([place])    a. 08/2020 PET: Not suggestive of transthyretin amyloidosis; b. 11/2020 Echo: 60-65%, gr1 DD.   Abnormal mammogram 2007   Recommend close follow up   Anemia    Iron, b12, SPEP, UPEP normal, 12/2012   Anxiety    Aortic insufficiency    a. 06/2020 cMRI: mild AI.   Arthritis    Asthma    CAD (coronary artery disease)    a. 1996 s/p PTCA; b. 06/2011 Cath: LCX 50, RCA 100 CTO; c. 03/2015 PCI: LAD 70, LCX 22m w/ abnl FFR (3.0x12 Synergy & 2.5x8 Synergy DESs); d. 01/2018 Cath: stable anatomy, patent LCX stents; e. 04/2020 Cath: LM nl, LAD 40p, 73m, D2 70ost, LCX 40ost, patent stents, RCA 70p/m, 100d. RPAV fills via R->R collats. RPL2 fills via L->R collats-->Med Rx.   Carotid arterial disease ([place])    a. 05/2020 Carotid U/S: <50% bilat ICA stenoses.   Chronic cough    Depression    Diverticulosis    Dyspnea    Elevated LFTs 2010   s/p ultrasound w/ possible fatty liver; GI consult. Resolution 2011   Gastric ulcer    GERD (gastroesophageal reflux disease)    History of colonic polyps    Dr. Vira Agar   [facility] (hard of hearing)    Hyperlipidemia    Hypertension    Peritoneal carcinomatosis [facility]) 2001   Dr. Oliva Bustard & Dr. Claiborne Rigg s/p chemo   PSVT (paroxysmal supraventricular tachycardia) ([place])    a. 06/2020 Zio: 52 episodes of SVT (longest 59 secs; fastest 159 bpm).   Severe aortic stenosis    a. 05/2020 Cor CTA: Tricuspid AoV w/ sev reduced cusp separation and Ca2+; b.  06/2020 cMRI: Sev AS w/ mild AI. Mod MR. EF 69% w/ sev asymmetric LVH. Nl RV fxn; c. 11/2020 Echo: EF 60-65%, no rwma, Gr1 DD. Nl RV size/fxn. Mild MR. Mod-Sev AS (appears severe visually) AVA 0.94cm^2.   Shingles 2008   T-9 distribution   Syncope    a. 06/2020 Zio: 7 beats WCT (NSVT). 52 episodes of SVT (longest 59 secs, fastest 159 bpm).   Vitamin D deficiency    Wheezing     OBJECTIVE General Patient is awake, alert, and oriented x 3 and in no acute distress. Derm Skin is dry and supple bilateral. Negative open lesions or macerations. Remaining integument unremarkable. Nails are tender, long, thickened and dystrophic with subungual debris, consistent with onychomycosis, bilateral great toenails.  No signs of infection noted. Vasc  DP and PT pedal pulses palpable bilaterally. Temperature gradient within normal limits.  Neuro Epicritic and protective threshold sensation grossly intact bilaterally.  Musculoskeletal Exam No symptomatic pedal deformities noted bilateral. Muscular strength within normal limits.  ASSESSMENT 1.  Pain due to onychomycosis of toenails both great toes  PLAN OF CARE 1. Patient evaluated today.  2. Instructed to maintain good pedal hygiene and foot care.  3. Mechanical debridement of nails to the great toes bilaterally performed using a nail nipper. Filed  with dremel without incident.  4. Return to clinic in 3 mos.    Edrick Kins, DPM [facility]  Dr. Edrick Kins, DPM    [address], [place] [address]                Office [phone]  Fax [phone]

## 2021-06-02 NOTE — ED Triage Notes (Signed)
Pt to ED via POV with c/o HTN. She feels like her heart is racing. She is having lightheaded and dizziness with it and has had this for years. She is on medication for this. She reports that her son brought her in to be evaluated.

## 2021-06-03 ENCOUNTER — Encounter: Payer: Self-pay | Admitting: Family Medicine

## 2021-06-04 ENCOUNTER — Telehealth: Payer: Self-pay | Admitting: Family Medicine

## 2021-06-04 DIAGNOSIS — R55 Syncope and collapse: Secondary | ICD-10-CM | POA: Diagnosis not present

## 2021-06-04 DIAGNOSIS — E876 Hypokalemia: Secondary | ICD-10-CM | POA: Diagnosis present

## 2021-06-04 DIAGNOSIS — Z6822 Body mass index (BMI) 22.0-22.9, adult: Secondary | ICD-10-CM | POA: Diagnosis not present

## 2021-06-04 DIAGNOSIS — I10 Essential (primary) hypertension: Secondary | ICD-10-CM | POA: Diagnosis not present

## 2021-06-04 DIAGNOSIS — R4182 Altered mental status, unspecified: Secondary | ICD-10-CM | POA: Diagnosis not present

## 2021-06-04 DIAGNOSIS — F419 Anxiety disorder, unspecified: Secondary | ICD-10-CM | POA: Diagnosis present

## 2021-06-04 DIAGNOSIS — F0671 Mild neurocognitive disorder due to known physiological condition with behavioral disturbance: Secondary | ICD-10-CM | POA: Diagnosis not present

## 2021-06-04 DIAGNOSIS — I350 Nonrheumatic aortic (valve) stenosis: Secondary | ICD-10-CM | POA: Diagnosis present

## 2021-06-04 DIAGNOSIS — K219 Gastro-esophageal reflux disease without esophagitis: Secondary | ICD-10-CM | POA: Diagnosis present

## 2021-06-04 DIAGNOSIS — I110 Hypertensive heart disease with heart failure: Secondary | ICD-10-CM | POA: Diagnosis not present

## 2021-06-04 DIAGNOSIS — F05 Delirium due to known physiological condition: Secondary | ICD-10-CM | POA: Diagnosis not present

## 2021-06-04 DIAGNOSIS — E785 Hyperlipidemia, unspecified: Secondary | ICD-10-CM | POA: Diagnosis present

## 2021-06-04 DIAGNOSIS — F02A4 Dementia in other diseases classified elsewhere, mild, with anxiety: Secondary | ICD-10-CM | POA: Diagnosis not present

## 2021-06-04 DIAGNOSIS — Z66 Do not resuscitate: Secondary | ICD-10-CM | POA: Diagnosis present

## 2021-06-04 DIAGNOSIS — Z20822 Contact with and (suspected) exposure to covid-19: Secondary | ICD-10-CM | POA: Diagnosis present

## 2021-06-04 DIAGNOSIS — I160 Hypertensive urgency: Secondary | ICD-10-CM | POA: Diagnosis present

## 2021-06-04 DIAGNOSIS — Z7983 Long term (current) use of bisphosphonates: Secondary | ICD-10-CM | POA: Diagnosis not present

## 2021-06-04 DIAGNOSIS — I2510 Atherosclerotic heart disease of native coronary artery without angina pectoris: Secondary | ICD-10-CM | POA: Diagnosis present

## 2021-06-04 DIAGNOSIS — I5032 Chronic diastolic (congestive) heart failure: Secondary | ICD-10-CM | POA: Diagnosis not present

## 2021-06-04 DIAGNOSIS — R519 Headache, unspecified: Secondary | ICD-10-CM | POA: Diagnosis not present

## 2021-06-04 DIAGNOSIS — J449 Chronic obstructive pulmonary disease, unspecified: Secondary | ICD-10-CM | POA: Diagnosis present

## 2021-06-04 DIAGNOSIS — F0390 Unspecified dementia without behavioral disturbance: Secondary | ICD-10-CM | POA: Diagnosis not present

## 2021-06-04 DIAGNOSIS — I959 Hypotension, unspecified: Secondary | ICD-10-CM | POA: Diagnosis not present

## 2021-06-04 DIAGNOSIS — G458 Other transient cerebral ischemic attacks and related syndromes: Secondary | ICD-10-CM | POA: Diagnosis not present

## 2021-06-04 DIAGNOSIS — F32A Depression, unspecified: Secondary | ICD-10-CM | POA: Diagnosis present

## 2021-06-04 DIAGNOSIS — R778 Other specified abnormalities of plasma proteins: Secondary | ICD-10-CM | POA: Diagnosis not present

## 2021-06-04 NOTE — Telephone Encounter (Signed)
This patient went to the ED though did not stay to be evaluated.  She had labs drawn.  Please call somebody on the patient's DPR to see how she is doing at this time and see what symptoms she had when she went to the ED.

## 2021-06-04 NOTE — Telephone Encounter (Signed)
I called the patient and spoke with her daughter she stated she was taken to the ER because her BP would not go down after taking the Ativan, she stated they did draw labs and she is concerned about the troponin level.  At the hospital they gave her a ativan, the daughter stated her son took her and she did not know that they left before she was seen.  Ranulfo Kall,cma

## 2021-06-04 NOTE — Telephone Encounter (Signed)
Noted.  It looks like the troponin was uptrending.  Given that and the reason she went to the ED I would suggest that she go back to the ED for an in person evaluation and follow-up lab work to ensure that she was not having a heart attack.

## 2021-06-04 NOTE — Telephone Encounter (Signed)
I called the patient and spoke with the sister and she took the patients BP while we were on the phone and the BP was 212/104, I informed the patients sister that she really needed to go to the ER I suggested the [facility] hospital in [place] and she stated they would go their right away.  Brittiny Levitz,cma

## 2021-06-05 ENCOUNTER — Telehealth: Payer: Self-pay

## 2021-06-05 DIAGNOSIS — R4182 Altered mental status, unspecified: Secondary | ICD-10-CM | POA: Diagnosis not present

## 2021-06-05 DIAGNOSIS — I160 Hypertensive urgency: Secondary | ICD-10-CM | POA: Insufficient documentation

## 2021-06-05 NOTE — Telephone Encounter (Signed)
Noted. We can plan to have her follow-up with Dr Tyrell Antonio office to help manage this after she is discharged. We can also follow-up after discharge.

## 2021-06-05 NOTE — Telephone Encounter (Signed)
pt sister called states that her sister in [facility] in [place].  possible side affect to medication . pt states she felt crazy, crying, had weak spell, heart rate to high and bp increased.   Pt sister was told that dr. Modesta Messing was not in the office this week and that covering provider would be notified. She was advised to follow the instruction given to then at the hospital.   Also mailed out a release of information so we can get hospital records.

## 2021-06-05 NOTE — Telephone Encounter (Signed)
I called and spoke with the patients sister and the patient was admitted and she is waiting to hear from the nurse on what is going on.  Also the sister wants to know if you and Dr. Fletcher Anon can come up with a plan to keep her from going to the hospital so much, she wants to know if its something she can do for her at home to keep her out of the hospital, she stated the cardiologist stated she could not have the surgery to fix the valve in her hear, therefor the patient would be experiencing more chest pain and breathing issues and she stated the patient is not having those issues its only her BP and she wants to know what she can do to hepl her at home.  Terilyn Sano,cma

## 2021-06-05 NOTE — Telephone Encounter (Signed)
Noted. It appears she may be admitted in [place]. Please follow up with her sister to confirm this. Thanks.

## 2021-06-05 NOTE — Telephone Encounter (Signed)
Please ask patient to continue recommendations as per ED provider and please schedule a sooner appointment for this patient with Dr.Hisada .

## 2021-06-10 ENCOUNTER — Ambulatory Visit (INDEPENDENT_AMBULATORY_CARE_PROVIDER_SITE_OTHER): Payer: Medicare Other

## 2021-06-10 ENCOUNTER — Telehealth: Payer: Self-pay

## 2021-06-10 VITALS — BP 167/81 | HR 85 | Ht 59.0 in | Wt 108.0 lb

## 2021-06-10 DIAGNOSIS — Z0000 Encounter for general adult medical examination without abnormal findings: Secondary | ICD-10-CM

## 2021-06-10 NOTE — Telephone Encounter (Signed)
Transition Care Management Follow-up Telephone Call Date of discharge and from where: 06/07/21-[facility] How have you been since you were released from the hospital? Information provided by patient and Sister, HIPAA compliant. Patient is doing well since discharge. BP checked today is 167/81. Denies headache, chest pain, N/V/D, dyspnea, ABD pain and all other symptoms. Any questions or concerns? No  Items Reviewed: Did the pt receive and understand the discharge instructions provided? Yes  Medications obtained and verified? Yes  Any new allergies since your discharge? No  Dietary orders reviewed? Yes Do you have support at home? Yes   Home Care and Equipment/Supplies: Were home health services ordered? Yes. Awaiting scheduling.  If so, what is the name of the agency? [facility].  Functional Questionnaire: (I = Independent and D = Dependent) ADLs: Family assist as needed.   Bathing/Dressing- I  Meal Prep- Family assist  Eating- I  Maintaining continence- I  Transferring/Ambulation- Walker/cane in use.  Managing Meds- Sister assist  Follow up appointments reviewed:  PCP Hospital f/u appt confirmed? Awaiting call back for scheduling.  None available appointments in the next 1-2 weeks at this time. Deferred to pcp and cma for availability.  Are transportation arrangements needed? No  If their condition worsens, is the pt aware to call PCP or go to the Emergency Dept.? Yes Was the patient provided with contact information for the PCP's office or ED? Yes Was to pt encouraged to call back with questions or concerns? Yes

## 2021-06-10 NOTE — Patient Instructions (Addendum)
[name] , Thank you for taking time to come for your Medicare Wellness Visit. I appreciate your ongoing commitment to your health goals. Please review the following plan we discussed and let me know if I can assist you in the future.   These are the goals we discussed:  Goals      Follow up with Primary Care Provider     As needed        This is a list of the screening recommended for you and due dates:  Health Maintenance  Topic Date Due   Zoster (Shingles) Vaccine (1 of 2) 09/10/2021*   Tetanus Vaccine  03/13/2026   Pneumonia Vaccine  Completed   Flu Shot  Completed   DEXA scan (bone density measurement)  Completed   COVID-19 Vaccine  Completed   HPV Vaccine  Aged Out  *Topic was postponed. The date shown is not the original due date.   Advanced directives: End of life planning; Advance aging; Advanced directives discussed.  Copy of current HCPOA/Living Will requested.    Follow up in one year for your annual wellness visit    Preventive Care 65 Years and Older, Female Preventive care refers to lifestyle choices and visits with your health care provider that can promote health and wellness. What does preventive care include? A yearly physical exam. This is also called an annual well check. Dental exams once or twice a year. Routine eye exams. Ask your health care provider how often you should have your eyes checked. Personal lifestyle choices, including: Daily care of your teeth and gums. Regular physical activity. Eating a healthy diet. Avoiding tobacco and drug use. Limiting alcohol use. Practicing safe sex. Taking low-dose aspirin every day. Taking vitamin and mineral supplements as recommended by your health care provider. What happens during an annual well check? The services and screenings done by your health care provider during your annual well check will depend on your age, overall health, lifestyle risk factors, and family history of disease. Counseling   Your health care provider may ask you questions about your: Alcohol use. Tobacco use. Drug use. Emotional well-being. Home and relationship well-being. Sexual activity. Eating habits. History of falls. Memory and ability to understand (cognition). Work and work Statistician. Reproductive health. Screening  You may have the following tests or measurements: Height, weight, and BMI. Blood pressure. Lipid and cholesterol levels. These may be checked every 5 years, or more frequently if you are over 64 years old. Skin check. Lung cancer screening. You may have this screening every year starting at age 84 if you have a 30-pack-year history of smoking and currently smoke or have quit within the past 15 years. Fecal occult blood test (FOBT) of the stool. You may have this test every year starting at age 84. Flexible sigmoidoscopy or colonoscopy. You may have a sigmoidoscopy every 5 years or a colonoscopy every 10 years starting at age 1. Hepatitis C blood test. Hepatitis B blood test. Sexually transmitted disease (STD) testing. Diabetes screening. This is done by checking your blood sugar (glucose) after you have not eaten for a while (fasting). You may have this done every 1-3 years. Bone density scan. This is done to screen for osteoporosis. You may have this done starting at age 84. Mammogram. This may be done every 1-2 years.  Talk to your health care provider about how often you should have regular mammograms. Talk with your health care provider about your test results, treatment options, and if necessary, the need  for more tests. Vaccines  Your health care provider may recommend certain vaccines, such as: Influenza vaccine. This is recommended every year. Tetanus, diphtheria, and acellular pertussis (Tdap, Td) vaccine. You may need a Td booster every 10 years. Zoster vaccine. You may need this after age 84. Pneumococcal 13-valent conjugate (PCV13) vaccine. One dose is recommended  after age 50. Pneumococcal polysaccharide (PPSV23) vaccine. One dose is recommended after age 84. Talk to your health care provider about which screenings and vaccines you need and how often you need them. This information is not intended to replace advice given to you by your health care provider. Make sure you discuss any questions you have with your health care provider. Document Released: 08/10/2015 Document Revised: 04/02/2016 Document Reviewed: 05/15/2015 Elsevier Interactive Patient Education  2017 [place] Prevention in the Home Falls can cause injuries. They can happen to people of all ages. There are many things you can do to make your home safe and to help prevent falls. What can I do on the outside of my home? Regularly fix the edges of walkways and driveways and fix any cracks. Remove anything that might make you trip as you walk through a door, such as a raised step or threshold. Trim any bushes or trees on the path to your home. Use bright outdoor lighting. Clear any walking paths of anything that might make someone trip, such as rocks or tools. Regularly check to see if handrails are loose or broken. Make sure that both sides of any steps have handrails. Any raised decks and porches should have guardrails on the edges. Have any leaves, snow, or ice cleared regularly. Use sand or salt on walking paths during winter. Clean up any spills in your garage right away. This includes oil or grease spills. What can I do in the bathroom? Use night lights. Install grab bars by the toilet and in the tub and shower. Do not use towel bars as grab bars. Use non-skid mats or decals in the tub or shower. If you need to sit down in the shower, use a plastic, non-slip stool. Keep the floor dry. Clean up any water that spills on the floor as soon as it happens. Remove soap buildup in the tub or shower regularly. Attach bath mats securely with double-sided non-slip rug tape. Do not  have throw rugs and other things on the floor that can make you trip. What can I do in the bedroom? Use night lights. Make sure that you have a light by your bed that is easy to reach. Do not use any sheets or blankets that are too big for your bed. They should not hang down onto the floor. Have a firm chair that has side arms. You can use this for support while you get dressed. Do not have throw rugs and other things on the floor that can make you trip. What can I do in the kitchen? Clean up any spills right away. Avoid walking on wet floors. Keep items that you use a lot in easy-to-reach places. If you need to reach something above you, use a strong step stool that has a grab bar. Keep electrical cords out of the way. Do not use floor polish or wax that makes floors slippery. If you must use wax, use non-skid floor wax. Do not have throw rugs and other things on the floor that can make you trip. What can I do with my stairs? Do not leave any items on the stairs.  Make sure that there are handrails on both sides of the stairs and use them. Fix handrails that are broken or loose. Make sure that handrails are as long as the stairways. Check any carpeting to make sure that it is firmly attached to the stairs. Fix any carpet that is loose or worn. Avoid having throw rugs at the top or bottom of the stairs. If you do have throw rugs, attach them to the floor with carpet tape. Make sure that you have a light switch at the top of the stairs and the bottom of the stairs. If you do not have them, ask someone to add them for you. What else can I do to help prevent falls? Wear shoes that: Do not have high heels. Have rubber bottoms. Are comfortable and fit you well. Are closed at the toe. Do not wear sandals. If you use a stepladder: Make sure that it is fully opened. Do not climb a closed stepladder. Make sure that both sides of the stepladder are locked into place. Ask someone to hold it for  you, if possible. Clearly mark and make sure that you can see: Any grab bars or handrails. First and last steps. Where the edge of each step is. Use tools that help you move around (mobility aids) if they are needed. These include: Canes. Walkers. Scooters. Crutches. Turn on the lights when you go into a dark area. Replace any light bulbs as soon as they burn out. Set up your furniture so you have a clear path. Avoid moving your furniture around. If any of your floors are uneven, fix them. If there are any pets around you, be aware of where they are. Review your medicines with your doctor. Some medicines can make you feel dizzy. This can increase your chance of falling. Ask your doctor what other things that you can do to help prevent falls. This information is not intended to replace advice given to you by your health care provider. Make sure you discuss any questions you have with your health care provider. Document Released: 05/10/2009 Document Revised: 12/20/2015 Document Reviewed: 08/18/2014 Elsevier Interactive Patient Education  2017 [employer].

## 2021-06-10 NOTE — Progress Notes (Addendum)
Subjective:   Amy Morton is a 86 y.o. female who presents for Medicare Annual (Subsequent) preventive examination.  Review of Systems    No ROS.  Medicare Wellness Virtual Visit.  Visual/audio telehealth visit, UTA vital signs.   See social history for additional risk factors.   Cardiac Risk Factors include: advanced age (>19men, >66 women)     Objective:    Today's Vitals   06/10/21 1532  Weight: 108 lb (49 kg)  Height: 4\' 11"  (1.499 m)   Body mass index is 21.81 kg/m.  Advanced Directives 06/10/2021 06/02/2021 12/05/2020 09/11/2020 08/18/2020 07/01/2020 06/06/2020  Does Patient Have a Medical Advance Directive? Yes Yes Unable to assess, patient is non-responsive or altered mental status No Yes Yes No  Type of Advance Directive [place];Living will [place];Living will - - - [place];Living will [place]  Does patient want to make changes to medical advance directive? No - Patient declined No - Patient declined - - - - No - Patient declined  Copy of [place] in Chart? No - copy requested - - - - - No - copy requested  Would patient like information on creating a medical advance directive? - - No - Patient declined No - Patient declined - - No - Patient declined    Current Medications (verified) Outpatient Encounter Medications as of 06/10/2021  Medication Sig   melatonin 3 MG TABS tablet Take by mouth.   nitroGLYCERIN (NITROSTAT) 0.4 MG SL tablet Place under the tongue.   traZODone (DESYREL) 50 MG tablet Take by mouth.   [DISCONTINUED] LORazepam (ATIVAN) 0.5 MG tablet Take by mouth.   acetaminophen (TYLENOL) 500 MG tablet Take 1,000 mg by mouth in the morning and at bedtime.   alendronate (FOSAMAX) 70 MG tablet Take 1 tablet (70 mg total) by mouth every 7 (seven) days. Take with a full glass of water on an empty stomach. (Patient not taking: Reported on 05/21/2021)    Ascorbic Acid (VITAMIN C PO) Take by mouth.   aspirin 81 MG tablet Take 81 mg by mouth daily.   atorvastatin (LIPITOR) 40 MG tablet Take 1 tablet (40 mg total) by mouth daily.   busPIRone (BUSPAR) 5 MG tablet 5 mg daily for one week, then 5 mg twice a day (Patient not taking: Reported on 06/10/2021)   Calcium Carbonate-Vit D-Min (CALCIUM 1200 PO) Take 1,200 mg by mouth daily.   carvedilol (COREG) 6.25 MG tablet Take 1 tablet (6.25 mg total) by mouth 2 (two) times daily with a meal.   Cholecalciferol 5000 units TABS Take 5,000 Units by mouth daily.   colestipol (COLESTID) 1 g tablet TAKE 1 TABLET(1 GRAM) BY MOUTH DAILY   isosorbide mononitrate (IMDUR) 30 MG 24 hr tablet Take 2 tablets (60 mg total) by mouth daily. (Patient taking differently: Take 30 mg by mouth daily.)   LORazepam (ATIVAN) 0.5 MG tablet Take 1 tablet (0.5 mg total) by mouth 2 (two) times daily as needed for anxiety.   MAGNESIUM-OXIDE 400 (240 Mg) MG tablet Take by mouth in the morning, at noon, and at bedtime.   Multiple Vitamin (MULTIVITAMIN) capsule Take 1 capsule by mouth daily.   nitroGLYCERIN (NITROSTAT) 0.4 MG SL tablet PLACE 1 TABLET UNDER THE TONGUE EVERY 5 MINUTES AS NEEDED FOR CHEST PAIN, 3 DOSES MAX   Nutritional Supplements (OSTEO ADVANCE) TABS Take 1 tablet by mouth daily.    pantoprazole (PROTONIX) 40 MG tablet TAKE 1  TABLET(40 MG) BY MOUTH DAILY   potassium chloride SA (KLOR-CON) 20 MEQ tablet Take 1 tablet (20 mEq total) by mouth daily.   Ubiquinol 100 MG CAPS Take 100 mg by mouth every morning.   Facility-Administered Encounter Medications as of 06/10/2021  Medication   sodium chloride 0.9 % nebulizer solution 3 mL   Followed by   methacholine (PROVOCHOLINE) inhaler solution 0.125 mg   Followed by   methacholine (PROVOCHOLINE) inhaler solution 0.5 mg   Followed by   methacholine (PROVOCHOLINE) inhaler solution 2 mg   Followed by   methacholine (PROVOCHOLINE) inhaler solution 8 mg   Followed by    methacholine (PROVOCHOLINE) inhaler solution 32 mg    Allergies (verified) Abilify [aripiprazole], Celexa [citalopram], and Ibuprofen   History: Past Medical History:  Diagnosis Date   (HFpEF) heart failure with preserved ejection fraction ([place])    a. 08/2020 PET: Not suggestive of transthyretin amyloidosis; b. 11/2020 Echo: 60-65%, gr1 DD.   Abnormal mammogram 2007   Recommend close follow up   Anemia    Iron, b12, SPEP, UPEP normal, 12/2012   Anxiety    Aortic insufficiency    a. 06/2020 cMRI: mild AI.   Arthritis    Asthma    CAD (coronary artery disease)    a. 1996 s/p PTCA; b. 06/2011 Cath: LCX 50, RCA 100 CTO; c. 03/2015 PCI: LAD 70, LCX 53m w/ abnl FFR (3.0x12 Synergy & 2.5x8 Synergy DESs); d. 01/2018 Cath: stable anatomy, patent LCX stents; e. 04/2020 Cath: LM nl, LAD 40p, 61m, D2 70ost, LCX 40ost, patent stents, RCA 70p/m, 100d. RPAV fills via R->R collats. RPL2 fills via L->R collats-->Med Rx.   Carotid arterial disease ([place])    a. 05/2020 Carotid U/S: <50% bilat ICA stenoses.   Chronic cough    Depression    Diverticulosis    Dyspnea    Elevated LFTs 2010   s/p ultrasound w/ possible fatty liver; GI consult. Resolution 2011   Gastric ulcer    GERD (gastroesophageal reflux disease)    History of colonic polyps    Dr. Vira Agar   [facility] (hard of hearing)    Hyperlipidemia    Hypertension    Peritoneal carcinomatosis [facility]) 2001   Dr. Oliva Bustard & Dr. Claiborne Rigg s/p chemo   PSVT (paroxysmal supraventricular tachycardia) ([place])    a. 06/2020 Zio: 52 episodes of SVT (longest 59 secs; fastest 159 bpm).   Severe aortic stenosis    a. 05/2020 Cor CTA: Tricuspid AoV w/ sev reduced cusp separation and Ca2+; b. 06/2020 cMRI: Sev AS w/ mild AI. Mod MR. EF 69% w/ sev asymmetric LVH. Nl RV fxn; c. 11/2020 Echo: EF 60-65%, no rwma, Gr1 DD. Nl RV size/fxn. Mild MR. Mod-Sev AS (appears severe visually) AVA 0.94cm^2.   Shingles 2008   T-9 distribution   Syncope    a. 06/2020 Zio: 7 beats  WCT (NSVT). 52 episodes of SVT (longest 59 secs, fastest 159 bpm).   Vitamin D deficiency    Wheezing    Past Surgical History:  Procedure Laterality Date   ABDOMINAL HYSTERECTOMY  2001   APPENDECTOMY     CARDIAC CATHETERIZATION  2007   50% mid LAD stenosis, 70% proximal RCA with 100% distal RCA stenosis with collaterals, EF 65%   CARDIAC CATHETERIZATION N/A 04/27/2015   Procedure: Left Heart Cath and Coronary Angiography;  Surgeon: Jettie Booze, MD;  Location: [place] [facility];  Service: Cardiovascular;  Laterality: N/A;   CARDIAC CATHETERIZATION N/A 04/27/2015  Procedure: Coronary Stent Intervention;  Surgeon: Jettie Booze, MD;  Location: [place] [facility];  Service: Cardiovascular;  Laterality: N/A;   CATARACT EXTRACTION W/PHACO Left 09/25/2016   Procedure: CATARACT EXTRACTION PHACO AND INTRAOCULAR LENS PLACEMENT (IOC);  Surgeon: Eulogio Bear, MD;  Location: [facility];  Service: Ophthalmology;  Laterality: Left;  [place] 46.7 AP% 8.1 CDE 3.80 Fluid pack lot # [id number]   CATARACT EXTRACTION W/PHACO Right 11/06/2016   Procedure: CATARACT EXTRACTION PHACO AND INTRAOCULAR LENS PLACEMENT (IOC);  Surgeon: Eulogio Bear, MD;  Location: [facility] ORS;  Service: Ophthalmology;  Laterality: Right;  [place] 49.5 AP8.7 CDE4.28 LOT # [id number]   CHOLECYSTECTOMY  1987   COLECTOMY  2001   CORONARY ANGIOPLASTY  1996   CORONARY ANGIOPLASTY     LAPAROSCOPIC BILATERAL SALPINGO OOPHERECTOMY  1988   Dr. Fairview CATH AND CORONARY ANGIOGRAPHY N/A 01/25/2018   Procedure: LEFT HEART CATH AND CORONARY ANGIOGRAPHY;  Surgeon: Wellington Hampshire, MD;  Location: [place] [facility];  Service: Cardiovascular;  Laterality: N/A;   RIGHT/LEFT HEART CATH AND CORONARY ANGIOGRAPHY N/A 05/16/2020   Procedure: RIGHT/LEFT HEART CATH AND CORONARY ANGIOGRAPHY;  Surgeon: Wellington Hampshire, MD;  Location: [place] [facility];  Service: Cardiovascular;  Laterality: N/A;   TONSILLECTOMY  1950   Family History   Problem Relation Age of Onset   Hypertension Mother    Stroke Mother 45       Cerebral Hemorrhage   Cancer Father        bone cancer   Diabetes Sister    Diabetes Brother    Heart disease Brother    Cancer Daughter 63       Ovarian cancer   Colon cancer Neg Hx    Colon polyps Neg Hx    Social History   Socioeconomic History   Marital status: Widowed    Spouse name: Not on file   Number of children: 3   Years of education: Not on file   Highest education level: Not on file  Occupational History   Occupation: [employer]    Comment: Retired   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 She is widowed and has a daughter [name]), son [name]). She also had a son that was killed in a work related accident [name] - died age).       Caffeine - none   Exercise - walking   Social Determinants of Health   Financial Resource Strain: Low Risk    Difficulty of Paying Living Expenses: Not hard at all  Food Insecurity: No Food Insecurity   Worried About Charity fundraiser in the Last Year: Never true   Ran Out of Food in the Last Year: Never true  Transportation Needs: No Transportation Needs   Lack of Transportation (Medical): No   Lack of Transportation (Non-Medical): No  Physical Activity: Unknown   Days of Exercise per Week: 0 days   Minutes of Exercise per Session: Not on file  Stress: No Stress Concern Present   Feeling of Stress : Not  at all  Social Connections: Unknown   Frequency of Communication with Friends and Family: Not on file   Frequency of Social Gatherings with Friends and Family: More than three times a week    Attends Religious Services: Not on Electrical engineer or Organizations: Not on file   Attends Archivist Meetings: Not on file   Marital Status: Not on file    Tobacco Counseling Counseling given: Not Answered   Clinical Intake:  Pre-visit preparation completed: Yes        Diabetes: No  How often do you need to have someone help you when you read instructions, pamphlets, or other written materials from your doctor or pharmacy?: [place] Needed?: No      Activities of Daily Living In your present state of health, do you have any difficulty performing the following activities: 06/10/2021 12/05/2020  Hearing? (No Data) -  Comment Some difficulty hearing conversational tones. -  Vision? N -  Comment - -  Difficulty concentrating or making decisions? N -  Comment - -  Walking or climbing stairs? Y -  Comment Unsteady gait. Cane/walker in use when ambulating. -  Dressing or bathing? N -  Doing errands, shopping? Y N  Comment She does not drive or errands alone son helps  Preparing Food and eating ? Y -  Comment Family assist with meal prep. Self feeds. -  Using the Toilet? N -  In the past six months, have you accidently leaked urine? N -  Do you have problems with loss of bowel control? N -  Managing your Medications? Y -  Comment Family assist -  Managing your Finances? Y -  Comment Family assist -  Housekeeping or managing your Housekeeping? Y -  Comment Family assist -  Some recent data might be hidden    Patient Care Team: [name], MD as PCP - General (Family Medicine) Wellington Hampshire, MD as PCP - Cardiology (Cardiology) Sherryl Barters, NP as Nurse Practitioner (Nurse Practitioner)  Indicate any recent Medical Services you may have received from other than [facility] providers in the past year (date may be approximate).     Assessment:   This is a routine wellness examination for [place].  Virtual Visit via  Telephone Note I connected with  NAHJAE HOEG on 06/10/21 at  3:30 PM EST by telephone and verified that I am speaking with the correct person using two identifiers.  Location: Patient: home Provider: office Persons participating in the virtual visit: patient/family assist-Wanda/Nurse Health Advisor   I discussed the limitations, risks, security and privacy concerns of performing an evaluation and management service by telephone and the availability of in person appointments. The patient expressed understanding and agreed to proceed.  Interactive audio and video telecommunications were attempted between this nurse and patient, however failed, due to patient having technical difficulties OR patient did not have access to video capability.  We continued and completed visit with audio only.  Some vital signs may be absent or patient reported.   Hearing/Vision screen Hearing Screening - Comments:: Followed by Carita Pian Liam Rogers) Visits PRN Hearing aid, bilateral States she can hear without issues Vision Screening - Comments:: Followed by [facility] Wears corrective lenses when reading  Cataract extraction, bilateral  They have regular follow up with the ophthalmologist  Dietary issues and exercise activities discussed: Current Exercise Habits: The patient does not participate in regular exercise at present  Regular diet Gatorade intake good   Goals Addressed             This Visit's Progress    Follow up with Primary Care Provider       As needed       Depression Screen [facility] 2/9 Scores 06/10/2021 04/08/2021 11/22/2020 08/20/2020 05/02/2020 04/24/2020 04/24/2020  PHQ - 2 Score 0 6 0 0 - 3 0  PHQ- 9 Score - 18 - - - 4 -  Exception Documentation - - - - Other- indicate reason in comment box - -  Not completed - - - - Currently on treatment. No change since last reported 1 week ago. - -  Some encounter information is confidential and restricted. Go to Review  Flowsheets activity to see all data.    Fall Risk Fall Risk  04/08/2021 11/22/2020 08/23/2020 08/20/2020 05/02/2020  Falls in the past year? 1 0 1 1 0  Comment - - - - -  Number falls in past yr: 1 0 0 0 0  Comment - - - - -  Injury with Fall? 1 - 1 1 -  Risk for fall due to : - - History of fall(s) - -  Follow up Falls evaluation completed Falls evaluation completed Falls evaluation completed Falls evaluation completed Falls evaluation completed    [place]: Home free of loose throw rugs in walkways, pet beds, electrical cords, etc? Yes  Adequate lighting in your home to reduce risk of falls? Yes   ASSISTIVE DEVICES UTILIZED TO PREVENT FALLS: Life alert? No . Patient is never alone.  Use of a cane, walker or w/c? Yes  Grab bars in the bathroom? Yes  Shower chair or bench in shower? Yes   TIMED UP AND GO: Was the test performed? No .   Cognitive Function: Patient is alert.  Vascular dementia.  MMSE - Mini Mental State Exam 06/10/2021 04/24/2020 03/05/2018 03/04/2017 03/04/2016  Not completed: Unable to complete - - - -  Orientation to time - 4 5 5 5   Orientation to Place - 5 5 5 5   Registration - 3 3 3 3   Attention/ Calculation - 4 5 5 5   Recall - 3 1 3 2   Recall-comments - - 1 out of 3 words recalled - -  Language- name 2 objects - 2 2 2 2   Language- repeat - 1 1 1 1   Language- follow 3 step command - 3 3 3 3   Language- read & follow direction - 1 1 1 1   Write a sentence - 1 1 1 1   Copy design - 1 1 1 1   Total score - 28 28 30 29      6CIT Screen 03/08/2019  What Year? 0 points  What month? 0 points  What time? 0 points  Count back from 20 0 points  Months in reverse 0 points  Repeat phrase 0 points  Total Score 0    Immunizations Immunization History  Administered Date(s) Administered   DTaP 06/23/2011   Fluad Quad(high Dose 65+) 04/24/2020, 04/24/2021   Influenza, High Dose Seasonal PF 04/07/2017, 04/07/2019, 05/27/2021    Influenza,inj,Quad PF,6+ Mos 04/17/2014, 05/07/2015, 05/07/2018   Influenza-Unspecified 04/13/2012, 04/08/2013, 04/17/2014, 05/07/2015, 05/02/2016   PFIZER(Purple Top)SARS-COV-2 Vaccination 08/29/2019, 09/19/2019, 04/24/2020   Pneumococcal Conjugate-13 03/04/2016   Pneumococcal Polysaccharide-23 11/20/2005, 11/21/2010   Zoster, Live 01/21/2011   Shingrix Completed?: No.    Education has been provided regarding the importance of this vaccine. Patient has been  advised to call insurance company to determine out of pocket expense if they have not yet received this vaccine. Advised may also receive vaccine at local pharmacy or Health Dept. Verbalized acceptance and understanding.  Screening Tests Health Maintenance  Topic Date Due   Zoster Vaccines- Shingrix (1 of 2) 09/10/2021 (Originally 04/17/1987)   TETANUS/TDAP  03/13/2026   Pneumonia Vaccine 93+ Years old  Completed   INFLUENZA VACCINE  Completed   DEXA SCAN  Completed   COVID-19 Vaccine  Completed   HPV VACCINES  Aged Out    Health Maintenance There are no preventive care reminders to display for this patient.  DG Chest 2 View: Completed 06/02/21  Hepatitis C Screening: does not qualify  Vision Screening: Recommended annual ophthalmology exams for early detection of glaucoma and other disorders of the eye.  Dental Screening: Recommended annual dental exams for proper oral hygiene  Community Resource Referral / Chronic Care Management: CRR required this visit?  No   CCM required this visit?  No      Plan:   Keep all routine maintenance appointments.   I have personally reviewed and noted the following in the patient's chart:   Medical and social history Use of alcohol, tobacco or illicit drugs  Current medications and supplements including opioid prescriptions.  Functional ability and status Nutritional status Physical activity Advanced directives List of other physicians Hospitalizations, surgeries, and ER visits  in previous 12 months Vitals Screenings to include cognitive, depression, and falls Referrals and appointments  In addition, I have reviewed and discussed with patient certain preventive protocols, quality metrics, and best practice recommendations. A written personalized care plan for preventive services as well as general preventive health recommendations were provided to patient.     Varney Biles, LPN   05/24/2535       Agree with plan. Mable Paris, NP

## 2021-06-11 NOTE — Telephone Encounter (Signed)
She could be scheduled on 12/1 at 12 pm.

## 2021-06-11 NOTE — Telephone Encounter (Signed)
Patient is scheduled for 12 pm on 06/27/2021.  Denisa called and informed the patient. Shaquna Geigle,cma

## 2021-06-12 ENCOUNTER — Encounter: Payer: Self-pay | Admitting: Family Medicine

## 2021-06-13 MED ORDER — MIRTAZAPINE 15 MG PO TABS: 7.5000 mg | ORAL_TABLET | Freq: Every day | ORAL | 1 refills | Status: DC

## 2021-06-17 ENCOUNTER — Telehealth: Payer: Medicare Other | Admitting: Psychiatry

## 2021-06-17 DIAGNOSIS — I10 Essential (primary) hypertension: Secondary | ICD-10-CM | POA: Diagnosis not present

## 2021-06-27 ENCOUNTER — Encounter: Payer: Self-pay | Admitting: Family Medicine

## 2021-06-27 ENCOUNTER — Ambulatory Visit (INDEPENDENT_AMBULATORY_CARE_PROVIDER_SITE_OTHER): Payer: Medicare Other | Admitting: Family Medicine

## 2021-06-27 ENCOUNTER — Other Ambulatory Visit: Payer: Self-pay

## 2021-06-27 DIAGNOSIS — I10 Essential (primary) hypertension: Secondary | ICD-10-CM | POA: Diagnosis not present

## 2021-06-27 DIAGNOSIS — F32A Depression, unspecified: Secondary | ICD-10-CM | POA: Diagnosis not present

## 2021-06-27 DIAGNOSIS — R413 Other amnesia: Secondary | ICD-10-CM

## 2021-06-27 DIAGNOSIS — F419 Anxiety disorder, unspecified: Secondary | ICD-10-CM

## 2021-06-27 DIAGNOSIS — E871 Hypo-osmolality and hyponatremia: Secondary | ICD-10-CM

## 2021-06-27 NOTE — Assessment & Plan Note (Signed)
Chronic issues that seem to be somewhat improved with the reintroduction of mirtazapine and tapering up on trazodone.  She will continue mirtazapine 7.5 mg once weekly and trazodone 25 mg during the day and 50 mg at night.  I advised that we can always titrate the mirtazapine dose up though I was hesitant to make too many changes at once given that they just increased the trazodone dose.  If she has persistent or worsening issues with this they can let me know and we can go up on the mirtazapine dose.

## 2021-06-27 NOTE — Progress Notes (Signed)
Virtual Visit via telephone Note  This visit type was conducted due to national recommendations for restrictions regarding the COVID-19 pandemic (e.g. social distancing).  This format is felt to be most appropriate for this patient at this time.  All issues noted in this document were discussed and addressed.  No physical exam was performed (except for noted visual exam findings with Video Visits).   I connected with Amy Morton today at  4:00 PM EST by telephone and verified that I am speaking with the correct person using two identifiers. Location patient: home Location provider: home office Persons participating in the virtual visit: patient, provider, Marsa Aris (sister)  I discussed the limitations, risks, security and privacy concerns of performing an evaluation and management service by telephone and the availability of in person appointments. I also discussed with the patient that there may be a patient responsible charge related to this service. The patient expressed understanding and agreed to proceed.  Interactive audio and video telecommunications were attempted between this provider and patient, however failed, due to patient having technical difficulties OR patient did not have access to video capability.  We continued and completed visit with audio only.   Reason for visit: f/u  HPI: Hypertension: The patient was recently hospitalized for hypertensive urgency.  They attempted to titrate her carvedilol dose up though she had relative hypotension and had a rapid response event.  They ended up discharging her on her home doses of Imdur and carvedilol.  They report that her blood pressure has been around 106 systolically at home since discharge.  No chest pain, shortness of breath, edema, or syncopal episodes.  Anxiety/dementia/sleep: The geriatric service did a full assessment of the patient while she was hospitalized.  They discussed adding on trazodone and the potential for  adding back mirtazapine.  They discussed tapering her off of Ativan and having her discontinue buspirone.  They also added on melatonin.  We added back mirtazapine after discharge.  The patient's sister notes she has been doing quite well with the combination of mirtazapine and trazodone.  She recently increased her evening dose of trazodone to 50 mg.  She is taking 25 mg of trazodone during the day.  She is on mirtazapine 7.5 mg daily.  She seems to be sleeping better.  She is not having as many anxiety attacks.  She does not have any excessive drowsiness with this combination.  They do report she has hearing aids though does not wear them.  The geriatric service recommended audiology evaluation.  Home health also came out and evaluated the patient at home and determined no services were needed.   ROS: See pertinent positives and negatives per HPI.  Past Medical History:  Diagnosis Date   (HFpEF) heart failure with preserved ejection fraction ([place])    a. 08/2020 PET: Not suggestive of transthyretin amyloidosis; b. 11/2020 Echo: 60-65%, gr1 DD.   Abnormal mammogram 2007   Recommend close follow up   Anemia    Iron, b12, SPEP, UPEP normal, 12/2012   Anxiety    Aortic insufficiency    a. 06/2020 cMRI: mild AI.   Arthritis    Asthma    CAD (coronary artery disease)    a. 1996 s/p PTCA; b. 06/2011 Cath: LCX 50, RCA 100 CTO; c. 03/2015 PCI: LAD 70, LCX 50m w/ abnl FFR (3.0x12 Synergy & 2.5x8 Synergy DESs); d. 01/2018 Cath: stable anatomy, patent LCX stents; e. 04/2020 Cath: LM nl, LAD 40p, 30m, D2 70ost, LCX 40ost, patent  stents, RCA 70p/m, 100d. RPAV fills via R->R collats. RPL2 fills via L->R collats-->Med Rx.   Carotid arterial disease ([place])    a. 05/2020 Carotid U/S: <50% bilat ICA stenoses.   Chronic cough    Depression    Diverticulosis    Dyspnea    Elevated LFTs 2010   s/p ultrasound w/ possible fatty liver; GI consult. Resolution 2011   Gastric ulcer    GERD (gastroesophageal reflux  disease)    History of colonic polyps    Dr. Vira Agar   [facility] (hard of hearing)    Hyperlipidemia    Hypertension    Peritoneal carcinomatosis [facility]) 2001   Dr. Oliva Bustard & Dr. Claiborne Rigg s/p chemo   PSVT (paroxysmal supraventricular tachycardia) ([place])    a. 06/2020 Zio: 52 episodes of SVT (longest 59 secs; fastest 159 bpm).   Severe aortic stenosis    a. 05/2020 Cor CTA: Tricuspid AoV w/ sev reduced cusp separation and Ca2+; b. 06/2020 cMRI: Sev AS w/ mild AI. Mod MR. EF 69% w/ sev asymmetric LVH. Nl RV fxn; c. 11/2020 Echo: EF 60-65%, no rwma, Gr1 DD. Nl RV size/fxn. Mild MR. Mod-Sev AS (appears severe visually) AVA 0.94cm^2.   Shingles 2008   T-9 distribution   Syncope    a. 06/2020 Zio: 7 beats WCT (NSVT). 52 episodes of SVT (longest 59 secs, fastest 159 bpm).   Vitamin D deficiency    Wheezing     Past Surgical History:  Procedure Laterality Date   ABDOMINAL HYSTERECTOMY  2001   APPENDECTOMY     CARDIAC CATHETERIZATION  2007   50% mid LAD stenosis, 70% proximal RCA with 100% distal RCA stenosis with collaterals, EF 65%   CARDIAC CATHETERIZATION N/A 04/27/2015   Procedure: Left Heart Cath and Coronary Angiography;  Surgeon: Jettie Booze, MD;  Location: [facility];  Service: Cardiovascular;  Laterality: N/A;   CARDIAC CATHETERIZATION N/A 04/27/2015   Procedure: Coronary Stent Intervention;  Surgeon: Jettie Booze, MD;  Location: [facility];  Service: Cardiovascular;  Laterality: N/A;   CATARACT EXTRACTION W/PHACO Left 09/25/2016   Procedure: CATARACT EXTRACTION PHACO AND INTRAOCULAR LENS PLACEMENT (IOC);  Surgeon: Eulogio Bear, MD;  Location: [facility];  Service: Ophthalmology;  Laterality: Left;  [place] 46.7 AP% 8.1 CDE 3.80 Fluid pack lot # [id number]   CATARACT EXTRACTION W/PHACO Right 11/06/2016   Procedure: CATARACT EXTRACTION PHACO AND INTRAOCULAR LENS PLACEMENT (IOC);  Surgeon: Eulogio Bear, MD;  Location: [facility];  Service: Ophthalmology;   Laterality: Right;  [place] 49.5 AP8.7 CDE4.28 LOT # [id number]   CHOLECYSTECTOMY  1987   COLECTOMY  2001   CORONARY ANGIOPLASTY  1996   CORONARY ANGIOPLASTY     LAPAROSCOPIC BILATERAL SALPINGO OOPHERECTOMY  1988   Dr. Moxee CATH AND CORONARY ANGIOGRAPHY N/A 01/25/2018   Procedure: LEFT HEART CATH AND CORONARY ANGIOGRAPHY;  Surgeon: Wellington Hampshire, MD;  Location: [place];  Service: Cardiovascular;  Laterality: N/A;   RIGHT/LEFT HEART CATH AND CORONARY ANGIOGRAPHY N/A 05/16/2020   Procedure: RIGHT/LEFT HEART CATH AND CORONARY ANGIOGRAPHY;  Surgeon: Wellington Hampshire, MD;  Location: [place] [facility];  Service: Cardiovascular;  Laterality: N/A;   TONSILLECTOMY  1950    Family History  Problem Relation Age of Onset   Hypertension Mother    Stroke Mother 22       Cerebral Hemorrhage   Cancer Father        bone cancer  Diabetes Sister    Diabetes Brother    Heart disease Brother    Cancer Daughter 29       Ovarian cancer   Colon cancer Neg Hx    Colon polyps Neg Hx     SOCIAL HX: Non-smoker   Current Outpatient Medications:    acetaminophen (TYLENOL) 500 MG tablet, Take 1,000 mg by mouth in the morning and at bedtime., Disp: , Rfl:    alendronate (FOSAMAX) 70 MG tablet, Take 1 tablet (70 mg total) by mouth every 7 (seven) days. Take with a full glass of water on an empty stomach., Disp: 4 tablet, Rfl: 11   Ascorbic Acid (VITAMIN C PO), Take by mouth., Disp: , Rfl:    aspirin 81 MG tablet, Take 81 mg by mouth daily., Disp: , Rfl:    atorvastatin (LIPITOR) 40 MG tablet, Take 1 tablet (40 mg total) by mouth daily., Disp: 90 tablet, Rfl: 3   Calcium Carbonate-Vit D-Min (CALCIUM 1200 PO), Take 1,200 mg by mouth daily., Disp: , Rfl:    carvedilol (COREG) 6.25 MG tablet, Take 1 tablet (6.25 mg total) by mouth 2 (two) times daily with a meal., Disp: 60 tablet, Rfl: 3   Cholecalciferol 5000 units TABS, Take 5,000 Units by mouth daily., Disp: , Rfl:    colestipol  (COLESTID) 1 g tablet, TAKE 1 TABLET(1 GRAM) BY MOUTH DAILY, Disp: 90 tablet, Rfl: 1   MAGNESIUM-OXIDE 400 (240 Mg) MG tablet, Take by mouth in the morning, at noon, and at bedtime., Disp: , Rfl:    melatonin 3 MG TABS tablet, Take by mouth., Disp: , Rfl:    mirtazapine (REMERON) 15 MG tablet, Take 0.5 tablets (7.5 mg total) by mouth at bedtime., Disp: 45 tablet, Rfl: 1   Multiple Vitamin (MULTIVITAMIN) capsule, Take 1 capsule by mouth daily., Disp: , Rfl:    nitroGLYCERIN (NITROSTAT) 0.4 MG SL tablet, PLACE 1 TABLET UNDER THE TONGUE EVERY 5 MINUTES AS NEEDED FOR CHEST PAIN, 3 DOSES MAX, Disp: 25 tablet, Rfl: 1   nitroGLYCERIN (NITROSTAT) 0.4 MG SL tablet, Place under the tongue., Disp: , Rfl:    Nutritional Supplements (OSTEO ADVANCE) TABS, Take 1 tablet by mouth daily. , Disp: , Rfl:    pantoprazole (PROTONIX) 40 MG tablet, TAKE 1 TABLET(40 MG) BY MOUTH DAILY, Disp: 90 tablet, Rfl: 1   potassium chloride SA (KLOR-CON) 20 MEQ tablet, Take 1 tablet (20 mEq total) by mouth daily., Disp: 90 tablet, Rfl: 3   traZODone (DESYREL) 50 MG tablet, Take by mouth., Disp: , Rfl:    Ubiquinol 100 MG CAPS, Take 100 mg by mouth every morning., Disp: , Rfl:    isosorbide mononitrate (IMDUR) 30 MG 24 hr tablet, Take 2 tablets (60 mg total) by mouth daily. (Patient taking differently: Take 30 mg by mouth daily.), Disp: 60 tablet, Rfl: 0 No current facility-administered medications for this visit.  Facility-Administered Medications Ordered in Other Visits:    sodium chloride 0.9 % nebulizer solution 3 mL, 3 mL, Nebulization, Once **FOLLOWED BY** methacholine (PROVOCHOLINE) inhaler solution 0.125 mg, 2 mL, Inhalation, Once **FOLLOWED BY** methacholine (PROVOCHOLINE) inhaler solution 0.5 mg, 2 mL, Inhalation, Once **FOLLOWED BY** methacholine (PROVOCHOLINE) inhaler solution 2 mg, 2 mL, Inhalation, Once **FOLLOWED BY** methacholine (PROVOCHOLINE) inhaler solution 8 mg, 2 mL, Inhalation, Once **FOLLOWED BY** methacholine  (PROVOCHOLINE) inhaler solution 32 mg, 2 mL, Inhalation, Once **FOLLOWED BY** [COMPLETED] albuterol (PROVENTIL) (2.5 MG/3ML) 0.083% nebulizer solution 2.5 mg, 2.5 mg, Nebulization, Once, [name], MD, 2.5 mg at  08/27/17 0908  EXAM: This was a telephone visit and thus no exam was completed.  ASSESSMENT AND PLAN:  Discussed the following assessment and plan:  Problem List Items Addressed This Visit     Anxiety and depression (Chronic)    Chronic issues that seem to be somewhat improved with the reintroduction of mirtazapine and tapering up on trazodone.  She will continue mirtazapine 7.5 mg once weekly and trazodone 25 mg during the day and 50 mg at night.  I advised that we can always titrate the mirtazapine dose up though I was hesitant to make too many changes at once given that they just increased the trazodone dose.  If she has persistent or worsening issues with this they can let me know and we can go up on the mirtazapine dose.      Essential hypertension (Chronic)    Blood pressure is relatively adequately controlled for her age and given the fact that she has severe aortic stenosis.  She had relative hypotension with titrating her carvedilol up and thus we really are unable to alter her medication regimen at this time.  She will continue carvedilol 6.25 mg twice daily and Imdur 30 mg daily.  She will follow-up with cardiology in the new year.      Hyponatremia (Chronic)    This was generally stable in the low 130s while in the hospital.  This has chronically been in that range.  We had previously discussed sodium tablets though given stability and lack of symptoms I would argue that we continue to monitor at this time.  They will let us know if she develops any symptoms and we will periodically check her sodium levels.      Memory difficulty    I discussed the reasoning behind the recommendation for the audiology referral.  Advised that the patient try to use her hearing aids  given that she already has them.  Discussed that poor hearing can lead to worsening memory and dementia issues and that it may help to wear her hearing aids.  Given that she already has hearing aids I am not sure seeing an audiologist would be of much use.       Return for As scheduled.   I discussed the assessment and treatment plan with the patient. The patient was provided an opportunity to ask questions and all were answered. The patient agreed with the plan and demonstrated an understanding of the instructions.   The patient was advised to call back or seek an in-person evaluation if the symptoms worsen or if the condition fails to improve as anticipated.  I provided 11 minutes of non-face-to-face time during this encounter.   [name], MD

## 2021-06-27 NOTE — Assessment & Plan Note (Signed)
Blood pressure is relatively adequately controlled for her age and given the fact that she has severe aortic stenosis.  She had relative hypotension with titrating her carvedilol up and thus we really are unable to alter her medication regimen at this time.  She will continue carvedilol 6.25 mg twice daily and Imdur 30 mg daily.  She will follow-up with cardiology in the new year.

## 2021-06-27 NOTE — Assessment & Plan Note (Addendum)
I discussed the reasoning behind the recommendation for the audiology referral.  Advised that the patient try to use her hearing aids given that she already has them.  Discussed that poor hearing can lead to worsening memory and dementia issues and that it may help to wear her hearing aids.  Given that she already has hearing aids I am not sure seeing an audiologist would be of much use.

## 2021-06-27 NOTE — Assessment & Plan Note (Signed)
This was generally stable in the low 130s while in the hospital.  This has chronically been in that range.  We had previously discussed sodium tablets though given stability and lack of symptoms I would argue that we continue to monitor at this time.  They will let us know if she develops any symptoms and we will periodically check her sodium levels.

## 2021-07-01 ENCOUNTER — Other Ambulatory Visit: Payer: Self-pay | Admitting: Family Medicine

## 2021-07-23 ENCOUNTER — Other Ambulatory Visit: Payer: Self-pay | Admitting: Cardiovascular Disease

## 2021-07-23 ENCOUNTER — Other Ambulatory Visit: Payer: Self-pay | Admitting: Family

## 2021-07-23 DIAGNOSIS — E782 Mixed hyperlipidemia: Secondary | ICD-10-CM

## 2021-07-23 DIAGNOSIS — I10 Essential (primary) hypertension: Secondary | ICD-10-CM

## 2021-07-23 NOTE — Telephone Encounter (Signed)
Please advise on directions for refill. Dr. Tyrell Antonio 05/21/21 AVS states patient is to take 2 tablets of isosorbide 30mg  daily. 06/04/21 Hospital discharge states to take 1 tablet PO daily. Thank you!

## 2021-08-03 DIAGNOSIS — Z20822 Contact with and (suspected) exposure to covid-19: Secondary | ICD-10-CM | POA: Diagnosis not present

## 2021-08-05 ENCOUNTER — Other Ambulatory Visit: Payer: Self-pay | Admitting: Family Medicine

## 2021-08-14 ENCOUNTER — Encounter: Payer: Self-pay | Admitting: Family Medicine

## 2021-08-16 ENCOUNTER — Other Ambulatory Visit: Payer: Self-pay

## 2021-08-16 MED ORDER — TRAZODONE HCL 50 MG PO TABS: ORAL_TABLET | ORAL | 3 refills | Status: DC

## 2021-09-03 ENCOUNTER — Ambulatory Visit (INDEPENDENT_AMBULATORY_CARE_PROVIDER_SITE_OTHER): Payer: Medicare Other | Admitting: Family Medicine

## 2021-09-03 ENCOUNTER — Other Ambulatory Visit: Payer: Self-pay

## 2021-09-03 DIAGNOSIS — F32A Depression, unspecified: Secondary | ICD-10-CM

## 2021-09-03 DIAGNOSIS — I10 Essential (primary) hypertension: Secondary | ICD-10-CM | POA: Diagnosis not present

## 2021-09-03 DIAGNOSIS — R600 Localized edema: Secondary | ICD-10-CM | POA: Diagnosis not present

## 2021-09-03 DIAGNOSIS — E785 Hyperlipidemia, unspecified: Secondary | ICD-10-CM | POA: Diagnosis not present

## 2021-09-03 DIAGNOSIS — F419 Anxiety disorder, unspecified: Secondary | ICD-10-CM

## 2021-09-03 DIAGNOSIS — F03B0 Unspecified dementia, moderate, without behavioral disturbance, psychotic disturbance, mood disturbance, and anxiety: Secondary | ICD-10-CM | POA: Diagnosis not present

## 2021-09-03 LAB — LIPID PANEL
Cholesterol: 116 mg/dL (ref 0–200)
HDL: 50.8 mg/dL (ref >39.00)
LDL Cholesterol: 33 mg/dL (ref 0–99)
NonHDL: 65.13
Total CHOL/HDL Ratio: 2
Triglycerides: 159 mg/dL — ABNORMAL HIGH (ref 0.0–149.0)
VLDL: 31.8 mg/dL (ref 0.0–40.0)

## 2021-09-03 LAB — COMPREHENSIVE METABOLIC PANEL
ALT: 14 U/L (ref 0–35)
AST: 22 U/L (ref 0–37)
Albumin: 4.4 g/dL (ref 3.5–5.2)
Alkaline Phosphatase: 58 U/L (ref 39–117)
BUN: 11 mg/dL (ref 6–23)
CO2: 29 mEq/L (ref 19–32)
Calcium: 9.8 mg/dL (ref 8.4–10.5)
Chloride: 97 mEq/L (ref 96–112)
Creatinine, Ser: 0.72 mg/dL (ref 0.40–1.20)
GFR: 76.8 mL/min (ref >60.00)
Glucose, Bld: 105 mg/dL — ABNORMAL HIGH (ref 70–99)
Potassium: 4.4 mEq/L (ref 3.5–5.1)
Sodium: 133 mEq/L — ABNORMAL LOW (ref 135–145)
Total Bilirubin: 0.7 mg/dL (ref 0.2–1.2)
Total Protein: 7 g/dL (ref 6.0–8.3)

## 2021-09-03 LAB — CBC
HCT: 35.8 % — ABNORMAL LOW (ref 36.0–46.0)
Hemoglobin: 12 g/dL (ref 12.0–15.0)
MCHC: 33.4 g/dL (ref 30.0–36.0)
MCV: 93.3 fl (ref 78.0–100.0)
Platelets: 158 10*3/uL (ref 150.0–400.0)
RBC: 3.84 Mil/uL — ABNORMAL LOW (ref 3.87–5.11)
RDW: 12.9 % (ref 11.5–15.5)
WBC: 7.5 10*3/uL (ref 4.0–10.5)

## 2021-09-03 LAB — TSH: TSH: 0.9 u[IU]/mL (ref 0.35–5.50)

## 2021-09-03 MED ORDER — TRAZODONE HCL 50 MG PO TABS: ORAL_TABLET | ORAL | 3 refills | Status: DC

## 2021-09-03 NOTE — Patient Instructions (Signed)
Nice to see you. We will get lab work today and contact you with the results. You can take trazodone 50 mg in the afternoon and 50 mg at bedtime.  If you get drowsy with this please let us know.

## 2021-09-03 NOTE — Assessment & Plan Note (Signed)
-  Continue Lipitor 40 mg daily ?

## 2021-09-03 NOTE — Assessment & Plan Note (Signed)
Adequate for her age and aortic stenosis status.  She will continue carvedilol 6.25 mg twice daily and Imdur 30 mg daily.

## 2021-09-03 NOTE — Assessment & Plan Note (Signed)
Generally stable.  We will continue treatment of sundowning with trazodone as that has been beneficial.  We will increase her afternoon trazodone dose to 50 mg and she will continue 50 mg of trazodone at night.  If they notice excessive drowsiness they will let us know.

## 2021-09-03 NOTE — Progress Notes (Signed)
[name], MD Phone: [phone]  Amy Morton is a 87 y.o. female who presents today for f/u.  HYPERTENSION Disease Monitoring Home BP Monitoring not checking Chest pain- no    Dyspnea- no Medications Compliance-  taking coreg, imdur. Lightheadedness-  no  Edema- mild ankles at the end of the day No orthopnea or PND. BMET    Component Value Date/Time   NA 129 (L) 06/02/2021 1307   NA 128 (L) 12/12/2020 1518   NA 135 11/17/2014 2031   K 3.9 06/02/2021 1307   K 3.5 11/17/2014 2031   CL 94 (L) 06/02/2021 1307   CL 101 11/17/2014 2031   CO2 22 06/02/2021 1307   CO2 23 11/17/2014 2031   GLUCOSE 133 (H) 06/02/2021 1307   GLUCOSE 119 (H) 11/17/2014 2031   BUN 12 06/02/2021 1307   BUN 12 12/12/2020 1518   BUN 15 11/17/2014 2031   CREATININE 0.57 06/02/2021 1307   CREATININE 0.73 04/28/2016 1003   CALCIUM 9.5 06/02/2021 1307   CALCIUM 9.2 11/17/2014 2031   GFRNONAA >60 06/02/2021 1307   GFRNONAA >60 11/17/2014 2031   GFRAA 84 07/18/2020 1205   GFRAA >60 11/17/2014 2031   HYPERLIPIDEMIA Symptoms Chest pain on exertion:  no   Medications: Compliance- taking lipitor Right upper quadrant pain- no  Muscle aches- no Lipid Panel     Component Value Date/Time   CHOL 95 06/06/2020 0552   CHOL 115 09/03/2017 1049   TRIG 68 06/06/2020 0552   HDL 41 06/06/2020 0552   HDL 50 09/03/2017 1049   CHOLHDL 2.3 06/06/2020 0552   VLDL 14 06/06/2020 0552   LDLCALC 40 06/06/2020 0552   LDLCALC 29 09/03/2017 1049   LDLDIRECT 35.0 02/21/2019 1012   LABVLDL 36 09/03/2017 1049   Dementia: Generally stable.  They have noticed that she has remembered a few things that require short-term memory.  She continues to have crying episodes though her sister reports they seem to be improved compared to the past.  She has been taking 25 mg of trazodone in the afternoon for sundowning and 50 mg at night.  They wonder if we can increase this to see if that provides more benefit.  She  continues on Remeron 7.5 mg daily.  She does note some depression mostly related to being alone at home.  Social History   Tobacco Use  Smoking Status Never  Smokeless Tobacco Never    Current Outpatient Medications on File Prior to Visit  Medication Sig Dispense Refill   acetaminophen (TYLENOL) 500 MG tablet Take 1,000 mg by mouth in the morning and at bedtime.     aspirin 81 MG tablet Take 81 mg by mouth daily.     atorvastatin (LIPITOR) 40 MG tablet TAKE 1 TABLET BY MOUTH EVERY DAY 90 tablet 3   Calcium Carbonate-Vit D-Min (CALCIUM 1200 PO) Take 1,200 mg by mouth daily.     carvedilol (COREG) 6.25 MG tablet TAKE 1 TABLET(6.25 MG) BY MOUTH TWICE DAILY WITH A MEAL 60 tablet 5   Cholecalciferol 5000 units TABS Take 5,000 Units by mouth daily.     colestipol (COLESTID) 1 g tablet TAKE 1 TABLET(1 GRAM) BY MOUTH DAILY 90 tablet 1   isosorbide mononitrate (IMDUR) 30 MG 24 hr tablet Take 1 tablet (30 mg total) by mouth daily. 90 tablet 1   MAGNESIUM-OXIDE 400 (240 Mg) MG tablet Take by mouth in the morning, at noon, and at bedtime.     melatonin 3 MG TABS tablet  Take by mouth.     mirtazapine (REMERON) 15 MG tablet Take 0.5 tablets (7.5 mg total) by mouth at bedtime. 45 tablet 1   Multiple Vitamin (MULTIVITAMIN) capsule Take 1 capsule by mouth daily.     nitroGLYCERIN (NITROSTAT) 0.4 MG SL tablet PLACE 1 TABLET UNDER THE TONGUE EVERY 5 MINUTES AS NEEDED FOR CHEST PAIN, 3 DOSES MAX 25 tablet 1   nitroGLYCERIN (NITROSTAT) 0.4 MG SL tablet Place under the tongue.     Nutritional Supplements (OSTEO ADVANCE) TABS Take 1 tablet by mouth daily.      pantoprazole (PROTONIX) 40 MG tablet TAKE 1 TABLET(40 MG) BY MOUTH DAILY 90 tablet 1   potassium chloride SA (KLOR-CON) 20 MEQ tablet Take 1 tablet (20 mEq total) by mouth daily. 90 tablet 3   Ubiquinol 100 MG CAPS Take 100 mg by mouth every morning.     Current Facility-Administered Medications on File Prior to Visit  Medication Dose Route Frequency  Provider Last Rate Last Admin   sodium chloride 0.9 % nebulizer solution 3 mL  3 mL Nebulization Once [name], MD       Followed by   methacholine (PROVOCHOLINE) inhaler solution 0.125 mg  2 mL Inhalation Once [name], MD       Followed by   methacholine (PROVOCHOLINE) inhaler solution 0.5 mg  2 mL Inhalation Once [name], MD       Followed by   methacholine (PROVOCHOLINE) inhaler solution 2 mg  2 mL Inhalation Once [name], MD       Followed by   methacholine (PROVOCHOLINE) inhaler solution 8 mg  2 mL Inhalation Once [name], MD       Followed by   methacholine (PROVOCHOLINE) inhaler solution 32 mg  2 mL Inhalation Once [name], MD         ROS see history of present illness  Objective  Physical Exam Vitals:   09/03/21 1210  BP: (!) 160/80  Pulse: 90  Temp: 98.3 F (36.8 C)  SpO2: 96%    BP Readings from Last 3 Encounters:  09/03/21 (!) 160/80  06/10/21 (!) 167/81  05/21/21 140/60   Wt Readings from Last 3 Encounters:  09/03/21 112 lb 3.2 oz (50.9 kg)  06/27/21 116 lb (52.6 kg)  06/10/21 108 lb (49 kg)    Physical Exam Constitutional:      General: She is not in acute distress.    Appearance: She is not diaphoretic.  Cardiovascular:     Rate and Rhythm: Normal rate and regular rhythm.     Heart sounds: Murmur (3/6 systolic murmur right upper sternal border) heard.  Pulmonary:     Effort: Pulmonary effort is normal.     Breath sounds: Normal breath sounds.  Musculoskeletal:     Right lower leg: No edema.     Left lower leg: No edema.  Skin:    General: Skin is warm and dry.  Neurological:     Mental Status: She is alert.     Assessment/Plan: Please see individual problem list.  Problem List Items Addressed This Visit     Anxiety and depression (Chronic)    We will see if this improves with increasing the dose of her trazodone.  She will continue Remeron 7.5 mg daily.      Relevant  Medications   traZODone (DESYREL) 50 MG tablet   Essential hypertension (Chronic)    Adequate for her age and aortic stenosis status.  She will continue carvedilol 6.25 mg twice daily and Imdur 30 mg daily.      Relevant Orders   Comp Met (CMET)   TSH   CBC   Hyperlipidemia (Chronic)    Continue Lipitor 40 mg daily.      Relevant Orders   Comp Met (CMET)   Lipid panel   Dementia (HCC)    Generally stable.  We will continue treatment of sundowning with trazodone as that has been beneficial.  We will increase her afternoon trazodone dose to 50 mg and she will continue 50 mg of trazodone at night.  If they notice excessive drowsiness they will let us know.      Relevant Medications   traZODone (DESYREL) 50 MG tablet   Lower extremity edema    Likely related to venous insufficiency.  She does not endorse any CHF symptoms.  We will check lab work as outlined.  Discussed elevating her legs.      Relevant Orders   TSH   CBC     Return in about 3 months (around 12/01/2021) for Dementia.  This visit occurred during the SARS-CoV-2 public health emergency.  Safety protocols were in place, including screening questions prior to the visit, additional usage of staff PPE, and extensive cleaning of exam room while observing appropriate contact time as indicated for disinfecting solutions.    [name], MD [place]

## 2021-09-03 NOTE — Assessment & Plan Note (Signed)
We will see if this improves with increasing the dose of her trazodone.  She will continue Remeron 7.5 mg daily.

## 2021-09-03 NOTE — Assessment & Plan Note (Signed)
Likely related to venous insufficiency.  She does not endorse any CHF symptoms.  We will check lab work as outlined.  Discussed elevating her legs.

## 2021-10-16 DIAGNOSIS — Z20822 Contact with and (suspected) exposure to covid-19: Secondary | ICD-10-CM | POA: Diagnosis not present

## 2021-10-20 ENCOUNTER — Other Ambulatory Visit: Payer: Self-pay | Admitting: Family Medicine

## 2021-11-07 DIAGNOSIS — H26491 Other secondary cataract, right eye: Secondary | ICD-10-CM | POA: Diagnosis not present

## 2021-11-14 DIAGNOSIS — Z20822 Contact with and (suspected) exposure to covid-19: Secondary | ICD-10-CM | POA: Diagnosis not present

## 2021-11-19 ENCOUNTER — Other Ambulatory Visit: Payer: Self-pay | Admitting: Cardiovascular Disease

## 2021-11-19 NOTE — Telephone Encounter (Signed)
Would you please refill this if Dr. Fletcher Anon wants her to continue taking it? She has an appointment this Thursday. Thank you!! ?

## 2021-11-20 DIAGNOSIS — H26491 Other secondary cataract, right eye: Secondary | ICD-10-CM | POA: Diagnosis not present

## 2021-11-21 ENCOUNTER — Encounter: Payer: Self-pay | Admitting: Cardiovascular Disease

## 2021-11-21 ENCOUNTER — Ambulatory Visit (INDEPENDENT_AMBULATORY_CARE_PROVIDER_SITE_OTHER): Payer: Medicare Other | Admitting: Cardiovascular Disease

## 2021-11-21 VITALS — BP 140/60 | HR 76 | Ht 59.0 in | Wt 113.1 lb

## 2021-11-21 DIAGNOSIS — I350 Nonrheumatic aortic (valve) stenosis: Secondary | ICD-10-CM | POA: Diagnosis not present

## 2021-11-21 DIAGNOSIS — I2510 Atherosclerotic heart disease of native coronary artery without angina pectoris: Secondary | ICD-10-CM | POA: Diagnosis not present

## 2021-11-21 DIAGNOSIS — I10 Essential (primary) hypertension: Secondary | ICD-10-CM

## 2021-11-21 DIAGNOSIS — E785 Hyperlipidemia, unspecified: Secondary | ICD-10-CM | POA: Diagnosis not present

## 2021-11-21 NOTE — Patient Instructions (Signed)

## 2021-11-21 NOTE — Progress Notes (Signed)
?  ?Cardiology Office Note ? ? ?Date:  11/21/2021  ? ?ID:  Amy Morton, DOB Aug 13, 1934, MRN [id number] ? ?PCP:  [name], MD  ?Cardiologist:   Kathlyn Sacramento, MD  ? ?Chief Complaint  ?Patient presents with  ? Other  ?  6 month f/u no complaints today. Meds reviewed verbally with pt.  ? ? ?  ?History of Present Illness: ?Amy Morton is a 86 y.o. female who presents for for a follow-up visit regarding coronary artery disease and moderate to severe aortic stenosis. ?Other medical problems include anemia, hypertension, hyperlipidemia, anxiety and depression. ?She had previous left circumflex PCI in 2016 with known occluded distal right coronary artery. ? ?She had worsening anginal symptoms in 2021.  Right and left cardiac catheterization was performed in October 2021 which showed low filling pressures, normal pulmonary pressure, normal cardiac output and severe aortic stenosis with mean gradient of 30 mmHg and valve area of 0.82 square.  Coronary angiography showed patent stents in the left circumflex without significant restenosis, stable 70% stenosis in the mid LAD in a tortuous segment and known chronically occluded right coronary artery with left-to-right collaterals. ? ?She was hospitalized in November, 2021 with TIA.  The patient was seen by our TAVR team.  She underwent PYP scan which was not consistent with TTR amyloid.  It was felt that her aortic stenosis was moderate and there was concern about the patient's memory and continued functional decline. ? ?She has dementia with gradual worsening of memory and functional capacity.  In addition, she had gradual weight loss since 2019 and she is down to 113 pounds.  She denies any chest pain or worsening dyspnea but she is a poor historian. ? ? ?Past Medical History:  ?Diagnosis Date  ? (HFpEF) heart failure with preserved ejection fraction ([place])   ? a. 08/2020 PET: Not suggestive of transthyretin amyloidosis; b. 11/2020 Echo: 60-65%, gr1 DD.  ?  Abnormal mammogram 2007  ? Recommend close follow up  ? Anemia   ? Iron, b12, SPEP, UPEP normal, 12/2012  ? Anxiety   ? Aortic insufficiency   ? a. 06/2020 cMRI: mild AI.  ? Arthritis   ? Asthma   ? CAD (coronary artery disease)   ? a. 1996 s/p PTCA; b. 06/2011 Cath: LCX 50, RCA 100 CTO; c. 03/2015 PCI: LAD 70, LCX 61mw/ abnl FFR (3.0x12 Synergy & 2.5x8 Synergy DESs); d. 01/2018 Cath: stable anatomy, patent LCX stents; e. 04/2020 Cath: LM nl, LAD 40p, 611mD2 70ost, LCX 40ost, patent stents, RCA 70p/m, 100d. RPAV fills via R->R collats. RPL2 fills via L->R collats-->Med Rx.  ? Carotid arterial disease ([facility]  ? a. 05/2020 Carotid U/S: <50% bilat ICA stenoses.  ? Chronic cough   ? Depression   ? Diverticulosis   ? Dyspnea   ? Elevated LFTs 2010  ? s/p ultrasound w/ possible fatty liver; GI consult. Resolution 2011  ? Gastric ulcer   ? GERD (gastroesophageal reflux disease)   ? History of colonic polyps   ? Dr. ElVira Agar? HOH (hard of hearing)   ? Hyperlipidemia   ? Hypertension   ? Peritoneal carcinomatosis ([facility]  ? Dr. ChOliva Bustard Dr. BrClaiborne Rigg/p chemo  ? PSVT (paroxysmal supraventricular tachycardia) ([facility]  ? a. 06/2020 Zio: 52 episodes of SVT (longest 59 secs; fastest 159 bpm).  ? Severe aortic stenosis   ? a. 05/2020 Cor CTA: Tricuspid AoV w/ sev reduced cusp separation and Ca2+;  b. 06/2020 cMRI: Sev AS w/ mild AI. Mod MR. EF 69% w/ sev asymmetric LVH. Nl RV fxn; c. 11/2020 Echo: EF 60-65%, no rwma, Gr1 DD. Nl RV size/fxn. Mild MR. Mod-Sev AS (appears severe visually) AVA 0.94cm^2.  ? Shingles 2008  ? T-9 distribution  ? Syncope   ? a. 06/2020 Zio: 7 beats WCT (NSVT). 52 episodes of SVT (longest 59 secs, fastest 159 bpm).  ? Vitamin D deficiency   ? Wheezing   ? ? ?Past Surgical History:  ?Procedure Laterality Date  ? ABDOMINAL HYSTERECTOMY  2001  ? APPENDECTOMY    ? CARDIAC CATHETERIZATION  2007  ? 50% mid LAD stenosis, 70% proximal RCA with 100% distal RCA stenosis with collaterals, EF 65%  ? CARDIAC  CATHETERIZATION N/A 04/27/2015  ? Procedure: Left Heart Cath and Coronary Angiography;  Surgeon: Jettie Booze, MD;  Location: [place] [facility];  Service: Cardiovascular;  Laterality: N/A;  ? CARDIAC CATHETERIZATION N/A 04/27/2015  ? Procedure: Coronary Stent Intervention;  Surgeon: Jettie Booze, MD;  Location: [place] [facility];  Service: Cardiovascular;  Laterality: N/A;  ? CATARACT EXTRACTION W/PHACO Left 09/25/2016  ? Procedure: CATARACT EXTRACTION PHACO AND INTRAOCULAR LENS PLACEMENT (IOC);  Surgeon: Eulogio Bear, MD;  Location: [facility];  Service: Ophthalmology;  Laterality: Left;  [place] 46.7 ?AP% 8.1 ?CDE 3.80 ?Fluid pack lot # [id number]  ? CATARACT EXTRACTION W/PHACO Right 11/06/2016  ? Procedure: CATARACT EXTRACTION PHACO AND INTRAOCULAR LENS PLACEMENT (IOC);  Surgeon: Eulogio Bear, MD;  Location: [facility];  Service: Ophthalmology;  Laterality: Right;  [place] 49.5 ?AP8.7 ?CDE4.28 ?LOT # [id number]  ? CHOLECYSTECTOMY  1987  ? COLECTOMY  2001  ? CORONARY ANGIOPLASTY  1996  ? CORONARY ANGIOPLASTY    ? [place] OOPHERECTOMY  1988  ? Dr. Laverta Baltimore  ? LEFT HEART CATH AND CORONARY ANGIOGRAPHY N/A 01/25/2018  ? Procedure: LEFT HEART CATH AND CORONARY ANGIOGRAPHY;  Surgeon: Wellington Hampshire, MD;  Location: [place] [facility];  Service: Cardiovascular;  Laterality: N/A;  ? RIGHT/LEFT HEART CATH AND CORONARY ANGIOGRAPHY N/A 05/16/2020  ? Procedure: RIGHT/LEFT HEART CATH AND CORONARY ANGIOGRAPHY;  Surgeon: Wellington Hampshire, MD;  Location: [place] [facility];  Service: Cardiovascular;  Laterality: N/A;  ? TONSILLECTOMY  1950  ? ? ? ?Current Outpatient Medications  ?Medication Sig Dispense Refill  ? acetaminophen (TYLENOL) 500 MG tablet Take 1,000 mg by mouth in the morning and at bedtime.    ? aspirin 81 MG tablet Take 81 mg by mouth daily.    ? atorvastatin (LIPITOR) 40 MG tablet TAKE 1 TABLET BY MOUTH EVERY DAY 90 tablet 3  ? Calcium Carbonate-Vit D-Min (CALCIUM 1200 PO) Take 1,200 mg by  mouth daily.    ? carvedilol (COREG) 6.25 MG tablet TAKE 1 TABLET(6.25 MG) BY MOUTH TWICE DAILY WITH A MEAL 60 tablet 5  ? Cholecalciferol 5000 units TABS Take 5,000 Units by mouth daily.    ? Coenzyme Q10 200 MG capsule Take 200 mg by mouth daily.    ? colestipol (COLESTID) 1 g tablet TAKE 1 TABLET(1 GRAM) BY MOUTH DAILY 90 tablet 1  ? isosorbide mononitrate (IMDUR) 30 MG 24 hr tablet Take 1 tablet (30 mg total) by mouth daily. 90 tablet 1  ? magnesium oxide (MAG-OX) 400 MG tablet Take 400 mg by mouth in the morning, at noon, and at bedtime.    ? MAGNESIUM-OXIDE 400 (240 Mg) MG tablet Take by mouth in the morning, at noon, and  at bedtime.    ? melatonin 3 MG TABS tablet Take by mouth.    ? mirtazapine (REMERON) 15 MG tablet Take 0.5 tablets (7.5 mg total) by mouth at bedtime. 45 tablet 1  ? Multiple Vitamin (MULTIVITAMIN) capsule Take 1 capsule by mouth daily.    ? nitroGLYCERIN (NITROSTAT) 0.4 MG SL tablet PLACE 1 TABLET UNDER THE TONGUE EVERY 5 MINUTES AS NEEDED FOR CHEST PAIN, 3 DOSES MAX 25 tablet 1  ? Nutritional Supplements (OSTEO ADVANCE) TABS Take 1 tablet by mouth daily.     ? pantoprazole (PROTONIX) 40 MG tablet TAKE 1 TABLET(40 MG) BY MOUTH DAILY 90 tablet 0  ? potassium chloride SA (KLOR-CON) 20 MEQ tablet Take 1 tablet (20 mEq total) by mouth daily. 90 tablet 3  ? traZODone (DESYREL) 50 MG tablet Take 1 tablet (50 mg total) by mouth daily in the afternoon AND 1 tablet (50 mg total) at bedtime. 180 tablet 3  ? Ubiquinol 100 MG CAPS Take 100 mg by mouth every morning.    ? ?No current facility-administered medications for this visit.  ? ?Facility-Administered Medications Ordered in Other Visits  ?Medication Dose Route Frequency Provider Last Rate Last Admin  ? sodium chloride 0.9 % nebulizer solution 3 mL  3 mL Nebulization Once [name], MD      ? Followed by  ? methacholine (PROVOCHOLINE) inhaler solution 0.125 mg  2 mL Inhalation Once [name], MD      ? Followed by  ? methacholine  (PROVOCHOLINE) inhaler solution 0.5 mg  2 mL Inhalation Once [name], MD      ? Followed by  ? methacholine (PROVOCHOLINE) inhaler solution 2 mg  2 mL Inhalation Once [name], MD

## 2021-11-23 DIAGNOSIS — Z20822 Contact with and (suspected) exposure to covid-19: Secondary | ICD-10-CM | POA: Diagnosis not present

## 2021-11-27 DIAGNOSIS — Z20822 Contact with and (suspected) exposure to covid-19: Secondary | ICD-10-CM | POA: Diagnosis not present

## 2021-11-29 ENCOUNTER — Other Ambulatory Visit: Payer: Self-pay | Admitting: Family Medicine

## 2021-12-02 ENCOUNTER — Encounter: Payer: Self-pay | Admitting: Family Medicine

## 2021-12-02 ENCOUNTER — Ambulatory Visit (INDEPENDENT_AMBULATORY_CARE_PROVIDER_SITE_OTHER): Payer: Medicare Other | Admitting: Family Medicine

## 2021-12-02 VITALS — BP 120/70 | HR 77 | Temp 97.8°F | Ht 59.0 in | Wt 112.6 lb

## 2021-12-02 DIAGNOSIS — I10 Essential (primary) hypertension: Secondary | ICD-10-CM | POA: Diagnosis not present

## 2021-12-02 DIAGNOSIS — I2510 Atherosclerotic heart disease of native coronary artery without angina pectoris: Secondary | ICD-10-CM

## 2021-12-02 DIAGNOSIS — F419 Anxiety disorder, unspecified: Secondary | ICD-10-CM

## 2021-12-02 DIAGNOSIS — E871 Hypo-osmolality and hyponatremia: Secondary | ICD-10-CM

## 2021-12-02 DIAGNOSIS — F03B0 Unspecified dementia, moderate, without behavioral disturbance, psychotic disturbance, mood disturbance, and anxiety: Secondary | ICD-10-CM | POA: Diagnosis not present

## 2021-12-02 DIAGNOSIS — R350 Frequency of micturition: Secondary | ICD-10-CM

## 2021-12-02 DIAGNOSIS — F32A Depression, unspecified: Secondary | ICD-10-CM | POA: Diagnosis not present

## 2021-12-02 LAB — POCT URINALYSIS DIPSTICK
Bilirubin, UA: NEGATIVE
Blood, UA: NEGATIVE
Glucose, UA: NEGATIVE
Nitrite, UA: POSITIVE
Protein, UA: POSITIVE — AB
Spec Grav, UA: 1.01 (ref 1.010–1.025)
Urobilinogen, UA: 2 E.U./dL — AB
pH, UA: 7 (ref 5.0–8.0)

## 2021-12-02 LAB — BASIC METABOLIC PANEL
BUN: 12 mg/dL (ref 6–23)
CO2: 27 mEq/L (ref 19–32)
Calcium: 9.2 mg/dL (ref 8.4–10.5)
Chloride: 91 mEq/L — ABNORMAL LOW (ref 96–112)
Creatinine, Ser: 0.67 mg/dL (ref 0.40–1.20)
GFR: 80.14 mL/min (ref >60.00)
Glucose, Bld: 99 mg/dL (ref 70–99)
Potassium: 4.5 mEq/L (ref 3.5–5.1)
Sodium: 126 mEq/L — ABNORMAL LOW (ref 135–145)

## 2021-12-02 MED ORDER — CEPHALEXIN 500 MG PO CAPS: 500.0000 mg | ORAL_CAPSULE | Freq: Four times a day (QID) | ORAL | 0 refills | Status: DC

## 2021-12-02 MED ORDER — MIRTAZAPINE 15 MG PO TABS: 15.0000 mg | ORAL_TABLET | Freq: Every day | ORAL | 1 refills | Status: DC

## 2021-12-02 NOTE — Assessment & Plan Note (Addendum)
Symptoms concerning for UTI.  Urinalysis is concerning for UTI.  We will send urine for culture and microscopy.  We will treat with Keflex 500 mg 4 times daily. ?

## 2021-12-02 NOTE — Assessment & Plan Note (Signed)
Well-controlled.  She will continue Imdur 30 mg once daily and carvedilol 6.25 mg twice daily. ?

## 2021-12-02 NOTE — Progress Notes (Signed)
?[name], MD ?Phone: [phone] ? ?Amy Morton is a 87 y.o. female who presents today for f/u. ? ?HYPERTENSION ?Disease Monitoring ?Home BP Monitoring notes it has not been high, though does not check frequently Chest pain- no    Dyspnea- no ?Medications ?Compliance-  taking imdur, coreg.   Edema- no ?BMET ?   ?Component Value Date/Time  ? NA 133 (L) 09/03/2021 1222  ? NA 128 (L) 12/12/2020 1518  ? NA 135 11/17/2014 2031  ? K 4.4 09/03/2021 1222  ? K 3.5 11/17/2014 2031  ? CL 97 09/03/2021 1222  ? CL 101 11/17/2014 2031  ? CO2 29 09/03/2021 1222  ? CO2 23 11/17/2014 2031  ? GLUCOSE 105 (H) 09/03/2021 1222  ? GLUCOSE 119 (H) 11/17/2014 2031  ? BUN 11 09/03/2021 1222  ? BUN 12 12/12/2020 1518  ? BUN 15 11/17/2014 2031  ? CREATININE 0.72 09/03/2021 1222  ? CREATININE 0.73 04/28/2016 1003  ? CALCIUM 9.8 09/03/2021 1222  ? CALCIUM 9.2 11/17/2014 2031  ? GFRNONAA >60 06/02/2021 1307  ? GFRNONAA >60 11/17/2014 2031  ? GFRAA 84 07/18/2020 1205  ? GFRAA >60 11/17/2014 2031  ? ?Dementia: They note this is getting worse.  She notes she could not picture my face though did recognize my name.  They feel as though the trazodone 25 mg in the afternoon 50 mg in the evening has been beneficial along with the Remeron 7.5 mg daily.  She does report some depression and cries a lot.  No SI. ? ?Urinary frequency: Patient notes this has been going on a few weeks.  She also reports urgency and dysuria.  No blood in her urine, vaginal discharge, fevers, or abdominal pain. ? ?Social History  ? ?Tobacco Use  ?Smoking Status Never  ?Smokeless Tobacco Never  ? ? ?Current Outpatient Medications on File Prior to Visit  ?Medication Sig Dispense Refill  ? acetaminophen (TYLENOL) 500 MG tablet Take 1,000 mg by mouth in the morning and at bedtime.    ? aspirin 81 MG tablet Take 81 mg by mouth daily.    ? atorvastatin (LIPITOR) 40 MG tablet TAKE 1 TABLET BY MOUTH EVERY DAY 90 tablet 3  ? Calcium Carbonate-Vit D-Min (CALCIUM 1200 PO)  Take 1,200 mg by mouth daily.    ? carvedilol (COREG) 6.25 MG tablet TAKE 1 TABLET(6.25 MG) BY MOUTH TWICE DAILY WITH A MEAL 60 tablet 5  ? Cholecalciferol 5000 units TABS Take 5,000 Units by mouth daily.    ? colestipol (COLESTID) 1 g tablet TAKE 1 TABLET(1 GRAM) BY MOUTH DAILY 90 tablet 1  ? isosorbide mononitrate (IMDUR) 30 MG 24 hr tablet Take 1 tablet (30 mg total) by mouth daily. 90 tablet 1  ? magnesium oxide (MAG-OX) 400 MG tablet Take 400 mg by mouth in the morning, at noon, and at bedtime.    ? MAGNESIUM-OXIDE 400 (240 Mg) MG tablet Take by mouth in the morning, at noon, and at bedtime.    ? melatonin 3 MG TABS tablet Take by mouth.    ? Multiple Vitamin (MULTIVITAMIN) capsule Take 1 capsule by mouth daily.    ? nitroGLYCERIN (NITROSTAT) 0.4 MG SL tablet PLACE 1 TABLET UNDER THE TONGUE EVERY 5 MINUTES AS NEEDED FOR CHEST PAIN, 3 DOSES MAX 25 tablet 1  ? Nutritional Supplements (OSTEO ADVANCE) TABS Take 1 tablet by mouth daily.     ? pantoprazole (PROTONIX) 40 MG tablet TAKE 1 TABLET(40 MG) BY MOUTH DAILY 90 tablet 0  ? potassium  chloride SA (KLOR-CON) 20 MEQ tablet Take 1 tablet (20 mEq total) by mouth daily. 90 tablet 3  ? traZODone (DESYREL) 50 MG tablet Take 1 tablet (50 mg total) by mouth daily in the afternoon AND 1 tablet (50 mg total) at bedtime. 180 tablet 3  ? Ubiquinol 100 MG CAPS Take 100 mg by mouth every morning.    ? ?Current Facility-Administered Medications on File Prior to Visit  ?Medication Dose Route Frequency Provider Last Rate Last Admin  ? sodium chloride 0.9 % nebulizer solution 3 mL  3 mL Nebulization Once [name], MD      ? Followed by  ? methacholine (PROVOCHOLINE) inhaler solution 0.125 mg  2 mL Inhalation Once [name], MD      ? Followed by  ? methacholine (PROVOCHOLINE) inhaler solution 0.5 mg  2 mL Inhalation Once [name], MD      ? Followed by  ? methacholine (PROVOCHOLINE) inhaler solution 2 mg  2 mL Inhalation Once [name], MD       ? Followed by  ? methacholine (PROVOCHOLINE) inhaler solution 8 mg  2 mL Inhalation Once [name], MD      ? Followed by  ? methacholine (PROVOCHOLINE) inhaler solution 32 mg  2 mL Inhalation Once [name], MD      ? ? ? ?ROS see history of present illness ? ?Objective ? ?Physical Exam ?Vitals:  ? 12/02/21 1355  ?BP: 120/70  ?Pulse: 77  ?Temp: 97.8 ?F (36.6 ?C)  ?SpO2: 96%  ? ? ?BP Readings from Last 3 Encounters:  ?12/02/21 120/70  ?11/21/21 140/60  ?09/03/21 (!) 160/80  ? ?Wt Readings from Last 3 Encounters:  ?12/02/21 112 lb 9.6 oz (51.1 kg)  ?11/21/21 113 lb 2 oz (51.3 kg)  ?09/03/21 112 lb 3.2 oz (50.9 kg)  ? ? ?Physical Exam ?Constitutional:   ?   General: She is not in acute distress. ?   Appearance: She is not diaphoretic.  ?Cardiovascular:  ?   Rate and Rhythm: Normal rate and regular rhythm.  ?   Heart sounds: Murmur (3/6 systolic murmur) heard.  ?Pulmonary:  ?   Effort: Pulmonary effort is normal.  ?   Breath sounds: Normal breath sounds.  ?Skin: ?   General: Skin is warm and dry.  ?Neurological:  ?   Mental Status: She is alert.  ? ? ? ?Assessment/Plan: Please see individual problem list. ? ?Problem List Items Addressed This Visit   ? ? Anxiety and depression (Chronic)  ?  They will try increasing her mirtazapine to 15 mg daily.  She will monitor for drowsiness with this increase. ? ?  ?  ? Relevant Medications  ? mirtazapine (REMERON) 15 MG tablet  ? Dementia ([place]) (Chronic)  ?  Chronic issue.  We will refer to neurology.  She can continue trazodone 25 mg in the afternoon and 50 mg at night to help with sundowning. ? ?  ?  ? Relevant Medications  ? mirtazapine (REMERON) 15 MG tablet  ? Other Relevant Orders  ? Ambulatory referral to Neurology  ? Essential hypertension - Primary (Chronic)  ?  Well-controlled.  She will continue Imdur 30 mg once daily and carvedilol 6.25 mg twice daily. ? ?  ?  ? Hyponatremia (Chronic)  ? Relevant Orders  ? Basic Metabolic Panel (BMET)  ? Urine  frequency  ?  Symptoms concerning for UTI.  Urinalysis is concerning for UTI.  We will send  urine for culture and microscopy.  We will treat with Keflex 500 mg 4 times daily. ? ?  ?  ? Relevant Medications  ? cephALEXin (KEFLEX) 500 MG capsule  ? Other Relevant Orders  ? POCT Urinalysis Dipstick  ? Urine Culture  ? Urine Microscopic  ? ? ?Return in about 3 months (around 03/04/2022). ? ? ?[name], MD ?[place] ? ?

## 2021-12-02 NOTE — Assessment & Plan Note (Signed)
Chronic issue.  We will refer to neurology.  She can continue trazodone 25 mg in the afternoon and 50 mg at night to help with sundowning. ?

## 2021-12-02 NOTE — Patient Instructions (Signed)
Nice to see you. ?We will contact you with your urine results and lab results. ?Please increase your mirtazapine to 15 mg nightly.  Please monitor for drowsiness with this increase. ?Neurology should contact you to set up an appointment to evaluate your dementia. ?

## 2021-12-02 NOTE — Assessment & Plan Note (Signed)
They will try increasing her mirtazapine to 15 mg daily.  She will monitor for drowsiness with this increase. ?

## 2021-12-03 ENCOUNTER — Other Ambulatory Visit: Payer: Self-pay | Admitting: Family Medicine

## 2021-12-03 DIAGNOSIS — E871 Hypo-osmolality and hyponatremia: Secondary | ICD-10-CM

## 2021-12-03 LAB — URINALYSIS, MICROSCOPIC ONLY

## 2021-12-05 ENCOUNTER — Other Ambulatory Visit (INDEPENDENT_AMBULATORY_CARE_PROVIDER_SITE_OTHER): Payer: Medicare Other

## 2021-12-05 DIAGNOSIS — E871 Hypo-osmolality and hyponatremia: Secondary | ICD-10-CM

## 2021-12-05 LAB — BASIC METABOLIC PANEL
BUN: 10 mg/dL (ref 6–23)
CO2: 26 mEq/L (ref 19–32)
Calcium: 9.2 mg/dL (ref 8.4–10.5)
Chloride: 96 mEq/L (ref 96–112)
Creatinine, Ser: 0.67 mg/dL (ref 0.40–1.20)
GFR: 80.14 mL/min (ref >60.00)
Glucose, Bld: 113 mg/dL — ABNORMAL HIGH (ref 70–99)
Potassium: 4.3 mEq/L (ref 3.5–5.1)
Sodium: 131 mEq/L — ABNORMAL LOW (ref 135–145)

## 2021-12-06 LAB — URINE CULTURE
MICRO NUMBER:: 13364840
SPECIMEN QUALITY:: ADEQUATE

## 2021-12-09 ENCOUNTER — Other Ambulatory Visit: Payer: Self-pay

## 2021-12-09 DIAGNOSIS — E871 Hypo-osmolality and hyponatremia: Secondary | ICD-10-CM

## 2021-12-12 ENCOUNTER — Encounter: Payer: Self-pay | Admitting: Physician Assistant

## 2021-12-24 ENCOUNTER — Other Ambulatory Visit (INDEPENDENT_AMBULATORY_CARE_PROVIDER_SITE_OTHER): Payer: Medicare Other

## 2021-12-24 DIAGNOSIS — E871 Hypo-osmolality and hyponatremia: Secondary | ICD-10-CM

## 2021-12-24 LAB — BASIC METABOLIC PANEL
BUN: 13 mg/dL (ref 6–23)
CO2: 26 mEq/L (ref 19–32)
Calcium: 9.3 mg/dL (ref 8.4–10.5)
Chloride: 98 mEq/L (ref 96–112)
Creatinine, Ser: 0.8 mg/dL (ref 0.40–1.20)
GFR: 67.53 mL/min (ref >60.00)
Glucose, Bld: 121 mg/dL — ABNORMAL HIGH (ref 70–99)
Potassium: 4 mEq/L (ref 3.5–5.1)
Sodium: 133 mEq/L — ABNORMAL LOW (ref 135–145)

## 2021-12-29 ENCOUNTER — Other Ambulatory Visit: Payer: Self-pay | Admitting: Nurse Practitioner

## 2022-01-09 ENCOUNTER — Encounter: Payer: Self-pay | Admitting: Physician Assistant

## 2022-01-09 ENCOUNTER — Ambulatory Visit (INDEPENDENT_AMBULATORY_CARE_PROVIDER_SITE_OTHER): Payer: Medicare Other | Admitting: Physician Assistant

## 2022-01-09 VITALS — BP 189/81 | HR 81 | Resp 18 | Ht 59.0 in | Wt 116.0 lb

## 2022-01-09 DIAGNOSIS — I2510 Atherosclerotic heart disease of native coronary artery without angina pectoris: Secondary | ICD-10-CM | POA: Diagnosis not present

## 2022-01-09 DIAGNOSIS — R413 Other amnesia: Secondary | ICD-10-CM

## 2022-01-09 DIAGNOSIS — F0150 Vascular dementia without behavioral disturbance: Secondary | ICD-10-CM | POA: Diagnosis not present

## 2022-01-09 DIAGNOSIS — G309 Alzheimer's disease, unspecified: Secondary | ICD-10-CM | POA: Diagnosis not present

## 2022-01-09 DIAGNOSIS — F0280 Dementia in other diseases classified elsewhere without behavioral disturbance: Secondary | ICD-10-CM

## 2022-01-09 MED ORDER — MEMANTINE HCL 5 MG PO TABS: ORAL_TABLET | ORAL | 11 refills | Status: DC

## 2022-01-09 NOTE — Progress Notes (Cosign Needed)
Assessment/Plan:    The patient is seen in neurologic consultation at the request of Amy Haven, MD for the evaluation of memory.  Amy Morton is a very pleasant 87 y.o. year old RH female with  a history of hypertension, hyperlipidemia, Aortic stenosis, CAD, anemia, vit D deficiency, chronic hyponatremia (133), anxiety, depression, h/o small R parietal ischemic stroke, likely embolic (70/9628) on ASA , seen today for evaluation of memory loss. MoCA today is 19/30, delayed recall 0/5, visuospatial 2/5.  Findings are concerning for mixed Alzheimer's dementia and vascular disease.   Recommendations:   Dementia due to vascular and Alzheimer's Disease   MRI brain with/without contrast to assess for underlying structural abnormality and assess vascular load  Neurocognitive testing to further evaluate cognitive concerns and determine other underlying cause of memory changes, including potential contribution from sleep, anxiety, or depression  Check B12, TSH Folllow up in 3 months Start memantine 5 mg, take 1 tab p.o. nightly, then increase to 5 mg p.o. twice daily if tolerated.  Side effects discussed. Monitor sodium levels by PCP. BP today elevated 189/81, continue follow-up monitoring with PCP. Monitor mood by PCP and Psych   Subjective:    The patient is accompanied by her son Amy Morton who supplements the history.    How long did patient have memory difficulties?  Patient denies any issues with her memory, but her son reports that her memory difficulties have been present "For a while, about 3 years, but over the last year after stabilizing her blood pressure, may be slightly better ".  Difficulty is with both short and long-term memory, sometimes unable to recognize the faces of her loved ones. She maintains her memory with frequent crossword puzzles and word finding.  Patient lives with: alone for the last 13 years, after the death of her husband. repeats oneself?  Patient  denies, but her son reports that she does repeat herself "because she does not remember ". Disoriented when walking into a room?  Patient denies   Leaving objects in unusual places?  "Yes I lose some stuff, but doesn't everybody do that?  Ambulates  with difficulty?   Patient denies   Recent falls?  Patient denies   Any head injuries?  Patient denies   History of seizures?   Patient denies   Wandering behavior?  Patient denies   Patient drives?   Patient no longer drives.  There was one episode in which he became disoriented, so she gave her keys out about 3 years ago.   Any mood changes?  PCP and Psychiatry manages mood, currently on trazodone and Remeron Any history of depression?:  Family reports that she has some crying spells every time she thinks of her deceased sons.  Hallucinations?  Patient denies   Paranoia?  Patient denies   Patient reports that he sleeps well without vivid dreams, REM behavior or sleepwalking   History of sleep apnea?  Patient denies   Any hygiene concerns?  Patient denies   Independent of bathing and dressing?  Endorsed  Does the patient needs help with medications? A family friend helps her with the medicines and places it in a pillbox  Who is in charge of the finances?  Patient is in charge but her friend writes the checks  Any changes in appetite?  Patient denies   Patient have trouble swallowing? Patient denies   Does the patient cook?  Patient denies   Any kitchen accidents such as leaving the stove on?  Patient denies   Any headaches?  Patient denies at this time, but it was likely due to HTN   The double vision? Patient denies   Any focal numbness or tingling?  Patient denies   Chronic back pain Patient denies   Unilateral weakness?  Patient denies   Any tremors?  Patient denies   Any history of anosmia?  Patient denies   Any incontinence of urine? She has a history of urinary frequency, occasional dysuria,  recent UTI treated with antibiotics Any  bowel dysfunction?   Patient denies  History of heavy alcohol intake?  Patient denies   History of heavy tobacco use?  Patient denies   Family history of dementia?  Patient denies     Allergies  Allergen Reactions   Abilify [Aripiprazole] Other (See Comments)    Burning in Chest Elevated b/p Insomnia   Celexa [Citalopram]     Caused low sodium level.    Ibuprofen Other (See Comments)    GIB    Current Outpatient Medications  Medication Instructions   acetaminophen (TYLENOL) 1,000 mg, Oral, 2 times daily   aspirin 81 mg, Oral, Daily   atorvastatin (LIPITOR) 40 MG tablet TAKE 1 TABLET BY MOUTH EVERY DAY   Calcium Carbonate-Vit D-Min (CALCIUM 1200 PO) 1,200 mg, Oral, Daily   carvedilol (COREG) 6.25 MG tablet TAKE 1 TABLET(6.25 MG) BY MOUTH TWICE DAILY WITH A MEAL   cephALEXin (KEFLEX) 500 mg, Oral, 4 times daily   Cholecalciferol 5,000 Units, Oral, Daily   colestipol (COLESTID) 1 g tablet TAKE 1 TABLET(1 GRAM) BY MOUTH DAILY   isosorbide mononitrate (IMDUR) 30 mg, Oral, Daily   magnesium oxide (MAG-OX) 400 mg, Oral, 3 times daily   MAGNESIUM-OXIDE 400 (240 Mg) MG tablet Oral, 3 times daily   melatonin 3 MG TABS tablet Oral   memantine (NAMENDA) 5 MG tablet Take 1 tablet (5 mg at night) for 2 weeks, then increase to 1 tablet (5 mg) twice a day   mirtazapine (REMERON) 15 mg, Oral, Daily at bedtime   Multiple Vitamin (MULTIVITAMIN) capsule 1 capsule, Oral, Daily   nitroGLYCERIN (NITROSTAT) 0.4 MG SL tablet PLACE 1 TABLET UNDER THE TONGUE EVERY 5 MINUTES AS NEEDED FOR CHEST PAIN, 3 DOSES MAX   Nutritional Supplements (OSTEO ADVANCE) TABS 1 tablet, Oral, Daily   pantoprazole (PROTONIX) 40 MG tablet TAKE 1 TABLET(40 MG) BY MOUTH DAILY   potassium chloride SA (KLOR-CON M) 20 MEQ tablet TAKE 1 TABLET(20 MEQ) BY MOUTH DAILY   traZODone (DESYREL) 50 MG tablet Take 1 tablet (50 mg total) by mouth daily in the afternoon AND 1 tablet (50 mg total) at bedtime.   Ubiquinol 100 mg, Oral, Every  morning     VITALS:   Vitals:   01/09/22 1308  BP: (!) 189/81  Pulse: 81  Resp: 18  SpO2: 98%  Weight: 116 lb (52.6 kg)  Height: '4\' 11"'$  (1.499 m)      06/10/2021    3:58 PM 05/13/2021    3:05 PM 04/08/2021    1:13 PM 03/25/2021   10:03 AM 11/22/2020   11:06 AM  Depression screen PHQ 2/9  Decreased Interest 0  3  0  Down, Depressed, Hopeless 0  3  0  PHQ - 2 Score 0  6  0  Altered sleeping   3    Tired, decreased energy   3    Change in appetite   3    Feeling bad or failure about yourself    0  Trouble concentrating   3    Moving slowly or fidgety/restless   0    Suicidal thoughts   0    PHQ-9 Score   18    Difficult doing work/chores   Somewhat difficult       Information is confidential and restricted. Go to Review Flowsheets to unlock data.    PHYSICAL EXAM   HEENT:  Normocephalic, atraumatic. The mucous membranes are moist. The superficial temporal arteries are without ropiness or tenderness. Cardiovascular: Regular rate and rhythm. Lungs: Clear to auscultation bilaterally. Neck: There are no carotid bruits noted bilaterally.  NEUROLOGICAL:    01/10/2022    8:00 AM  Montreal Cognitive Assessment   Visuospatial/ Executive (0/5) 2  Naming (0/3) 3  Attention: Read list of digits (0/2) 2  Attention: Read list of letters (0/1) 1  Attention: Serial 7 subtraction starting at 100 (0/3) 3  Language: Repeat phrase (0/2) 2  Language : Fluency (0/1) 0  Abstraction (0/2) 1  Delayed Recall (0/5) 0  Orientation (0/6) 5  Total 19  Adjusted Score (based on education) 19       06/10/2021    4:27 PM 04/24/2020   11:57 AM 03/05/2018    1:31 PM  MMSE - Mini Mental State Exam  Not completed: Unable to complete    Orientation to time  4 5  Orientation to Place  5 5  Registration  3 3  Attention/ Calculation  4 5  Recall  3 1  Recall-comments   1 out of 3 words recalled  Language- name 2 objects  2 2  Language- repeat  1 1  Language- follow 3 step command  3 3   Language- read & follow direction  1 1  Write a sentence  1 1  Copy design  1 1  Total score  28 28     Orientation:  Alert and oriented to person, place and time. No aphasia or dysarthria. Fund of knowledge is appropriate. Recent memory impaired and remote memory intact.  Attention and concentration are normal.  Able to name objects and repeat phrases. Delayed recall 0/5  Cranial nerves: There is good facial symmetry. Extraocular muscles are intact and visual fields are full to confrontational testing. Speech is fluent and clear. Soft palate rises symmetrically and there is no tongue deviation. Hearing is intact to conversational tone. Tone: Tone is good throughout. Sensation: Sensation is intact to light touch and pinprick throughout. Vibration is intact at the bilateral big toe.There is no extinction with double simultaneous stimulation. There is no sensory dermatomal level identified. Coordination: The patient has no difficulty with RAM's or FNF bilaterally. Normal finger to nose  Motor: Strength is 5/5 in the bilateral upper and lower extremities. There is no pronator drift. There are no fasciculations noted. DTR's: Deep tendon reflexes are 2/4 at the bilateral biceps, triceps, brachioradialis, patella and achilles.  Plantar responses are downgoing bilaterally. Gait and Station: The patient is able to ambulate without difficulty.The patient is able to heel toe walk without any difficulty.The patient is able to ambulate in a tandem fashion. The patient is able to stand in the Romberg position.     Thank you for allowing [place] the opportunity to participate in the care of this nice patient. Please do not hesitate to contact us for any questions or concerns.   Total time spent on today's visit was 61 minutes dedicated to this patient today, preparing to see patient, examining the patient, ordering tests and/or medications  and counseling the patient, documenting clinical information in the EHR or  other health record, independently interpreting results and communicating results to the patient/family, discussing treatment and goals, answering patient's questions and coordinating care.  Cc:  Amy Haven, MD  Sharene Butters 01/10/2022 8:28 AM

## 2022-01-09 NOTE — Patient Instructions (Addendum)
It was a pleasure to see you today at our office.   Recommendations:  Follow up in 3  months  Start Memantine 5 mg: Take 1 tablet (5 mg at night) for 2 weeks, then increase to 1 tablet (5 mg) twice a day   Labs today MRI brain     Whom to call:  Memory  decline, memory medications: Call our office [phone]   For psychiatric meds, mood meds: Please have your primary care physician manage these medications.   Counseling regarding caregiver distress, including caregiver depression, anxiety and issues regarding community resources, adult day care programs, adult living facilities, or memory care questions:   Feel free to contact [place], Social Worker at [phone]   For assessment of decision of mental capacity and competency:  Call Dr. Anthoney Harada, geriatric psychiatrist at [phone]  For guidance in geriatric dementia issues please call [facility] [phone]  For guidance regarding [facility] and if placement were needed at the facility, contact Arnell Asal, Social Worker tel: [phone]  If you have any severe symptoms of a stroke, or other severe issues such as confusion,severe chills or fever, etc call 911 or go to the ER as you may need to be evaluated further   Feel free to visit Facebook page " [email or web address]" for tips of how to care for people with memory problems.     RECOMMENDATIONS FOR ALL PATIENTS WITH MEMORY PROBLEMS: 1. Continue to exercise (Recommend 30 minutes of walking everyday, or 3 hours every week) 2. Increase social interactions - continue going to [place] and enjoy social gatherings with friends and family 3. Eat healthy, avoid fried foods and eat more fruits and vegetables 4. Maintain adequate blood pressure, blood sugar, and blood cholesterol level. Reducing the risk of stroke and cardiovascular disease also helps promoting better memory. 5. Avoid stressful situations. Live a simple life and avoid  aggravations. Organize your time and prepare for the next day in anticipation. 6. Sleep well, avoid any interruptions of sleep and avoid any distractions in the bedroom that may interfere with adequate sleep quality 7. Avoid sugar, avoid sweets as there is a strong link between excessive sugar intake, diabetes, and cognitive impairment We discussed the Mediterranean diet, which has been shown to help patients reduce the risk of progressive memory disorders and reduces cardiovascular risk. This includes eating fish, eat fruits and green leafy vegetables, nuts like almonds and hazelnuts, walnuts, and also use olive oil. Avoid fast foods and fried foods as much as possible. Avoid sweets and sugar as sugar use has been linked to worsening of memory function.  There is always a concern of gradual progression of memory problems. If this is the case, then we may need to adjust level of care according to patient needs. Support, both to the patient and caregiver, should then be put into place.    FALL PRECAUTIONS: Be cautious when walking. Scan the area for obstacles that may increase the risk of trips and falls. When getting up in the mornings, sit up at the edge of the bed for a few minutes before getting out of bed. Consider elevating the bed at the head end to avoid drop of blood pressure when getting up. Walk always in a well-lit room (use night lights in the walls). Avoid area rugs or power cords from appliances in the middle of the walkways. Use a walker or a cane if necessary and consider physical therapy for balance exercise. Get your eyesight  checked regularly.  FINANCIAL OVERSIGHT: Supervision, especially oversight when making financial decisions or transactions is also recommended.  HOME SAFETY: Consider the safety of the kitchen when operating appliances like stoves, microwave oven, and blender. Consider having supervision and share cooking responsibilities until no longer able to participate in  those. Accidents with firearms and other hazards in the house should be identified and addressed as well.   ABILITY TO BE LEFT ALONE: If patient is unable to contact 911 operator, consider using LifeLine, or when the need is there, arrange for someone to stay with patients. Smoking is a fire hazard, consider supervision or cessation. Risk of wandering should be assessed by caregiver and if detected at any point, supervision and safe proof recommendations should be instituted.  MEDICATION SUPERVISION: Inability to self-administer medication needs to be constantly addressed. Implement a mechanism to ensure safe administration of the medications.      MRI brain   We have sent a referral to [facility] for your MRI and they will call you directly to schedule your appointment. They are located at [address]. If you need to contact them directly please call [phone].   Your provider has requested that you have labwork completed today. Please go to [facility] ([id number]) on the second floor of this building before leaving the office today. You do not need to check in. If you are not called within 15 minutes please check with the front desk.

## 2022-01-14 ENCOUNTER — Other Ambulatory Visit: Payer: Self-pay | Admitting: Physician Assistant

## 2022-01-15 LAB — TSH: TSH: 2.03 u[IU]/mL (ref 0.450–4.500)

## 2022-01-15 LAB — SPECIMEN STATUS REPORT

## 2022-01-15 LAB — VITAMIN B12: Vitamin B-12: 872 pg/mL (ref 232–1245)

## 2022-01-16 NOTE — Progress Notes (Signed)
Please inform patient her blood tests are normal thanks

## 2022-01-21 ENCOUNTER — Encounter: Payer: Self-pay | Admitting: Physician Assistant

## 2022-01-27 ENCOUNTER — Encounter: Payer: Self-pay | Admitting: Physician Assistant

## 2022-01-29 ENCOUNTER — Other Ambulatory Visit: Payer: Self-pay | Admitting: Family Medicine

## 2022-01-30 ENCOUNTER — Other Ambulatory Visit: Payer: Self-pay | Admitting: Cardiovascular Disease

## 2022-01-30 DIAGNOSIS — I10 Essential (primary) hypertension: Secondary | ICD-10-CM

## 2022-02-13 ENCOUNTER — Other Ambulatory Visit: Payer: Self-pay | Admitting: Nurse Practitioner

## 2022-02-17 ENCOUNTER — Other Ambulatory Visit: Payer: Self-pay | Admitting: Cardiovascular Disease

## 2022-03-04 ENCOUNTER — Encounter: Payer: Self-pay | Admitting: Family Medicine

## 2022-03-04 ENCOUNTER — Ambulatory Visit (INDEPENDENT_AMBULATORY_CARE_PROVIDER_SITE_OTHER): Payer: Medicare Other | Admitting: Family Medicine

## 2022-03-04 DIAGNOSIS — F32A Depression, unspecified: Secondary | ICD-10-CM

## 2022-03-04 DIAGNOSIS — F419 Anxiety disorder, unspecified: Secondary | ICD-10-CM | POA: Diagnosis not present

## 2022-03-04 DIAGNOSIS — F03B0 Unspecified dementia, moderate, without behavioral disturbance, psychotic disturbance, mood disturbance, and anxiety: Secondary | ICD-10-CM | POA: Diagnosis not present

## 2022-03-04 DIAGNOSIS — K219 Gastro-esophageal reflux disease without esophagitis: Secondary | ICD-10-CM

## 2022-03-04 DIAGNOSIS — I2510 Atherosclerotic heart disease of native coronary artery without angina pectoris: Secondary | ICD-10-CM | POA: Diagnosis not present

## 2022-03-04 DIAGNOSIS — I10 Essential (primary) hypertension: Secondary | ICD-10-CM | POA: Diagnosis not present

## 2022-03-04 NOTE — Patient Instructions (Addendum)
Nice to see you. Please contact us if you do not hear back about potential services that could be provided to Amy Morton. We will increase the mirtazapine to 15 mg daily.  If you have any drowsiness or side effects with this please let us know.

## 2022-03-04 NOTE — Assessment & Plan Note (Signed)
Chronic issue that is well controlled.  She will continue Protonix 40 mg daily.

## 2022-03-04 NOTE — Assessment & Plan Note (Signed)
Adequately controlled for her age and her history of aortic stenosis.  She will continue Imdur 30 mg daily and carvedilol 6.25 mg twice daily.

## 2022-03-04 NOTE — Assessment & Plan Note (Addendum)
Generally stable.  They will continue to monitor.  I will check with our clinic RN/manager to see if she knows of any services that the patient's sister asked about.

## 2022-03-04 NOTE — Progress Notes (Signed)
[name], MD Phone: [phone]  Amy Morton is a 87 y.o. female who presents today for follow-up.  Dementia/anxiety/depression: The patient's sister notes her memory has gotten worse.  She has had more anxiety recently and some depression.  No SI.  She has been taking mirtazapine 7.5 mg daily as well as trazodone 50 mg in the afternoon 50 mg at bedtime.  She is not taking Namenda.  She did see neurology and they recommended MRI though the patient opted against this.  Her sister asks if there is some kind of service that will come to the house once a week to spend time with the patient.  HYPERTENSION Disease Monitoring Home BP Monitoring 150s/80s, notes on 2 occasions since her last visit it went up much higher than this.  Once the patient calm down the blood pressure trended down. Chest pain- no    Dyspnea- no Medications Compliance-  taking imdur, coreg.  BMET    Component Value Date/Time   NA 133 (L) 12/24/2021 1008   NA 128 (L) 12/12/2020 1518   NA 135 11/17/2014 2031   K 4.0 12/24/2021 1008   K 3.5 11/17/2014 2031   CL 98 12/24/2021 1008   CL 101 11/17/2014 2031   CO2 26 12/24/2021 1008   CO2 23 11/17/2014 2031   GLUCOSE 121 (H) 12/24/2021 1008   GLUCOSE 119 (H) 11/17/2014 2031   BUN 13 12/24/2021 1008   BUN 12 12/12/2020 1518   BUN 15 11/17/2014 2031   CREATININE 0.80 12/24/2021 1008   CREATININE 0.73 04/28/2016 1003   CALCIUM 9.3 12/24/2021 1008   CALCIUM 9.2 11/17/2014 2031   GFRNONAA >60 06/02/2021 1307   GFRNONAA >60 11/17/2014 2031   GFRAA 84 07/18/2020 1205   GFRAA >60 11/17/2014 2031   GERD: Taking Protonix.  No reflux, blood in her stool, or dysphagia.  Social History   Tobacco Use  Smoking Status Never  Smokeless Tobacco Never    Current Outpatient Medications on File Prior to Visit  Medication Sig Dispense Refill   acetaminophen (TYLENOL) 500 MG tablet Take 1,000 mg by mouth in the morning and at bedtime.     aspirin 81 MG tablet Take  81 mg by mouth daily.     atorvastatin (LIPITOR) 40 MG tablet TAKE 1 TABLET BY MOUTH EVERY DAY 90 tablet 3   Calcium Carbonate-Vit D-Min (CALCIUM 1200 PO) Take 1,200 mg by mouth daily.     carvedilol (COREG) 6.25 MG tablet TAKE 1 TABLET(6.25 MG) BY MOUTH TWICE DAILY WITH A MEAL 60 tablet 5   Cholecalciferol 5000 units TABS Take 5,000 Units by mouth daily.     colestipol (COLESTID) 1 g tablet TAKE 1 TABLET(1 GRAM) BY MOUTH DAILY 90 tablet 1   isosorbide mononitrate (IMDUR) 30 MG 24 hr tablet TAKE 1 TABLET(30 MG) BY MOUTH DAILY 90 tablet 0   magnesium oxide (MAG-OX) 400 MG tablet Take 400 mg by mouth in the morning, at noon, and at bedtime.     melatonin 3 MG TABS tablet Take by mouth.     mirtazapine (REMERON) 15 MG tablet Take 1 tablet (15 mg total) by mouth at bedtime. 90 tablet 1   Multiple Vitamin (MULTIVITAMIN) capsule Take 1 capsule by mouth daily.     nitroGLYCERIN (NITROSTAT) 0.4 MG SL tablet PLACE 1 TABLET UNDER THE TONGUE EVERY 5 MINUTES AS NEEDED FOR CHEST PAIN, 3 DOSES MAX 25 tablet 1   Nutritional Supplements (OSTEO ADVANCE) TABS Take 1 tablet by mouth daily.  pantoprazole (PROTONIX) 40 MG tablet TAKE 1 TABLET(40 MG) BY MOUTH DAILY 90 tablet 0   potassium chloride SA (KLOR-CON M) 20 MEQ tablet TAKE 1 TABLET(20 MEQ) BY MOUTH DAILY 90 tablet 3   traZODone (DESYREL) 50 MG tablet Take 1 tablet (50 mg total) by mouth daily in the afternoon AND 1 tablet (50 mg total) at bedtime. 180 tablet 3   Ubiquinol 100 MG CAPS Take 100 mg by mouth every morning.     cephALEXin (KEFLEX) 500 MG capsule Take 1 capsule (500 mg total) by mouth 4 (four) times daily. (Patient not taking: Reported on 03/04/2022) 28 capsule 0   magnesium oxide (MAG-OX) 400 (240 Mg) MG tablet TAKE 1 TABLET(400 MG) BY MOUTH THREE TIMES DAILY (Patient not taking: Reported on 03/04/2022) 270 tablet 1   memantine (NAMENDA) 5 MG tablet Take 1 tablet (5 mg at night) for 2 weeks, then increase to 1 tablet (5 mg) twice a day (Patient not  taking: Reported on 03/04/2022) 60 tablet 11   Current Facility-Administered Medications on File Prior to Visit  Medication Dose Route Frequency Provider Last Rate Last Admin   sodium chloride 0.9 % nebulizer solution 3 mL  3 mL Nebulization Once [name], MD       Followed by   methacholine (PROVOCHOLINE) inhaler solution 0.125 mg  2 mL Inhalation Once [name], MD       Followed by   methacholine (PROVOCHOLINE) inhaler solution 0.5 mg  2 mL Inhalation Once [name], MD       Followed by   methacholine (PROVOCHOLINE) inhaler solution 2 mg  2 mL Inhalation Once [name], MD       Followed by   methacholine (PROVOCHOLINE) inhaler solution 8 mg  2 mL Inhalation Once [name], MD       Followed by   methacholine (PROVOCHOLINE) inhaler solution 32 mg  2 mL Inhalation Once [name], MD         ROS see history of present illness  Objective  Physical Exam Vitals:   03/04/22 1211  BP: 130/80  Pulse: 77  Temp: 98.4 F (36.9 C)  SpO2: 96%    BP Readings from Last 3 Encounters:  03/04/22 130/80  01/09/22 (!) 189/81  12/02/21 120/70   Wt Readings from Last 3 Encounters:  03/04/22 115 lb 6.4 oz (52.3 kg)  01/09/22 116 lb (52.6 kg)  12/02/21 112 lb 9.6 oz (51.1 kg)    Physical Exam Constitutional:      General: She is not in acute distress.    Appearance: She is not diaphoretic.  Cardiovascular:     Rate and Rhythm: Normal rate and regular rhythm.     Heart sounds: Normal heart sounds.  Pulmonary:     Effort: Pulmonary effort is normal.     Breath sounds: Normal breath sounds.  Musculoskeletal:     Right lower leg: No edema.     Left lower leg: No edema.  Skin:    General: Skin is warm and dry.  Neurological:     Mental Status: She is alert.      Assessment/Plan: Please see individual problem list.  Problem List Items Addressed This Visit     Anxiety and depression (Chronic)    Anxiety has worsened.  She  notes some mild depression.  We will increase her Remeron to 15 mg daily.  She can continue trazodone 50 mg in the afternoon and 50 mg before bedtime.  Follow-up in 6 weeks.  She was counseled on the potential for drowsiness with the increased dose of Remeron.      Dementia ([place]) (Chronic)    Generally stable.  They will continue to monitor.  I will check with our clinic RN/manager to see if she knows of any services that the patient's sister asked about.      Essential hypertension (Chronic)    Adequately controlled for her age and her history of aortic stenosis.  She will continue Imdur 30 mg daily and carvedilol 6.25 mg twice daily.      GERD (gastroesophageal reflux disease) (Chronic)    Chronic issue that is well controlled.  She will continue Protonix 40 mg daily.       Return in about 6 weeks (around 04/15/2022) for anxiety/depression.   [name], MD [place]

## 2022-03-04 NOTE — Assessment & Plan Note (Addendum)
Anxiety has worsened.  She notes some mild depression.  We will increase her Remeron to 15 mg daily.  She can continue trazodone 50 mg in the afternoon and 50 mg before bedtime.  Follow-up in 6 weeks.  She was counseled on the potential for drowsiness with the increased dose of Remeron.

## 2022-03-10 ENCOUNTER — Telehealth: Payer: Self-pay | Admitting: Family Medicine

## 2022-03-10 NOTE — Telephone Encounter (Signed)
-----   Message from Nanci Pina, RN sent at 03/10/2022  8:45 AM EDT ----- This would be considered home care unless she could ask maybe some local churches might have volunteers to go out and sit with her. The only other option would be the [facility] program but this would be taking patient to a senior daycare.  ----- Message ----- From: [name], MD Sent: 03/04/2022  12:25 PM EDT To: Nanci Pina, RN  This patients sister asked about services/organizations that would come to her home to spend time with the patient 1-2 days a week. She does not need home health or personal care services. Do you know of anything like this?

## 2022-03-10 NOTE — Telephone Encounter (Signed)
Please let the patients sister know that I heard back from our nurse. She noted that what they are requesting with someone to come to the home to spend time with the patient would be considered home care and they would need to look for a home care company if they wanted to go that route, though the patient probably would not qualify for that as they typically require the patient to have some sort of ADL issue that they help with. The other option would be checking with local churches as they might have volunteers that would go and sit with the patient.

## 2022-03-11 NOTE — Telephone Encounter (Signed)
I called and spoke with the patients sister and relayed the message to her that the patient would not qualify for home care service and her other option is to check with local churches and she stated she would check with local churches.  Param Capri,cma

## 2022-03-25 ENCOUNTER — Encounter: Payer: Self-pay | Admitting: Family Medicine

## 2022-03-25 DIAGNOSIS — F03B0 Unspecified dementia, moderate, without behavioral disturbance, psychotic disturbance, mood disturbance, and anxiety: Secondary | ICD-10-CM

## 2022-03-26 MED ORDER — TRAZODONE HCL 50 MG PO TABS: ORAL_TABLET | ORAL | 3 refills | Status: DC

## 2022-04-15 ENCOUNTER — Ambulatory Visit: Payer: Medicare Other | Admitting: Physician Assistant

## 2022-04-16 ENCOUNTER — Other Ambulatory Visit: Payer: Self-pay | Admitting: Family

## 2022-04-28 ENCOUNTER — Telehealth: Payer: Self-pay | Admitting: Podiatry

## 2022-04-28 ENCOUNTER — Other Ambulatory Visit: Payer: Self-pay | Admitting: Cardiovascular Disease

## 2022-04-28 DIAGNOSIS — I10 Essential (primary) hypertension: Secondary | ICD-10-CM

## 2022-04-28 NOTE — Telephone Encounter (Signed)
Pts caregiver called to see what can she do to help with the pain until the pt is able to be seen in office.   [id number] - [place], [place]  Please advise.

## 2022-04-29 NOTE — Telephone Encounter (Signed)
Patient has not been seen since October 2022. Recommend extra strength tylenol PRN. - Dr. Amalia Hailey

## 2022-05-02 ENCOUNTER — Encounter: Payer: Self-pay | Admitting: Family Medicine

## 2022-05-02 ENCOUNTER — Ambulatory Visit (INDEPENDENT_AMBULATORY_CARE_PROVIDER_SITE_OTHER): Payer: Medicare Other | Admitting: Family Medicine

## 2022-05-02 VITALS — BP 105/78 | HR 81 | Temp 98.3°F | Ht 59.0 in | Wt 116.2 lb

## 2022-05-02 DIAGNOSIS — L600 Ingrowing nail: Secondary | ICD-10-CM

## 2022-05-02 DIAGNOSIS — F03B0 Unspecified dementia, moderate, without behavioral disturbance, psychotic disturbance, mood disturbance, and anxiety: Secondary | ICD-10-CM

## 2022-05-02 DIAGNOSIS — F419 Anxiety disorder, unspecified: Secondary | ICD-10-CM

## 2022-05-02 DIAGNOSIS — I2510 Atherosclerotic heart disease of native coronary artery without angina pectoris: Secondary | ICD-10-CM | POA: Diagnosis not present

## 2022-05-02 DIAGNOSIS — F32A Depression, unspecified: Secondary | ICD-10-CM

## 2022-05-02 DIAGNOSIS — Z23 Encounter for immunization: Secondary | ICD-10-CM

## 2022-05-02 MED ORDER — MIRTAZAPINE 15 MG PO TABS: 30.0000 mg | ORAL_TABLET | Freq: Every day | ORAL | 1 refills | Status: DC

## 2022-05-02 MED ORDER — DOXYCYCLINE HYCLATE 100 MG PO TABS: 100.0000 mg | ORAL_TABLET | Freq: Two times a day (BID) | ORAL | 0 refills | Status: DC

## 2022-05-02 MED ORDER — CLONAZEPAM 0.5 MG PO TABS: 0.2500 mg | ORAL_TABLET | Freq: Every day | ORAL | 0 refills | Status: DC | PRN

## 2022-05-02 NOTE — Progress Notes (Signed)
[name], MD Phone: [phone]  Amy Morton is a 87 y.o. female who presents today for follow-up.  Dementia/anxiety/depression: The patient and her sister note that the dementia has been getting worse.  She also has anxiety and depression.  No SI.  She cries a lot when she is by herself.  Her sister is there 2 days a week and her grandson comes over at least once a week.  Periodically the patient has an episode where her anxiety increases significantly and her blood pressure goes up.  She always calls either her grandson or her sister to be with her.  Her sister wonders about having a small dose of clonazepam on hand to help with these episodes.  Ingrown toenail: This is on her left great toe.  She notes that been ingrown for quite some time though recently has become swollen, red, and quite painful.  She has an appointment with podiatry in 11 days for evaluation.  Social History   Tobacco Use  Smoking Status Never  Smokeless Tobacco Never    Current Outpatient Medications on File Prior to Visit  Medication Sig Dispense Refill   acetaminophen (TYLENOL) 500 MG tablet Take 1,000 mg by mouth in the morning and at bedtime.     aspirin 81 MG tablet Take 81 mg by mouth daily.     atorvastatin (LIPITOR) 40 MG tablet TAKE 1 TABLET BY MOUTH EVERY DAY 90 tablet 3   Calcium Carbonate-Vit D-Min (CALCIUM 1200 PO) Take 1,200 mg by mouth daily.     carvedilol (COREG) 6.25 MG tablet TAKE 1 TABLET(6.25 MG) BY MOUTH TWICE DAILY WITH A MEAL 60 tablet 5   Cholecalciferol 5000 units TABS Take 5,000 Units by mouth daily.     colestipol (COLESTID) 1 g tablet TAKE 1 TABLET(1 GRAM) BY MOUTH DAILY 90 tablet 1   isosorbide mononitrate (IMDUR) 30 MG 24 hr tablet TAKE 1 TABLET BY MOUTH DAILY 90 tablet 0   magnesium oxide (MAG-OX) 400 MG tablet Take 400 mg by mouth in the morning, at noon, and at bedtime.     melatonin 3 MG TABS tablet Take by mouth.     Multiple Vitamin (MULTIVITAMIN) capsule Take 1  capsule by mouth daily.     nitroGLYCERIN (NITROSTAT) 0.4 MG SL tablet PLACE 1 TABLET UNDER THE TONGUE EVERY 5 MINUTES AS NEEDED FOR CHEST PAIN, 3 DOSES MAX 25 tablet 1   Nutritional Supplements (OSTEO ADVANCE) TABS Take 1 tablet by mouth daily.      pantoprazole (PROTONIX) 40 MG tablet TAKE 1 TABLET(40 MG) BY MOUTH DAILY 90 tablet 0   potassium chloride SA (KLOR-CON M) 20 MEQ tablet TAKE 1 TABLET(20 MEQ) BY MOUTH DAILY 90 tablet 3   traZODone (DESYREL) 50 MG tablet Take 1 tablet (50 mg total) by mouth daily in the afternoon AND 1 tablet (50 mg total) at bedtime. 180 tablet 3   Ubiquinol 100 MG CAPS Take 100 mg by mouth every morning.     Current Facility-Administered Medications on File Prior to Visit  Medication Dose Route Frequency Provider Last Rate Last Admin   sodium chloride 0.9 % nebulizer solution 3 mL  3 mL Nebulization Once [name], MD       Followed by   methacholine (PROVOCHOLINE) inhaler solution 0.125 mg  2 mL Inhalation Once [name], MD       Followed by   methacholine (PROVOCHOLINE) inhaler solution 0.5 mg  2 mL Inhalation Once [name], MD  Followed by   methacholine (PROVOCHOLINE) inhaler solution 2 mg  2 mL Inhalation Once [name], MD       Followed by   methacholine (PROVOCHOLINE) inhaler solution 8 mg  2 mL Inhalation Once [name], MD       Followed by   methacholine (PROVOCHOLINE) inhaler solution 32 mg  2 mL Inhalation Once [name], MD         ROS see history of present illness  Objective  Physical Exam Vitals:   05/02/22 1038  BP: 105/78  Pulse: 81  Temp: 98.3 F (36.8 C)  SpO2: 96%    BP Readings from Last 3 Encounters:  05/02/22 105/78  03/04/22 130/80  01/09/22 (!) 189/81   Wt Readings from Last 3 Encounters:  05/02/22 116 lb 3.2 oz (52.7 kg)  03/04/22 115 lb 6.4 oz (52.3 kg)  01/09/22 116 lb (52.6 kg)    Physical Exam Constitutional:      General: She is not in acute  distress.    Appearance: She is not diaphoretic.  Cardiovascular:     Rate and Rhythm: Normal rate and regular rhythm.     Heart sounds: Murmur (3/6 systolic murmur right upper sternal border) heard.  Pulmonary:     Effort: Pulmonary effort is normal.     Breath sounds: Normal breath sounds.  Skin:    General: Skin is warm and dry.     Comments: Left great toenail fibular aspect appears to have ingrown toenail with swelling, erythema, and tenderness  Neurological:     Mental Status: She is alert.      Assessment/Plan: Please see individual problem list.  Problem List Items Addressed This Visit     Anxiety and depression (Chronic)    Anxiety and depression remains uncontrolled.  We will try increasing the Remeron to 30 mg daily.  I will provide a small prescription for clonazepam 0.25 mg once daily as needed for significant anxiety.  Discussed that this only should be taken in the presence of somebody else given her drowsiness issues with consistent clonazepam use in the past.  If she has any excessive drowsiness or other side effects with the clonazepam she will let us know.  I do not think hydroxyzine is necessarily a better option than clonazepam given the potential side effects associated with hydroxyzine in her age group.      Relevant Medications   clonazePAM (KLONOPIN) 0.5 MG tablet   mirtazapine (REMERON) 15 MG tablet   Dementia (HCC) - Primary (Chronic)    Memory progressively worsens.  They will continue to monitor.      Relevant Medications   clonazePAM (KLONOPIN) 0.5 MG tablet   mirtazapine (REMERON) 15 MG tablet   Ingrown toenail of left foot with infection    Patient with parent infection related to her ingrown toenail.  I will treat with doxycycline 100 mg twice daily for 7 days.  She will see podiatry as planned.  Discussed risk of skin sensitivity to the sun and diarrhea with this antibiotic.      Relevant Medications   doxycycline (VIBRA-TABS) 100 MG tablet    Other Visit Diagnoses     Need for immunization against influenza       Relevant Orders   Flu Vaccine QUAD High Dose(Fluad) (Completed)       Return in about 3 months (around 08/02/2022) for Dementia/anxiety/depression.   [name], MD [facility]

## 2022-05-02 NOTE — Assessment & Plan Note (Signed)
Anxiety and depression remains uncontrolled.  We will try increasing the Remeron to 30 mg daily.  I will provide a small prescription for clonazepam 0.25 mg once daily as needed for significant anxiety.  Discussed that this only should be taken in the presence of somebody else given her drowsiness issues with consistent clonazepam use in the past.  If she has any excessive drowsiness or other side effects with the clonazepam she will let us know.  I do not think hydroxyzine is necessarily a better option than clonazepam given the potential side effects associated with hydroxyzine in her age group.

## 2022-05-02 NOTE — Assessment & Plan Note (Signed)
Patient with parent infection related to her ingrown toenail.  I will treat with doxycycline 100 mg twice daily for 7 days.  She will see podiatry as planned.  Discussed risk of skin sensitivity to the sun and diarrhea with this antibiotic.

## 2022-05-02 NOTE — Patient Instructions (Addendum)
Nice to see you. We will increase your mirtazapine to 30 mg daily.  If you have excessive drowsiness with this please let us know and we can reduce the dose back to 15 mg daily. I provided you with a small dose of clonazepam to use for excessive anxiety.  If you have excessive drowsiness or other side effects with this please let us know. Please see the podiatrist for your ingrown toenail.  I did send in doxycycline for you to start on.  This can cause some sensitivity to the sun so if you are going outside you need to wear good sun protection.  If you develop diarrhea with this please let us know.

## 2022-05-02 NOTE — Assessment & Plan Note (Signed)
Memory progressively worsens.  They will continue to monitor.

## 2022-05-08 DIAGNOSIS — Z23 Encounter for immunization: Secondary | ICD-10-CM | POA: Diagnosis not present

## 2022-05-13 ENCOUNTER — Ambulatory Visit (INDEPENDENT_AMBULATORY_CARE_PROVIDER_SITE_OTHER): Payer: Medicare Other | Admitting: Podiatry

## 2022-05-13 DIAGNOSIS — B351 Tinea unguium: Secondary | ICD-10-CM | POA: Diagnosis not present

## 2022-05-13 DIAGNOSIS — M79675 Pain in left toe(s): Secondary | ICD-10-CM

## 2022-05-13 DIAGNOSIS — M79674 Pain in right toe(s): Secondary | ICD-10-CM | POA: Diagnosis not present

## 2022-05-13 NOTE — Progress Notes (Signed)
Chief Complaint  Patient presents with   Ingrown Toenail    Patient is here for left foot ingrown toe nail that was infected and the patient saw pcp and was given abx that she has finished.Patient would like for the provider to evaluted both great toe nails.    SUBJECTIVE Patient presents to office today complaining of elongated, thickened nails that cause pain while ambulating in shoes.  Patient is unable to trim their own nails.  The most symptomatic toenails are the bilateral great toes that are very painful and tender.  Most recently the patient states that she developed an ingrown toenail to the lateral border of the left great toe.  She just recently completed some oral antibiotics that were prescribed by her PCP.  Past Medical History:  Diagnosis Date   (HFpEF) heart failure with preserved ejection fraction ([place])    a. 08/2020 PET: Not suggestive of transthyretin amyloidosis; b. 11/2020 Echo: 60-65%, gr1 DD.   Abnormal mammogram 2007   Recommend close follow up   Anemia    Iron, b12, SPEP, UPEP normal, 12/2012   Anxiety    Aortic insufficiency    a. 06/2020 cMRI: mild AI.   Arthritis    Asthma    CAD (coronary artery disease)    a. 1996 s/p PTCA; b. 06/2011 Cath: LCX 50, RCA 100 CTO; c. 03/2015 PCI: LAD 70, LCX 37mw/ abnl FFR (3.0x12 Synergy & 2.5x8 Synergy DESs); d. 01/2018 Cath: stable anatomy, patent LCX stents; e. 04/2020 Cath: LM nl, LAD 40p, 618mD2 70ost, LCX 40ost, patent stents, RCA 70p/m, 100d. RPAV fills via R->R collats. RPL2 fills via L->R collats-->Med Rx.   [place]id arterial disease ([place]   a. 05/2020 Carotid U/S: <50% bilat ICA stenoses.   Chronic cough    Depression    Diverticulosis    Dyspnea    Elevated LFTs 2010   s/p ultrasound w/ possible fatty liver; GI consult. Resolution 2011   Gastric ulcer    GERD (gastroesophageal reflux disease)    History of colonic polyps    [facility]    Hyperlipidemia    Hypertension     [facility]   Dr. ChOliva Bustard Dr. BrClaiborne Rigg/p chemo   PSVT (paroxysmal supraventricular tachycardia)    a. 06/2020 Zio: 52 episodes of SVT (longest 59 secs; fastest 159 bpm).   Severe aortic stenosis    a. 05/2020 Cor CTA: Tricuspid AoV w/ sev reduced cusp separation and Ca2+; b. 06/2020 cMRI: Sev AS w/ mild AI. Mod MR. EF 69% w/ sev asymmetric LVH. Nl RV fxn; c. 11/2020 Echo: EF 60-65%, no rwma, Gr1 DD. Nl RV size/fxn. Mild MR. Mod-Sev AS (appears severe visually) AVA 0.94cm^2.   Shingles 2008   T-9 distribution   Syncope    a. 06/2020 Zio: 7 beats WCT (NSVT). 52 episodes of SVT (longest 59 secs, fastest 159 bpm).   Vitamin D deficiency    Wheezing     OBJECTIVE General Patient is awake, alert, and oriented x 3 and in no acute distress. Derm Skin is dry and supple bilateral. Negative open lesions or macerations. Remaining integument unremarkable. Nails are tender, long, thickened and dystrophic with subungual debris, consistent with onychomycosis, bilateral great toenails.  No signs of infection noted.  Evidence of a slight ingrown toenail noted to the lateral border of the left great toe.  There is some mild erythema around the area Vasc  DP and  PT pedal pulses palpable bilaterally. Temperature gradient within normal limits.  Neuro Epicritic and protective threshold sensation grossly intact bilaterally.  Musculoskeletal Exam No symptomatic pedal deformities noted bilateral. Muscular strength within normal limits.  ASSESSMENT 1.  Pain due to onychomycosis of toenails both great toes 2.  Ingrown toenail lateral border left great toe  PLAN OF CARE 1. Patient evaluated today.  2. Instructed to maintain good pedal hygiene and foot care.  3. Mechanical debridement of nails to the great toes bilaterally performed using a nail nipper. Filed with dremel without incident.  4.  Mechanical debridement of the ingrowing portion of nail to the lateral border of the left  great toe was also performed and the patient did feel some relief.  Antibiotic ointment and a Band-Aid was applied. 5.  The patient did have a near syncopal episode after debridement of the toenails while sitting in the exam chair.  She rested in the room for about 20 minutes and felt much better.  She left with her friend who was driving her today.  Edrick Kins, DPM [facility]  Dr. Edrick Kins, DPM    [address], [place] [address]                Office [phone]  Fax [phone]

## 2022-05-18 ENCOUNTER — Other Ambulatory Visit: Payer: Self-pay | Admitting: Cardiovascular Disease

## 2022-05-22 ENCOUNTER — Other Ambulatory Visit: Payer: Self-pay | Admitting: Family Medicine

## 2022-05-29 ENCOUNTER — Ambulatory Visit: Payer: Medicare Other | Attending: Cardiovascular Disease | Admitting: Cardiovascular Disease

## 2022-05-29 ENCOUNTER — Encounter: Payer: Self-pay | Admitting: Cardiovascular Disease

## 2022-05-29 VITALS — BP 182/90 | HR 78 | Ht 59.0 in | Wt 116.5 lb

## 2022-05-29 DIAGNOSIS — I350 Nonrheumatic aortic (valve) stenosis: Secondary | ICD-10-CM | POA: Diagnosis not present

## 2022-05-29 DIAGNOSIS — I2510 Atherosclerotic heart disease of native coronary artery without angina pectoris: Secondary | ICD-10-CM | POA: Diagnosis not present

## 2022-05-29 DIAGNOSIS — I10 Essential (primary) hypertension: Secondary | ICD-10-CM | POA: Insufficient documentation

## 2022-05-29 DIAGNOSIS — E785 Hyperlipidemia, unspecified: Secondary | ICD-10-CM | POA: Insufficient documentation

## 2022-05-29 NOTE — Progress Notes (Signed)
Cardiology Office Note   Date:  05/29/2022   ID:  Amy Morton, DOB 05-16-1935, MRN [id number]  PCP:  [name], MD  Cardiologist:   Kathlyn Sacramento, MD   Chief Complaint  Patient presents with   Other    6 month f/u c/o episodes of dizziness, loss of breath and not feeling like herself. Meds reviewed verbally with pt.      History of Present Illness: Amy Morton is a 87 y.o. female who presents for for a follow-up visit regarding coronary artery disease and moderate to severe aortic stenosis. Other medical problems include anemia, hypertension, hyperlipidemia, anxiety and depression. She had previous left circumflex PCI in 2016 with known occluded distal right coronary artery.  She had worsening anginal symptoms in 2021.  Right and left cardiac catheterization was performed in October 2021 which showed low filling pressures, normal pulmonary pressure, normal cardiac output and severe aortic stenosis with mean gradient of 30 mmHg and valve area of 0.82 square.  Coronary angiography showed patent stents in the left circumflex without significant restenosis, stable 70% stenosis in the mid LAD in a tortuous segment and known chronically occluded right coronary artery with left-to-right collaterals.  She was hospitalized in November, 2021 with TIA.  The patient was seen by our TAVR team.  She underwent PYP scan which was not consistent with TTR amyloid.  It was felt that her aortic stenosis was moderate and there was concern about the patient's memory and continued functional decline.  She has dementia with gradual worsening of memory and functional capacity.  Her dementia is worse with associated anxiety.  The patient and her caregiver report intermittent episodes of dizziness and shortness of breath.  She does not feel herself.  She is tearful.  No chest pain.   Past Medical History:  Diagnosis Date   (HFpEF) heart failure with preserved ejection fraction ([place])     a. 08/2020 PET: Not suggestive of transthyretin amyloidosis; b. 11/2020 Echo: 60-65%, gr1 DD.   Abnormal mammogram 2007   Recommend close follow up   Anemia    Iron, b12, SPEP, UPEP normal, 12/2012   Anxiety    Aortic insufficiency    a. 06/2020 cMRI: mild AI.   Arthritis    Asthma    CAD (coronary artery disease)    a. 1996 s/p PTCA; b. 06/2011 Cath: LCX 50, RCA 100 CTO; c. 03/2015 PCI: LAD 70, LCX 46mw/ abnl FFR (3.0x12 Synergy & 2.5x8 Synergy DESs); d. 01/2018 Cath: stable anatomy, patent LCX stents; e. 04/2020 Cath: LM nl, LAD 40p, 623mD2 70ost, LCX 40ost, patent stents, RCA 70p/m, 100d. RPAV fills via R->R collats. RPL2 fills via L->R collats-->Med Rx.   Carotid arterial disease ([facility]   a. 05/2020 Carotid U/S: <50% bilat ICA stenoses.   Chronic cough    Depression    Diverticulosis    Dyspnea    Elevated LFTs 2010   s/p ultrasound w/ possible fatty liver; GI consult. Resolution 2011   Gastric ulcer    GERD (gastroesophageal reflux disease)    History of colonic polyps    [facility]    Hyperlipidemia    Hypertension    [facility]   Dr. ChOliva Bustard Dr. BrClaiborne Rigg/p chemo   PSVT (paroxysmal supraventricular tachycardia)    a. 06/2020 Zio: 52 episodes of SVT (longest 59 secs; fastest 159 bpm).   Severe aortic stenosis  a. 05/2020 Cor CTA: Tricuspid AoV w/ sev reduced cusp separation and Ca2+; b. 06/2020 cMRI: Sev AS w/ mild AI. Mod MR. EF 69% w/ sev asymmetric LVH. Nl RV fxn; c. 11/2020 Echo: EF 60-65%, no rwma, Gr1 DD. Nl RV size/fxn. Mild MR. Mod-Sev AS (appears severe visually) AVA 0.94cm^2.   Shingles 2008   T-9 distribution   Syncope    a. 06/2020 Zio: 7 beats WCT (NSVT). 52 episodes of SVT (longest 59 secs, fastest 159 bpm).   Vitamin D deficiency    Wheezing     Past Surgical History:  Procedure Laterality Date   ABDOMINAL HYSTERECTOMY  2001   APPENDECTOMY     CARDIAC CATHETERIZATION  2007   50% mid LAD  stenosis, 70% proximal RCA with 100% distal RCA stenosis with collaterals, EF 65%   CARDIAC CATHETERIZATION N/A 04/27/2015   Procedure: Left Heart Cath and Coronary Angiography;  Surgeon: Jettie Booze, MD;  Location: [facility];  Service: Cardiovascular;  Laterality: N/A;   CARDIAC CATHETERIZATION N/A 04/27/2015   Procedure: Coronary Stent Intervention;  Surgeon: Jettie Booze, MD;  Location: [facility];  Service: Cardiovascular;  Laterality: N/A;   CATARACT EXTRACTION W/PHACO Left 09/25/2016   Procedure: CATARACT EXTRACTION PHACO AND INTRAOCULAR LENS PLACEMENT (IOC);  Surgeon: Eulogio Bear, MD;  Location: [facility];  Service: Ophthalmology;  Laterality: Left;  [place] 46.7 AP% 8.1 CDE 3.80 Fluid pack lot # [id number]   CATARACT EXTRACTION W/PHACO Right 11/06/2016   Procedure: CATARACT EXTRACTION PHACO AND INTRAOCULAR LENS PLACEMENT (IOC);  Surgeon: Eulogio Bear, MD;  Location: [facility];  Service: Ophthalmology;  Laterality: Right;  [place] 49.5 AP8.7 CDE4.28 LOT # [id number]   CHOLECYSTECTOMY  1987   COLECTOMY  2001   CORONARY ANGIOPLASTY  1996   CORONARY ANGIOPLASTY     LAPAROSCOPIC BILATERAL SALPINGO OOPHERECTOMY  1988   Dr. Emelle CATH AND CORONARY ANGIOGRAPHY N/A 01/25/2018   Procedure: LEFT HEART CATH AND CORONARY ANGIOGRAPHY;  Surgeon: Wellington Hampshire, MD;  Location: [facility];  Service: Cardiovascular;  Laterality: N/A;   RIGHT/LEFT HEART CATH AND CORONARY ANGIOGRAPHY N/A 05/16/2020   Procedure: RIGHT/LEFT HEART CATH AND CORONARY ANGIOGRAPHY;  Surgeon: Wellington Hampshire, MD;  Location: [facility];  Service: Cardiovascular;  Laterality: N/A;   TONSILLECTOMY  1950     Current Outpatient Medications  Medication Sig Dispense Refill   acetaminophen (TYLENOL) 500 MG tablet Take 1,000 mg by mouth in the morning and at bedtime.     aspirin 81 MG tablet Take 81 mg by mouth daily.     atorvastatin (LIPITOR) 40 MG tablet TAKE 1 TABLET BY  MOUTH EVERY DAY 90 tablet 3   Calcium Carbonate-Vit D-Min (CALCIUM 1200 PO) Take 1,200 mg by mouth daily.     carvedilol (COREG) 6.25 MG tablet TAKE 1 TABLET(6.25 MG) BY MOUTH TWICE DAILY WITH A MEAL 60 tablet 5   Cholecalciferol 5000 units TABS Take 5,000 Units by mouth daily.     clonazePAM (KLONOPIN) 0.5 MG tablet Take 0.5 tablets (0.25 mg total) by mouth daily as needed for anxiety. 5 tablet 0   Cyanocobalamin (B-12 PO) Take by mouth daily.     isosorbide mononitrate (IMDUR) 30 MG 24 hr tablet TAKE 1 TABLET BY MOUTH DAILY 90 tablet 0   magnesium oxide (MAG-OX) 400 MG tablet Take 400 mg by mouth in the morning, at noon, and at bedtime.     melatonin 3 MG  TABS tablet Take by mouth.     mirtazapine (REMERON) 15 MG tablet Take 2 tablets (30 mg total) by mouth at bedtime. 180 tablet 1   Multiple Vitamin (MULTIVITAMIN) capsule Take 1 capsule by mouth daily.     nitroGLYCERIN (NITROSTAT) 0.4 MG SL tablet PLACE 1 TABLET UNDER THE TONGUE EVERY 5 MINUTES AS NEEDED FOR CHEST PAIN, 3 DOSES MAX 25 tablet 1   Nutritional Supplements (OSTEO ADVANCE) TABS Take 1 tablet by mouth daily.      pantoprazole (PROTONIX) 40 MG tablet TAKE 1 TABLET(40 MG) BY MOUTH DAILY 90 tablet 0   potassium chloride SA (KLOR-CON M) 20 MEQ tablet TAKE 1 TABLET(20 MEQ) BY MOUTH DAILY 90 tablet 3   traZODone (DESYREL) 50 MG tablet Take 1 tablet (50 mg total) by mouth daily in the afternoon AND 1 tablet (50 mg total) at bedtime. 180 tablet 3   Ubiquinol 100 MG CAPS Take 100 mg by mouth every morning.     No current facility-administered medications for this visit.   Facility-Administered Medications Ordered in Other Visits  Medication Dose Route Frequency Provider Last Rate Last Admin   sodium chloride 0.9 % nebulizer solution 3 mL  3 mL Nebulization Once [name], MD       Followed by   methacholine (PROVOCHOLINE) inhaler solution 0.125 mg  2 mL Inhalation Once [name], MD       Followed by   methacholine  (PROVOCHOLINE) inhaler solution 0.5 mg  2 mL Inhalation Once [name], MD       Followed by   methacholine (PROVOCHOLINE) inhaler solution 2 mg  2 mL Inhalation Once [name], MD       Followed by   methacholine (PROVOCHOLINE) inhaler solution 8 mg  2 mL Inhalation Once [name], MD       Followed by   methacholine (PROVOCHOLINE) inhaler solution 32 mg  2 mL Inhalation Once [name], MD        Allergies:   Abilify [aripiprazole], Celexa [citalopram], and Ibuprofen    Social History:  The patient  reports that she has never smoked. She has never used smokeless tobacco. She reports that she does not drink alcohol and does not use drugs.   Family History:  The patient's family history includes Cancer in her father; Cancer (age of onset: 31) in her daughter; Diabetes in her brother and sister; Heart disease in her brother; Hypertension in her mother; Stroke (age of onset: 23) in her mother.    ROS:  Please see the history of present illness.   Otherwise, review of systems are positive for none.   All other systems are reviewed and negative.    PHYSICAL EXAM: VS:  BP (!) 182/90 (BP Location: Left Arm, Patient Position: Sitting, Cuff Size: Normal)   Pulse 78   Ht '4\' 11"'$  (1.499 m)   Wt 116 lb 8 oz (52.8 kg)   SpO2 98%   BMI 23.53 kg/m  , BMI Body mass index is 23.53 kg/m. GEN: Well nourished, well developed, in no acute distress  HEENT: normal  Neck: no JVD, carotid bruits, or masses Cardiac: RRR; no rubs, or gallops,no edema .  3/6 crescendo decrescendo systolic murmur in the aortic area which is mid to late peaking. Respiratory:  clear to auscultation bilaterally, normal work of breathing GI: soft, nontender, nondistended, + BS MS: no deformity or atrophy  Skin: warm and dry, no rash Neuro:  Strength and sensation  are intact Psych: euthymic mood, full affect   EKG:  EKG is ordered today. EKG showed normal sinus rhythm with LVH and left axis  deviation.  LVH with repolarization abnormalities.   Recent Labs: 09/03/2021: ALT 14; Hemoglobin 12.0; Platelets 158.0 12/24/2021: BUN 13; Creatinine, Ser 0.80; Potassium 4.0; Sodium 133 01/14/2022: TSH 2.030    Lipid Panel    Component Value Date/Time   CHOL 116 09/03/2021 1222   CHOL 115 09/03/2017 1049   TRIG 159.0 (H) 09/03/2021 1222   HDL 50.80 09/03/2021 1222   HDL 50 09/03/2017 1049   CHOLHDL 2 09/03/2021 1222   VLDL 31.8 09/03/2021 1222   LDLCALC 33 09/03/2021 1222   LDLCALC 29 09/03/2017 1049   LDLDIRECT 35.0 02/21/2019 1012      Wt Readings from Last 3 Encounters:  05/29/22 116 lb 8 oz (52.8 kg)  05/02/22 116 lb 3.2 oz (52.7 kg)  03/04/22 115 lb 6.4 oz (52.3 kg)           No data to display            ASSESSMENT AND PLAN:  1.  Coronary artery disease involving native coronary arteries without angina: Most recent cardiac catheterization in October of 2021 showed stable coronary findings with chronically occluded RCA with left-to-right collaterals and patent left circumflex stents.  Recommend continuing medical therapy.    2.  Severe aortic stenosis: She is reporting worsening symptoms of dizziness and shortness of breath which I suspect is likely due to her underlying aortic stenosis.  I requested a follow-up echocardiogram.  She is not a candidate for TAVR given her age and dementia.  However, if there is significant worsening compared to before, the plan is to refer her to hospice.  The patient is in agreement that she wants to avoid hospitalization.  3.  Essential hypertension: Blood pressure is elevated but will not add additional antihypertensive medications at this time given underlying aortic stenosis.  4.  Hyperlipidemia: Continue atorvastatin 40 mg once daily.  Most recent lipid profile showed an LDL of 33.    Disposition:   Obtain an echocardiogram.  Follow-up in 3 months. Signed,  Kathlyn Sacramento, MD  05/29/2022 11:25 AM    [place]

## 2022-05-29 NOTE — Patient Instructions (Signed)
Medication Instructions:  Your physician recommends that you continue on your current medications as directed. Please refer to the Current Medication list given to you today.  *If you need a refill on your cardiac medications before your next appointment, please call your pharmacy*   Lab Work: None ordered If you have labs (blood work) drawn today and your tests are completely normal, you will receive your results only by: [place] (if you have MyChart) OR A paper copy in the mail If you have any lab test that is abnormal or we need to change your treatment, we will call you to review the results.   Testing/Procedures: Your physician has requested that you have an echocardiogram. Echocardiography is a painless test that uses sound waves to create images of your heart. It provides your doctor with information about the size and shape of your heart and how well your heart's chambers and valves are working. This procedure takes approximately one hour. There are no restrictions for this procedure. Please do NOT wear cologne, perfume, aftershave, or lotions (deodorant is allowed). Please arrive 15 minutes prior to your appointment time.    Follow-Up: At [facility], you and your health needs are our priority.  As part of our continuing mission to provide you with exceptional heart care, we have created designated Provider Care Teams.  These Care Teams include your primary Cardiologist (physician) and Advanced Practice Providers (APPs -  Physician Assistants and Nurse Practitioners) who all work together to provide you with the care you need, when you need it.   Your next appointment:   3 month(s)  The format for your next appointment:   In Person  Provider:   Kathlyn Sacramento, MD   Important Information About Sugar

## 2022-06-11 ENCOUNTER — Ambulatory Visit: Payer: Medicare Other | Attending: Cardiovascular Disease

## 2022-06-11 DIAGNOSIS — J449 Chronic obstructive pulmonary disease, unspecified: Secondary | ICD-10-CM | POA: Insufficient documentation

## 2022-06-11 DIAGNOSIS — E785 Hyperlipidemia, unspecified: Secondary | ICD-10-CM | POA: Diagnosis not present

## 2022-06-11 DIAGNOSIS — F0390 Unspecified dementia without behavioral disturbance: Secondary | ICD-10-CM | POA: Insufficient documentation

## 2022-06-11 DIAGNOSIS — I350 Nonrheumatic aortic (valve) stenosis: Secondary | ICD-10-CM | POA: Diagnosis not present

## 2022-06-11 DIAGNOSIS — I080 Rheumatic disorders of both mitral and aortic valves: Secondary | ICD-10-CM | POA: Insufficient documentation

## 2022-06-11 DIAGNOSIS — I2510 Atherosclerotic heart disease of native coronary artery without angina pectoris: Secondary | ICD-10-CM | POA: Diagnosis not present

## 2022-06-11 DIAGNOSIS — I10 Essential (primary) hypertension: Secondary | ICD-10-CM | POA: Diagnosis not present

## 2022-06-11 LAB — ECHOCARDIOGRAM COMPLETE
AR max vel: 0.98 cm2
AV Area VTI: 1.07 cm2
AV Area mean vel: 0.97 cm2
AV Mean grad: 25.7 mmHg
AV Peak grad: 44.6 mmHg
Ao pk vel: 3.34 m/s
Area-P 1/2: 3.42 cm2
Calc EF: 61.2 %
MV M vel: 6.41 m/s
MV Peak grad: 164.4 mmHg
P 1/2 time: 1013 msec
S' Lateral: 1.8 cm
Single Plane A2C EF: 42.5 %
Single Plane A4C EF: 69.8 %

## 2022-07-17 ENCOUNTER — Other Ambulatory Visit: Payer: Self-pay | Admitting: Family

## 2022-07-17 DIAGNOSIS — E782 Mixed hyperlipidemia: Secondary | ICD-10-CM

## 2022-07-17 NOTE — Telephone Encounter (Signed)
Rx(s) sent to pharmacy electronically.  

## 2022-07-26 ENCOUNTER — Other Ambulatory Visit: Payer: Self-pay | Admitting: Family Medicine

## 2022-07-26 ENCOUNTER — Other Ambulatory Visit: Payer: Self-pay | Admitting: Cardiovascular Disease

## 2022-07-26 DIAGNOSIS — I10 Essential (primary) hypertension: Secondary | ICD-10-CM

## 2022-08-04 ENCOUNTER — Encounter: Payer: Self-pay | Admitting: Family Medicine

## 2022-08-04 ENCOUNTER — Ambulatory Visit (INDEPENDENT_AMBULATORY_CARE_PROVIDER_SITE_OTHER): Payer: Medicare Other | Admitting: Family Medicine

## 2022-08-04 ENCOUNTER — Ambulatory Visit (INDEPENDENT_AMBULATORY_CARE_PROVIDER_SITE_OTHER): Payer: Medicare Other

## 2022-08-04 VITALS — BP 132/72 | HR 84 | Resp 15 | Ht 59.0 in | Wt 117.0 lb

## 2022-08-04 VITALS — BP 132/72 | HR 84 | Temp 98.7°F | Ht 59.0 in | Wt 117.0 lb

## 2022-08-04 DIAGNOSIS — I10 Essential (primary) hypertension: Secondary | ICD-10-CM

## 2022-08-04 DIAGNOSIS — F03B0 Unspecified dementia, moderate, without behavioral disturbance, psychotic disturbance, mood disturbance, and anxiety: Secondary | ICD-10-CM | POA: Diagnosis not present

## 2022-08-04 DIAGNOSIS — F419 Anxiety disorder, unspecified: Secondary | ICD-10-CM | POA: Diagnosis not present

## 2022-08-04 DIAGNOSIS — Z0000 Encounter for general adult medical examination without abnormal findings: Secondary | ICD-10-CM

## 2022-08-04 DIAGNOSIS — F32A Depression, unspecified: Secondary | ICD-10-CM | POA: Diagnosis not present

## 2022-08-04 NOTE — Assessment & Plan Note (Signed)
Adequately controlled.  She will continue Imdur 30 mg daily and carvedilol 6.25 mg twice daily.

## 2022-08-04 NOTE — Progress Notes (Signed)
[name], MD Phone: [phone]  Amy Morton is a 88 y.o. female who presents today for follow-up.  Anxiety/depression: Patient notes no anxiety or depression.  She is not required the clonazepam.  She continues on trazodone and Remeron.  She notes no drowsiness with his medications.  No SI.  Hypertension: She has not been checking this at home as it tends to cause anxiety when she does check it.  She is taking Imdur and carvedilol.  No chest pain or shortness of breath.  Dementia: Patient reports memory continues to get worse.  She did not tolerate medication specifically for this in the past.  Social History   Tobacco Use  Smoking Status Never  Smokeless Tobacco Never    Current Outpatient Medications on File Prior to Visit  Medication Sig Dispense Refill   acetaminophen (TYLENOL) 500 MG tablet Take 1,000 mg by mouth in the morning and at bedtime.     aspirin 81 MG tablet Take 81 mg by mouth daily.     atorvastatin (LIPITOR) 40 MG tablet TAKE 1 TABLET BY MOUTH EVERY DAY 90 tablet 3   Calcium Carbonate-Vit D-Min (CALCIUM 1200 PO) Take 1,200 mg by mouth daily.     carvedilol (COREG) 6.25 MG tablet TAKE 1 TABLET(6.25 MG) BY MOUTH TWICE DAILY WITH A MEAL 60 tablet 5   Cholecalciferol 5000 units TABS Take 5,000 Units by mouth daily.     clonazePAM (KLONOPIN) 0.5 MG tablet Take 0.5 tablets (0.25 mg total) by mouth daily as needed for anxiety. 5 tablet 0   Cyanocobalamin (B-12 PO) Take by mouth daily.     isosorbide mononitrate (IMDUR) 30 MG 24 hr tablet TAKE 1 TABLET BY MOUTH DAILY 90 tablet 3   magnesium oxide (MAG-OX) 400 MG tablet Take 400 mg by mouth in the morning, at noon, and at bedtime.     melatonin 3 MG TABS tablet Take by mouth.     mirtazapine (REMERON) 15 MG tablet Take 2 tablets (30 mg total) by mouth at bedtime. 180 tablet 1   Multiple Vitamin (MULTIVITAMIN) capsule Take 1 capsule by mouth daily.     nitroGLYCERIN (NITROSTAT) 0.4 MG SL tablet PLACE 1  TABLET UNDER THE TONGUE EVERY 5 MINUTES AS NEEDED FOR CHEST PAIN, 3 DOSES MAX 25 tablet 1   Nutritional Supplements (OSTEO ADVANCE) TABS Take 1 tablet by mouth daily.      pantoprazole (PROTONIX) 40 MG tablet TAKE 1 TABLET(40 MG) BY MOUTH DAILY 90 tablet 0   potassium chloride SA (KLOR-CON M) 20 MEQ tablet TAKE 1 TABLET(20 MEQ) BY MOUTH DAILY 90 tablet 3   traZODone (DESYREL) 50 MG tablet Take 1 tablet (50 mg total) by mouth daily in the afternoon AND 1 tablet (50 mg total) at bedtime. 180 tablet 3   Ubiquinol 100 MG CAPS Take 100 mg by mouth every morning.     Current Facility-Administered Medications on File Prior to Visit  Medication Dose Route Frequency Provider Last Rate Last Admin   sodium chloride 0.9 % nebulizer solution 3 mL  3 mL Nebulization Once [name], MD       Followed by   methacholine (PROVOCHOLINE) inhaler solution 0.125 mg  2 mL Inhalation Once [name], MD       Followed by   methacholine (PROVOCHOLINE) inhaler solution 0.5 mg  2 mL Inhalation Once [name], MD       Followed by   methacholine (PROVOCHOLINE) inhaler solution 2 mg  2 mL Inhalation  Once [name], MD       Followed by   methacholine (PROVOCHOLINE) inhaler solution 8 mg  2 mL Inhalation Once [name], MD       Followed by   methacholine (PROVOCHOLINE) inhaler solution 32 mg  2 mL Inhalation Once [name], MD         ROS see history of present illness  Objective  Physical Exam Vitals:   08/04/22 1049  BP: 132/72  Pulse: 84  Temp: 98.7 F (37.1 C)  SpO2: 97%    BP Readings from Last 3 Encounters:  08/04/22 132/72  08/04/22 132/72  05/29/22 (!) 182/90   Wt Readings from Last 3 Encounters:  08/04/22 117 lb (53.1 kg)  08/04/22 117 lb (53.1 kg)  05/29/22 116 lb 8 oz (52.8 kg)    Physical Exam Constitutional:      General: She is not in acute distress.    Appearance: She is not diaphoretic.  Cardiovascular:     Rate and Rhythm:  Normal rate and regular rhythm.     Heart sounds: Murmur (3/6 systolic murmur right upper sternal border) heard.  Pulmonary:     Effort: Pulmonary effort is normal.     Breath sounds: Normal breath sounds.  Skin:    General: Skin is warm and dry.  Neurological:     Mental Status: She is alert.      Assessment/Plan: Please see individual problem list.  Essential hypertension Assessment & Plan: Adequately controlled.  She will continue Imdur 30 mg daily and carvedilol 6.25 mg twice daily.   Moderate dementia without behavioral disturbance, psychotic disturbance, mood disturbance, or anxiety, unspecified dementia type [facility]) Assessment & Plan: Chronic issue.  Progressively worsening.  Discussed that it will continue to worsen though we will continue to monitor.   Anxiety and depression Assessment & Plan: Currently asymptomatic.  She will continue Remeron 30 mg daily and trazodone 50 mg in the afternoon and 50 mg at bedtime.  She does have clonazepam on hand that she can use if she has significant anxiety.     Return in about 3 months (around 11/03/2022).   [name], MD [place]

## 2022-08-04 NOTE — Assessment & Plan Note (Signed)
Currently asymptomatic.  She will continue Remeron 30 mg daily and trazodone 50 mg in the afternoon and 50 mg at bedtime.  She does have clonazepam on hand that she can use if she has significant anxiety.

## 2022-08-04 NOTE — Progress Notes (Signed)
Subjective:   Amy Morton is a 88 y.o. female who presents for Medicare Annual (Subsequent) preventive examination.  Review of Systems    No ROS.  Medicare Wellness   Cardiac Risk Factors include: advanced age (>53mn, >>2women);hypertension     Objective:    Today's Vitals   08/04/22 0956  BP: 132/72  Pulse: 84  Resp: 15  SpO2: 97%  Weight: 117 lb (53.1 kg)  Height: '4\' 11"'$  (1.499 m)   Body mass index is 23.63 kg/m.     08/04/2022   10:05 AM 01/09/2022    1:05 PM 06/10/2021    4:19 PM 06/02/2021    1:05 PM 12/05/2020    9:27 AM 09/11/2020   10:22 PM 08/18/2020    3:05 PM  Advanced Directives  Does Patient Have a Medical Advance Directive? Yes Yes Yes Yes Unable to assess, patient is non-responsive or altered mental status No Yes  Type of Advance Directive [place]     Does patient want to make changes to medical advance directive? No - Patient declined  No - Patient declined No - Patient declined     Copy of [place] Chart? Yes - validated most recent copy scanned in chart (See row information)  No - copy requested      Would patient like information on creating a medical advance directive?     No - Patient declined No - Patient declined     Current Medications (verified) Outpatient Encounter Medications as of 08/04/2022  Medication Sig   acetaminophen (TYLENOL) 500 MG tablet Take 1,000 mg by mouth in the morning and at bedtime.   aspirin 81 MG tablet Take 81 mg by mouth daily.   atorvastatin (LIPITOR) 40 MG tablet TAKE 1 TABLET BY MOUTH EVERY DAY   Calcium Carbonate-Vit D-Min (CALCIUM 1200 PO) Take 1,200 mg by mouth daily.   carvedilol (COREG) 6.25 MG tablet TAKE 1 TABLET(6.25 MG) BY MOUTH TWICE DAILY WITH A MEAL   Cholecalciferol 5000 units TABS Take 5,000 Units by mouth daily.   clonazePAM (KLONOPIN) 0.5 MG tablet Take 0.5  tablets (0.25 mg total) by mouth daily as needed for anxiety.   Cyanocobalamin (B-12 PO) Take by mouth daily.   isosorbide mononitrate (IMDUR) 30 MG 24 hr tablet TAKE 1 TABLET BY MOUTH DAILY   magnesium oxide (MAG-OX) 400 MG tablet Take 400 mg by mouth in the morning, at noon, and at bedtime.   melatonin 3 MG TABS tablet Take by mouth.   mirtazapine (REMERON) 15 MG tablet Take 2 tablets (30 mg total) by mouth at bedtime.   Multiple Vitamin (MULTIVITAMIN) capsule Take 1 capsule by mouth daily.   nitroGLYCERIN (NITROSTAT) 0.4 MG SL tablet PLACE 1 TABLET UNDER THE TONGUE EVERY 5 MINUTES AS NEEDED FOR CHEST PAIN, 3 DOSES MAX   Nutritional Supplements (OSTEO ADVANCE) TABS Take 1 tablet by mouth daily.    pantoprazole (PROTONIX) 40 MG tablet TAKE 1 TABLET(40 MG) BY MOUTH DAILY   potassium chloride SA (KLOR-CON M) 20 MEQ tablet TAKE 1 TABLET(20 MEQ) BY MOUTH DAILY   traZODone (DESYREL) 50 MG tablet Take 1 tablet (50 mg total) by mouth daily in the afternoon AND 1 tablet (50 mg total) at bedtime.   Ubiquinol 100 MG CAPS Take 100 mg by mouth every morning.   Facility-Administered Encounter Medications as of 08/04/2022  Medication   sodium chloride 0.9 %  nebulizer solution 3 mL   Followed by   methacholine (PROVOCHOLINE) inhaler solution 0.125 mg   Followed by   methacholine (PROVOCHOLINE) inhaler solution 0.5 mg   Followed by   methacholine (PROVOCHOLINE) inhaler solution 2 mg   Followed by   methacholine (PROVOCHOLINE) inhaler solution 8 mg   Followed by   methacholine (PROVOCHOLINE) inhaler solution 32 mg    Allergies (verified) Abilify [aripiprazole], Celexa [citalopram], and Ibuprofen   History: Past Medical History:  Diagnosis Date   (HFpEF) heart failure with preserved ejection fraction ([place])    a. 08/2020 PET: Not suggestive of transthyretin amyloidosis; b. 11/2020 Echo: 60-65%, gr1 DD.   Abnormal mammogram 2007   Recommend close follow up   Anemia    Iron, b12, SPEP, UPEP normal,  12/2012   Anxiety    Aortic insufficiency    a. 06/2020 cMRI: mild AI.   Arthritis    Asthma    CAD (coronary artery disease)    a. 1996 s/p PTCA; b. 06/2011 Cath: LCX 50, RCA 100 CTO; c. 03/2015 PCI: LAD 70, LCX 54mw/ abnl FFR (3.0x12 Synergy & 2.5x8 Synergy DESs); d. 01/2018 Cath: stable anatomy, patent LCX stents; e. 04/2020 Cath: LM nl, LAD 40p, 621mD2 70ost, LCX 40ost, patent stents, RCA 70p/m, 100d. RPAV fills via R->R collats. RPL2 fills via L->R collats-->Med Rx.   Carotid arterial disease ([place]   a. 05/2020 Carotid U/S: <50% bilat ICA stenoses.   Chronic cough    Depression    Diverticulosis    Dyspnea    Elevated LFTs 2010   s/p ultrasound w/ possible fatty liver; GI consult. Resolution 2011   Gastric ulcer    GERD (gastroesophageal reflux disease)    History of colonic polyps    [facility]    Hyperlipidemia    Hypertension    [facility]   Dr. ChOliva Bustard Dr. BrClaiborne Rigg/p chemo   PSVT (paroxysmal supraventricular tachycardia)    a. 06/2020 Zio: 52 episodes of SVT (longest 59 secs; fastest 159 bpm).   Severe aortic stenosis    a. 05/2020 Cor CTA: Tricuspid AoV w/ sev reduced cusp separation and Ca2+; b. 06/2020 cMRI: Sev AS w/ mild AI. Mod MR. EF 69% w/ sev asymmetric LVH. Nl RV fxn; c. 11/2020 Echo: EF 60-65%, no rwma, Gr1 DD. Nl RV size/fxn. Mild MR. Mod-Sev AS (appears severe visually) AVA 0.94cm^2.   Shingles 2008   T-9 distribution   Syncope    a. 06/2020 Zio: 7 beats WCT (NSVT). 52 episodes of SVT (longest 59 secs, fastest 159 bpm).   Vitamin D deficiency    Wheezing    Past Surgical History:  Procedure Laterality Date   ABDOMINAL HYSTERECTOMY  2001   APPENDECTOMY     CARDIAC CATHETERIZATION  2007   50% mid LAD stenosis, 70% proximal RCA with 100% distal RCA stenosis with collaterals, EF 65%   CARDIAC CATHETERIZATION N/A 04/27/2015   Procedure: Left Heart Cath and Coronary Angiography;  Surgeon: JaJettie BoozeMD;  [facility];  Service: Cardiovascular;  Laterality: N/A;   CARDIAC CATHETERIZATION N/A 04/27/2015   Procedure: Coronary Stent Intervention;  Surgeon: JaJettie BoozeMD;  [facility];  Service: Cardiovascular;  Laterality: N/A;   CATARACT EXTRACTION W/PHACO Left 09/25/2016   Procedure: CATARACT EXTRACTION PHACO AND INTRAOCULAR LENS PLACEMENT (IOC);  Surgeon: BrEulogio BearMD;  Location: [facility] ORS;  Service: Ophthalmology;  Laterality: Left;  [place] 46.7 AP% 8.1 CDE 3.80 Fluid pack lot # [id number]   CATARACT EXTRACTION W/PHACO Right 11/06/2016   Procedure: CATARACT EXTRACTION PHACO AND INTRAOCULAR LENS PLACEMENT (IOC);  Surgeon: Eulogio Bear, MD;  Location: [facility] ORS;  Service: Ophthalmology;  Laterality: Right;  [place] 49.5 AP8.7 CDE4.28 LOT # [id number]   CHOLECYSTECTOMY  1987   COLECTOMY  2001   CORONARY ANGIOPLASTY  1996   CORONARY ANGIOPLASTY     LAPAROSCOPIC BILATERAL SALPINGO OOPHERECTOMY  1988   Dr. Ripley CATH AND CORONARY ANGIOGRAPHY N/A 01/25/2018   Procedure: LEFT HEART CATH AND CORONARY ANGIOGRAPHY;  Surgeon: Wellington Hampshire, MD;  Location: [place] [facility];  Service: Cardiovascular;  Laterality: N/A;   RIGHT/LEFT HEART CATH AND CORONARY ANGIOGRAPHY N/A 05/16/2020   Procedure: RIGHT/LEFT HEART CATH AND CORONARY ANGIOGRAPHY;  Surgeon: Wellington Hampshire, MD;  Location: [place] [facility];  Service: Cardiovascular;  Laterality: N/A;   TONSILLECTOMY  1950   Family History  Problem Relation Age of Onset   Hypertension Mother    Stroke Mother 34       Cerebral Hemorrhage   Cancer Father        bone cancer   Diabetes Sister    Diabetes Brother    Heart disease Brother    Cancer Daughter 47       Ovarian cancer   Colon cancer Neg Hx    Colon polyps Neg Hx    Social History   Socioeconomic History   Marital status: Widowed    Spouse name: Not on file   Number of children: 3   Years of education: 60    Highest education level: Not on file  Occupational History   Occupation: [employer]    Comment: Retired   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 She is widowed and has a daughter [name]), son [name]). She also had a son that was killed in a work related accident [name] - died age).       Caffeine - none   Exercise - walking   Right handed   Some caffeine   One story home   Social Determinants of Health   Financial Resource Strain: Low Risk  (08/04/2022)   Overall Financial Resource Strain (CARDIA)    Difficulty of Paying Living Expenses: Not hard at all  Food Insecurity: No Food Insecurity (08/04/2022)   Hunger Vital Sign    Worried About Running Out of Food in the Last Year: Never true    Ran Out of Food in the Last Year: Never true  Transportation Needs: No Transportation Needs (08/04/2022)   PRAPARE - Hydrologist (Medical): No    Lack of Transportation (Non-Medical): No  Physical Activity: Unknown (08/04/2022)   Exercise Vital Sign    Days of Exercise per Week: 0 days    Minutes of Exercise per Session: Not on file  Stress: No Stress Concern Present (08/04/2022)   [place]  Feeling of Stress : Not at all  Social Connections: Unknown (08/04/2022)   Social Connection and Isolation Panel [NHANES]    Frequency of Communication with Friends and Family: More than three times a week    Frequency of Social Gatherings with Friends and Family: More than three times a week    Attends Religious Services: Not on Occupational hygienist or Organizations: Not on file    Attends Archivist Meetings: Not on file    Marital Status: Not on file    Tobacco Counseling Counseling given: Not Answered   Clinical Intake:  Pre-visit preparation completed: Yes        Diabetes: No  How often do you need to have someone help you when you read instructions, pamphlets, or other written materials from your doctor or pharmacy?: 2 - Rarely    Interpreter Needed?: No      Activities of Daily Living    08/04/2022   10:07 AM  In your present state of health, do you have any difficulty performing the following activities:  Hearing? 0  Vision? 0  Difficulty concentrating or making decisions? 0  Walking or climbing stairs? 0  Dressing or bathing? 0  Doing errands, shopping? 1  Comment Family friend [place] and eating ? N  Using the Toilet? N  In the past six months, have you accidently leaked urine? N  Do you have problems with loss of bowel control? N  Managing your Medications? Y  Comment Family friend assist. Pill box in use  Managing your Finances? N  Comment Family friend or son assist as needed  Housekeeping or managing your Housekeeping? N    Patient Care Team: [name], MD as PCP - General (Family Medicine) Wellington Hampshire, MD as PCP - Cardiology (Cardiology) Sherryl Barters, NP as Nurse Practitioner (Nurse Practitioner)  Indicate any recent Medical Services you may have received from other than [facility] providers in the past year (date may be approximate).     Assessment:   This is a routine wellness examination for [place]. Family friend also assists with information. HIPAA compliant.   Hearing/Vision screen Hearing Screening - Comments:: Patient is able to hear conversational tones without difficulty.  No issues reported.   Vision Screening - Comments:: Followed by [facility] Wears corrective lenses when reading Cataract extraction, bilateral They  have regular follow up with the ophthalmologist    Dietary issues and exercise activities discussed: Current Exercise Habits: The patient does not participate in regular exercise at present Regular diet Good water intake   Goals Addressed             This Visit's Progress    Follow up with Primary Care Provider       As needed.       Depression Screen    08/04/2022    9:59 AM 03/04/2022   12:14 PM 06/10/2021    3:58 PM 05/13/2021    3:05 PM 04/08/2021    1:13 PM 03/25/2021   10:03 AM 11/22/2020   11:06 AM  PHQ 2/9 Scores  PHQ - 2 Score 4 0 0  6  0  PHQ- 9 Score 8    18       Information is confidential and restricted. Go to Review Flowsheets to unlock data.    Fall Risk    08/04/2022   10:06 AM 03/04/2022   12:14 PM 01/09/2022    1:05  PM 04/08/2021    1:13 PM 11/22/2020   11:06 AM  Fall Risk   Falls in the past year? 0 0 0 1 0  Number falls in past yr: 0 0 0 1 0  Injury with Fall? 0 0 0 1   Risk for fall due to :  No Fall Risks     Follow up Falls evaluation completed;Falls prevention discussed Falls evaluation completed  Falls evaluation completed Falls evaluation completed    FALL RISK PREVENTION PERTAINING TO THE HOME: Home free of loose throw rugs in walkways, pet beds, electrical cords, etc? Yes  Adequate lighting in your home to reduce risk of falls? Yes   ASSISTIVE DEVICES UTILIZED TO PREVENT FALLS: Life alert? No  Use of a cane, walker or w/c? No   TIMED UP AND GO: Was the test performed? Yes .  Length of time to ambulate 10 feet: 10 sec.   Gait steady and fast without use of assistive device  Cognitive Function:    06/10/2021    4:27 PM 04/24/2020   11:57 AM 03/05/2018    1:31 PM 03/04/2017    1:37 PM 03/04/2016    1:41 PM  MMSE - Mini Mental State Exam  Not completed: Unable to complete      Orientation to time  '4 5 5 5  '$ Orientation to Place  '5 5 5 5  '$ Registration  '3 3 3 3  '$ Attention/ Calculation  '4 5 5 5  '$ Recall  '3 1 3 2  '$ Recall-comments   1 out  of 3 words recalled    Language- name 2 objects  '2 2 2 2  '$ Language- repeat  '1 1 1 1  '$ Language- follow 3 step command  '3 3 3 3  '$ Language- read & follow direction  '1 1 1 1  '$ Write a sentence  '1 1 1 1  '$ Copy design  '1 1 1 1  '$ Total score  '28 28 30 29      '$ 01/10/2022    8:00 AM  Montreal Cognitive Assessment   Visuospatial/ Executive (0/5) 2  Naming (0/3) 3  Attention: Read list of digits (0/2) 2  Attention: Read list of letters (0/1) 1  Attention: Serial 7 subtraction starting at 100 (0/3) 3  Language: Repeat phrase (0/2) 2  Language : Fluency (0/1) 0  Abstraction (0/2) 1  Delayed Recall (0/5) 0  Orientation (0/6) 5  Total 19  Adjusted Score (based on education) 19      08/04/2022   10:24 AM 03/08/2019    9:24 AM  6CIT Screen  What Year? 0 points 0 points  What month? 0 points 0 points  What time? 0 points 0 points  Count back from 20 0 points 0 points  Months in reverse 4 points 0 points  Repeat phrase 10 points 0 points  Total Score 14 points 0 points    Immunizations Immunization History  Administered Date(s) Administered   DTaP 06/23/2011   Fluad Quad(high Dose 65+) 04/24/2020, 04/24/2021, 05/02/2022   Influenza, High Dose Seasonal PF 04/07/2017, 04/07/2019, 05/27/2021   Influenza,inj,Quad PF,6+ Mos 04/17/2014, 05/07/2015, 05/07/2018   Influenza-Unspecified 04/13/2012, 04/08/2013, 04/17/2014, 05/07/2015, 05/02/2016   PFIZER(Purple Top)SARS-COV-2 Vaccination 08/29/2019, 09/19/2019, 04/24/2020   Pfizer Covid-19 Vaccine Bivalent Booster 17yr & up 05/27/2021   Pneumococcal Conjugate-13 03/04/2016   Pneumococcal Polysaccharide-23 11/20/2005, 11/21/2010   Zoster Recombinat (Shingrix) 05/08/2022, 07/31/2022   Zoster, Live 01/21/2011    TDAP status: Due, Education has been provided regarding the importance of  this vaccine. Advised may receive this vaccine at local pharmacy or Health Dept. Aware to provide a copy of the vaccination record if obtained from local pharmacy or  Health Dept. Verbalized acceptance and understanding.  Screening Tests Health Maintenance  Topic Date Due   DTaP/Tdap/Td (2 - Tdap) 06/22/2021   COVID-19 Vaccine (5 - 2023-24 season) 08/20/2022 (Originally 03/28/2022)   Medicare Annual Wellness (AWV)  08/05/2023   Pneumonia Vaccine 75+ Years old  Completed   INFLUENZA VACCINE  Completed   DEXA SCAN  Completed   Zoster Vaccines- Shingrix  Completed   HPV VACCINES  Aged Out    Health Maintenance Health Maintenance Due  Topic Date Due   DTaP/Tdap/Td (2 - Tdap) 06/22/2021   Lung Cancer Screening: (Low Dose CT Chest recommended if Age 37-80 years, 30 pack-year currently smoking OR have quit w/in 15years.) does not qualify.   Hepatitis C Screening: does not qualify.  Vision Screening: Recommended annual ophthalmology exams for early detection of glaucoma and other disorders of the eye.  Dental Screening: Recommended annual dental exams for proper oral hygiene  Community Resource Referral / Chronic Care Management: CRR required this visit?  No   CCM required this visit?  No      Plan:     I have personally reviewed and noted the following in the patient's chart:   Medical and social history Use of alcohol, tobacco or illicit drugs  Current medications and supplements including opioid prescriptions. Patient is not currently taking opioid prescriptions. Functional ability and status Nutritional status Physical activity Advanced directives List of other physicians Hospitalizations, surgeries, and ER visits in previous 12 months Vitals Screenings to include cognitive, depression, and falls Referrals and appointments  In addition, I have reviewed and discussed with patient certain preventive protocols, quality metrics, and best practice recommendations. A written personalized care plan for preventive services as well as general preventive health recommendations were provided to patient.     Leta Jungling, LPN   04/01/6386

## 2022-08-04 NOTE — Patient Instructions (Addendum)
Amy Morton , Thank you for taking time to come for your Medicare Wellness Visit. I appreciate your ongoing commitment to your health goals. Please review the following plan we discussed and let me know if I can assist you in the future.   These are the goals we discussed:  Goals      Follow up with Primary Care Provider     As needed.        This is a list of the screening recommended for you and due dates:  Health Maintenance  Topic Date Due   DTaP/Tdap/Td vaccine (2 - Tdap) 06/22/2021   COVID-19 Vaccine (5 - 2023-24 season) 08/20/2022*   Medicare Annual Wellness Visit  08/05/2023   Pneumonia Vaccine  Completed   Flu Shot  Completed   DEXA scan (bone density measurement)  Completed   Zoster (Shingles) Vaccine  Completed   HPV Vaccine  Aged Out  *Topic was postponed. The date shown is not the original due date.    Advanced directives: on file  Conditions/risks identified: none new  Next appointment: Follow up in one year for your annual wellness visit    Preventive Care 65 Years and Older, Female Preventive care refers to lifestyle choices and visits with your health care provider that can promote health and wellness. What does preventive care include? A yearly physical exam. This is also called an annual well check. Dental exams once or twice a year. Routine eye exams. Ask your health care provider how often you should have your eyes checked. Personal lifestyle choices, including: Daily care of your teeth and gums. Regular physical activity. Eating a healthy diet. Avoiding tobacco and drug use. Limiting alcohol use. Practicing safe sex. Taking low-dose aspirin every day. Taking vitamin and mineral supplements as recommended by your health care provider. What happens during an annual well check? The services and screenings done by your health care provider during your annual well check will depend on your age, overall health, lifestyle risk factors, and family  history of disease. Counseling  Your health care provider may ask you questions about your: Alcohol use. Tobacco use. Drug use. Emotional well-being. Home and relationship well-being. Sexual activity. Eating habits. History of falls. Memory and ability to understand (cognition). Work and work Statistician. Reproductive health. Screening  You may have the following tests or measurements: Height, weight, and BMI. Blood pressure. Lipid and cholesterol levels. These may be checked every 5 years, or more frequently if you are over 35 years old. Skin check. Lung cancer screening. You may have this screening every year starting at age 46 if you have a 30-pack-year history of smoking and currently smoke or have quit within the past 15 years. Fecal occult blood test (FOBT) of the stool. You may have this test every year starting at age 25. Flexible sigmoidoscopy or colonoscopy. You may have a sigmoidoscopy every 5 years or a colonoscopy every 10 years starting at age 39. Hepatitis C blood test. Hepatitis B blood test. Sexually transmitted disease (STD) testing. Diabetes screening. This is done by checking your blood sugar (glucose) after you have not eaten for a while (fasting). You may have this done every 1-3 years. Bone density scan. This is done to screen for osteoporosis. You may have this done starting at age 57. Mammogram. This may be done every 1-2 years. Talk to your health care provider about how often you should have regular mammograms. Talk with your health care provider about your test results, treatment options, and if  necessary, the need for more tests. Vaccines  Your health care provider may recommend certain vaccines, such as: Influenza vaccine. This is recommended every year. Tetanus, diphtheria, and acellular pertussis (Tdap, Td) vaccine. You may need a Td booster every 10 years. Zoster vaccine. You may need this after age 20. Pneumococcal 13-valent conjugate (PCV13)  vaccine. One dose is recommended after age 57. Pneumococcal polysaccharide (PPSV23) vaccine. One dose is recommended after age 27. Talk to your health care provider about which screenings and vaccines you need and how often you need them. This information is not intended to replace advice given to you by your health care provider. Make sure you discuss any questions you have with your health care provider. Document Released: 08/10/2015 Document Revised: 04/02/2016 Document Reviewed: 05/15/2015 Elsevier Interactive Patient Education  2017 [place] Prevention in the Home Falls can cause injuries. They can happen to people of all ages. There are many things you can do to make your home safe and to help prevent falls. What can I do on the outside of my home? Regularly fix the edges of walkways and driveways and fix any cracks. Remove anything that might make you trip as you walk through a door, such as a raised step or threshold. Trim any bushes or trees on the path to your home. Use bright outdoor lighting. Clear any walking paths of anything that might make someone trip, such as rocks or tools. Regularly check to see if handrails are loose or broken. Make sure that both sides of any steps have handrails. Any raised decks and porches should have guardrails on the edges. Have any leaves, snow, or ice cleared regularly. Use sand or salt on walking paths during winter. Clean up any spills in your garage right away. This includes oil or grease spills. What can I do in the bathroom? Use night lights. Install grab bars by the toilet and in the tub and shower. Do not use towel bars as grab bars. Use non-skid mats or decals in the tub or shower. If you need to sit down in the shower, use a plastic, non-slip stool. Keep the floor dry. Clean up any water that spills on the floor as soon as it happens. Remove soap buildup in the tub or shower regularly. Attach bath mats securely with  double-sided non-slip rug tape. Do not have throw rugs and other things on the floor that can make you trip. What can I do in the bedroom? Use night lights. Make sure that you have a light by your bed that is easy to reach. Do not use any sheets or blankets that are too big for your bed. They should not hang down onto the floor. Have a firm chair that has side arms. You can use this for support while you get dressed. Do not have throw rugs and other things on the floor that can make you trip. What can I do in the kitchen? Clean up any spills right away. Avoid walking on wet floors. Keep items that you use a lot in easy-to-reach places. If you need to reach something above you, use a strong step stool that has a grab bar. Keep electrical cords out of the way. Do not use floor polish or wax that makes floors slippery. If you must use wax, use non-skid floor wax. Do not have throw rugs and other things on the floor that can make you trip. What can I do with my stairs? Do not leave any items  on the stairs. Make sure that there are handrails on both sides of the stairs and use them. Fix handrails that are broken or loose. Make sure that handrails are as long as the stairways. Check any carpeting to make sure that it is firmly attached to the stairs. Fix any carpet that is loose or worn. Avoid having throw rugs at the top or bottom of the stairs. If you do have throw rugs, attach them to the floor with carpet tape. Make sure that you have a light switch at the top of the stairs and the bottom of the stairs. If you do not have them, ask someone to add them for you. What else can I do to help prevent falls? Wear shoes that: Do not have high heels. Have rubber bottoms. Are comfortable and fit you well. Are closed at the toe. Do not wear sandals. If you use a stepladder: Make sure that it is fully opened. Do not climb a closed stepladder. Make sure that both sides of the stepladder are locked  into place. Ask someone to hold it for you, if possible. Clearly mark and make sure that you can see: Any grab bars or handrails. First and last steps. Where the edge of each step is. Use tools that help you move around (mobility aids) if they are needed. These include: Canes. Walkers. Scooters. Crutches. Turn on the lights when you go into a dark area. Replace any light bulbs as soon as they burn out. Set up your furniture so you have a clear path. Avoid moving your furniture around. If any of your floors are uneven, fix them. If there are any pets around you, be aware of where they are. Review your medicines with your doctor. Some medicines can make you feel dizzy. This can increase your chance of falling. Ask your doctor what other things that you can do to help prevent falls. This information is not intended to replace advice given to you by your health care provider. Make sure you discuss any questions you have with your health care provider. Document Released: 05/10/2009 Document Revised: 12/20/2015 Document Reviewed: 08/18/2014 Elsevier Interactive Patient Education  2017 [employer].

## 2022-08-04 NOTE — Assessment & Plan Note (Signed)
Chronic issue.  Progressively worsening.  Discussed that it will continue to worsen though we will continue to monitor.

## 2022-08-16 ENCOUNTER — Other Ambulatory Visit: Payer: Self-pay | Admitting: Family Medicine

## 2022-09-02 ENCOUNTER — Ambulatory Visit: Payer: Medicare Other | Attending: Cardiovascular Disease | Admitting: Cardiovascular Disease

## 2022-09-02 ENCOUNTER — Encounter: Payer: Self-pay | Admitting: Cardiovascular Disease

## 2022-09-02 VITALS — BP 164/88 | HR 76 | Ht 59.0 in | Wt 118.1 lb

## 2022-09-02 DIAGNOSIS — E785 Hyperlipidemia, unspecified: Secondary | ICD-10-CM | POA: Diagnosis not present

## 2022-09-02 DIAGNOSIS — I10 Essential (primary) hypertension: Secondary | ICD-10-CM | POA: Diagnosis not present

## 2022-09-02 DIAGNOSIS — I2510 Atherosclerotic heart disease of native coronary artery without angina pectoris: Secondary | ICD-10-CM | POA: Diagnosis not present

## 2022-09-02 DIAGNOSIS — I350 Nonrheumatic aortic (valve) stenosis: Secondary | ICD-10-CM

## 2022-09-02 NOTE — Patient Instructions (Signed)
Medication Instructions:  No changes *If you need a refill on your cardiac medications before your next appointment, please call your pharmacy*   Lab Work: None ordered If you have labs (blood work) drawn today and your tests are completely normal, you will receive your results only by: MyChart Message (if you have MyChart) OR A paper copy in the mail If you have any lab test that is abnormal or we need to change your treatment, we will call you to review the results.   Testing/Procedures: None ordered   Follow-Up: At [facility], you and your health needs are our priority.  As part of our continuing mission to provide you with exceptional heart care, we have created designated Provider Care Teams.  These Care Teams include your primary Cardiologist (physician) and Advanced Practice Providers (APPs -  Physician Assistants and Nurse Practitioners) who all work together to provide you with the care you need, when you need it.  We recommend signing up for the patient portal called "MyChart".  Sign up information is provided on this After Visit Summary.  MyChart is used to connect with patients for Virtual Visits (Telemedicine).  Patients are able to view lab/test results, encounter notes, upcoming appointments, etc.  Non-urgent messages can be sent to your provider as well.   To learn more about what you can do with MyChart, go to [email or web address].    Your next appointment:   6 month(s)  Provider:   You may see Muhammad Arida, MD or one of the following Advanced Practice Providers on your designated Care Team:   Christopher Berge, NP Ryan Dunn, PA-C Cadence Furth, PA-C Sheri Hammock, NP    

## 2022-09-02 NOTE — Progress Notes (Signed)
Cardiology Office Note   Date:  09/02/2022   ID:  [name], Amy Morton 08-Dec-1934, MRN [id number]  PCP:  [name], MD  Cardiologist:   Kathlyn Sacramento, MD   Chief Complaint  Patient presents with   Other    3 month f/u no complaints today. Meds reviewed verbally with pt.      History of Present Illness: Amy Morton is a 88 y.o. female who presents for for a follow-up visit regarding coronary artery disease and moderate to severe aortic stenosis. Other medical problems include anemia, hypertension, hyperlipidemia, anxiety and depression. She had previous left circumflex PCI in 2016 with known occluded distal right coronary artery.  She had worsening anginal symptoms in 2021.  Right and left cardiac catheterization was performed in October 2021 which showed low filling pressures, normal pulmonary pressure, normal cardiac output and severe aortic stenosis with mean gradient of 30 mmHg and valve area of 0.82 square.  Coronary angiography showed patent stents in the left circumflex without significant restenosis, stable 70% stenosis in the mid LAD in a tortuous segment and known chronically occluded right coronary artery with left-to-right collaterals.  She was hospitalized in November, 2021 with TIA.  The patient was seen by our TAVR team.  She underwent PYP scan which was not consistent with TTR amyloid.  It was felt that her aortic stenosis was moderate and there was concern about the patient's memory and continued functional decline.  She underwent an echocardiogram in November which showed an EF of 60 to 65% with progression of aortic stenosis to the moderate/severe range with a mean gradient of 26 mmHg and valve area of 1.07 cm. She has advanced dementia with significant anxiety component.  She has been doing reasonably well and denies recent chest pain or shortness of breath.  No dizziness.   Past Medical History:  Diagnosis Date   (HFpEF) heart failure with  preserved ejection fraction ([place])    a. 08/2020 PET: Not suggestive of transthyretin amyloidosis; b. 11/2020 Echo: 60-65%, gr1 DD.   Abnormal mammogram 2007   Recommend close follow up   Anemia    Iron, b12, SPEP, UPEP normal, 12/2012   Anxiety    Aortic insufficiency    a. 06/2020 cMRI: mild AI.   Arthritis    Asthma    CAD (coronary artery disease)    a. 1996 s/p PTCA; b. 06/2011 Cath: LCX 50, RCA 100 CTO; c. 03/2015 PCI: LAD 70, LCX 9mw/ abnl FFR (3.0x12 Synergy & 2.5x8 Synergy DESs); d. 01/2018 Cath: stable anatomy, patent LCX stents; e. 04/2020 Cath: LM nl, LAD 40p, 631mD2 70ost, LCX 40ost, patent stents, RCA 70p/m, 100d. RPAV fills via R->R collats. RPL2 fills via L->R collats-->Med Rx.   Carotid arterial disease ([facility]   a. 05/2020 Carotid U/S: <50% bilat ICA stenoses.   Chronic cough    Depression    Diverticulosis    Dyspnea    Elevated LFTs 2010   s/p ultrasound w/ possible fatty liver; GI consult. Resolution 2011   Gastric ulcer    GERD (gastroesophageal reflux disease)    History of colonic polyps    [facility]    Hyperlipidemia    Hypertension    [facility]omat[facility]   Dr. ChOliva Bustard Dr. BrClaiborne Rigg/p chemo   PSVT (paroxysmal supraventricular tachycardia)    a. 06/2020 Zio: 52 episodes of SVT (longest 59 secs; fastest 159 bpm).  Severe aortic stenosis    a. 05/2020 Cor CTA: Tricuspid AoV w/ sev reduced cusp separation and Ca2+; b. 06/2020 cMRI: Sev AS w/ mild AI. Mod MR. EF 69% w/ sev asymmetric LVH. Nl RV fxn; c. 11/2020 Echo: EF 60-65%, no rwma, Gr1 DD. Nl RV size/fxn. Mild MR. Mod-Sev AS (appears severe visually) AVA 0.94cm^2.   Shingles 2008   T-9 distribution   Syncope    a. 06/2020 Zio: 7 beats WCT (NSVT). 52 episodes of SVT (longest 59 secs, fastest 159 bpm).   Vitamin D deficiency    Wheezing     Past Surgical History:  Procedure Laterality Date   ABDOMINAL HYSTERECTOMY  2001   APPENDECTOMY     CARDIAC  CATHETERIZATION  2007   50% mid LAD stenosis, 70% proximal RCA with 100% distal RCA stenosis with collaterals, EF 65%   CARDIAC CATHETERIZATION N/A 04/27/2015   Procedure: Left Heart Cath and Coronary Angiography;  Surgeon: Jettie Booze, MD;  Location: [place] [facility];  Service: Cardiovascular;  Laterality: N/A;   CARDIAC CATHETERIZATION N/A 04/27/2015   Procedure: Coronary Stent Intervention;  Surgeon: Jettie Booze, MD;  Location: [place] [facility];  Service: Cardiovascular;  Laterality: N/A;   CATARACT EXTRACTION W/PHACO Left 09/25/2016   Procedure: CATARACT EXTRACTION PHACO AND INTRAOCULAR LENS PLACEMENT (IOC);  Surgeon: Eulogio Bear, MD;  Location: [facility];  Service: Ophthalmology;  Laterality: Left;  [place] 46.7 AP% 8.1 CDE 3.80 Fluid pack lot # [id number]   CATARACT EXTRACTION W/PHACO Right 11/06/2016   Procedure: CATARACT EXTRACTION PHACO AND INTRAOCULAR LENS PLACEMENT (IOC);  Surgeon: Eulogio Bear, MD;  Location: [facility];  Service: Ophthalmology;  Laterality: Right;  [place] 49.5 AP8.7 CDE4.28 LOT # [id number]   CHOLECYSTECTOMY  1987   COLECTOMY  2001   CORONARY ANGIOPLASTY  1996   CORONARY ANGIOPLASTY     LAPAROSCOPIC BILATERAL SALPINGO OOPHERECTOMY  1988   Dr. Keeler CATH AND CORONARY ANGIOGRAPHY N/A 01/25/2018   Procedure: LEFT HEART CATH AND CORONARY ANGIOGRAPHY;  Surgeon: Wellington Hampshire, MD;  Location: [place] [facility];  Service: Cardiovascular;  Laterality: N/A;   RIGHT/LEFT HEART CATH AND CORONARY ANGIOGRAPHY N/A 05/16/2020   Procedure: RIGHT/LEFT HEART CATH AND CORONARY ANGIOGRAPHY;  Surgeon: Wellington Hampshire, MD;  Location: [place] [facility];  Service: Cardiovascular;  Laterality: N/A;   TONSILLECTOMY  1950     Current Outpatient Medications  Medication Sig Dispense Refill   acetaminophen (TYLENOL) 500 MG tablet Take 1,000 mg by mouth in the morning and at bedtime.     aspirin 81 MG tablet Take 81 mg by mouth daily.     atorvastatin  (LIPITOR) 40 MG tablet TAKE 1 TABLET BY MOUTH EVERY DAY 90 tablet 3   Calcium Carbonate-Vit D-Min (CALCIUM 1200 PO) Take 1,200 mg by mouth daily.     carvedilol (COREG) 6.25 MG tablet TAKE 1 TABLET(6.25 MG) BY MOUTH TWICE DAILY WITH A MEAL 60 tablet 5   Cholecalciferol 5000 units TABS Take 5,000 Units by mouth daily.     clonazePAM (KLONOPIN) 0.5 MG tablet Take 0.5 tablets (0.25 mg total) by mouth daily as needed for anxiety. 5 tablet 0   Cyanocobalamin (B-12 PO) Take by mouth daily.     isosorbide mononitrate (IMDUR) 30 MG 24 hr tablet TAKE 1 TABLET BY MOUTH DAILY 90 tablet 3   magnesium oxide (MAG-OX) 400 MG tablet Take 400 mg by mouth in the morning, at noon, and at bedtime.  melatonin 3 MG TABS tablet Take by mouth.     mirtazapine (REMERON) 15 MG tablet Take 2 tablets (30 mg total) by mouth at bedtime. 180 tablet 1   Multiple Vitamin (MULTIVITAMIN) capsule Take 1 capsule by mouth daily.     nitroGLYCERIN (NITROSTAT) 0.4 MG SL tablet PLACE 1 TABLET UNDER THE TONGUE EVERY 5 MINUTES AS NEEDED FOR CHEST PAIN, 3 DOSES MAX 25 tablet 1   Nutritional Supplements (OSTEO ADVANCE) TABS Take 1 tablet by mouth daily.      pantoprazole (PROTONIX) 40 MG tablet TAKE 1 TABLET(40 MG) BY MOUTH DAILY 90 tablet 0   potassium chloride SA (KLOR-CON M) 20 MEQ tablet TAKE 1 TABLET(20 MEQ) BY MOUTH DAILY 90 tablet 3   traZODone (DESYREL) 50 MG tablet Take 1 tablet (50 mg total) by mouth daily in the afternoon AND 1 tablet (50 mg total) at bedtime. 180 tablet 3   Ubiquinol 100 MG CAPS Take 100 mg by mouth every morning.     No current facility-administered medications for this visit.   Facility-Administered Medications Ordered in Other Visits  Medication Dose Route Frequency Provider Last Rate Last Admin   sodium chloride 0.9 % nebulizer solution 3 mL  3 mL Nebulization Once [name], MD       Followed by   methacholine (PROVOCHOLINE) inhaler solution 0.125 mg  2 mL Inhalation Once [name],  MD       Followed by   methacholine (PROVOCHOLINE) inhaler solution 0.5 mg  2 mL Inhalation Once [name], MD       Followed by   methacholine (PROVOCHOLINE) inhaler solution 2 mg  2 mL Inhalation Once [name], MD       Followed by   methacholine (PROVOCHOLINE) inhaler solution 8 mg  2 mL Inhalation Once [name], MD       Followed by   methacholine (PROVOCHOLINE) inhaler solution 32 mg  2 mL Inhalation Once [name], MD        Allergies:   Abilify [aripiprazole], Celexa [citalopram], and Ibuprofen    Social History:  The patient  reports that she has never smoked. She has never used smokeless tobacco. She reports that she does not drink alcohol and does not use drugs.   Family History:  The patient's family history includes Cancer in her father; Cancer (age of onset: 73) in her daughter; Diabetes in her brother and sister; Heart disease in her brother; Hypertension in her mother; Stroke (age of onset: 18) in her mother.    ROS:  Please see the history of present illness.   Otherwise, review of systems are positive for none.   All other systems are reviewed and negative.    PHYSICAL EXAM: VS:  BP (!) 160/90 (BP Location: Left Arm, Patient Position: Sitting, Cuff Size: Normal)   Pulse 76   Ht '4\' 11"'$  (1.499 m)   Wt 118 lb 2 oz (53.6 kg)   SpO2 96%   BMI 23.86 kg/m  , BMI Body mass index is 23.86 kg/m. GEN: Well nourished, well developed, in no acute distress  HEENT: normal  Neck: no JVD, carotid bruits, or masses Cardiac: RRR; no rubs, or gallops,no edema .  3/6 crescendo decrescendo systolic murmur in the aortic area which is mid to late peaking. Respiratory:  clear to auscultation bilaterally, normal work of breathing GI: soft, nontender, nondistended, + BS MS: no deformity or atrophy  Skin: warm and dry, no rash Neuro:  Strength and sensation are intact Psych: euthymic mood, full affect   EKG:  EKG is not ordered  today.    Recent Labs: 09/03/2021: ALT 14; Hemoglobin 12.0; Platelets 158.0 12/24/2021: BUN 13; Creatinine, Ser 0.80; Potassium 4.0; Sodium 133 01/14/2022: TSH 2.030    Lipid Panel    Component Value Date/Time   CHOL 116 09/03/2021 1222   CHOL 115 09/03/2017 1049   TRIG 159.0 (H) 09/03/2021 1222   HDL 50.80 09/03/2021 1222   HDL 50 09/03/2017 1049   CHOLHDL 2 09/03/2021 1222   VLDL 31.8 09/03/2021 1222   LDLCALC 33 09/03/2021 1222   LDLCALC 29 09/03/2017 1049   LDLDIRECT 35.0 02/21/2019 1012      Wt Readings from Last 3 Encounters:  09/02/22 118 lb 2 oz (53.6 kg)  08/04/22 117 lb (53.1 kg)  08/04/22 117 lb (53.1 kg)           No data to display            ASSESSMENT AND PLAN:  1.  Coronary artery disease involving native coronary arteries without angina: Most recent cardiac catheterization in October of 2021 showed stable coronary findings with chronically occluded RCA with left-to-right collaterals and patent left circumflex stents.  Recommend continuing medical therapy.    2.  Moderate to severe aortic stenosis: This was stable on most recent echocardiogram in November.  She reports no symptoms at this time.   She is not a candidate for TAVR given her age and dementia.   3.  Essential hypertension: Blood pressure continues to be elevated.  Will consider increasing carvedilol in the future if needed.  For now, I do not want to be aggressive with antihypertensive therapy considering underlying her aortic stenosis.  4.  Hyperlipidemia: Continue atorvastatin 40 mg once daily.  Most recent lipid profile showed an LDL of 33.    Disposition:    Follow-up in 6 months.  Signed,  Kathlyn Sacramento, MD  09/02/2022 2:07 PM    [place]

## 2022-09-11 ENCOUNTER — Other Ambulatory Visit: Payer: Self-pay | Admitting: Cardiovascular Disease

## 2022-09-18 ENCOUNTER — Telehealth: Payer: Self-pay

## 2022-09-18 ENCOUNTER — Telehealth: Payer: Self-pay | Admitting: Family Medicine

## 2022-09-19 NOTE — Telephone Encounter (Signed)
Death certificate completed.

## 2022-09-26 NOTE — Telephone Encounter (Signed)
Spoke with officer [place] whom was requesting for provider okay to release boy to funeral home. Officer [place] stated there was no signs of fowl play that was believed death due to natural causes. Reviewed cardiology last note, called PCP and advised of situation, received verbal okay to release body called officer Madison back at [phone] and advised of verbal okay.

## 2022-09-26 NOTE — Telephone Encounter (Signed)
Caller Name: [place] Phone #:   [phone]  Date of death: 2022/09/23 Place of death: Bedroom  Time of death: 10:30am            (Please indicate AM/PM)    Autopsy performed: unavailble Funeral Home: unavailable  Case # [id number]   [place] from the police department, called asking to provider to sign death certificate. Call was  transferred to Ms. Juliann Pulse

## 2022-09-26 NOTE — Telephone Encounter (Signed)
Dr. Volanda Napoleon sending to you since you are Doc of the day.  Please answer on Dr. Ellen Henri behalf.    Received a phone call from Donata Duff, [facility] concerning pt being found deceased today.  Eddie Dibbles reported that he believes pt passed away yesterday morning.  Was found in bed, no sign of forced entry, medications still in home. Will not be an medical examiner case.  Please see cardiology visit on 09/02/22, pt has an extensive cardiac history. They need to know if Dr. Caryl Bis is willing to sign death certificate.  Sending note to Dr. Volanda Napoleon since she is doc of the day.  Need to let [place] with [place] [employer] ASAP [phone].

## 2022-09-26 DEATH — deceased

## 2022-11-10 ENCOUNTER — Ambulatory Visit: Payer: Medicare Other | Admitting: Family Medicine
